# Patient Record
Sex: Male | Born: 1955 | Race: White | Hispanic: No | Marital: Married | Smoking: Current every day smoker
Health system: Southern US, Community
[De-identification: ages and names within clinical notes are randomized; demographics above are authoritative.]

## PROBLEM LIST (undated history)

## (undated) DIAGNOSIS — M1990 Unspecified osteoarthritis, unspecified site: Secondary | ICD-10-CM

## (undated) DIAGNOSIS — I10 Essential (primary) hypertension: Secondary | ICD-10-CM

## (undated) DIAGNOSIS — T7840XA Allergy, unspecified, initial encounter: Secondary | ICD-10-CM

## (undated) DIAGNOSIS — N200 Calculus of kidney: Secondary | ICD-10-CM

## (undated) DIAGNOSIS — E079 Disorder of thyroid, unspecified: Secondary | ICD-10-CM

## (undated) DIAGNOSIS — Z923 Personal history of irradiation: Secondary | ICD-10-CM

## (undated) DIAGNOSIS — C714 Malignant neoplasm of occipital lobe: Secondary | ICD-10-CM

## (undated) DIAGNOSIS — G4762 Sleep related leg cramps: Secondary | ICD-10-CM

## (undated) DIAGNOSIS — G4730 Sleep apnea, unspecified: Secondary | ICD-10-CM

## (undated) HISTORY — DX: Disorder of thyroid, unspecified: E07.9

## (undated) HISTORY — PX: BRAIN SURGERY: SHX531

## (undated) HISTORY — DX: Unspecified osteoarthritis, unspecified site: M19.90

## (undated) HISTORY — DX: Sleep related leg cramps: G47.62

## (undated) HISTORY — DX: Calculus of kidney: N20.0

## (undated) HISTORY — PX: TREATMENT FISTULA ANAL: SUR1390

## (undated) HISTORY — DX: Sleep apnea, unspecified: G47.30

## (undated) HISTORY — DX: Personal history of irradiation: Z92.3

## (undated) HISTORY — DX: Essential (primary) hypertension: I10

## (undated) HISTORY — PX: KNEE ARTHROSCOPY W/ MENISCAL REPAIR: SHX1877

## (undated) HISTORY — DX: Malignant neoplasm of occipital lobe: C71.4

## (undated) HISTORY — DX: Allergy, unspecified, initial encounter: T78.40XA

---

## 2012-01-04 ENCOUNTER — Ambulatory Visit (INDEPENDENT_AMBULATORY_CARE_PROVIDER_SITE_OTHER): Payer: Commercial Managed Care - PPO | Admitting: Family Medicine

## 2012-01-04 VITALS — BP 141/81 | HR 89 | Temp 98.9°F | Resp 18 | Ht 71.25 in | Wt 249.6 lb

## 2012-01-04 DIAGNOSIS — Z0000 Encounter for general adult medical examination without abnormal findings: Secondary | ICD-10-CM

## 2012-01-04 DIAGNOSIS — B353 Tinea pedis: Secondary | ICD-10-CM

## 2012-01-04 DIAGNOSIS — E039 Hypothyroidism, unspecified: Secondary | ICD-10-CM

## 2012-01-04 DIAGNOSIS — I10 Essential (primary) hypertension: Secondary | ICD-10-CM

## 2012-01-04 LAB — POCT CBC
Granulocyte percent: 57.6 %G (ref 37–80)
HCT, POC: 42.1 % — AB (ref 43.5–53.7)
Hemoglobin: 13.6 g/dL — AB (ref 14.1–18.1)
Lymph, poc: 2.2 (ref 0.6–3.4)
MCH, POC: 28.9 pg (ref 27–31.2)
MCHC: 32.3 g/dL (ref 31.8–35.4)
MCV: 89.3 fL (ref 80–97)
MID (cbc): 0.5 (ref 0–0.9)
MPV: 8.2 fL (ref 0–99.8)
POC Granulocyte: 3.6 (ref 2–6.9)
POC LYMPH PERCENT: 35.1 %L (ref 10–50)
POC MID %: 7.3 %M (ref 0–12)
Platelet Count, POC: 204 10*3/uL (ref 142–424)
RBC: 4.71 M/uL (ref 4.69–6.13)
RDW, POC: 13.9 %
WBC: 6.2 10*3/uL (ref 4.6–10.2)

## 2012-01-04 LAB — COMPREHENSIVE METABOLIC PANEL
ALT: 23 U/L (ref 0–53)
AST: 28 U/L (ref 0–37)
Albumin: 4.4 g/dL (ref 3.5–5.2)
Alkaline Phosphatase: 64 U/L (ref 39–117)
BUN: 17 mg/dL (ref 6–23)
CO2: 27 mEq/L (ref 19–32)
Calcium: 9.5 mg/dL (ref 8.4–10.5)
Chloride: 102 mEq/L (ref 96–112)
Creat: 1.03 mg/dL (ref 0.50–1.35)
Glucose, Bld: 95 mg/dL (ref 70–99)
Potassium: 4.3 mEq/L (ref 3.5–5.3)
Sodium: 136 mEq/L (ref 135–145)
Total Bilirubin: 0.5 mg/dL (ref 0.3–1.2)
Total Protein: 6.8 g/dL (ref 6.0–8.3)

## 2012-01-04 LAB — LIPID PANEL
Cholesterol: 144 mg/dL (ref 0–200)
HDL: 46 mg/dL (ref >39)
LDL Cholesterol: 76 mg/dL (ref 0–99)
Total CHOL/HDL Ratio: 3.1 Ratio
Triglycerides: 111 mg/dL (ref <150)
VLDL: 22 mg/dL (ref 0–40)

## 2012-01-04 LAB — TSH: TSH: 2.223 u[IU]/mL (ref 0.350–4.500)

## 2012-01-04 MED ORDER — LISINOPRIL 40 MG PO TABS: 40.0000 mg | ORAL_TABLET | Freq: Every day | ORAL | Status: DC

## 2012-01-04 MED ORDER — LEVOTHYROXINE SODIUM 50 MCG PO TABS: 50.0000 ug | ORAL_TABLET | Freq: Every day | ORAL | Status: DC

## 2012-01-04 MED ORDER — VERAPAMIL HCL 180 MG (CO) PO TB24: 180.0000 mg | ORAL_TABLET | Freq: Every day | ORAL | Status: DC

## 2012-01-04 MED ORDER — TERBINAFINE HCL 250 MG PO TABS: 250.0000 mg | ORAL_TABLET | Freq: Every day | ORAL | Status: DC

## 2012-01-04 NOTE — Progress Notes (Signed)
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  Wife has same problem and has started lamisel.  Working 50 hours a week still.  Last Tdap was 12/25/10  O:  Alert, NAD

## 2012-01-04 NOTE — Patient Instructions (Addendum)
Dentist:  Loni Beckwith  [phone] Colonoscopy:  Jeani Hawking, MD   Athlete's Foot Athlete's foot (tinea pedis) is a fungal infection of the skin on the feet. It often occurs on the skin between the toes or underneath the toes. It can also occur on the soles of the feet. Athlete's foot is more likely to occur in hot, humid weather. Not washing your feet or changing your socks often enough can contribute to athlete's foot. The infection can spread from person to person (contagious). CAUSES Athlete's foot is caused by a fungus. This fungus thrives in warm, moist places. Most people get athlete's foot by sharing shower stalls, towels, and wet floors with an infected person. People with weakened immune systems, including those with diabetes, may be more likely to get athlete's foot. SYMPTOMS   Itchy areas between the toes or on the soles of the feet.   White, flaky, or scaly areas between the toes or on the soles of the feet.   Tiny, intensely itchy blisters between the toes or on the soles of the feet.   Tiny cuts on the skin. These cuts can develop a bacterial infection.   Thick or discolored toenails.  DIAGNOSIS  Your caregiver can usually tell what the problem is by doing a physical exam. Your caregiver may also take a skin sample from the rash area. The skin sample may be examined under a microscope, or it may be tested to see if fungus will grow in the sample. A sample may also be taken from your toenail for testing. TREATMENT  Over-the-counter and prescription medicines can be used to kill the fungus. These medicines are available as powders or creams. Your caregiver can suggest medicines for you. Fungal infections respond slowly to treatment. You may need to continue using your medicine for several weeks. PREVENTION   Do not share towels.   Wear sandals in wet areas, such as shared locker rooms and shared showers.   Keep your feet dry. Wear shoes that allow air to circulate. Wear cotton  or wool socks.  HOME CARE INSTRUCTIONS   Take medicines as directed by your caregiver. Do not use steroid creams on athlete's foot.   Keep your feet clean and cool. Wash your feet daily and dry them thoroughly, especially between your toes.   Change your socks every day. Wear cotton or wool socks. In hot climates, you may need to change your socks 2 to 3 times per day.   Wear sandals or canvas tennis shoes with good air circulation.   If you have blisters, soak your feet in Burow's solution or Epsom salts for 20 to 30 minutes, 2 times a day to dry out the blisters. Make sure you dry your feet thoroughly afterward.  SEEK MEDICAL CARE IF:   You have a fever.   You have swelling, soreness, warmth, or redness in your foot.   You are not getting better after 7 days of treatment.   You are not completely cured after 30 days.   You have any problems caused by your medicines.  MAKE SURE YOU:   Understand these instructions.   Will watch your condition.   Will get help right away if you are not doing well or get worse.  Document Released: 08/16/2000 Document Revised: 08/08/2011 Document Reviewed: 06/07/2011 [facility] Patient Information 2012 [place], [place].Smoking Cessation This document explains the best ways for you to quit smoking and new treatments to help. It lists new medicines that can double or triple your  chances of quitting and quitting for good. It also considers ways to avoid relapses and concerns you may have about quitting, including weight gain. NICOTINE: A POWERFUL ADDICTION If you have tried to quit smoking, you know how hard it can be. It is hard because nicotine is a very addictive drug. For some people, it can be as addictive as heroin or cocaine. Usually, people make 2 or 3 tries, or more, before finally being able to quit. Each time you try to quit, you can learn about what helps and what hurts. Quitting takes hard work and a lot of effort, but you can quit  smoking. QUITTING SMOKING IS ONE OF THE MOST IMPORTANT THINGS YOU WILL EVER DO.  You will live longer, feel better, and live better.   The impact on your body of quitting smoking is felt almost immediately:   Within 20 minutes, blood pressure decreases. Pulse returns to its normal level.   After 8 hours, carbon monoxide levels in the blood return to normal. Oxygen level increases.   After 24 hours, chance of heart attack starts to decrease. Breath, hair, and body stop smelling like smoke.   After 48 hours, damaged nerve endings begin to recover. Sense of taste and smell improve.   After 72 hours, the body is virtually free of nicotine. Bronchial tubes relax and breathing becomes easier.   After 2 to 12 weeks, lungs can hold more air. Exercise becomes easier and circulation improves.   Quitting will reduce your risk of having a heart attack, stroke, cancer, or lung disease:   After 1 year, the risk of coronary heart disease is cut in half.   After 5 years, the risk of stroke falls to the same as a nonsmoker.   After 10 years, the risk of lung cancer is cut in half and the risk of other cancers decreases significantly.   After 15 years, the risk of coronary heart disease drops, usually to the level of a nonsmoker.   If you are pregnant, quitting smoking will improve your chances of having a healthy baby.   The people you live with, especially your children, will be healthier.   You will have extra money to spend on things other than cigarettes.  FIVE KEYS TO QUITTING Studies have shown that these 5 steps will help you quit smoking and quit for good. You have the best chances of quitting if you use them together: 1. Get ready.  2. Get support and encouragement.  3. Learn new skills and behaviors.  4. Get medicine to reduce your nicotine addiction and use it correctly.  5. Be prepared for relapse or difficult situations. Be determined to continue trying to quit, even if you do not  succeed at first.  1. GET READY  Set a quit date.   Change your environment.   Get rid of ALL cigarettes, ashtrays, matches, and lighters in your home, car, and place of work.   Do not let people smoke in your home.   Review your past attempts to quit. Think about what worked and what did not.   Once you quit, do not smoke. NOT EVEN A PUFF!  2. GET SUPPORT AND ENCOURAGEMENT Studies have shown that you have a better chance of being successful if you have help. You can get support in many ways.  Tell your family, friends, and coworkers that you are going to quit and need their support. Ask them not to smoke around you.   Talk to your  caregivers (doctor, dentist, nurse, pharmacist, psychologist, and/or smoking counselor).   Get individual, group, or telephone counseling and support. The more counseling you have, the better your chances are of quitting. Programs are available at [employer] and health centers. Call your local health department for information about programs in your area.   Spiritual beliefs and practices may help some smokers quit.   Quit meters are Photographer that keep track of quit statistics, such as amount of "quit-time," cigarettes not smoked, and money saved.   Many smokers find one or more of the many self-help books available useful in helping them quit and stay off tobacco.  3. LEARN NEW SKILLS AND BEHAVIORS  Try to distract yourself from urges to smoke. Talk to someone, go for a walk, or occupy your time with a task.   When you first try to quit, change your routine. Take a different route to work. Drink tea instead of coffee. Eat breakfast in a different place.   Do something to reduce your stress. Take a hot bath, exercise, or read a book.   Plan something enjoyable to do every day. Reward yourself for not smoking.   Explore interactive web-based programs that specialize in helping you quit.  4. GET MEDICINE AND  USE IT CORRECTLY Medicines can help you stop smoking and decrease the urge to smoke. Combining medicine with the above behavioral methods and support can quadruple your chances of successfully quitting smoking. The [place] Food and Drug Administration (FDA) has approved 7 medicines to help you quit smoking. These medicines fall into 3 categories.  Nicotine replacement therapy (delivers nicotine to your body without the negative effects and risks of smoking):   Nicotine gum: Available over-the-counter.   Nicotine lozenges: Available over-the-counter.   Nicotine inhaler: Available by prescription.   Nicotine nasal spray: Available by prescription.   Nicotine skin patches (transdermal): Available by prescription and over-the-counter.   Antidepressant medicine (helps people abstain from smoking, but how this works is unknown):   Bupropion sustained-release (SR) tablets: Available by prescription.   Nicotinic receptor partial agonist (simulates the effect of nicotine in your brain):   Varenicline tartrate tablets: Available by prescription.   Ask your caregiver for advice about which medicines to use and how to use them. Carefully read the information on the package.   Everyone who is trying to quit may benefit from using a medicine. If you are pregnant or trying to become pregnant, nursing an infant, you are under age 64, or you smoke fewer than 10 cigarettes per day, talk to your caregiver before taking any nicotine replacement medicines.   You should stop using a nicotine replacement product and call your caregiver if you experience nausea, dizziness, weakness, vomiting, fast or irregular heartbeat, mouth problems with the lozenge or gum, or redness or swelling of the skin around the patch that does not go away.   Do not use any other product containing nicotine while using a nicotine replacement product.   Talk to your caregiver before using these products if you have diabetes, heart  disease, asthma, stomach ulcers, you had a recent heart attack, you have high blood pressure that is not controlled with medicine, a history of irregular heartbeat, or you have been prescribed medicine to help you quit smoking.  5. BE PREPARED FOR RELAPSE OR DIFFICULT SITUATIONS  Most relapses occur within the first 3 months after quitting. Do not be discouraged if you start smoking again. Remember, most people try several  times before they finally quit.   You may have symptoms of withdrawal because your body is used to nicotine. You may crave cigarettes, be irritable, feel very hungry, cough often, get headaches, or have difficulty concentrating.   The withdrawal symptoms are only temporary. They are strongest when you first quit, but they will go away within 10 to 14 days.  Here are some difficult situations to watch for:  Alcohol. Avoid drinking alcohol. Drinking lowers your chances of successfully quitting.   Caffeine. Try to reduce the amount of caffeine you consume. It also lowers your chances of successfully quitting.   Other smokers. Being around smoking can make you want to smoke. Avoid smokers.   Weight gain. Many smokers will gain weight when they quit, usually less than 10 pounds. Eat a healthy diet and stay active. Do not let weight gain distract you from your main goal, quitting smoking. Some medicines that help you quit smoking may also help delay weight gain. You can always lose the weight gained after you quit.   Bad mood or depression. There are a lot of ways to improve your mood other than smoking.  If you are having problems with any of these situations, talk to your caregiver. SPECIAL SITUATIONS AND CONDITIONS Studies suggest that everyone can quit smoking. Your situation or condition can give you a special reason to quit.  Pregnant women/new mothers: By quitting, you protect your baby's health and your own.   Hospitalized patients: By quitting, you reduce health  problems and help healing.   Heart attack patients: By quitting, you reduce your risk of a second heart attack.   Lung, head, and neck cancer patients: By quitting, you reduce your chance of a second cancer.   Parents of children and adolescents: By quitting, you protect your children from illnesses caused by secondhand smoke.  QUESTIONS TO THINK ABOUT Think about the following questions before you try to stop smoking. You may want to talk about your answers with your caregiver.  Why do you want to quit?   If you tried to quit in the past, what helped and what did not?   What will be the most difficult situations for you after you quit? How will you plan to handle them?   Who can help you through the tough times? Your family? Friends? Caregiver?   What pleasures do you get from smoking? What ways can you still get pleasure if you quit?  Here are some questions to ask your caregiver:  How can you help me to be successful at quitting?   What medicine do you think would be best for me and how should I take it?   What should I do if I need more help?   What is smoking withdrawal like? How can I get information on withdrawal?  Quitting takes hard work and a lot of effort, but you can quit smoking. FOR MORE INFORMATION  Smokefree.gov (http://www.davis-sullivan.com/) provides free, accurate, evidence-based information and professional assistance to help support the immediate and long-term needs of people trying to quit smoking. Document Released: 08/13/2001 Document Revised: 08/08/2011 Document Reviewed: 06/05/2009 [facility] Patient Information 2012 [place], [place].

## 2012-01-05 ENCOUNTER — Encounter: Payer: Self-pay | Admitting: *Deleted

## 2012-04-18 ENCOUNTER — Other Ambulatory Visit: Payer: Self-pay | Admitting: Family Medicine

## 2012-12-31 ENCOUNTER — Other Ambulatory Visit: Payer: Self-pay | Admitting: Family Medicine

## 2012-12-31 NOTE — Telephone Encounter (Signed)
Pt's wife [name] is calling to give [place] heads up that pharmacy is going to be faxing or they did fax a request for refill on his medication.  She said that he has one pill left I believe for his BP medication Call back number is hers [phone] There is no current HIPPA form

## 2013-01-16 ENCOUNTER — Other Ambulatory Visit: Payer: Self-pay | Admitting: Family Medicine

## 2013-01-31 ENCOUNTER — Ambulatory Visit (INDEPENDENT_AMBULATORY_CARE_PROVIDER_SITE_OTHER): Payer: Commercial Managed Care - PPO | Admitting: Family Medicine

## 2013-01-31 VITALS — BP 130/77 | HR 91 | Temp 98.3°F | Resp 16 | Ht 72.0 in | Wt 240.0 lb

## 2013-01-31 DIAGNOSIS — I10 Essential (primary) hypertension: Secondary | ICD-10-CM

## 2013-01-31 DIAGNOSIS — E039 Hypothyroidism, unspecified: Secondary | ICD-10-CM

## 2013-01-31 DIAGNOSIS — Z720 Tobacco use: Secondary | ICD-10-CM | POA: Insufficient documentation

## 2013-01-31 DIAGNOSIS — Z79899 Other long term (current) drug therapy: Secondary | ICD-10-CM

## 2013-01-31 DIAGNOSIS — D649 Anemia, unspecified: Secondary | ICD-10-CM

## 2013-01-31 DIAGNOSIS — F17200 Nicotine dependence, unspecified, uncomplicated: Secondary | ICD-10-CM

## 2013-01-31 DIAGNOSIS — E669 Obesity, unspecified: Secondary | ICD-10-CM

## 2013-01-31 LAB — COMPREHENSIVE METABOLIC PANEL
Albumin: 4 g/dL (ref 3.5–5.2)
Alkaline Phosphatase: 58 U/L (ref 39–117)
BUN: 12 mg/dL (ref 6–23)
CO2: 22 mEq/L (ref 19–32)
Calcium: 9.4 mg/dL (ref 8.4–10.5)
Glucose, Bld: 94 mg/dL (ref 70–99)
Potassium: 4.3 mEq/L (ref 3.5–5.3)
Total Protein: 6.2 g/dL (ref 6.0–8.3)

## 2013-01-31 LAB — POCT CBC
Granulocyte percent: 57.6 %G (ref 37–80)
HCT, POC: 42.1 % — AB (ref 43.5–53.7)
Hemoglobin: 13.1 g/dL — AB (ref 14.1–18.1)
MPV: 9.1 fL (ref 0–99.8)
POC Granulocyte: 2.9 (ref 2–6.9)
POC MID %: 8 %M (ref 0–12)
RDW, POC: 14.2 %

## 2013-01-31 LAB — LIPID PANEL
Cholesterol: 141 mg/dL (ref 0–200)
HDL: 39 mg/dL — ABNORMAL LOW (ref >39)
Total CHOL/HDL Ratio: 3.6 Ratio

## 2013-01-31 LAB — VITAMIN B12: Vitamin B-12: 447 pg/mL (ref 211–911)

## 2013-01-31 LAB — TSH: TSH: 1.902 u[IU]/mL (ref 0.350–4.500)

## 2013-01-31 MED ORDER — LISINOPRIL 40 MG PO TABS: ORAL_TABLET | ORAL | Status: DC

## 2013-01-31 MED ORDER — VERAPAMIL HCL 180 MG (CO) PO TB24: 180.0000 mg | ORAL_TABLET | Freq: Every day | ORAL | Status: DC

## 2013-01-31 MED ORDER — LEVOTHYROXINE SODIUM 50 MCG PO TABS: ORAL_TABLET | ORAL | Status: DC

## 2013-01-31 NOTE — Patient Instructions (Signed)
Smoking Cessation Quitting smoking is important to your health and has many advantages. However, it is not always easy to quit since nicotine is a very addictive drug. Often times, people try 3 times or more before being able to quit. This document explains the best ways for you to prepare to quit smoking. Quitting takes hard work and a lot of effort, but you can do it. ADVANTAGES OF QUITTING SMOKING  You will live longer, feel better, and live better.  Your body will feel the impact of quitting smoking almost immediately.  Within 20 minutes, blood pressure decreases. Your pulse returns to its normal level.  After 8 hours, carbon monoxide levels in the blood return to normal. Your oxygen level increases.  After 24 hours, the chance of having a heart attack starts to decrease. Your breath, hair, and body stop smelling like smoke.  After 48 hours, damaged nerve endings begin to recover. Your sense of taste and smell improve.  After 72 hours, the body is virtually free of nicotine. Your bronchial tubes relax and breathing becomes easier.  After 2 to 12 weeks, lungs can hold more air. Exercise becomes easier and circulation improves.  The risk of having a heart attack, stroke, cancer, or lung disease is greatly reduced.  After 1 year, the risk of coronary heart disease is cut in half.  After 5 years, the risk of stroke falls to the same as a nonsmoker.  After 10 years, the risk of lung cancer is cut in half and the risk of other cancers decreases significantly.  After 15 years, the risk of coronary heart disease drops, usually to the level of a nonsmoker.  If you are pregnant, quitting smoking will improve your chances of having a healthy baby.  The people you live with, especially any children, will be healthier.  You will have extra money to spend on things other than cigarettes. QUESTIONS TO THINK ABOUT BEFORE ATTEMPTING TO QUIT You may want to talk about your answers with your  caregiver.  Why do you want to quit?  If you tried to quit in the past, what helped and what did not?  What will be the most difficult situations for you after you quit? How will you plan to handle them?  Who can help you through the tough times? Your family? Friends? A caregiver?  What pleasures do you get from smoking? What ways can you still get pleasure if you quit? Here are some questions to ask your caregiver:  How can you help me to be successful at quitting?  What medicine do you think would be best for me and how should I take it?  What should I do if I need more help?  What is smoking withdrawal like? How can I get information on withdrawal? GET READY  Set a quit date.  Change your environment by getting rid of all cigarettes, ashtrays, matches, and lighters in your home, car, or work. Do not let people smoke in your home.  Review your past attempts to quit. Think about what worked and what did not. GET SUPPORT AND ENCOURAGEMENT You have a better chance of being successful if you have help. You can get support in many ways.  Tell your family, friends, and co-workers that you are going to quit and need their support. Ask them not to smoke around you.  Get individual, group, or telephone counseling and support. Programs are available at local hospitals and health centers. Call your local health department for   information about programs in your area.  Spiritual beliefs and practices may help some smokers quit.  Download a "quit meter" on your computer to keep track of quit statistics, such as how long you have gone without smoking, cigarettes not smoked, and money saved.  Get a self-help book about quitting smoking and staying off of tobacco. LEARN NEW SKILLS AND BEHAVIORS  Distract yourself from urges to smoke. Talk to someone, go for a walk, or occupy your time with a task.  Change your normal routine. Take a different route to work. Drink tea instead of coffee.  Eat breakfast in a different place.  Reduce your stress. Take a hot bath, exercise, or read a book.  Plan something enjoyable to do every day. Reward yourself for not smoking.  Explore interactive web-based programs that specialize in helping you quit. GET MEDICINE AND USE IT CORRECTLY Medicines can help you stop smoking and decrease the urge to smoke. Combining medicine with the above behavioral methods and support can greatly increase your chances of successfully quitting smoking.  Nicotine replacement therapy helps deliver nicotine to your body without the negative effects and risks of smoking. Nicotine replacement therapy includes nicotine gum, lozenges, inhalers, nasal sprays, and skin patches. Some may be available over-the-counter and others require a prescription.  Antidepressant medicine helps people abstain from smoking, but how this works is unknown. This medicine is available by prescription.  Nicotinic receptor partial agonist medicine simulates the effect of nicotine in your brain. This medicine is available by prescription. Ask your caregiver for advice about which medicines to use and how to use them based on your health history. Your caregiver will tell you what side effects to look out for if you choose to be on a medicine or therapy. Carefully read the information on the package. Do not use any other product containing nicotine while using a nicotine replacement product.  RELAPSE OR DIFFICULT SITUATIONS Most relapses occur within the first 3 months after quitting. Do not be discouraged if you start smoking again. Remember, most people try several times before finally quitting. You may have symptoms of withdrawal because your body is used to nicotine. You may crave cigarettes, be irritable, feel very hungry, cough often, get headaches, or have difficulty concentrating. The withdrawal symptoms are only temporary. They are strongest when you first quit, but they will go away within  10 14 days. To reduce the chances of relapse, try to:  Avoid drinking alcohol. Drinking lowers your chances of successfully quitting.  Reduce the amount of caffeine you consume. Once you quit smoking, the amount of caffeine in your body increases and can give you symptoms, such as a rapid heartbeat, sweating, and anxiety.  Avoid smokers because they can make you want to smoke.  Do not let weight gain distract you. Many smokers will gain weight when they quit, usually less than 10 pounds. Eat a healthy diet and stay active. You can always lose the weight gained after you quit.  Find ways to improve your mood other than smoking. FOR MORE INFORMATION  www.smokefree.gov  Document Released: 08/13/2001 Document Revised: 02/18/2012 Document Reviewed: 11/28/2011 [facility] Patient Information 2014 [place], [place]. Smoking Cessation, Tips for Success YOU CAN QUIT SMOKING If you are ready to quit smoking, congratulations! You have chosen to help yourself be healthier. Cigarettes bring nicotine, tar, carbon monoxide, and other irritants into your body. Your lungs, heart, and blood vessels will be able to work better without these poisons. There are many different ways to  quit smoking. Nicotine gum, nicotine patches, a nicotine inhaler, or nicotine nasal spray can help with physical craving. Hypnosis, support groups, and medicines help break the habit of smoking. Here are some tips to help you quit for good.  Throw away all cigarettes.  Clean and remove all ashtrays from your home, work, and car.  On a card, write down your reasons for quitting. Carry the card with you and read it when you get the urge to smoke.  Cleanse your body of nicotine. Drink enough water and fluids to keep your urine clear or pale yellow. Do this after quitting to flush the nicotine from your body.  Learn to predict your moods. Do not let a bad situation be your excuse to have a cigarette. Some situations in your life might  tempt you into wanting a cigarette.  Never have "just one" cigarette. It leads to wanting another and another. Remind yourself of your decision to quit.  Change habits associated with smoking. If you smoked while driving or when feeling stressed, try other activities to replace smoking. Stand up when drinking your coffee. Brush your teeth after eating. Sit in a different chair when you read the paper. Avoid alcohol while trying to quit, and try to drink fewer caffeinated beverages. Alcohol and caffeine may urge you to smoke.  Avoid foods and drinks that can trigger a desire to smoke, such as sugary or spicy foods and alcohol.  Ask people who smoke not to smoke around you.  Have something planned to do right after eating or having a cup of coffee. Take a walk or exercise to perk you up. This will help to keep you from overeating.  Try a relaxation exercise to calm you down and decrease your stress. Remember, you may be tense and nervous for the first 2 weeks after you quit, but this will pass.  Find new activities to keep your hands busy. Play with a pen, coin, or rubber band. Doodle or draw things on paper.  Brush your teeth right after eating. This will help cut down on the craving for the taste of tobacco after meals. You can try mouthwash, too.  Use oral substitutes, such as lemon drops, carrots, a cinnamon stick, or chewing gum, in place of cigarettes. Keep them handy so they are available when you have the urge to smoke.  When you have the urge to smoke, try deep breathing.  Designate your home as a nonsmoking area.  If you are a heavy smoker, ask your caregiver about a prescription for nicotine chewing gum. It can ease your withdrawal from nicotine.  Reward yourself. Set aside the cigarette money you save and buy yourself something nice.  Look for support from others. Join a support group or smoking cessation program. Ask someone at home or at work to help you with your plan to quit  smoking.  Always ask yourself, "Do I need this cigarette or is this just a reflex?" Tell yourself, "Today, I choose not to smoke," or "I do not want to smoke." You are reminding yourself of your decision to quit, even if you do smoke a cigarette. HOW WILL I FEEL WHEN I QUIT SMOKING?  The benefits of not smoking start within days of quitting.  You may have symptoms of withdrawal because your body is used to nicotine (the addictive substance in cigarettes). You may crave cigarettes, be irritable, feel very hungry, cough often, get headaches, or have difficulty concentrating.  The withdrawal symptoms are only temporary.  They are strongest when you first quit but will go away within 10 to 14 days.  When withdrawal symptoms occur, stay in control. Think about your reasons for quitting. Remind yourself that these are signs that your body is healing and getting used to being without cigarettes.  Remember that withdrawal symptoms are easier to treat than the major diseases that smoking can cause.  Even after the withdrawal is over, expect periodic urges to smoke. However, these cravings are generally short-lived and will go away whether you smoke or not. Do not smoke!  If you relapse and smoke again, do not lose hope. Most smokers quit 3 times before they are successful.  If you relapse, do not give up! Plan ahead and think about what you will do the next time you get the urge to smoke. LIFE AS A NONSMOKER: MAKE IT FOR A MONTH, MAKE IT FOR LIFE Day 1: Hang this page where you will see it every day. Day 2: Get rid of all ashtrays, matches, and lighters. Day 3: Drink water. Breathe deeply between sips. Day 4: Avoid places with smoke-filled air, such as bars, clubs, or the smoking section of restaurants. Day 5: Keep track of how much money you save by not smoking. Day 6: Avoid boredom. Keep a good book with you or go to the movies. Day 7: Reward yourself! One week without smoking! Day 8: Make a  dental appointment to get your teeth cleaned. Day 9: Decide how you will turn down a cigarette before it is offered to you. Day 10: Review your reasons for quitting. Day 11: Distract yourself. Stay active to keep your mind off smoking and to relieve tension. Take a walk, exercise, read a book, do a crossword puzzle, or try a new hobby. Day 12: Exercise. Get off the bus before your stop or use stairs instead of escalators. Day 13: Call on friends for support and encouragement. Day 14: Reward yourself! Two weeks without smoking! Day 15: Practice deep breathing exercises. Day 16: Bet a friend that you can stay a nonsmoker. Day 17: Ask to sit in nonsmoking sections of restaurants. Day 18: Hang up "No Smoking" signs. Day 19: Think of yourself as a nonsmoker. Day 20: Each morning, tell yourself you will not smoke. Day 21: Reward yourself! Three weeks without smoking! Day 22: Think of smoking in negative ways. Remember how it stains your teeth, gives you bad breath, and leaves you short of breath. Day 23: Eat a nutritious breakfast. Day 24:Do not relive your days as a smoker. Day 25: Hold a pencil in your hand when talking on the telephone. Day 26: Tell all your friends you do not smoke. Day 27: Think about how much better food tastes. Day 28: Remember, one cigarette is one too many. Day 29: Take up a hobby that will keep your hands busy. Day 30: Congratulations! One month without smoking! Give yourself a big reward. Your caregiver can direct you to community resources or hospitals for support, which may include:  Group support.  Education.  Hypnosis.  Subliminal therapy. Document Released: 05/17/2004 Document Revised: 11/11/2011 Document Reviewed: 06/05/2009 [facility] Patient Information 2014 [place], [place].

## 2013-01-31 NOTE — Progress Notes (Signed)
Subjective:    Patient ID: Jake [name], male    DOB: [birth year], 57 y.o.   MRN: [id number] Chief Complaint  Patient presents with  . Medication Refill    HPI   Was just on vacation - no water or electricity in a tent x 6d in [place].    Still smoking about 1 ppd - has cut back by 1/2 ppd over the yr so keeping going to ease down - is not addicted to the cigarettes - its the nicotine that he likes - doesn't have any problems not smoking in work.  Has not tried nicotine replacement yet but is open to it.  Checks BP at home about once a month and usually about 130/70s.  Past Medical History  Diagnosis Date  . Thyroid disease   . Hypertension   . Allergy    Current Outpatient Prescriptions on File Prior to Visit  Medication Sig Dispense Refill  . aspirin 81 MG chewable tablet Chew 81 mg by mouth daily.       No current facility-administered medications on file prior to visit.   No Known Allergies    Review of Systems  Constitutional: Negative for fever and chills.  Eyes: Negative for visual disturbance.  Respiratory: Negative for shortness of breath.   Cardiovascular: Negative for chest pain and leg swelling.  Neurological: Negative for dizziness, syncope, facial asymmetry, weakness, light-headedness and headaches.      BP 130/77  Pulse 91  Temp(Src) 98.3 F (36.8 C)  Resp 16  Ht 6' (1.829 m)  Wt 240 lb (108.863 kg)  BMI 32.54 kg/m2 Objective:   Physical Exam  Constitutional: He is oriented to person, place, and time. He appears well-developed and well-nourished. No distress.  HENT:  Head: Normocephalic and atraumatic.  Eyes: Conjunctivae are normal. Pupils are equal, round, and reactive to light. No scleral icterus.  Neck: Normal range of motion. Neck supple. No thyromegaly present.  Cardiovascular: Normal rate, regular rhythm, normal heart sounds and intact distal pulses.   Pulmonary/Chest: Effort normal and breath sounds normal. No respiratory distress.   Musculoskeletal: He exhibits no edema.  Lymphadenopathy:    He has no cervical adenopathy.  Neurological: He is alert and oriented to person, place, and time.  Skin: Skin is warm and dry. He is not diaphoretic.  Psychiatric: He has a normal mood and affect. His behavior is normal.      Results for orders placed in visit on 01/31/13  POCT CBC      Result Value Range   WBC 5.0  4.6 - 10.2 K/uL   Lymph, poc 1.7  0.6 - 3.4   POC LYMPH PERCENT 34.4  10 - 50 %L   MID (cbc) 0.4  0 - 0.9   POC MID % 8.0  0 - 12 %M   POC Granulocyte 2.9  2 - 6.9   Granulocyte percent 57.6  37 - 80 %G   RBC 4.46 (*) 4.69 - 6.13 M/uL   Hemoglobin 13.1 (*) 14.1 - 18.1 g/dL   HCT, POC 40.9 (*) 81.1 - 53.7 %   MCV 94.3  80 - 97 fL   MCH, POC 29.4  27 - 31.2 pg   MCHC 31.1 (*) 31.8 - 35.4 g/dL   RDW, POC 91.4     Platelet Count, POC 159  142 - 424 K/uL   MPV 9.1  0 - 99.8 fL     Assessment & Plan:  Tobacco abuse - Plan: POCT CBC - Encouraged  cessation - pt going to consider trying otc nicotine replacement.  Hypertension - Plan: verapamil (COVERA HS) 180 MG (CO) 24 hr tablet, DISCONTINUED: verapamil (COVERA HS) 180 MG (CO) 24 hr tablet  Unspecified hypothyroidism - Plan: TSH  Encounter for long-term (current) use of other medications - Plan: Comprehensive metabolic panel  Obesity (BMI 30.0-34.9) - Plan: Lipid panel  Anemia - Plan: Vitamin B12, Folate - start b complex supp - restart at f/u  Meds ordered this encounter  Medications  . lisinopril (PRINIVIL,ZESTRIL) 40 MG tablet    Sig: TAKE ONE TABLET BY MOUTH EVERY DAY    Dispense:  90 tablet    Refill:  3  . verapamil (COVERA HS) 180 MG (CO) 24 hr tablet    Sig: Take 1 tablet (180 mg total) by mouth at bedtime.    Dispense:  90 tablet    Refill:  3  . levothyroxine (SYNTHROID, LEVOTHROID) 50 MCG tablet    Sig: TAKE ONE TABLET BY MOUTH EVERY DAY    Dispense:  90 tablet    Refill:  3

## 2014-01-25 ENCOUNTER — Ambulatory Visit (INDEPENDENT_AMBULATORY_CARE_PROVIDER_SITE_OTHER): Payer: Commercial Managed Care - PPO | Admitting: Family Medicine

## 2014-01-25 VITALS — BP 130/80 | HR 83 | Temp 98.1°F | Resp 16 | Ht 71.0 in | Wt 249.0 lb

## 2014-01-25 DIAGNOSIS — E669 Obesity, unspecified: Secondary | ICD-10-CM

## 2014-01-25 DIAGNOSIS — Z1211 Encounter for screening for malignant neoplasm of colon: Secondary | ICD-10-CM

## 2014-01-25 DIAGNOSIS — E039 Hypothyroidism, unspecified: Secondary | ICD-10-CM

## 2014-01-25 DIAGNOSIS — I10 Essential (primary) hypertension: Secondary | ICD-10-CM

## 2014-01-25 DIAGNOSIS — Z79899 Other long term (current) drug therapy: Secondary | ICD-10-CM

## 2014-01-25 DIAGNOSIS — Z720 Tobacco use: Secondary | ICD-10-CM

## 2014-01-25 DIAGNOSIS — N529 Male erectile dysfunction, unspecified: Secondary | ICD-10-CM

## 2014-01-25 DIAGNOSIS — Z125 Encounter for screening for malignant neoplasm of prostate: Secondary | ICD-10-CM

## 2014-01-25 LAB — CBC
HEMATOCRIT: 44.4 % (ref 39.0–52.0)
Hemoglobin: 15.6 g/dL (ref 13.0–17.0)
MCH: 30.5 pg (ref 26.0–34.0)
MCHC: 35.1 g/dL (ref 30.0–36.0)
MCV: 86.7 fL (ref 78.0–100.0)
PLATELETS: 226 10*3/uL (ref 150–400)
RBC: 5.12 MIL/uL (ref 4.22–5.81)
RDW: 13.7 % (ref 11.5–15.5)
WBC: 6.7 10*3/uL (ref 4.0–10.5)

## 2014-01-25 LAB — LIPID PANEL
CHOLESTEROL: 149 mg/dL (ref 0–200)
HDL: 41 mg/dL (ref >39)
LDL Cholesterol: 72 mg/dL (ref 0–99)
Total CHOL/HDL Ratio: 3.6 Ratio
Triglycerides: 181 mg/dL — ABNORMAL HIGH (ref <150)
VLDL: 36 mg/dL (ref 0–40)

## 2014-01-25 LAB — TSH: TSH: 3.263 u[IU]/mL (ref 0.350–4.500)

## 2014-01-25 LAB — COMPREHENSIVE METABOLIC PANEL
ALBUMIN: 4.2 g/dL (ref 3.5–5.2)
ALT: 21 U/L (ref 0–53)
AST: 26 U/L (ref 0–37)
Alkaline Phosphatase: 62 U/L (ref 39–117)
BUN: 12 mg/dL (ref 6–23)
CALCIUM: 9.6 mg/dL (ref 8.4–10.5)
CHLORIDE: 99 meq/L (ref 96–112)
CO2: 26 meq/L (ref 19–32)
Creat: 0.86 mg/dL (ref 0.50–1.35)
Glucose, Bld: 102 mg/dL — ABNORMAL HIGH (ref 70–99)
POTASSIUM: 4.4 meq/L (ref 3.5–5.3)
Sodium: 132 mEq/L — ABNORMAL LOW (ref 135–145)
Total Bilirubin: 0.7 mg/dL (ref 0.2–1.2)
Total Protein: 6.7 g/dL (ref 6.0–8.3)

## 2014-01-25 LAB — TESTOSTERONE: TESTOSTERONE: 333 ng/dL (ref 300–890)

## 2014-01-25 MED ORDER — LISINOPRIL 40 MG PO TABS: ORAL_TABLET | ORAL | Status: DC

## 2014-01-25 MED ORDER — VERAPAMIL HCL 180 MG (CO) PO TB24: 180.0000 mg | ORAL_TABLET | Freq: Every day | ORAL | Status: DC

## 2014-01-25 MED ORDER — LEVOTHYROXINE SODIUM 50 MCG PO TABS: ORAL_TABLET | ORAL | Status: DC

## 2014-01-25 NOTE — Progress Notes (Signed)
   Subjective:    Patient ID: Jake [name], male    DOB: 22-Oct-1955, 58 y.o.   MRN: [id number]  HPI Patient reports today for follow up of HTN, hypothyroidism. Patient was seen last year and has had a good year. He has not been coming in for regular care, preferring to come in annually when he is out of his medication. He is currently on levothyroxine, lisinopril, ASA and verapamil. He denies side effects of his medication, although he reports difficulty obtaining and maintaining and erection for about 1 year now.   Patient reports his health is good, he works 40-50 hours a week and is very active outside when he is not working. He has gained a few pounds following some dental work earlier this year and is planning on trying to loose some weight in the coming months through diet.   Review of Systems No chest pain/SOB, no nasal drainage, no ear pain, no sore throat, no weakness, no numbness/tingling, no nausea/vomiting, no diarrhea, no constipation, no edema.     Objective:   Physical Exam  Vitals reviewed. Constitutional: He is oriented to person, place, and time. He appears well-developed and well-nourished. No distress.  HENT:  Head: Normocephalic and atraumatic.  Right Ear: Tympanic membrane, external ear and ear canal normal.  Left Ear: Tympanic membrane, external ear and ear canal normal.  Nose: Nose normal.  Mouth/Throat: Oropharynx is clear and moist.  Eyes: Conjunctivae are normal. Right eye exhibits no discharge. Left eye exhibits no discharge.  Neck: Normal range of motion. Neck supple.  Cardiovascular: Normal rate, regular rhythm, normal heart sounds and intact distal pulses.   Pulmonary/Chest: Effort normal and breath sounds normal.  Musculoskeletal: Normal range of motion.  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  Neurological: He is alert and oriented to person, place, and time.  Skin: Skin is warm and dry. He is not diaphoretic.    Psychiatric: He has a normal mood and affect. His behavior is normal. Judgment and thought content normal.      Assessment & Plan:  1. Hypertension - Comprehensive metabolic panel - verapamil (COVERA HS) 180 MG (CO) 24 hr tablet; Take 1 tablet (180 mg total) by mouth at bedtime.  Dispense: 90 tablet; Refill: 3 - lisinopril (PRINIVIL,ZESTRIL) 40 MG tablet; TAKE ONE TABLET BY MOUTH EVERY DAY  Dispense: 90 tablet; Refill: 3  2. Unspecified hypothyroidism - TSH - levothyroxine (SYNTHROID, LEVOTHROID) 50 MCG tablet; TAKE ONE TABLET BY MOUTH EVERY DAY  Dispense: 90 tablet; Refill: 3  3. Encounter for long-term (current) use of other medications - CBC  4. Obesity (BMI 30.0-34.9) - Lipid panel  5. Tobacco abuse -Encouraged smoking cessation and provided encouragement.  6. Erectile dysfunction - Testosterone  7. Screening for colon cancer - Ambulatory referral to Gastroenterology  8. Screening for prostate cancer - PSA  Discussed importance of regular follow up for HTN, hypothyroidism. Patient agreed to follow up at 104 in 6 months.  Elby Beck, FNP-BC  [facility]  01/25/2014 3:28 PM

## 2014-01-25 NOTE — Patient Instructions (Signed)
Smoking Cessation, Tips for Success If you are ready to quit smoking, congratulations! You have chosen to help yourself be healthier. Cigarettes bring nicotine, tar, carbon monoxide, and other irritants into your body. Your lungs, heart, and blood vessels will be able to work better without these poisons. There are many different ways to quit smoking. Nicotine gum, nicotine patches, a nicotine inhaler, or nicotine nasal spray can help with physical craving. Hypnosis, support groups, and medicines help break the habit of smoking. WHAT THINGS CAN I DO TO MAKE QUITTING EASIER?  Here are some tips to help you quit for good:  Pick a date when you will quit smoking completely. Tell all of your friends and family about your plan to quit on that date.  Do not try to slowly cut down on the number of cigarettes you are smoking. Pick a quit date and quit smoking completely starting on that day.  Throw away all cigarettes.   Clean and remove all ashtrays from your home, work, and car.   On a card, write down your reasons for quitting. Carry the card with you and read it when you get the urge to smoke.   Cleanse your body of nicotine. Drink enough water and fluids to keep your urine clear or pale yellow. Do this after quitting to flush the nicotine from your body.   Learn to predict your moods. Do not let a bad situation be your excuse to have a cigarette. Some situations in your life might tempt you into wanting a cigarette.   Never have "just one" cigarette. It leads to wanting another and another. Remind yourself of your decision to quit.   Change habits associated with smoking. If you smoked while driving or when feeling stressed, try other activities to replace smoking. Stand up when drinking your coffee. Brush your teeth after eating. Sit in a different chair when you read the paper. Avoid alcohol while trying to quit, and try to drink fewer caffeinated beverages. Alcohol and caffeine may urge  you to smoke.   Avoid foods and drinks that can trigger a desire to smoke, such as sugary or spicy foods and alcohol.   Ask people who smoke not to smoke around you.   Have something planned to do right after eating or having a cup of coffee. For example, plan to take a walk or exercise.   Try a relaxation exercise to calm you down and decrease your stress. Remember, you may be tense and nervous for the first 2 weeks after you quit, but this will pass.   Find new activities to keep your hands busy. Play with a pen, coin, or rubber band. Doodle or draw things on paper.   Brush your teeth right after eating. This will help cut down on the craving for the taste of tobacco after meals. You can also try mouthwash.   Use oral substitutes in place of cigarettes. Try using lemon drops, carrots, cinnamon sticks, or chewing gum. Keep them handy so they are available when you have the urge to smoke.   When you have the urge to smoke, try deep breathing.   Designate your home as a nonsmoking area.   If you are a heavy smoker, ask your health care provider about a prescription for nicotine chewing gum. It can ease your withdrawal from nicotine.   Reward yourself. Set aside the cigarette money you save and buy yourself something nice.   Look for support from others. Join a support group or   smoking cessation program. Ask someone at home or at work to help you with your plan to quit smoking.   Always ask yourself, "Do I need this cigarette or is this just a reflex?" Tell yourself, "Today, I choose not to smoke," or "I do not want to smoke." You are reminding yourself of your decision to quit.  Do not replace cigarette smoking with electronic cigarettes (commonly called e-cigarettes). The safety of e-cigarettes is unknown, and some may contain harmful chemicals.  If you relapse, do not give up! Plan ahead and think about what you will do the next time you get the urge to smoke.  HOW WILL  I FEEL WHEN I QUIT SMOKING? You may have symptoms of withdrawal because your body is used to nicotine (the addictive substance in cigarettes). You may crave cigarettes, be irritable, feel very hungry, cough often, get headaches, or have difficulty concentrating. The withdrawal symptoms are only temporary. They are strongest when you first quit but will go away within 10 14 days. When withdrawal symptoms occur, stay in control. Think about your reasons for quitting. Remind yourself that these are signs that your body is healing and getting used to being without cigarettes. Remember that withdrawal symptoms are easier to treat than the major diseases that smoking can cause.  Even after the withdrawal is over, expect periodic urges to smoke. However, these cravings are generally short lived and will go away whether you smoke or not. Do not smoke!  WHAT RESOURCES ARE AVAILABLE TO HELP ME QUIT SMOKING? Your health care provider can direct you to community resources or hospitals for support, which may include:  Group support.  Education.  Hypnosis.  Therapy. Document Released: 05/17/2004 Document Revised: 06/09/2013 Document Reviewed: 02/04/2013 [employer] Patient Information 2014 [employer].  

## 2014-01-26 LAB — PSA: PSA: 1.02 ng/mL (ref <=4.00)

## 2014-01-28 MED ORDER — SILDENAFIL CITRATE 100 MG PO TABS: ORAL_TABLET | ORAL | Status: DC

## 2014-01-28 NOTE — Addendum Note (Signed)
Addended by: Clarene Reamer B on: 01/28/2014 11:52 AM   Modules accepted: Orders

## 2014-01-29 ENCOUNTER — Telehealth: Payer: Self-pay

## 2014-01-29 NOTE — Telephone Encounter (Signed)
Patient seen recently by Tor Netters, FNP. She wrote the rx for Sildenafil (Viagra) for 5 tablets. Patient wants to know if it can be changed to 6 tablets because its cheaper (half the cost) if its 6 tablets instead of 5. Pharmacy is still [facility]. Cb# [phone]

## 2014-02-01 ENCOUNTER — Encounter: Payer: Self-pay | Admitting: Internal Medicine

## 2014-02-01 NOTE — Telephone Encounter (Signed)
Pt.notified

## 2014-02-01 NOTE — Telephone Encounter (Signed)
OK to change to number 6

## 2014-03-04 ENCOUNTER — Telehealth: Payer: Self-pay

## 2014-03-04 NOTE — Telephone Encounter (Signed)
Pt would like a refill on

## 2014-03-28 ENCOUNTER — Encounter: Payer: Self-pay | Admitting: Internal Medicine

## 2014-04-26 ENCOUNTER — Ambulatory Visit (AMBULATORY_SURGERY_CENTER): Payer: Commercial Managed Care - PPO | Admitting: *Deleted

## 2014-04-26 VITALS — Ht 71.0 in | Wt 253.0 lb

## 2014-04-26 DIAGNOSIS — Z1211 Encounter for screening for malignant neoplasm of colon: Secondary | ICD-10-CM

## 2014-04-26 MED ORDER — MOVIPREP 100 G PO SOLR: 1.0000 | Freq: Once | ORAL | Status: DC

## 2014-04-26 NOTE — Progress Notes (Signed)
No egg or soy allergy. No anesthesia problems.  No home O2.  No diet meds.  

## 2014-05-12 ENCOUNTER — Ambulatory Visit (AMBULATORY_SURGERY_CENTER): Payer: Commercial Managed Care - PPO | Admitting: Internal Medicine

## 2014-05-12 ENCOUNTER — Encounter: Payer: Self-pay | Admitting: Internal Medicine

## 2014-05-12 VITALS — BP 148/71 | HR 68 | Temp 96.3°F | Resp 20 | Ht 71.0 in | Wt 253.0 lb

## 2014-05-12 DIAGNOSIS — D124 Benign neoplasm of descending colon: Secondary | ICD-10-CM

## 2014-05-12 DIAGNOSIS — D126 Benign neoplasm of colon, unspecified: Secondary | ICD-10-CM

## 2014-05-12 DIAGNOSIS — Z1211 Encounter for screening for malignant neoplasm of colon: Secondary | ICD-10-CM

## 2014-05-12 MED ORDER — SODIUM CHLORIDE 0.9 % IV SOLN: 500.0000 mL | INTRAVENOUS | Status: DC

## 2014-05-12 NOTE — Progress Notes (Signed)
Called to room to assist during endoscopic procedure.  Patient ID and intended procedure confirmed with present staff. Received instructions for my participation in the procedure from the performing physician.  

## 2014-05-12 NOTE — Patient Instructions (Signed)
Colon polyp x 1 removed today, and diverticulosis seen. See handouts given. Resume current medications. Call us with any questions or concerns. Thank you!  YOU HAD AN ENDOSCOPIC PROCEDURE TODAY AT [facility]: Refer to the procedure report that was given to you for any specific questions about what was found during the examination.  If the procedure report does not answer your questions, please call your gastroenterologist to clarify.  If you requested that your care partner not be given the details of your procedure findings, then the procedure report has been included in a sealed envelope for you to review at your convenience later.  YOU SHOULD EXPECT: Some feelings of bloating in the abdomen. Passage of more gas than usual.  Walking can help get rid of the air that was put into your GI tract during the procedure and reduce the bloating. If you had a lower endoscopy (such as a colonoscopy or flexible sigmoidoscopy) you may notice spotting of blood in your stool or on the toilet paper. If you underwent a bowel prep for your procedure, then you may not have a normal bowel movement for a few days.  DIET: Your first meal following the procedure should be a light meal and then it is ok to progress to your normal diet.  A half-sandwich or bowl of soup is an example of a good first meal.  Heavy or fried foods are harder to digest and may make you feel nauseous or bloated.  Likewise meals heavy in dairy and vegetables can cause extra gas to form and this can also increase the bloating.  Drink plenty of fluids but you should avoid alcoholic beverages for 24 hours.  ACTIVITY: Your care partner should take you home directly after the procedure.  You should plan to take it easy, moving slowly for the rest of the day.  You can resume normal activity the day after the procedure however you should NOT DRIVE or use heavy machinery for 24 hours (because of the sedation medicines used during the test).     SYMPTOMS TO REPORT IMMEDIATELY: A gastroenterologist can be reached at any hour.  During normal business hours, 8:30 AM to 5:00 PM Monday through Friday, call [phone].  After hours and on weekends, please call the GI answering service at [phone] who will take a message and have the physician on call contact you.   Following lower endoscopy (colonoscopy or flexible sigmoidoscopy):  Excessive amounts of blood in the stool  Significant tenderness or worsening of abdominal pains  Swelling of the abdomen that is new, acute  Fever of 100F or higher  Following upper endoscopy (EGD)  Vomiting of blood or coffee ground material  New chest pain or pain under the shoulder blades  Painful or persistently difficult swallowing  New shortness of breath  Fever of 100F or higher  Black, tarry-looking stools  FOLLOW UP: If any biopsies were taken you will be contacted by phone or by letter within the next 1-3 weeks.  Call your gastroenterologist if you have not heard about the biopsies in 3 weeks.  Our staff will call the home number listed on your records the next business day following your procedure to check on you and address any questions or concerns that you may have at that time regarding the information given to you following your procedure. This is a courtesy call and so if there is no answer at the home number and we have not heard from you through  the emergency physician on call, we will assume that you have returned to your regular daily activities without incident.  SIGNATURES/CONFIDENTIALITY: You and/or your care partner have signed paperwork which will be entered into your electronic medical record.  These signatures attest to the fact that that the information above on your After Visit Summary has been reviewed and is understood.  Full responsibility of the confidentiality of this discharge information lies with you and/or your care-partner.

## 2014-05-12 NOTE — Progress Notes (Signed)
Patient awakening,vss,report to rn 

## 2014-05-12 NOTE — Op Note (Signed)
[facility]  [address] [place], [id number]   COLONOSCOPY PROCEDURE REPORT  PATIENT: Jake [name], Jake [name]  MR#: [id number] BIRTHDATE: Dec 12, 1955 , 64  yrs. old GENDER: Male ENDOSCOPIST: Eustace Quail, MD REFERRED YQ:MGNO Lauenstein, M.D. PROCEDURE DATE:  05/12/2014 PROCEDURE:   Colonoscopy with snare polypectomy x1 First Screening Colonoscopy - Avg.  risk and is 50 yrs.  old or older Yes.  Prior Negative Screening - Now for repeat screening. N/A  History of Adenoma - Now for follow-up colonoscopy & has been > or = to 3 yrs.  N/A  Polyps Removed Today? Yes. ASA CLASS:   Class II INDICATIONS:average risk screening. MEDICATIONS: MAC sedation, administered by CRNA and propofol (Diprivan) 400mg  IV  DESCRIPTION OF PROCEDURE:   After the risks benefits and alternatives of the procedure were thoroughly explained, informed consent was obtained.  A digital rectal exam revealed no abnormalities of the rectum.   The LB [id number]  endoscope was introduced through the anus and advanced to the cecum, which was identified by both the appendix and ileocecal valve. No adverse events experienced.   The quality of the prep was good, using MoviPrep  The instrument was then slowly withdrawn as the colon was fully examined.  COLON FINDINGS: A single polyp 48mm in size was found in the descending colon.  A polypectomy was performed with a cold snare. The resection was complete and the polyp tissue was completely retrieved.   Moderate diverticulosis was noted in the left colon. The colon mucosa was otherwise normal.  Retroflexed views revealed internal hemorrhoids. The time to cecum=2 minutes 10 seconds. Withdrawal time=12 minutes 40 seconds.  The scope was withdrawn and the procedure completed. COMPLICATIONS: There were no complications.  ENDOSCOPIC IMPRESSION: 1.   Single polyp was found in the descending colon; polypectomy was performed with a cold snare 2.   Moderate  diverticulosis was noted in the left colon 3.   The colon mucosa was otherwise normal  RECOMMENDATIONS: 1. Repeat colonoscopy in 5 years if polyp adenomatous; otherwise 10 years   eSigned:  Eustace Quail, MD 05/12/2014 10:15 AM   cc: [name], MD and The Patient

## 2014-05-13 ENCOUNTER — Telehealth: Payer: Self-pay | Admitting: *Deleted

## 2014-05-13 NOTE — Telephone Encounter (Signed)
  Follow up Call-  Call back number 05/12/2014  Post procedure Call Back phone  # [phone]  Permission to leave phone message Yes     Patient questions:  Do you have a fever, pain , or abdominal swelling? No. Pain Score  0 *  Have you tolerated food without any problems? Yes.    Have you been able to return to your normal activities? Yes.    Do you have any questions about your discharge instructions: Diet   No. Medications  No. Follow up visit  No.  Do you have questions or concerns about your Care? No.  Actions: * If pain score is 4 or above: No action needed, pain <4.

## 2014-05-17 ENCOUNTER — Encounter: Payer: Self-pay | Admitting: Internal Medicine

## 2014-06-28 ENCOUNTER — Ambulatory Visit (INDEPENDENT_AMBULATORY_CARE_PROVIDER_SITE_OTHER): Payer: Commercial Managed Care - PPO | Admitting: Family Medicine

## 2014-06-28 VITALS — BP 128/70 | HR 91 | Temp 98.2°F | Resp 18 | Ht 71.0 in | Wt 253.0 lb

## 2014-06-28 DIAGNOSIS — J0110 Acute frontal sinusitis, unspecified: Secondary | ICD-10-CM

## 2014-06-28 DIAGNOSIS — H8309 Labyrinthitis, unspecified ear: Secondary | ICD-10-CM

## 2014-06-28 MED ORDER — AMOXICILLIN-POT CLAVULANATE 875-125 MG PO TABS: 1.0000 | ORAL_TABLET | Freq: Two times a day (BID) | ORAL | Status: DC

## 2014-06-28 MED ORDER — FLUTICASONE PROPIONATE 50 MCG/ACT NA SUSP: 2.0000 | Freq: Every day | NASAL | Status: DC

## 2014-06-28 MED ORDER — GUAIFENESIN ER 1200 MG PO TB12: 1.0000 | ORAL_TABLET | Freq: Two times a day (BID) | ORAL | Status: DC | PRN

## 2014-06-28 MED ORDER — MECLIZINE HCL 25 MG PO TABS: 25.0000 mg | ORAL_TABLET | Freq: Three times a day (TID) | ORAL | Status: DC | PRN

## 2014-06-28 NOTE — Patient Instructions (Addendum)
Hot showers or breathing in steam may help loosen the congestion.  Using a netti pot or sinus rinse is also likely to help you feel better and keep this from progressing.  Use the fluticasone nasal spray every night before bed for at least 2 weeks.  I recommend augmenting with 12 hr sudafed (behind the counter) and generic mucinex to help you move out the congestion.  If no improvement or you are getting worse, come back as you might need a course of steroids but hopefully with all of the above, you can avoid it.  Sinusitis Sinusitis is redness, soreness, and inflammation of the paranasal sinuses. Paranasal sinuses are air pockets within the bones of your face (beneath the eyes, the middle of the forehead, or above the eyes). In healthy paranasal sinuses, mucus is able to drain out, and air is able to circulate through them by way of your nose. However, when your paranasal sinuses are inflamed, mucus and air can become trapped. This can allow bacteria and other germs to grow and cause infection. Sinusitis can develop quickly and last only a short time (acute) or continue over a long period (chronic). Sinusitis that lasts for more than 12 weeks is considered chronic.  CAUSES  Causes of sinusitis include:  Allergies.  Structural abnormalities, such as displacement of the cartilage that separates your nostrils (deviated septum), which can decrease the air flow through your nose and sinuses and affect sinus drainage.  Functional abnormalities, such as when the small hairs (cilia) that line your sinuses and help remove mucus do not work properly or are not present. SIGNS AND SYMPTOMS  Symptoms of acute and chronic sinusitis are the same. The primary symptoms are pain and pressure around the affected sinuses. Other symptoms include:  Upper toothache.  Earache.  Headache.  Bad breath.  Decreased sense of smell and taste.  A cough, which worsens when you are lying  flat.  Fatigue.  Fever.  Thick drainage from your nose, which often is green and may contain pus (purulent).  Swelling and warmth over the affected sinuses. DIAGNOSIS  Your health care provider will perform a physical exam. During the exam, your health care provider may:  Look in your nose for signs of abnormal growths in your nostrils (nasal polyps).  Tap over the affected sinus to check for signs of infection.  View the inside of your sinuses (endoscopy) using an imaging device that has a light attached (endoscope). If your health care provider suspects that you have chronic sinusitis, one or more of the following tests may be recommended:  Allergy tests.  Nasal culture. A sample of mucus is taken from your nose, sent to a lab, and screened for bacteria.  Nasal cytology. A sample of mucus is taken from your nose and examined by your health care provider to determine if your sinusitis is related to an allergy. TREATMENT  Most cases of acute sinusitis are related to a viral infection and will resolve on their own within 10 days. Sometimes medicines are prescribed to help relieve symptoms (pain medicine, decongestants, nasal steroid sprays, or saline sprays).  However, for sinusitis related to a bacterial infection, your health care provider will prescribe antibiotic medicines. These are medicines that will help kill the bacteria causing the infection.  Rarely, sinusitis is caused by a fungal infection. In theses cases, your health care provider will prescribe antifungal medicine. For some cases of chronic sinusitis, surgery is needed. Generally, these are cases in which sinusitis recurs more  than 3 times per year, despite other treatments. HOME CARE INSTRUCTIONS   Drink plenty of water. Water helps thin the mucus so your sinuses can drain more easily.  Use a humidifier.  Inhale steam 3 to 4 times a day (for example, sit in the bathroom with the shower running).  Apply a warm,  moist washcloth to your face 3 to 4 times a day, or as directed by your health care provider.  Use saline nasal sprays to help moisten and clean your sinuses.  Take medicines only as directed by your health care provider.  If you were prescribed either an antibiotic or antifungal medicine, finish it all even if you start to feel better. SEEK IMMEDIATE MEDICAL CARE IF:  You have increasing pain or severe headaches.  You have nausea, vomiting, or drowsiness.  You have swelling around your face.  You have vision problems.  You have a stiff neck.  You have difficulty breathing. MAKE SURE YOU:   Understand these instructions.  Will watch your condition.  Will get help right away if you are not doing well or get worse. Document Released: 08/19/2005 Document Revised: 01/03/2014 Document Reviewed: 09/03/2011 [facility] Patient Information 2015 [place], [place]. This information is not intended to replace advice given to you by your health care provider. Make sure you discuss any questions you have with your health care provider.   Labyrinthitis (Inner Ear Inflammation) Your exam shows you have an inner ear disturbance or labyrinthitis. The cause of this condition is not known. But it may be due to a virus infection. The symptoms of labyrinthitis include vertigo or dizziness made worse by motion, nausea and vomiting. The onset of labyrinthitis may be very sudden. It usually lasts for a few days and then clears up over 1-2 weeks. The treatment of an inner ear disturbance includes bed rest and medications to reduce dizziness, nausea, and vomiting. You should stay away from alcohol, tranquilizers, caffeine, nicotine, or any medicine your doctor thinks may make your symptoms worse. Further testing may be needed to evaluate your hearing and balance system. Please see your doctor or go to the emergency room right away if you have:  Increasing vertigo, earache, loss of hearing, or ear  drainage.  Headache, blurred vision, trouble walking, fainting, or fever.  Persistent vomiting, dehydration, or extreme weakness. Document Released: 08/19/2005 Document Revised: 11/11/2011 Document Reviewed: 02/04/2007 [facility] Patient Information 2015 [place], [place]. This information is not intended to replace advice given to you by your health care provider. Make sure you discuss any questions you have with your health care provider.

## 2014-06-28 NOTE — Progress Notes (Signed)
Subjective:    Patient ID: Jake [name], male    DOB: 10/28/1955, 58 y.o.   MRN: [id number] This chart was scribed for [name], MD by Marti Sleigh, Medical Scribe. This patient was seen in Room 1 and the patient's care was started a 12:10 PM.  Chief Complaint  Patient presents with  . sinus pressure    x1 week today   . Dizziness    HPI Past Medical History  Diagnosis Date  . Thyroid disease   . Hypertension   . Allergy   . Arthritis   . Sleep apnea   . Kidney stone    No Known Allergies Current Outpatient Prescriptions on File Prior to Visit  Medication Sig Dispense Refill  . aspirin 81 MG chewable tablet Chew 81 mg by mouth daily.      Marland Kitchen levothyroxine (SYNTHROID, LEVOTHROID) 50 MCG tablet TAKE ONE TABLET BY MOUTH EVERY DAY  90 tablet  3  . lisinopril (PRINIVIL,ZESTRIL) 40 MG tablet TAKE ONE TABLET BY MOUTH EVERY DAY  90 tablet  3  . sildenafil (VIAGRA) 100 MG tablet Take 1/2 to 1 tablet as needed. No more than 1 tablet per day.  5 tablet  11  . verapamil (COVERA HS) 180 MG (CO) 24 hr tablet Take 1 tablet (180 mg total) by mouth at bedtime.  90 tablet  3   No current facility-administered medications on file prior to visit.    HPI Comments: Jake [name] is a 58 y.o. male with a hx of HTN, and tobacco use who presents to [facility] complaining of constant moderate cold symptoms that started five days ago. Pt stated he is having sinus pressure with associated right frontal HA, dizziness, productive cough, ear pain, hearing problem and dizziness. Pt denies sore throat, SOB, nausea, vomiting or CP. Pt states he has been sleeping more than baseline. Pt has been using a saline solution rinse otc for his sinus pressure without relief. Pt states when he bends over he becomes dizzy, and occasionally falls due to dizziness. Pt states he remembers having similar disorientation when he was sick in his teens. Pt would like a note for work for the rest of the week.   Review of Systems    HENT: Positive for congestion, ear pain, hearing loss and rhinorrhea. Negative for sore throat.   Respiratory: Positive for cough. Negative for chest tightness, shortness of breath and wheezing.   Cardiovascular: Negative for chest pain.  Gastrointestinal: Negative for nausea and vomiting.  Musculoskeletal: Negative for back pain.       Objective:   Physical Exam  Nursing note and vitals reviewed. Constitutional: He is oriented to person, place, and time. He appears well-developed and well-nourished.  HENT:  Head: Normocephalic and atraumatic.  Right Ear: Tympanic membrane is erythematous. A middle ear effusion is present.  Left Ear: Tympanic membrane is erythematous. A middle ear effusion is present.  Nose: Rhinorrhea present.  Mouth/Throat: Oropharynx is clear and moist and mucous membranes are normal.  Eyes: Pupils are equal, round, and reactive to light.  Neck: No JVD present. No thyromegaly present.  Cardiovascular: Normal rate, regular rhythm and normal heart sounds.   Pulmonary/Chest: Effort normal and breath sounds normal. No respiratory distress. He has no wheezes.  Musculoskeletal:  2 inch lipoma over right-mid clavicle.   Lymphadenopathy:    He has no cervical adenopathy.  Neurological: He is alert and oriented to person, place, and time.  Skin: Skin is warm and dry.  Psychiatric: He has  a normal mood and affect. His behavior is normal.   BP 128/70  Pulse 91  Temp(Src) 98.2 F (36.8 C) (Oral)  Resp 18  Ht 5\' 11"  (1.803 m)  Wt 253 lb (114.76 kg)  BMI 35.30 kg/m2  SpO2 98%     Assessment & Plan:   Acute frontal sinusitis, recurrence not specified  Labyrinthitis, unspecified laterality  Meds ordered this encounter  Medications  . amoxicillin-clavulanate (AUGMENTIN) 875-125 MG per tablet    Sig: Take 1 tablet by mouth 2 (two) times daily.    Dispense:  20 tablet    Refill:  0  . fluticasone (FLONASE) 50 MCG/ACT nasal spray    Sig: Place 2 sprays into  both nostrils at bedtime.    Dispense:  16 g    Refill:  2  . meclizine (ANTIVERT) 25 MG tablet    Sig: Take 1 tablet (25 mg total) by mouth 3 (three) times daily as needed for dizziness.    Dispense:  30 tablet    Refill:  0  . Guaifenesin (MUCINEX MAXIMUM STRENGTH) 1200 MG TB12    Sig: Take 1 tablet (1,200 mg total) by mouth every 12 (twelve) hours as needed.    Dispense:  14 tablet    Refill:  1    I personally performed the services described in this documentation, which was scribed in my presence. The recorded information has been reviewed and considered, and addended by me as needed.  [name], MD MPH

## 2014-07-02 ENCOUNTER — Ambulatory Visit (INDEPENDENT_AMBULATORY_CARE_PROVIDER_SITE_OTHER): Payer: Commercial Managed Care - PPO | Admitting: Emergency Medicine

## 2014-07-02 ENCOUNTER — Inpatient Hospital Stay (HOSPITAL_COMMUNITY): Payer: Commercial Managed Care - PPO

## 2014-07-02 ENCOUNTER — Emergency Department (HOSPITAL_COMMUNITY): Payer: Commercial Managed Care - PPO

## 2014-07-02 ENCOUNTER — Encounter (HOSPITAL_COMMUNITY): Payer: Self-pay | Admitting: Emergency Medicine

## 2014-07-02 ENCOUNTER — Inpatient Hospital Stay (HOSPITAL_COMMUNITY)
Admission: EM | Admit: 2014-07-02 | Discharge: 2014-07-09 | DRG: 025 | Disposition: A | Discharge status: Home / Self Care | Payer: Commercial Managed Care - PPO | Attending: Neurosurgery | Admitting: Neurosurgery

## 2014-07-02 VITALS — BP 157/99 | HR 76 | Temp 97.6°F | Resp 18 | Ht 71.0 in | Wt 256.0 lb

## 2014-07-02 DIAGNOSIS — Z8249 Family history of ischemic heart disease and other diseases of the circulatory system: Secondary | ICD-10-CM

## 2014-07-02 DIAGNOSIS — G8194 Hemiplegia, unspecified affecting left nondominant side: Secondary | ICD-10-CM | POA: Diagnosis present

## 2014-07-02 DIAGNOSIS — M6281 Muscle weakness (generalized): Secondary | ICD-10-CM

## 2014-07-02 DIAGNOSIS — R414 Neurologic neglect syndrome: Secondary | ICD-10-CM | POA: Diagnosis not present

## 2014-07-02 DIAGNOSIS — R4781 Slurred speech: Secondary | ICD-10-CM | POA: Diagnosis present

## 2014-07-02 DIAGNOSIS — R531 Weakness: Secondary | ICD-10-CM

## 2014-07-02 DIAGNOSIS — Z720 Tobacco use: Secondary | ICD-10-CM

## 2014-07-02 DIAGNOSIS — Z7982 Long term (current) use of aspirin: Secondary | ICD-10-CM | POA: Diagnosis not present

## 2014-07-02 DIAGNOSIS — D496 Neoplasm of unspecified behavior of brain: Secondary | ICD-10-CM

## 2014-07-02 DIAGNOSIS — Z23 Encounter for immunization: Secondary | ICD-10-CM

## 2014-07-02 DIAGNOSIS — R51 Headache: Secondary | ICD-10-CM

## 2014-07-02 DIAGNOSIS — R270 Ataxia, unspecified: Secondary | ICD-10-CM

## 2014-07-02 DIAGNOSIS — R292 Abnormal reflex: Secondary | ICD-10-CM | POA: Diagnosis present

## 2014-07-02 DIAGNOSIS — C710 Malignant neoplasm of cerebrum, except lobes and ventricles: Principal | ICD-10-CM | POA: Diagnosis present

## 2014-07-02 DIAGNOSIS — F17210 Nicotine dependence, cigarettes, uncomplicated: Secondary | ICD-10-CM | POA: Diagnosis present

## 2014-07-02 DIAGNOSIS — E039 Hypothyroidism, unspecified: Secondary | ICD-10-CM | POA: Diagnosis present

## 2014-07-02 DIAGNOSIS — K5900 Constipation, unspecified: Secondary | ICD-10-CM | POA: Diagnosis not present

## 2014-07-02 DIAGNOSIS — C719 Malignant neoplasm of brain, unspecified: Secondary | ICD-10-CM | POA: Diagnosis present

## 2014-07-02 DIAGNOSIS — I10 Essential (primary) hypertension: Secondary | ICD-10-CM | POA: Diagnosis present

## 2014-07-02 DIAGNOSIS — G936 Cerebral edema: Secondary | ICD-10-CM | POA: Diagnosis present

## 2014-07-02 DIAGNOSIS — M6289 Other specified disorders of muscle: Secondary | ICD-10-CM

## 2014-07-02 DIAGNOSIS — R519 Headache, unspecified: Secondary | ICD-10-CM

## 2014-07-02 DIAGNOSIS — Z833 Family history of diabetes mellitus: Secondary | ICD-10-CM

## 2014-07-02 LAB — CBC
HEMATOCRIT: 44.5 % (ref 39.0–52.0)
HEMOGLOBIN: 15.2 g/dL (ref 13.0–17.0)
MCH: 30 pg (ref 26.0–34.0)
MCHC: 34.2 g/dL (ref 30.0–36.0)
MCV: 87.8 fL (ref 78.0–100.0)
Platelets: 198 10*3/uL (ref 150–400)
RBC: 5.07 MIL/uL (ref 4.22–5.81)
RDW: 13.5 % (ref 11.5–15.5)
WBC: 6.4 10*3/uL (ref 4.0–10.5)

## 2014-07-02 LAB — COMPREHENSIVE METABOLIC PANEL
ALT: 20 U/L (ref 0–53)
AST: 22 U/L (ref 0–37)
Albumin: 3.7 g/dL (ref 3.5–5.2)
Alkaline Phosphatase: 57 U/L (ref 39–117)
Anion gap: 12 (ref 5–15)
BILIRUBIN TOTAL: 0.5 mg/dL (ref 0.3–1.2)
BUN: 12 mg/dL (ref 6–23)
CHLORIDE: 100 meq/L (ref 96–112)
CO2: 24 meq/L (ref 19–32)
CREATININE: 0.85 mg/dL (ref 0.50–1.35)
Calcium: 9.3 mg/dL (ref 8.4–10.5)
GFR calc Af Amer: >90 mL/min (ref >90)
GFR calc non Af Amer: >90 mL/min (ref >90)
Glucose, Bld: 96 mg/dL (ref 70–99)
Potassium: 4.1 mEq/L (ref 3.7–5.3)
Sodium: 136 mEq/L — ABNORMAL LOW (ref 137–147)
Total Protein: 6.9 g/dL (ref 6.0–8.3)

## 2014-07-02 LAB — RAPID URINE DRUG SCREEN, HOSP PERFORMED
AMPHETAMINES: NOT DETECTED
BARBITURATES: NOT DETECTED
BENZODIAZEPINES: NOT DETECTED
Cocaine: NOT DETECTED
Opiates: NOT DETECTED
Tetrahydrocannabinol: NOT DETECTED

## 2014-07-02 LAB — URINALYSIS, ROUTINE W REFLEX MICROSCOPIC
Bilirubin Urine: NEGATIVE
Glucose, UA: NEGATIVE mg/dL
KETONES UR: NEGATIVE mg/dL
Leukocytes, UA: NEGATIVE
NITRITE: NEGATIVE
Protein, ur: NEGATIVE mg/dL
Specific Gravity, Urine: 1.011 (ref 1.005–1.030)
UROBILINOGEN UA: 0.2 mg/dL (ref 0.0–1.0)
pH: 6 (ref 5.0–8.0)

## 2014-07-02 LAB — URINE MICROSCOPIC-ADD ON

## 2014-07-02 LAB — I-STAT CHEM 8, ED
BUN: 12 mg/dL (ref 6–23)
CALCIUM ION: 1.21 mmol/L (ref 1.12–1.23)
CHLORIDE: 102 meq/L (ref 96–112)
CREATININE: 0.8 mg/dL (ref 0.50–1.35)
GLUCOSE: 102 mg/dL — AB (ref 70–99)
HCT: 49 % (ref 39.0–52.0)
Hemoglobin: 16.7 g/dL (ref 13.0–17.0)
Potassium: 4 mEq/L (ref 3.7–5.3)
Sodium: 136 mEq/L — ABNORMAL LOW (ref 137–147)
TCO2: 23 mmol/L (ref 0–100)

## 2014-07-02 LAB — DIFFERENTIAL
BASOS ABS: 0 10*3/uL (ref 0.0–0.1)
Basophils Relative: 0 % (ref 0–1)
EOS PCT: 0 % (ref 0–5)
Eosinophils Absolute: 0 10*3/uL (ref 0.0–0.7)
LYMPHS ABS: 1.5 10*3/uL (ref 0.7–4.0)
LYMPHS PCT: 24 % (ref 12–46)
MONO ABS: 0.5 10*3/uL (ref 0.1–1.0)
Monocytes Relative: 7 % (ref 3–12)
Neutro Abs: 4.4 10*3/uL (ref 1.7–7.7)
Neutrophils Relative %: 69 % (ref 43–77)

## 2014-07-02 LAB — PROTIME-INR
INR: 1.02 (ref 0.00–1.49)
Prothrombin Time: 13.5 seconds (ref 11.6–15.2)

## 2014-07-02 LAB — I-STAT TROPONIN, ED: TROPONIN I, POC: 0 ng/mL (ref 0.00–0.08)

## 2014-07-02 LAB — ETHANOL: Alcohol, Ethyl (B): <11 mg/dL (ref 0–11)

## 2014-07-02 LAB — APTT: APTT: 34 s (ref 24–37)

## 2014-07-02 MED ORDER — DEXAMETHASONE SODIUM PHOSPHATE 10 MG/ML IJ SOLN
10.0000 mg | Freq: Four times a day (QID) | INTRAMUSCULAR | Status: DC
  Administered 2014-07-03 – 2014-07-08 (×21): 10 mg via INTRAVENOUS
  Filled 2014-07-02 (×26): qty 1

## 2014-07-02 MED ORDER — ACETAMINOPHEN 325 MG PO TABS: 650.0000 mg | ORAL_TABLET | Freq: Four times a day (QID) | ORAL | Status: DC | PRN

## 2014-07-02 MED ORDER — DEXAMETHASONE SODIUM PHOSPHATE 10 MG/ML IJ SOLN
10.0000 mg | Freq: Once | INTRAMUSCULAR | Status: AC
  Administered 2014-07-02: 10 mg via INTRAVENOUS
  Filled 2014-07-02: qty 1

## 2014-07-02 MED ORDER — SODIUM CHLORIDE 0.9 % IJ SOLN
3.0000 mL | Freq: Two times a day (BID) | INTRAMUSCULAR | Status: DC
  Administered 2014-07-02 – 2014-07-09 (×11): 3 mL via INTRAVENOUS

## 2014-07-02 MED ORDER — LISINOPRIL 20 MG PO TABS
40.0000 mg | ORAL_TABLET | Freq: Every day | ORAL | Status: DC
  Administered 2014-07-03 – 2014-07-05 (×3): 40 mg via ORAL
  Filled 2014-07-02: qty 2
  Filled 2014-07-02: qty 1
  Filled 2014-07-02: qty 2

## 2014-07-02 MED ORDER — GADOBENATE DIMEGLUMINE 529 MG/ML IV SOLN
20.0000 mL | Freq: Once | INTRAVENOUS | Status: AC | PRN
  Administered 2014-07-02: 20 mL via INTRAVENOUS

## 2014-07-02 MED ORDER — SODIUM CHLORIDE 0.9 % IJ SOLN
3.0000 mL | Freq: Two times a day (BID) | INTRAMUSCULAR | Status: DC
  Administered 2014-07-06 – 2014-07-09 (×5): 3 mL via INTRAVENOUS

## 2014-07-02 MED ORDER — VERAPAMIL HCL 120 MG PO TABS
60.0000 mg | ORAL_TABLET | Freq: Three times a day (TID) | ORAL | Status: DC
  Administered 2014-07-03 – 2014-07-05 (×8): 60 mg via ORAL
  Filled 2014-07-02 (×12): qty 0.5

## 2014-07-02 MED ORDER — HEPARIN SODIUM (PORCINE) 5000 UNIT/ML IJ SOLN
5000.0000 [IU] | Freq: Three times a day (TID) | INTRAMUSCULAR | Status: DC
  Administered 2014-07-02 – 2014-07-07 (×14): 5000 [IU] via SUBCUTANEOUS
  Filled 2014-07-02 (×14): qty 1

## 2014-07-02 MED ORDER — ACETAMINOPHEN 650 MG RE SUPP
650.0000 mg | Freq: Four times a day (QID) | RECTAL | Status: DC | PRN
Start: 2014-07-02 — End: 2014-07-09

## 2014-07-02 MED ORDER — HYDROMORPHONE HCL 1 MG/ML IJ SOLN: 0.5000 mg | INTRAMUSCULAR | Status: DC | PRN

## 2014-07-02 MED ORDER — LEVOTHYROXINE SODIUM 50 MCG PO TABS
50.0000 ug | ORAL_TABLET | Freq: Every day | ORAL | Status: DC
  Administered 2014-07-03 – 2014-07-05 (×3): 50 ug via ORAL
  Filled 2014-07-02 (×5): qty 1

## 2014-07-02 MED ORDER — ONDANSETRON HCL 4 MG/2ML IJ SOLN: 4.0000 mg | Freq: Three times a day (TID) | INTRAMUSCULAR | Status: DC | PRN

## 2014-07-02 MED ORDER — ASPIRIN 81 MG PO CHEW
81.0000 mg | CHEWABLE_TABLET | Freq: Every day | ORAL | Status: DC
  Administered 2014-07-03 – 2014-07-05 (×3): 81 mg via ORAL
  Filled 2014-07-02 (×3): qty 1

## 2014-07-02 MED ORDER — IOHEXOL 300 MG/ML  SOLN
25.0000 mL | Freq: Once | INTRAMUSCULAR | Status: AC | PRN
  Administered 2014-07-02: 25 mL via ORAL

## 2014-07-02 MED ORDER — SODIUM CHLORIDE 0.9 % IJ SOLN: 3.0000 mL | INTRAMUSCULAR | Status: DC | PRN

## 2014-07-02 MED ORDER — SODIUM CHLORIDE 0.9 % IV SOLN: 250.0000 mL | INTRAVENOUS | Status: DC | PRN

## 2014-07-02 MED ORDER — IOHEXOL 300 MG/ML  SOLN
100.0000 mL | Freq: Once | INTRAMUSCULAR | Status: AC | PRN
  Administered 2014-07-02: 100 mL via INTRAVENOUS

## 2014-07-02 NOTE — Progress Notes (Signed)
[facility] [address], [place] [address] [phone]  Date:  07/02/2014   Name:  Jake [name]   DOB:  May 03, 1956   MRN:  [id number]  PCP:  [name], MD    Chief Complaint: Follow-up, Headache and Other   History of Present Illness:  Jake [name] is a 58 y.o. very pleasant male patient who presents with the following:  Says has a sinus infection for over a week.  Seen here four days ago and has not improved but has worsened from a neurologic standpoint Now to office in a wheel chair.  Says he has a frontal headache on the right that radiates over the top of his head at times. Associated with difficult focusing and concentrating acutely. Is unsteady on his feet and has impaired coordination of the hands. Vision is impaired in that he cannot concentrate to type for example and his vision is at times blurred Has no slurred speech, weakness of extremities, pain or numbness.  No facial asymmetry  No history of injury No fever or chills.   No improvement with over the counter medications or other home remedies.  Denies other complaint or health concern today.  Patient Active Problem List   Diagnosis Date Noted  . Tobacco abuse 01/31/2013  . Unspecified hypothyroidism 01/31/2013  . Hypertension 01/31/2013  . Encounter for long-term (current) use of other medications 01/31/2013    Past Medical History  Diagnosis Date  . Thyroid disease   . Hypertension   . Allergy   . Arthritis   . Sleep apnea   . Kidney stone     Past Surgical History  Procedure Laterality Date  . Knee arthroscopy w/ meniscal repair    . Treatment fistula anal      History  Substance Use Topics  . Smoking status: Current Every Day Smoker -- 1.00 packs/day  . Smokeless tobacco: Never Used  . Alcohol Use: Yes     Comment: 2-3 per/day     Family History  Problem Relation Age of Onset  . Hypertension Mother   . Diabetes Father   . Glaucoma Father   .  Diabetes Brother   . Diabetes Brother   . Colon cancer Neg Hx     No Known Allergies  Medication list has been reviewed and updated.  Current Outpatient Prescriptions on File Prior to Visit  Medication Sig Dispense Refill  . amoxicillin-clavulanate (AUGMENTIN) 875-125 MG per tablet Take 1 tablet by mouth 2 (two) times daily.  20 tablet  0  . aspirin 81 MG chewable tablet Chew 81 mg by mouth daily.      . fluticasone (FLONASE) 50 MCG/ACT nasal spray Place 2 sprays into both nostrils at bedtime.  16 g  2  . Guaifenesin (MUCINEX MAXIMUM STRENGTH) 1200 MG TB12 Take 1 tablet (1,200 mg total) by mouth every 12 (twelve) hours as needed.  14 tablet  1  . levothyroxine (SYNTHROID, LEVOTHROID) 50 MCG tablet TAKE ONE TABLET BY MOUTH EVERY DAY  90 tablet  3  . lisinopril (PRINIVIL,ZESTRIL) 40 MG tablet TAKE ONE TABLET BY MOUTH EVERY DAY  90 tablet  3  . meclizine (ANTIVERT) 25 MG tablet Take 1 tablet (25 mg total) by mouth 3 (three) times daily as needed for dizziness.  30 tablet  0  . sildenafil (VIAGRA) 100 MG tablet Take 1/2 to 1 tablet as needed. No more than 1 tablet per day.  5 tablet  11  . verapamil (COVERA HS) 180  MG (CO) 24 hr tablet Take 1 tablet (180 mg total) by mouth at bedtime.  90 tablet  3   No current facility-administered medications on file prior to visit.    Review of Systems:  As per HPI, otherwise negative.    Physical Examination: Filed Vitals:   07/02/14 1258  BP: 157/99  Pulse: 76  Temp: 97.6 F (36.4 C)  Resp: 18   Filed Vitals:   07/02/14 1258  Height: 5\' 11"  (1.803 m)  Weight: 256 lb (116.121 kg)   Body mass index is 35.72 kg/(m^2). Ideal Body Weight: Weight in (lb) to have BMI = 25: 178.9  GEN: WDWN, NAD, Non-toxic, A & O x 3 HEENT: Atraumatic, Normocephalic. Neck supple. No masses, No LAD.  PRRERLA EOMI  CN 2-12 intact  Ears and Nose: No external deformity. CV: RRR, No M/G/R. No JVD. No thrill. No extra heart sounds. PULM: CTA B, no wheezes,  crackles, rhonchi. No retractions. No resp. distress. No accessory muscle use. ABD: S, NT, ND, +BS. No rebound. No HSM. EXTR: No c/c/e NEURO unsteady getting out of wheelchair.  Weakness in left upper grip, flexion, and extension.  Weak left lower abduction and adduction.  PSYCH: Normally interactive. Conversant. Not depressed or anxious appearing.  Calm demeanor.    Assessment and Plan: Ataxia, left sided weakness, and headache To ER via EMS   Signed,  Ellison Carwin, MD

## 2014-07-02 NOTE — Consult Note (Signed)
Reason for Consult: Brain mass   Referring Physician: Emergency department  Jake [name] is an 58 y.o. male.  HPI: Patient is a very pleasant 58 year old gentleman who has had about a 2 week history of headaches and dizziness that initially was treated as sinus trouble but persisted and progressed he developed some unsteadiness and balance difficulty in the emergency department today underwent evaluation was noted to be weak on the left side with a visual field cut and CT scan showed a right sided parietal occipital mass with extensive vasogenic edema. Patient denies any visual changes or blurriness he denies any difficulty reading and didn't notice the visual field cut. He did report that the weakness was not as apparent to him until he was examined by the physician earlier. He has had headache he denies any nausea vomiting. His past history is remarkable for hypertension and he smokes about a pack or 2 a day. He had no other significant past medical history that he related that except for hypothyroidism.  Past Medical History  Diagnosis Date  . Thyroid disease   . Hypertension   . Allergy   . Arthritis   . Sleep apnea   . Kidney stone     Past Surgical History  Procedure Laterality Date  . Knee arthroscopy w/ meniscal repair    . Treatment fistula anal      Family History  Problem Relation Age of Onset  . Hypertension Mother   . Diabetes Father   . Glaucoma Father   . Diabetes Brother   . Diabetes Brother   . Colon cancer Neg Hx     Social History:  reports that he has been smoking Cigarettes.  He has a 45 pack-year smoking history. He has never used smokeless tobacco. He reports that he drinks alcohol. He reports that he does not use illicit drugs.  Allergies: No Known Allergies  Medications: I have reviewed the patient's current medications.  Results for orders placed during the hospital encounter of 07/02/14 (from the past 48 hour(s))  I-STAT CHEM 8, ED     Status:  Abnormal   Collection Time    07/02/14  3:18 PM      Result Value Ref Range   Sodium 136 (*) 137 - 147 mEq/L   Potassium 4.0  3.7 - 5.3 mEq/L   Chloride 102  96 - 112 mEq/L   BUN 12  6 - 23 mg/dL   Creatinine, Ser 0.80  0.50 - 1.35 mg/dL   Glucose, Bld 102 (*) 70 - 99 mg/dL   Calcium, Ion 1.21  1.12 - 1.23 mmol/L   TCO2 23  0 - 100 mmol/L   Hemoglobin 16.7  13.0 - 17.0 g/dL   HCT 49.0  39.0 - 52.0 %  ETHANOL     Status: None   Collection Time    07/02/14  3:48 PM      Result Value Ref Range   Alcohol, Ethyl (B) <11  0 - 11 mg/dL   Comment:            LOWEST DETECTABLE LIMIT FOR     SERUM ALCOHOL IS 11 mg/dL     FOR MEDICAL PURPOSES ONLY  PROTIME-INR     Status: None   Collection Time    07/02/14  3:48 PM      Result Value Ref Range   Prothrombin Time 13.5  11.6 - 15.2 seconds   INR 1.02  0.00 - 1.49  APTT     Status:  None   Collection Time    07/02/14  3:48 PM      Result Value Ref Range   aPTT 34  24 - 37 seconds  CBC     Status: None   Collection Time    07/02/14  3:48 PM      Result Value Ref Range   WBC 6.4  4.0 - 10.5 K/uL   RBC 5.07  4.22 - 5.81 MIL/uL   Hemoglobin 15.2  13.0 - 17.0 g/dL   HCT 44.5  39.0 - 52.0 %   MCV 87.8  78.0 - 100.0 fL   MCH 30.0  26.0 - 34.0 pg   MCHC 34.2  30.0 - 36.0 g/dL   RDW 13.5  11.5 - 15.5 %   Platelets 198  150 - 400 K/uL  DIFFERENTIAL     Status: None   Collection Time    07/02/14  3:48 PM      Result Value Ref Range   Neutrophils Relative % 69  43 - 77 %   Neutro Abs 4.4  1.7 - 7.7 K/uL   Lymphocytes Relative 24  12 - 46 %   Lymphs Abs 1.5  0.7 - 4.0 K/uL   Monocytes Relative 7  3 - 12 %   Monocytes Absolute 0.5  0.1 - 1.0 K/uL   Eosinophils Relative 0  0 - 5 %   Eosinophils Absolute 0.0  0.0 - 0.7 K/uL   Basophils Relative 0  0 - 1 %   Basophils Absolute 0.0  0.0 - 0.1 K/uL  COMPREHENSIVE METABOLIC PANEL     Status: Abnormal   Collection Time    07/02/14  3:48 PM      Result Value Ref Range   Sodium 136 (*) 137  - 147 mEq/L   Potassium 4.1  3.7 - 5.3 mEq/L   Chloride 100  96 - 112 mEq/L   CO2 24  19 - 32 mEq/L   Glucose, Bld 96  70 - 99 mg/dL   BUN 12  6 - 23 mg/dL   Creatinine, Ser 0.85  0.50 - 1.35 mg/dL   Calcium 9.3  8.4 - 10.5 mg/dL   Total Protein 6.9  6.0 - 8.3 g/dL   Albumin 3.7  3.5 - 5.2 g/dL   AST 22  0 - 37 U/L   ALT 20  0 - 53 U/L   Alkaline Phosphatase 57  39 - 117 U/L   Total Bilirubin 0.5  0.3 - 1.2 mg/dL   GFR calc non Af Amer >90  >90 mL/min   GFR calc Af Amer >90  >90 mL/min   Comment: (NOTE)     The eGFR has been calculated using the CKD EPI equation.     This calculation has not been validated in all clinical situations.     eGFR's persistently <90 mL/min signify possible Chronic Kidney     Disease.   Anion gap 12  5 - 15  I-STAT TROPOININ, ED     Status: None   Collection Time    07/02/14  4:09 PM      Result Value Ref Range   Troponin i, poc 0.00  0.00 - 0.08 ng/mL   Comment 3            Comment: Due to the release kinetics of cTnI,     a negative result within the first hours     of the onset of symptoms does not rule out  myocardial infarction with certainty.     If myocardial infarction is still suspected,     repeat the test at appropriate intervals.  URINE RAPID DRUG SCREEN (HOSP PERFORMED)     Status: None   Collection Time    07/02/14  4:36 PM      Result Value Ref Range   Opiates NONE DETECTED  NONE DETECTED   Cocaine NONE DETECTED  NONE DETECTED   Benzodiazepines NONE DETECTED  NONE DETECTED   Amphetamines NONE DETECTED  NONE DETECTED   Tetrahydrocannabinol NONE DETECTED  NONE DETECTED   Barbiturates NONE DETECTED  NONE DETECTED   Comment:            DRUG SCREEN FOR MEDICAL PURPOSES     ONLY.  IF CONFIRMATION IS NEEDED     FOR ANY PURPOSE, NOTIFY LAB     WITHIN 5 DAYS.                LOWEST DETECTABLE LIMITS     FOR URINE DRUG SCREEN     Drug Class       Cutoff (ng/mL)     Amphetamine      1000     Barbiturate      200      Benzodiazepine   098     Tricyclics       119     Opiates          300     Cocaine          300     THC              50  URINALYSIS, ROUTINE W REFLEX MICROSCOPIC     Status: Abnormal   Collection Time    07/02/14  4:36 PM      Result Value Ref Range   Color, Urine YELLOW  YELLOW   APPearance CLEAR  CLEAR   Specific Gravity, Urine 1.011  1.005 - 1.030   pH 6.0  5.0 - 8.0   Glucose, UA NEGATIVE  NEGATIVE mg/dL   Hgb urine dipstick TRACE (*) NEGATIVE   Bilirubin Urine NEGATIVE  NEGATIVE   Ketones, ur NEGATIVE  NEGATIVE mg/dL   Protein, ur NEGATIVE  NEGATIVE mg/dL   Urobilinogen, UA 0.2  0.0 - 1.0 mg/dL   Nitrite NEGATIVE  NEGATIVE   Leukocytes, UA NEGATIVE  NEGATIVE  URINE MICROSCOPIC-ADD ON     Status: None   Collection Time    07/02/14  4:36 PM      Result Value Ref Range   Squamous Epithelial / LPF RARE  RARE   RBC / HPF 0-2  <3 RBC/hpf    Ct Head Wo Contrast  07/02/2014   CLINICAL DATA:  Headaches for 1 week, with cord may shin issues on the left side  EXAM: CT HEAD WITHOUT CONTRAST  TECHNIQUE: Contiguous axial images were obtained from the base of the skull through the vertex without intravenous contrast.  COMPARISON:  None.  FINDINGS: The bony calvarium is intact. Paranasal sinuses show some mild mucosal thickening within the ethmoid air cells.  There is a somewhat ovoid area decreased attenuation identified in the right posterior parietal region near the vertex which measures at least 4.1 x 3.8 cm in dimension. It is surrounded by significant white matter edema and consistent with an underlying neoplasm. There is significant distortion of the right lateral ventricle particularly the occipital horn and there is evidence of midline shift of approximately 9-10 mm. No findings to  suggest acute hemorrhage are seen. No other focal lesions are noted.  IMPRESSION: Changes consistent with a focal neoplasm within the right posterior parietal region with significant surrounding white matter  edema and midline shift. MRI with contrast material is recommended for further evaluation.  Critical Value/emergent results were called by telephone at the time of interpretation on 07/02/2014 at 4:39 pm to Dr. Carlisle Cater , who verbally acknowledged these results.   Electronically Signed   By: Inez Catalina M.D.   On: 07/02/2014 16:42    Review of Systems  Constitutional: Negative.   Skin: Negative.   Neurological: Positive for dizziness, sensory change, focal weakness and headaches.  Psychiatric/Behavioral: Negative.    Blood pressure 124/83, pulse 78, temperature 98.3 F (36.8 C), temperature source Oral, resp. rate 17, SpO2 98.00%. Physical Exam  Constitutional: He is oriented to person, place, and time. He appears well-developed and well-nourished.  HENT:  Head: Normocephalic and atraumatic.  Eyes: Pupils are equal, round, and reactive to light.  Neck: Normal range of motion.  Cardiovascular: Normal rate.   Respiratory: Effort normal.  GI: Soft. Bowel sounds are normal.  Musculoskeletal: Normal range of motion.  Neurological: He is alert and oriented to person, place, and time. GCS eye subscore is 4. GCS verbal subscore is 5. GCS motor subscore is 6.  Reflex Scores:      Tricep reflexes are 2+ on the right side and 3+ on the left side.      Bicep reflexes are 2+ on the right side and 3+ on the left side.      Brachioradialis reflexes are 2+ on the right side and 3+ on the left side.      Patellar reflexes are 2+ on the right side and 3+ on the left side.      Achilles reflexes are 2+ on the right side and 3+ on the left side. Patient is awake alert oriented pupils are equal extraocular movements are intact visual fields by confrontation show a left homonymous hemianopsia strength is 5-5 and his right upper and lower extremity however 4+ out of 5 with a pronator drift in the left upper and lower extremity. He is hyperreflexic on the left 2+ on the right.     Assessment/Plan: 58 year old gentleman with a right parietal occipital mass lesion with extensive vasogenic edema. With the extent of edema on concerned this represents metastatic disease certainly could be a primary glioma. I recommend MRI scan with and without contrast brain lab protocol as well as medicine admission with full metastatic workup. Extensively gone over the diagnosis with the patient and his family explained all the possibilities included the fact this might be a malignant cancer that could've spread from somewhere else. We will continue the metastatic workup throughout the weekend should this come down to a single solitary lesion or primary brain tumor we will plan surgical excision with steroid ptotic navigation more most likely on Wednesday.  Auria Mckinlay P 07/02/2014, 6:26 PM

## 2014-07-02 NOTE — ED Notes (Addendum)
Pt CT and then to MRI once CT is complete.

## 2014-07-02 NOTE — ED Provider Notes (Signed)
CSN: [id number]     Arrival date & time 07/02/14  1421 History   First MD Initiated Contact with Patient 07/02/14 1506     Chief Complaint  Patient presents with  . Weakness     (Consider location/radiation/quality/duration/timing/severity/associated sxs/prior Treatment) HPI Comments: Patient with history of hypertension presents from [facility] urgent care with complaint of headache and weakness. History taken from patient and his wife. Patient has noted gradual worsening of his depth perception and left sided coordination over the past 3 weeks. At first patient thought that the problems with his glasses and he had these adjusted with some improvement. However symptoms continued. Patient has not fallen however he has been walking into objects and has had trouble reaching out to grasp objects. He has had intermittent headaches and dizziness. She was seen by his primary care physician 4 days ago and diagnosed with a sinusitis/ear infection. He was started on Augmentin. Patient received no relief from this and today was seen again. He was found to have left arm and leg weakness on exam and was transferred to the emergency department for further evaluation. Family history of heart disease and blood pressure but no history of stroke. Patient has a 40-pack-year smoking history. Wife notes slightly slurred speech but no facial droop. No sensation problems in extremities.  Patient otherwise denies fever, neck pain, chest pain, shortness of breath, hemoptysis, cough, abdominal pain, diarrhea, urinary symptoms including weak stream. No skin rash.  The history is provided by the patient.    Past Medical History  Diagnosis Date  . Thyroid disease   . Hypertension   . Allergy   . Arthritis   . Sleep apnea   . Kidney stone    Past Surgical History  Procedure Laterality Date  . Knee arthroscopy w/ meniscal repair    . Treatment fistula anal     Family History  Problem Relation Age of Onset  .  Hypertension Mother   . Diabetes Father   . Glaucoma Father   . Diabetes Brother   . Diabetes Brother   . Colon cancer Neg Hx    History  Substance Use Topics  . Smoking status: Current Every Day Smoker -- 1.00 packs/day for 45 years    Types: Cigarettes  . Smokeless tobacco: Never Used  . Alcohol Use: Yes     Comment: 2-3 beers per/day     Review of Systems  Constitutional: Negative for fever.  HENT: Negative for congestion, dental problem, rhinorrhea and sinus pressure.   Eyes: Positive for visual disturbance (intermittent blurry vision). Negative for photophobia, discharge and redness.  Respiratory: Negative for shortness of breath.   Cardiovascular: Negative for chest pain.  Gastrointestinal: Negative for nausea and vomiting.  Musculoskeletal: Positive for gait problem. Negative for neck pain and neck stiffness.  Skin: Negative for rash.  Neurological: Positive for dizziness (dysmetria), speech difficulty (slurred speech), weakness and headaches. Negative for syncope, facial asymmetry, light-headedness and numbness.  Psychiatric/Behavioral: Negative for confusion.    Allergies  Review of patient's allergies indicates no known allergies.  Home Medications   Prior to Admission medications   Medication Sig Start Date End Date Taking? Authorizing Provider  amoxicillin-clavulanate (AUGMENTIN) 875-125 MG per tablet Take 1 tablet by mouth 2 (two) times daily. 06/28/14   Shawnee Knapp, MD  aspirin 81 MG chewable tablet Chew 81 mg by mouth daily.    Historical Provider, MD  fluticasone (FLONASE) 50 MCG/ACT nasal spray Place 2 sprays into both nostrils at bedtime. 06/28/14  Shawnee Knapp, MD  Guaifenesin [facility] MAXIMUM STRENGTH) 1200 MG TB12 Take 1 tablet (1,200 mg total) by mouth every 12 (twelve) hours as needed. 06/28/14   Shawnee Knapp, MD  levothyroxine (SYNTHROID, LEVOTHROID) 50 MCG tablet TAKE ONE TABLET BY MOUTH EVERY DAY 01/25/14   Elby Beck, FNP  lisinopril  (PRINIVIL,ZESTRIL) 40 MG tablet TAKE ONE TABLET BY MOUTH EVERY DAY 01/25/14   Elby Beck, FNP  meclizine (ANTIVERT) 25 MG tablet Take 1 tablet (25 mg total) by mouth 3 (three) times daily as needed for dizziness. 06/28/14   Shawnee Knapp, MD  sildenafil (VIAGRA) 100 MG tablet Take 1/2 to 1 tablet as needed. No more than 1 tablet per day. 01/28/14   Elby Beck, FNP  verapamil (COVERA HS) 180 MG (CO) 24 hr tablet Take 1 tablet (180 mg total) by mouth at bedtime. 01/25/14   Elby Beck, FNP   BP 144/77  Pulse 84  Temp(Src) 98.3 F (36.8 C) (Oral)  Resp 17  SpO2 99%  Physical Exam  Nursing note and vitals reviewed. Constitutional: He is oriented to person, place, and time. He appears well-developed and well-nourished.  HENT:  Head: Normocephalic and atraumatic.  Right Ear: Tympanic membrane, external ear and ear canal normal.  Left Ear: Tympanic membrane, external ear and ear canal normal.  Nose: Nose normal.  Mouth/Throat: Uvula is midline, oropharynx is clear and moist and mucous membranes are normal.  Eyes: Conjunctivae, EOM and lids are normal. Pupils are equal, round, and reactive to light.  Neck: Normal range of motion. Neck supple.  No carotid bruit.   Cardiovascular: Normal rate and regular rhythm.   Pulmonary/Chest: Effort normal and breath sounds normal.  Abdominal: Soft. There is no tenderness.  Musculoskeletal: Normal range of motion. He exhibits no edema and no tenderness.       Cervical back: He exhibits normal range of motion, no tenderness and no bony tenderness.  Neurological: He is alert and oriented to person, place, and time. He has normal strength and normal reflexes. A cranial nerve deficit (as noted) is present. No sensory deficit. He exhibits normal muscle tone. Coordination abnormal. GCS eye subscore is 4. GCS verbal subscore is 5. GCS motor subscore is 6.  Patient has some hesitancy tracking finger and eye movements are not smooth, but full EOMI. I  suspect L homonomous hemianopsia given visual field testing. + dysmetria with L upper extremity. Patient has 5/5 strength with encouragement but fatigues quickly and has + drift L arm and leg. Proprioception intact R hand, not intact L hand. Cranial nerves otherwise intact.   Skin: Skin is warm and dry.  Psychiatric: He has a normal mood and affect.    ED Course  Procedures (including critical care time) Labs Review Labs Reviewed  COMPREHENSIVE METABOLIC PANEL - Abnormal; Notable for the following:    Sodium 136 (*)    All other components within normal limits  URINALYSIS, ROUTINE W REFLEX MICROSCOPIC - Abnormal; Notable for the following:    Hgb urine dipstick TRACE (*)    All other components within normal limits  I-STAT CHEM 8, ED - Abnormal; Notable for the following:    Sodium 136 (*)    Glucose, Bld 102 (*)    All other components within normal limits  ETHANOL  PROTIME-INR  APTT  CBC  DIFFERENTIAL  URINE MICROSCOPIC-ADD ON  URINE RAPID DRUG SCREEN (HOSP PERFORMED)  CBG MONITORING, ED  Randolm Idol, ED    Imaging Review  Ct Head Wo Contrast  07/02/2014   CLINICAL DATA:  Headaches for 1 week, with cord may shin issues on the left side  EXAM: CT HEAD WITHOUT CONTRAST  TECHNIQUE: Contiguous axial images were obtained from the base of the skull through the vertex without intravenous contrast.  COMPARISON:  None.  FINDINGS: The bony calvarium is intact. Paranasal sinuses show some mild mucosal thickening within the ethmoid air cells.  There is a somewhat ovoid area decreased attenuation identified in the right posterior parietal region near the vertex which measures at least 4.1 x 3.8 cm in dimension. It is surrounded by significant white matter edema and consistent with an underlying neoplasm. There is significant distortion of the right lateral ventricle particularly the occipital horn and there is evidence of midline shift of approximately 9-10 mm. No findings to suggest acute  hemorrhage are seen. No other focal lesions are noted.  IMPRESSION: Changes consistent with a focal neoplasm within the right posterior parietal region with significant surrounding white matter edema and midline shift. MRI with contrast material is recommended for further evaluation.  Critical Value/emergent results were called by telephone at the time of interpretation on 07/02/2014 at 4:39 pm to Dr. Carlisle Cater , who verbally acknowledged these results.   Electronically Signed   By: Inez Catalina M.D.   On: 07/02/2014 16:42     EKG Interpretation None      3:39 PM Patient seen and examined. Work-up initiated. Discussed with Dr. Canary Brim.   Vital signs reviewed and are as follows: BP 144/77  Pulse 84  Temp(Src) 98.3 F (36.8 C) (Oral)  Resp 17  SpO2 99%  4:59 PM Patient informed of CT results. Family at bedside for support.   Will call neurosurgery for treatment reccs and consult. MRI ordered. Pt will need admitted for further diagnostics, IV steroids.   5:11 PM Spoke with Dr. Saintclair Halsted who will see. IV decadron ordered. CT chest/abd/pelvis ordered. Will ask for medical admit.   5:26 PM [facility] (Beatrice patient) to see and admit. Dr. Nettey/Dr. Lindell Noe.   MDM   Final diagnoses:  Brain tumor   Admit for above.     Carlisle Cater, PA-C 07/02/14 [phone]

## 2014-07-02 NOTE — ED Notes (Signed)
Initial neuro check at 14:48 performed by Angela Nevin, RN.

## 2014-07-02 NOTE — H&P (Signed)
[facility] Admission History and Physical Service Pager: [phone]  Patient name: Jake [name] Medical record number: [id number] Date of birth: February 01, 1956 Age: 58 y.o. Gender: male  Primary Care Provider: [name], MD Consultants: Neurosurgery Code Status: Full code  Chief Complaint: Left sided hemiparesis  Assessment and Plan: Jake [name] is a 58 y.o. 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 PMH is significant for hypertension  #Right posterior parietal brain mass: appears to be a primary brain lesion. No signs of increased intracranial pressure. Neurosurgery has seen and made recommendations for probable surgery on Wednesday (11/4). Patient still with left sided deficits with associated hyperreflexia. Negative for Cushing's Triad. S/p dexamethasone x1 in the ED. Patient is a smoker with concern for possible metasatic lesion. CT chest, abdomen, pelvis significant for 33mm lung nodule, but otherwise negative for neoplasm. - admit to inpatient, stepdown unit, attending Dr. Lindell Noe - follow-up neurosurgery recommendations - frequent neuro checks - continue dexamethasone 10mg  q6hrs - telemetry - eventually will need PT/OT eval  # Hypertension: on lisinopril and verapamil at home. Currently normotensive - continue lisinopril - continue verapamil as immediate release q8hrs while inpateint  # Hypothyroidism: currently on synthroid 61mcg. Last TSH wnl at 3.263 on 12/2013. - continue synthroid 7mcg daily  FEN/GI: Heart healthy, KVO Prophylaxis: heparin subq  Disposition: admit to stepdown unit, attending Dr. Lindell Noe  History of Present Illness: Jake [name] is a 58 y.o. male presenting with left sided hemiparesis. Was seen at [facility] for left sided weakness earlier today. Symptoms started with abnormal coordination beginning a few weeks ago. He thought his symptoms were related to his  glasses. He developed issues reaching and grabbing items with associated trouble with balance/depth perception. A few days ago, he developed a headache and went to urgent care for evaluation. He was thought to have a sinus infection and sent home with antibiotics and nasal spray. His symptoms did not improve and returned to urgent care for reevaluation. He was found to have significant left sided weakness and was sent to ED for further evaluation. Patient's mom was speaking to him on the phone last night and noticed slurred speech. Patient's wife has not noticed speech slurring.   In the ED, the patient had a CT that showed a right posterior parietal lesion with left mass mass effect. Patient given 10mg  dexamethasone.  Review Of Systems: Per HPI with the following additions: None Otherwise 12 point review of systems was performed and was unremarkable.  Patient Active Problem List   Diagnosis Date Noted  . Brain tumor 07/02/2014  . Tobacco abuse 01/31/2013  . Unspecified hypothyroidism 01/31/2013  . Hypertension 01/31/2013  . Encounter for long-term (current) use of other medications 01/31/2013   Past Medical History: Past Medical History  Diagnosis Date  . Thyroid disease   . Hypertension   . Allergy   . Arthritis   . Sleep apnea   . Kidney stone    Past Surgical History: Past Surgical History  Procedure Laterality Date  . Knee arthroscopy w/ meniscal repair    . Treatment fistula anal     Social History: History  Substance Use Topics  . Smoking status: Current Every Day Smoker -- 1.00 packs/day for 45 years    Types: Cigarettes  . Smokeless tobacco: Never Used  . Alcohol Use: Yes     Comment: 2-3 beers per/day    Additional social history: None  Please also refer to relevant sections of EMR.  Family History: Family History  Problem Relation Age of Onset  . Hypertension Mother   . Diabetes Father   . Glaucoma Father   . Diabetes Brother   . Diabetes Brother   .  Colon cancer Neg Hx    Allergies and Medications: No Known Allergies No current facility-administered medications on file prior to encounter.   Current Outpatient Prescriptions on File Prior to Encounter  Medication Sig Dispense Refill  . amoxicillin-clavulanate (AUGMENTIN) 875-125 MG per tablet Take 1 tablet by mouth 2 (two) times daily.  20 tablet  0  . aspirin 81 MG chewable tablet Chew 81 mg by mouth daily.      . fluticasone (FLONASE) 50 MCG/ACT nasal spray Place 2 sprays into both nostrils at bedtime.  16 g  2  . Guaifenesin (MUCINEX MAXIMUM STRENGTH) 1200 MG TB12 Take 1 tablet (1,200 mg total) by mouth every 12 (twelve) hours as needed.  14 tablet  1  . meclizine (ANTIVERT) 25 MG tablet Take 1 tablet (25 mg total) by mouth 3 (three) times daily as needed for dizziness.  30 tablet  0  . sildenafil (VIAGRA) 100 MG tablet Take 1/2 to 1 tablet as needed. No more than 1 tablet per day.  5 tablet  11    Objective: BP 142/79  Pulse 84  Temp(Src) 98.3 F (36.8 C) (Oral)  Resp 16  SpO2 97% Exam: General: Well appearing, in no acute distress HEENT: PERRLA, could not appreciate papilledema. Moist mucous membranes Cardiovascular: Regular rate and rhythm, no murmur Respiratory: Clear to auscultation bilaterally without wheezing Abdomen: Soft, non-tender, non-distended Extremities: No edema or tenderness Skin: No cyanosis. Warm, dry Neuro: Alert, oriented x3, CN3-12 intact. 3+ patellar reflex compared to 2+ on right. 4/5 LUE strength and 3/5 LLE strength compared to 5/5 RUE and RLE strength.  Labs and Imaging: CBC BMET   Recent Labs Lab 07/02/14 1548  WBC 6.4  HGB 15.2  HCT 44.5  PLT 198    Recent Labs Lab 07/02/14 1548  NA 136*  K 4.1  CL 100  CO2 24  BUN 12  CREATININE 0.85  GLUCOSE 96  CALCIUM 9.3     Ct Head Wo Contrast  07/02/2014   CLINICAL DATA:  Headaches for 1 week, with cord may shin issues on the left side  EXAM: CT HEAD WITHOUT CONTRAST  TECHNIQUE:  Contiguous axial images were obtained from the base of the skull through the vertex without intravenous contrast.  COMPARISON:  None.  FINDINGS: The bony calvarium is intact. Paranasal sinuses show some mild mucosal thickening within the ethmoid air cells.  There is a somewhat ovoid area decreased attenuation identified in the right posterior parietal region near the vertex which measures at least 4.1 x 3.8 cm in dimension. It is surrounded by significant white matter edema and consistent with an underlying neoplasm. There is significant distortion of the right lateral ventricle particularly the occipital horn and there is evidence of midline shift of approximately 9-10 mm. No findings to suggest acute hemorrhage are seen. No other focal lesions are noted.  IMPRESSION: Changes consistent with a focal neoplasm within the right posterior parietal region with significant surrounding white matter edema and midline shift. MRI with contrast material is recommended for further evaluation.  Critical Value/emergent results were called by telephone at the time of interpretation on 07/02/2014 at 4:39 pm to Dr. Carlisle Cater , who verbally acknowledged these results.   Electronically Signed   By: Linus Mako.D.  On: 07/02/2014 16:42   Ct Chest W Contrast  07/02/2014   CLINICAL DATA:  Patient will brain tumor and left-sided weakness  EXAM: CT CHEST, ABDOMEN, AND PELVIS WITH CONTRAST  TECHNIQUE: Multidetector CT imaging of the chest, abdomen and pelvis was performed following the standard protocol during bolus administration of intravenous contrast. Oral contrast was administered for this CT abdomen and pelvis studies.  CONTRAST:  145mL OMNIPAQUE IOHEXOL 300 MG/ML  SOLN  COMPARISON:  None.  FINDINGS: CT CHEST FINDINGS  There is minimal scarring in the inferior lingula. There is no lung edema or consolidation. On axial slice 18 series 4, there is a 2 mm nodular opacity in the posterior segment of the right upper lobe. The  lungs are otherwise clear. There is no appreciable thoracic adenopathy. There are a few small mediastinal lymph nodes which do not meet size criteria for pathologic significance. There are small calcified sub- carinal and left hilar lymph nodes consistent with prior granulomatous disease.  There are multiple foci of coronary artery calcification. Pericardium is not thickened.  There are no blastic or lytic bone lesions in the thoracic region. Visualized thyroid appears normal.  CT ABDOMEN AND PELVIS FINDINGS  There are occasional small calcified spur hepatic granulomas. Liver otherwise appears unremarkable. No liver masses are identified. Gallbladder wall is not appreciably thickened. There is no biliary duct dilatation.  There are calcified splenic granulomas. Spleen otherwise appears normal.  Pancreas and adrenals appear normal. Kidneys bilaterally show no mass or hydronephrosis on either side. There is no renal or ureteral calculus on either side.  In the pelvis, the urinary bladder is midline with normal wall thickness. There is no new pelvic mass or fluid collection. There is fat in each inguinal ring. There are sigmoid diverticula without diverticulitis. There are prostatic calculi present.  Appendix appears normal. Terminal ileum appears normal. There is lipomatous infiltration of the ileocecal valve.  There is no bowel obstruction. No free air or portal venous air. There is no appreciable ascites, adenopathy, or abscess in the abdomen or pelvis. There is atherosclerotic change in the aorta but no aneurysm. There are no blastic or lytic bone lesions.  IMPRESSION: CT chest: No edema or consolidation. No adenopathy. There is a 2 mm nodular opacity in the posterior segment right upper lobe. Followup of this nodular opacity should be based on Fleischner Society guidelines. If the patient is at high risk for bronchogenic carcinoma, follow-up chest CT at 1 year is recommended. If the patient is at low risk, no  follow-up is needed. This recommendation follows the consensus statement: Guidelines for Management of Small Pulmonary Nodules Detected on CT Scans: A Statement from the [place] as published in Radiology 2005; 237:395-400. No other parenchymal lung nodular lesions seen. There are multiple foci of coronary artery calcification.  CT abdomen and pelvis: There is no neoplastic focus appreciable. There is no adenopathy. There are granulomas in the liver and spleen. There is no bowel obstruction. No abscess. There is sigmoid diverticulosis without diverticulitis. There are prostatic calculi present. There is lipomatous infiltration of the ileocecal valve.   Electronically Signed   By: Lowella Grip M.D.   On: 07/02/2014 19:17   Ct Abdomen Pelvis W Contrast  07/02/2014   CLINICAL DATA:  Patient will brain tumor and left-sided weakness  EXAM: CT CHEST, ABDOMEN, AND PELVIS WITH CONTRAST  TECHNIQUE: Multidetector CT imaging of the chest, abdomen and pelvis was performed following the standard protocol during bolus administration of intravenous contrast. Oral contrast  was administered for this CT abdomen and pelvis studies.  CONTRAST:  111mL OMNIPAQUE IOHEXOL 300 MG/ML  SOLN  COMPARISON:  None.  FINDINGS: CT CHEST FINDINGS  There is minimal scarring in the inferior lingula. There is no lung edema or consolidation. On axial slice 18 series 4, there is a 2 mm nodular opacity in the posterior segment of the right upper lobe. The lungs are otherwise clear. There is no appreciable thoracic adenopathy. There are a few small mediastinal lymph nodes which do not meet size criteria for pathologic significance. There are small calcified sub- carinal and left hilar lymph nodes consistent with prior granulomatous disease.  There are multiple foci of coronary artery calcification. Pericardium is not thickened.  There are no blastic or lytic bone lesions in the thoracic region. Visualized thyroid appears normal.  CT  ABDOMEN AND PELVIS FINDINGS  There are occasional small calcified spur hepatic granulomas. Liver otherwise appears unremarkable. No liver masses are identified. Gallbladder wall is not appreciably thickened. There is no biliary duct dilatation.  There are calcified splenic granulomas. Spleen otherwise appears normal.  Pancreas and adrenals appear normal. Kidneys bilaterally show no mass or hydronephrosis on either side. There is no renal or ureteral calculus on either side.  In the pelvis, the urinary bladder is midline with normal wall thickness. There is no new pelvic mass or fluid collection. There is fat in each inguinal ring. There are sigmoid diverticula without diverticulitis. There are prostatic calculi present.  Appendix appears normal. Terminal ileum appears normal. There is lipomatous infiltration of the ileocecal valve.  There is no bowel obstruction. No free air or portal venous air. There is no appreciable ascites, adenopathy, or abscess in the abdomen or pelvis. There is atherosclerotic change in the aorta but no aneurysm. There are no blastic or lytic bone lesions.  IMPRESSION: CT chest: No edema or consolidation. No adenopathy. There is a 2 mm nodular opacity in the posterior segment right upper lobe. Followup of this nodular opacity should be based on Fleischner Society guidelines. If the patient is at high risk for bronchogenic carcinoma, follow-up chest CT at 1 year is recommended. If the patient is at low risk, no follow-up is needed. This recommendation follows the consensus statement: Guidelines for Management of Small Pulmonary Nodules Detected on CT Scans: A Statement from the [place] as published in Radiology 2005; 237:395-400. No other parenchymal lung nodular lesions seen. There are multiple foci of coronary artery calcification.  CT abdomen and pelvis: There is no neoplastic focus appreciable. There is no adenopathy. There are granulomas in the liver and spleen. There is no  bowel obstruction. No abscess. There is sigmoid diverticulosis without diverticulitis. There are prostatic calculi present. There is lipomatous infiltration of the ileocecal valve.   Electronically Signed   By: Lowella Grip M.D.   On: 07/02/2014 19:17    Cordelia Poche, MD 07/02/2014, 6:08 PM PGY-2, [place] Intern pager: [phone], text pages welcome

## 2014-07-02 NOTE — ED Notes (Signed)
Pt back from CT

## 2014-07-02 NOTE — ED Notes (Signed)
PA at bedside discussing CT results with pt and family.

## 2014-07-02 NOTE — ED Notes (Signed)
This RN picking up pt at MRI and transporting to floor.

## 2014-07-02 NOTE — ED Notes (Signed)
To room via EMS.  Pt transported from [place] [facility].   Pt seen last Tuesday for mild right pain, headache, decreased coordination and dizziness.  Pt was started on Augmentin. Pt seen in urgent care today for continued headache.  MD noticed left sided arm and leg weakness and pt was sent to ED.  Pt has had diarrhea and nausea at mealtimes and after mealtimes since starting Augmentin.

## 2014-07-03 ENCOUNTER — Encounter (HOSPITAL_COMMUNITY): Payer: Self-pay | Admitting: *Deleted

## 2014-07-03 DIAGNOSIS — M6281 Muscle weakness (generalized): Secondary | ICD-10-CM | POA: Insufficient documentation

## 2014-07-03 LAB — GLUCOSE, CAPILLARY
GLUCOSE-CAPILLARY: 154 mg/dL — AB (ref 70–99)
Glucose-Capillary: 134 mg/dL — ABNORMAL HIGH (ref 70–99)

## 2014-07-03 LAB — MRSA PCR SCREENING: MRSA by PCR: NEGATIVE

## 2014-07-03 MED ORDER — INFLUENZA VAC SPLIT QUAD 0.5 ML IM SUSY
0.5000 mL | PREFILLED_SYRINGE | INTRAMUSCULAR | Status: DC
  Filled 2014-07-03: qty 0.5

## 2014-07-03 NOTE — Progress Notes (Signed)
Family Medicine Teaching Service Daily Progress Note Intern Pager: [phone]  Patient name: Jake [name] Medical record number: [id number] Date of birth: May 15, 1956 Age: 58 y.o. 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 PMH is significant for hypertension  #Right posterior parietal brain mass: appears to be a primary brain lesion. No signs of increased intracranial pressure. Neurosurgery has seen and made recommendations for probable surgery on Wednesday (11/4). Patient still with left sided deficits with associated hyperreflexia. No signs of ICP. Patient is a smoker with concern for possible metasatic lesion. CT chest, abdomen, pelvis significant for 77mm lung nodule, but otherwise negative for neoplasm. - neurosurgery following - frequent neuro checks - continue dexamethasone 10mg  q6hrs - telemetry - PT/OT evals ordered - up with assistance  # Hypertension: on lisinopril and verapamil at home. Currently normotensive - continue lisinopril - continue verapamil as immediate release q8hrs while inpateint  # Hypothyroidism: currently on synthroid 10mcg. Last TSH wnl at 3.263 on 12/2013. - continue synthroid 79mcg daily  FEN/GI: Heart healthy, KVO Prophylaxis: heparin subq  Disposition: Pending further work-up  Subjective:  Reporting improvement in L hemiparesis this am  Objective: Temp:  [97.2 F (36.2 C)-98.4 F (36.9 C)] 97.2 F (36.2 C) (11/01 0806) Pulse Rate:  [76-84] 79 (11/01 0526) Resp:  [15-23] 15 (11/01 0526) BP: (124-157)/(77-99) 140/83 mmHg (11/01 0526) SpO2:  [92 %-99 %] 96 % (11/01 0526) Weight:  [245 lb 6 oz (111.3 kg)-256 lb (116.121 kg)]  245 lb 6 oz (111.3 kg) (10/31 2100) Physical Exam: General: Well appearing, in no acute distress HEENT: NCAT, Moist mucous membranes Cardiovascular: Regular rate and rhythm, no murmur Respiratory: Clear to auscultation bilaterally without wheezing Abdomen: Soft, non-tender, non-distended Extremities: No edema or tenderness Skin: No cyanosis. Warm, dry Neuro: Alert, oriented x3, CN3-12 intact. 4/5 LUE strength and 4/5 LLE strength compared to 5/5 RUE and RLE strength.  Laboratory:  Recent Labs Lab 07/02/14 1518 07/02/14 1548  WBC  --  6.4  HGB 16.7 15.2  HCT 49.0 44.5  PLT  --  198    Recent Labs Lab 07/02/14 1518 07/02/14 1548  NA 136* 136*  K 4.0 4.1  CL 102 100  CO2  --  24  BUN 12 12  CREATININE 0.80 0.85  CALCIUM  --  9.3  PROT  --  6.9  BILITOT  --  0.5  ALKPHOS  --  57  ALT  --  20  AST  --  22  GLUCOSE 102* 96    Imaging/Diagnostic Tests: CT chest: No edema or consolidation. No adenopathy. There is a 2 mm nodular opacity in the posterior segment right upper lobe.   CT abdomen and pelvis: There is no neoplastic focus appreciable. There is no adenopathy. There are granulomas in the liver and spleen. There is no bowel obstruction. No abscess. There is sigmoid diverticulosis without diverticulitis. There are prostatic calculi present. There is lipomatous infiltration of the ileocecal valve.  Frazier Richards, MD 07/03/2014, 8:49 AM PGY-2, [place] Intern pager: [phone], text pages welcome

## 2014-07-03 NOTE — Progress Notes (Signed)
Subjective: Patient reports feeling much better on the steroids increased movement increase function left side  Objective: Vital signs in last 24 hours: Temp:  [97.2 F (36.2 C)-98.4 F (36.9 C)] 97.2 F (36.2 C) (11/01 0806) Pulse Rate:  [76-99] 99 (11/01 0723) Resp:  [15-23] 15 (11/01 0723) BP: (124-157)/(77-99) 132/77 mmHg (11/01 0723) SpO2:  [92 %-99 %] 98 % (11/01 0723) Weight:  [111.3 kg (245 lb 6 oz)-116.121 kg (256 lb)] 111.3 kg (245 lb 6 oz) (10/31 2100)  Intake/Output from previous day: 10/31 0701 - 11/01 0700 In: 120 [P.O.:120] Out: -  Intake/Output this shift:    Awake alert oriented still with left pronator drift hyperreflexic on the left  Lab Results:  Recent Labs  07/02/14 1518 07/02/14 1548  WBC  --  6.4  HGB 16.7 15.2  HCT 49.0 44.5  PLT  --  198   BMET  Recent Labs  07/02/14 1518 07/02/14 1548  NA 136* 136*  K 4.0 4.1  CL 102 100  CO2  --  24  GLUCOSE 102* 96  BUN 12 12  CREATININE 0.80 0.85  CALCIUM  --  9.3    Studies/Results: Ct Head Wo Contrast  07/02/2014   CLINICAL DATA:  Headaches for 1 week, with cord may shin issues on the left side  EXAM: CT HEAD WITHOUT CONTRAST  TECHNIQUE: Contiguous axial images were obtained from the base of the skull through the vertex without intravenous contrast.  COMPARISON:  None.  FINDINGS: The bony calvarium is intact. Paranasal sinuses show some mild mucosal thickening within the ethmoid air cells.  There is a somewhat ovoid area decreased attenuation identified in the right posterior parietal region near the vertex which measures at least 4.1 x 3.8 cm in dimension. It is surrounded by significant white matter edema and consistent with an underlying neoplasm. There is significant distortion of the right lateral ventricle particularly the occipital horn and there is evidence of midline shift of approximately 9-10 mm. No findings to suggest acute hemorrhage are seen. No other focal lesions are noted.   IMPRESSION: Changes consistent with a focal neoplasm within the right posterior parietal region with significant surrounding white matter edema and midline shift. MRI with contrast material is recommended for further evaluation.  Critical Value/emergent results were called by telephone at the time of interpretation on 07/02/2014 at 4:39 pm to Dr. Carlisle Cater , who verbally acknowledged these results.   Electronically Signed   By: Inez Catalina M.D.   On: 07/02/2014 16:42   Ct Chest W Contrast  07/02/2014   CLINICAL DATA:  Patient will brain tumor and left-sided weakness  EXAM: CT CHEST, ABDOMEN, AND PELVIS WITH CONTRAST  TECHNIQUE: Multidetector CT imaging of the chest, abdomen and pelvis was performed following the standard protocol during bolus administration of intravenous contrast. Oral contrast was administered for this CT abdomen and pelvis studies.  CONTRAST:  13mL OMNIPAQUE IOHEXOL 300 MG/ML  SOLN  COMPARISON:  None.  FINDINGS: CT CHEST FINDINGS  There is minimal scarring in the inferior lingula. There is no lung edema or consolidation. On axial slice 18 series 4, there is a 2 mm nodular opacity in the posterior segment of the right upper lobe. The lungs are otherwise clear. There is no appreciable thoracic adenopathy. There are a few small mediastinal lymph nodes which do not meet size criteria for pathologic significance. There are small calcified sub- carinal and left hilar lymph nodes consistent with prior granulomatous disease.  There are multiple foci  of coronary artery calcification. Pericardium is not thickened.  There are no blastic or lytic bone lesions in the thoracic region. Visualized thyroid appears normal.  CT ABDOMEN AND PELVIS FINDINGS  There are occasional small calcified spur hepatic granulomas. Liver otherwise appears unremarkable. No liver masses are identified. Gallbladder wall is not appreciably thickened. There is no biliary duct dilatation.  There are calcified splenic  granulomas. Spleen otherwise appears normal.  Pancreas and adrenals appear normal. Kidneys bilaterally show no mass or hydronephrosis on either side. There is no renal or ureteral calculus on either side.  In the pelvis, the urinary bladder is midline with normal wall thickness. There is no new pelvic mass or fluid collection. There is fat in each inguinal ring. There are sigmoid diverticula without diverticulitis. There are prostatic calculi present.  Appendix appears normal. Terminal ileum appears normal. There is lipomatous infiltration of the ileocecal valve.  There is no bowel obstruction. No free air or portal venous air. There is no appreciable ascites, adenopathy, or abscess in the abdomen or pelvis. There is atherosclerotic change in the aorta but no aneurysm. There are no blastic or lytic bone lesions.  IMPRESSION: CT chest: No edema or consolidation. No adenopathy. There is a 2 mm nodular opacity in the posterior segment right upper lobe. Followup of this nodular opacity should be based on Fleischner Society guidelines. If the patient is at high risk for bronchogenic carcinoma, follow-up chest CT at 1 year is recommended. If the patient is at low risk, no follow-up is needed. This recommendation follows the consensus statement: Guidelines for Management of Small Pulmonary Nodules Detected on CT Scans: A Statement from the [place] as published in Radiology 2005; 237:395-400. No other parenchymal lung nodular lesions seen. There are multiple foci of coronary artery calcification.  CT abdomen and pelvis: There is no neoplastic focus appreciable. There is no adenopathy. There are granulomas in the liver and spleen. There is no bowel obstruction. No abscess. There is sigmoid diverticulosis without diverticulitis. There are prostatic calculi present. There is lipomatous infiltration of the ileocecal valve.   Electronically Signed   By: Lowella Grip M.D.   On: 07/02/2014 19:17   Mr [name] DX  Contrast  07/02/2014   ADDENDUM REPORT: 07/02/2014 21:05  ADDENDUM: Disregard the above report. Wrong patient. See correct report below.  EXAM: MRI HEAD WITHOUT AND WITH CONTRAST  CLINICAL DATA:  Two weeks headache and dizziness.  Abnormal CT.  CONTRAST:  20 mL MultiHance IV  COMPARISON:  CT head 07/02/2014  FINDINGS: FINDINGS  Two enhancing mass lesions in the right parietal lobe with extensive surrounding white matter edema. Right lower parietal lobe mass measures 22 x 20 mm with irregular enhancement and central nonenhancing necrotic material. Larger right parietal enhancing mass lesion measures 53 x 28 mm and has irregular wall enhancement and central necrosis. There is edema/tumor extending into the splenium of the corpus callosum and anteriorly into the temporal white matter. There is considerable mass-effect on the right occipital horn which is displaced anteriorly. There is mild midline shift of approximately 7 mm to the left.  Ventricle size is normal.  3 cm mass in the right parotid gland with complex signal characteristics. This could represent a benign or malignant neoplasm in the left parotid gland. Based on the imaging characteristics in the right parietal lesion, these are likely two separate processes.  Subcentimeter focus of restricted diffusion in the left frontal subcortical white matter, probably a small area of acute infarction. This  does not enhance. Less likely is an area of nonenhancing tumor .  IMPRESSION:  Two adjacent enhancing mass lesions in the right parietal lobe with central necrosis and extensive surrounding edema and mass effect. Hyperintensity extends into the splenium of the corpus callosum. 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 Metastatic disease is a less likely consideration. 7 mm midline shift to the left.  Subcentimeter focus of restricted diffusion in the left frontal lobe likely an area of acute or subacute infarction.  3 cm complex mass right  parotid gland likely a neoplasm.   Electronically Signed   By: Franchot Gallo M.D.   On: 07/02/2014 21:05   07/02/2014   CLINICAL DATA:  Postop tumor resection 07/01/2014. Now with left leg weakness. 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  EXAM: MRI HEAD WITHOUT AND WITH CONTRAST  TECHNIQUE: Multiplanar, multiecho pulse sequences of the brain and surrounding structures were obtained without and with intravenous contrast.  CONTRAST:  20 mL MultiHance IV  COMPARISON:  MRI 06/15/2014  FINDINGS: Postop day 1 bilateral parietal craniotomy for bilateral meningioma resection.  Right parasagittal meningioma resection. Surgical cavity is filled with blood on the right. There is enhancement of the wall of the surgical cavity without masslike features. Although it is only been 24 hr, this enhancement pattern may be postop enhancement rather than residual tumor. Continued close follow-up recommended.  Left parasagittal meningioma resection. Postop surgical cavity containing blood. Mild peripheral enhancement around the cavity similar that seen on the right may be postop enhancement.  Diffusion-weighted imaging reveals several areas of restricted diffusion consistent with acute infarct. Small area of acute infarction in the right parietal cortex, several cm from the resection site. Small areas of restricted diffusion in the frontal lobes bilaterally compatible with small areas of infarction. Restricted diffusion is also present over the convexity bilaterally near the resection site most consistent with infarct adjacent to the tumor resection site. This measures approximately 10 x 15 mm on the right and 15 mm by 20 mm on the left.  Calcified meningioma along the falx measuring approximate 1 cm is unchanged and was not resected.  Ventricle size is normal.  No shift of the midline structures.  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 Post  surgical cavity filled with blood bilaterally. There is thin linear enhancement along the wall of the surgical cavity bilaterally likely due to early postoperative enhancement. Continued followup is suggested to rule out residual tumor.  Acute infarction adjacent to the resection cavity bilaterally. Also remote small areas of infarct in the right parietal lobe and bilateral frontal lobes. Question emboli  Electronically Signed: By: Franchot Gallo M.D. On: 07/02/2014 20:16   Ct Abdomen Pelvis W Contrast  07/02/2014   CLINICAL DATA:  Patient will brain tumor and left-sided weakness  EXAM: CT CHEST, ABDOMEN, AND PELVIS WITH CONTRAST  TECHNIQUE: Multidetector CT imaging of the chest, abdomen and pelvis was performed following the standard protocol during bolus administration of intravenous contrast. Oral contrast was administered for this CT abdomen and pelvis studies.  CONTRAST:  153mL OMNIPAQUE IOHEXOL 300 MG/ML  SOLN  COMPARISON:  None.  FINDINGS: CT CHEST FINDINGS  There is minimal scarring in the inferior lingula. There is no lung edema or consolidation. On axial slice 18 series 4, there is a 2 mm nodular opacity in the posterior segment of the right upper lobe. The lungs are otherwise clear. There is no appreciable thoracic adenopathy. There are  a few small mediastinal lymph nodes which do not meet size criteria for pathologic significance. There are small calcified sub- carinal and left hilar lymph nodes consistent with prior granulomatous disease.  There are multiple foci of coronary artery calcification. Pericardium is not thickened.  There are no blastic or lytic bone lesions in the thoracic region. Visualized thyroid appears normal.  CT ABDOMEN AND PELVIS FINDINGS  There are occasional small calcified spur hepatic granulomas. Liver otherwise appears unremarkable. No liver masses are identified. Gallbladder wall is not appreciably thickened. There is no biliary duct dilatation.  There are calcified splenic  granulomas. Spleen otherwise appears normal.  Pancreas and adrenals appear normal. Kidneys bilaterally show no mass or hydronephrosis on either side. There is no renal or ureteral calculus on either side.  In the pelvis, the urinary bladder is midline with normal wall thickness. There is no new pelvic mass or fluid collection. There is fat in each inguinal ring. There are sigmoid diverticula without diverticulitis. There are prostatic calculi present.  Appendix appears normal. Terminal ileum appears normal. There is lipomatous infiltration of the ileocecal valve.  There is no bowel obstruction. No free air or portal venous air. There is no appreciable ascites, adenopathy, or abscess in the abdomen or pelvis. There is atherosclerotic change in the aorta but no aneurysm. There are no blastic or lytic bone lesions.  IMPRESSION: CT chest: No edema or consolidation. No adenopathy. There is a 2 mm nodular opacity in the posterior segment right upper lobe. Followup of this nodular opacity should be based on Fleischner Society guidelines. If the patient is at high risk for bronchogenic carcinoma, follow-up chest CT at 1 year is recommended. If the patient is at low risk, no follow-up is needed. This recommendation follows the consensus statement: Guidelines for Management of Small Pulmonary Nodules Detected on CT Scans: A Statement from the [place] as published in Radiology 2005; 237:395-400. No other parenchymal lung nodular lesions seen. There are multiple foci of coronary artery calcification.  CT abdomen and pelvis: There is no neoplastic focus appreciable. There is no adenopathy. There are granulomas in the liver and spleen. There is no bowel obstruction. No abscess. There is sigmoid diverticulosis without diverticulitis. There are prostatic calculi present. There is lipomatous infiltration of the ileocecal valve.   Electronically Signed   By: Lowella Grip M.D.   On: 07/02/2014 19:17     Assessment/Plan: MRI of the brain shows a ring-enhancing lesion read out as to possible lesions however I wonder if they actually are contiguous lesions. The luminary report on CT chest abdomen pelvis no definite cancerous lesion visualized. Will await complete workup plan craniotomy for resection of occipital parietal mass on Wednesday I feel that there is a strong likelihood that this represents a high-grade glioma.okay from my perspective for patient be transferred to the floor continue Decadron  LOS: 1 day     Di Jasmer P 07/03/2014, 10:09 AM

## 2014-07-03 NOTE — Progress Notes (Signed)
Patient transferred to the floor from [place], patient alert and oriented x 4.family with patient. Both patient and family oriented to the room. Will continue to monitor.

## 2014-07-03 NOTE — Progress Notes (Signed)
Report called to RN on 4N. Pt and family aware of transfer. VS stable.

## 2014-07-04 DIAGNOSIS — E039 Hypothyroidism, unspecified: Secondary | ICD-10-CM

## 2014-07-04 DIAGNOSIS — I10 Essential (primary) hypertension: Secondary | ICD-10-CM | POA: Insufficient documentation

## 2014-07-04 NOTE — Progress Notes (Signed)
Patient ID: Jake [name]) [place], male   DOB: Oct 06, 1955, 58 y.o.   MRN: [id number] Patient well significant improvement on steroids  Still slight left-sided pronator drift  Plan OR Thursday morning for craniotomy for resection

## 2014-07-04 NOTE — Progress Notes (Signed)
Family Medicine Teaching Service Daily Progress Note Intern Pager: [phone]  Patient name: Jake [name]) [place] record number: [id number] Date of birth: October 27, 1955 Age: 58 y.o. Gender: male  Primary Care Provider: [name], MD Consultants: neurosurg Code Status: full  Pt Overview and Major Events to Date:  10/31 - admitted with left hemiparesis found to have brain mass concerning for GBM  Assessment and Plan:  Jake [name] is a 58 y.o. 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 PMH is significant for hypertension  #Right posterior parietal brain mass: appears to be a primary brain lesion. No signs of increased intracranial pressure. Neurosurgery has seen and made recommendations for probable surgery on Wednesday (11/4). Patient still with left sided deficits with associated hyperreflexia.  CT chest, abdomen, pelvis significant for 76mm lung nodule, but otherwise negative for neoplasm. - neurosurgery following, appreciate recs - will touch base today re further workup necessary - frequent neuro checks - continue dexamethasone 10mg  q6hrs - Monitoring CBGs while on Dexamethasone - Continue to monitor on telemetry - PT/OT evals ordered - Will touch base with Oncology today, will likely want outpatient follow-up after stabilized  # Hypertension: on lisinopril and verapamil at home. Currently normotensive to slightly hypertensive - Continue lisinopril and verapamil (as immediate release q8hrs while inpateint)  # Hypothyroidism: currently on synthroid 64mcg. Last TSH wnl at 3.263 on 12/2013. - Continue synthroid 48mcg daily  FEN/GI: Heart healthy, KVO Prophylaxis: heparin subq  Disposition: Pending further work-up  Subjective:  Reporting improvement in L hemiparesis this am.  Does think that it takes a long time for his L arm to respond to commands.   Objective: Temp:  [97.7 F (36.5 C)-99.5 F (37.5 C)] 99.5 F  (37.5 C) (11/02 0931) Pulse Rate:  [61-106] 93 (11/02 0931) Resp:  [16-22] 20 (11/02 0931) BP: (136-177)/(71-91) 177/71 mmHg (11/02 0931) SpO2:  [94 %-100 %] 94 % (11/02 0931) Physical Exam: General: Well appearing, in no acute distress, sitting in bedside chair HEENT: NCAT, MMM Cardiovascular: RRR, no murmur Respiratory: CTAB without wheezing Abdomen: Soft, non-tender, non-distended, +BS Extremities: No edema or tenderness Neuro: Alert, oriented x3, CN3-12 intact. 4+/5 LUE strength and 4+/5 LLE strength compared to 5/5 RUE and RLE strength.  Laboratory:  Recent Labs Lab 07/02/14 1518 07/02/14 1548  WBC  --  6.4  HGB 16.7 15.2  HCT 49.0 44.5  PLT  --  198    Recent Labs Lab 07/02/14 1518 07/02/14 1548  NA 136* 136*  K 4.0 4.1  CL 102 100  CO2  --  24  BUN 12 12  CREATININE 0.80 0.85  CALCIUM  --  9.3  PROT  --  6.9  BILITOT  --  0.5  ALKPHOS  --  57  ALT  --  20  AST  --  22  GLUCOSE 102* 96    Imaging/Diagnostic Tests: CT chest (10/31): No edema or consolidation. No adenopathy. There is a 2 mm nodular opacity in the posterior segment right upper lobe.   CT abdomen and pelvis (10/31): There is no neoplastic focus appreciable. There is no adenopathy. There are granulomas in the liver and spleen. There is no bowel obstruction. No abscess. There is sigmoid diverticulosis without diverticulitis. There are prostatic calculi present. There is lipomatous infiltration of the ileocecal valve.  CT Head (10/31): Changes consistent with a focal neoplasm within the right posterior parietal region with significant surrounding white matter edema and midline shift. MRI with contrast material is  recommended for further evaluation.  MRI Brain (10/31): Two adjacent enhancing mass lesions in the right parietal lobe with central necrosis and extensive surrounding edema and mass effect. Hyperintensity extends into the splenium of the corpus callosum. The pattern is most  consistent with glioblastoma multiform. Metastatic disease is a less likely consideration. 7 mm midline shift to the left.  Subcentimeter focus of restricted diffusion in the left frontal lobe likely an area of acute or subacute infarction.  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   Jake Paganini, MD 07/04/2014, 9:42 AM PGY-1, [place] Intern pager: [phone], text pages welcome

## 2014-07-04 NOTE — Evaluation (Signed)
Physical Therapy Evaluation Patient Details Name: Jake [name] MRN: [id number] DOB: 31-May-1956 Today's Date: 07/04/2014   History of Present Illness  Adm 07/02/14 after several week history of Lt weakness, clumsiness, running into things on the right. CT scan showed a right sided parietal occipital mass with extensive vasogenic edema. Plans for craniotomy 07/06/14. PMHx- HTN  Clinical Impression  Patient evaluated by Physical Therapy with no further acute PT needs at this time. Pt is safe to ambulate with nursing. Please re-order PT after surgery 07/06/14 for further gait and balance assessment. Pt currently scored 41/56 on Berg Balance Assessment. All education has been completed and the patient has no further questions. PT is signing off. Thank you for this referral.     Follow Up Recommendations Other (comment) (TBA after surgery)    Equipment Recommendations  None recommended by PT    Recommendations for Other Services       Precautions / Restrictions Precautions Precautions: Fall      Mobility  Bed Mobility               General bed mobility comments: not observed; EOB eating on arrival  Transfers Overall transfer level: Needs assistance Equipment used: None Transfers: Sit to/from Stand Sit to Stand: Supervision         General transfer comment: shifts over his RLE to come to stand and remains over his RLE; able to come to midline with PT in front of him and cues  Ambulation/Gait Ambulation/Gait assistance: Min guard Ambulation Distance (Feet): 175 Feet Assistive device: None Gait Pattern/deviations: Step-through pattern;Decreased step length - left;Decreased weight shift to right;Drifts right/left;Wide base of support Gait velocity: decr Gait velocity interpretation: Below normal speed for age/gender General Gait Details: pt self-selects slower speed and states he usually walks much faster; even with cues to incr velocity, he does not significantly  incr; with fatigue, he leans and drifts to Left; with head turn to Rt, he drifts to Rt; vc for visual scanning to avoid objects on his Lt  Stairs            Wheelchair Mobility    Modified Rankin (Stroke Patients Only) Modified Rankin (Stroke Patients Only) Pre-Morbid Rankin Score: No symptoms Modified Rankin: Moderately severe disability     Balance Overall balance assessment: Needs assistance Sitting-balance support: No upper extremity supported;Feet unsupported Sitting balance-Leahy Scale: Normal     Standing balance support: No upper extremity supported Standing balance-Leahy Scale: Good                   Standardized Balance Assessment Standardized Balance Assessment : Berg Balance Test Berg Balance Test Sit to Stand: Able to stand  independently using hands Standing Unsupported: Able to stand safely 2 minutes Sitting with Back Unsupported but Feet Supported on Floor or Stool: Able to sit safely and securely 2 minutes Stand to Sit: Sits safely with minimal use of hands Transfers: Able to transfer safely, definite need of hands Standing Unsupported with Eyes Closed: Able to stand 10 seconds safely Standing Ubsupported with Feet Together: Needs help to attain position but able to stand for 30 seconds with feet together From Standing, Reach Forward with Outstretched Arm: Can reach forward >12 cm safely (5") From Standing Position, Pick up Object from Floor: Able to pick up shoe, needs supervision From Standing Position, Turn to Look Behind Over each Shoulder: Looks behind from both sides and weight shifts well Turn 360 Degrees: Able to turn 360 degrees safely but slowly (9 steps  to turn 360) Standing Unsupported, Alternately Place Feet on Step/Stool: Able to complete >2 steps/needs minimal assist Standing Unsupported, One Foot in Front: Able to plae foot ahead of the other independently and hold 30 seconds Standing on One Leg: Able to lift leg independently and  hold equal to or more than 3 seconds Total Score: 41         Pertinent Vitals/Pain Pain Assessment: No/denies pain    Home Living Family/patient expects to be discharged to:: Private residence Living Arrangements: Spouse/significant other Available Help at Discharge: Family;Available PRN/intermittently (works; can flex schedule) Type of Home: House Home Access: Stairs to enter [employer]: None (post at top of steps) Entrance Stairs-Number of Steps: 5 Home Layout: One level Home Equipment: None      Prior Function Level of Independence: Independent         Comments: Human resources officer Dominance   Dominant Hand: Right    Extremity/Trunk Assessment   Upper Extremity Assessment: Defer to OT evaluation           Lower Extremity Assessment: LLE deficits/detail   LLE Deficits / Details: on strength testing 5/5, however demonstrating decr muscular endurance vs inattention as pt begins to decr step length and lean Lt with ambulating >100 ft  Cervical / Trunk Assessment: Other exceptions  Communication   Communication: No difficulties  Cognition Arousal/Alertness: Awake/alert Behavior During Therapy: WFL for tasks assessed/performed Overall Cognitive Status: Impaired/Different from baseline Area of Impairment: Attention;Safety/judgement;Awareness   Current Attention Level: Alternating     Safety/Judgement: Decreased awareness of deficits;Decreased awareness of safety Awareness: Emergent   General Comments: Lt inattention noted despite cues to scan Lt visual field (not noticing Lt foot tangled in blankets prior to attempting to stand; not lifting Lt arm when gait belt placed around him; only lifting RLE when tray table placed near him    General Comments      Exercises Other Exercises Other Exercises: Encouraged use of LUE for picking up objects, etc. Instructed to ambulate with nursing assist up to 3x/day.       Assessment/Plan    PT Assessment Patent does not need any further PT services (at this time; please reorder after surgery)  PT Diagnosis Hemiplegia non-dominant side   PT Problem List    PT Treatment Interventions     PT Goals (Current goals can be found in the Care Plan section) Acute Rehab PT Goals Patient Stated Goal: get his strength and balance back PT Goal Formulation: All assessment and education complete, DC therapy    Frequency     Barriers to discharge        Co-evaluation               End of Session Equipment Utilized During Treatment: Gait belt Activity Tolerance: Patient tolerated treatment well Patient left: in chair;with call bell/phone within reach Nurse Communication: Mobility status         Time: 3817-7116 PT Time Calculation (min): 27 min   Charges:   PT Evaluation $Initial PT Evaluation Tier I: 1 Procedure PT Treatments $Gait Training: 8-22 mins   PT G Codes:          Daylyn Azbill 23-Jul-2014, 9:26 AM  Pager [phone]

## 2014-07-04 NOTE — Evaluation (Signed)
Occupational Therapy Evaluation Patient Details Name: Jake [name] MRN: [id number] DOB: 17-Jun-1956 Today's Date: 07/04/2014    History of Present Illness Adm 07/02/14 after several week history of Lt weakness, clumsiness, running into things on the right. CT scan showed a right sided parietal occipital mass with extensive vasogenic edema. Plans for craniotomy 07/06/14. 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 Pt currently with functional limitiations due to the deficits listed below (see OT problem list). PTA independent with all ADLs and IADLs. Pt with new onset of visual changes. Pt will benefit from skilled OT to increase their independence and safety with adls and balance to allow discharge to be determined s/p Wednesday surgery and reorder. No follow up at this date.     Follow Up Recommendations  Other (comment) (TBA after surg)    Equipment Recommendations       Recommendations for Other Services       Precautions / Restrictions Precautions Precautions: Fall      Mobility Bed Mobility               General bed mobility comments: not observed; EOB eating on arrival  Transfers Overall transfer level: Needs assistance   Transfers: Sit to/from Stand Sit to Stand: Supervision         General transfer comment: pt demonstrates LLE weakness with lateral Lean    Balance Overall balance assessment: Needs assistance         Standing balance support: No upper extremity supported;During functional activity Standing balance-Leahy Scale: Good Standing balance comment: pt with lateral lean onto the R Le due to fatigue on the L LE                   Berg Balance Test Turn 360 Degrees:  (9 steps to turn 360)        ADL Overall ADL's : Needs assistance/impaired Eating/Feeding: Independent Eating/Feeding Details (indicate cue type and reason): opening candy with L UE only  for stability Grooming: Oral care;Min guard;Standing Grooming Details (indicate cue type and reason): cues for sequence                 Toilet Transfer: Supervision/safety           Functional mobility during ADLs: Minimal assistance General ADL Comments: pt demonstrates compensatory stragety of L lateral tilt to read and scan reading material. pt reports "I have been blaming my glasses for the longest time" Pt demonstrates decr awareness to the L side by walking into objects to the L and verbalzied "I have to turn my head to the left so I dont walk into anything" Pt furniture walking extending out R arm to feel and grab for environment. Pt and wife educated on the need to use RW with v/c for L hand use on RW. Pt completed several visual assessments to help establish a baseline prior to surg Wednesday of vision.     Vision Eye Alignment: Impaired (comment) Alignment/Gaze Preference: Head tilt (L side head tilt) Ocular Range of Motion: Impaired-to be further tested in functional context Tracking/Visual Pursuits: Decreased smoothness of horizontal tracking;Requires cues, head turns, or add eye shifts to track;Unable to hold eye position out of midline;Impaired - to be further tested in functional context Saccades: Additional eye shifts occurred during testing;Additional head turns occurred during testing;Decreased speed of saccadic movement Convergence: Impaired (comment)     Additional Comments: Pt demonstrates: decr  L side figure ground with drawing a flower, pt must lean L to write or read, pt able to sequence A-1-B-2 with incr time and effort. Pt and wife educated on "eye can learn" website.    Perception Perception Perception Tested?: Yes Perception Deficits: Inattention/neglect Inattention/Neglect: Does not attend to left side of body;Does not attend to left visual field Spatial deficits: decr awareness to L UE in space Comments: Pt asked to squeeze therapist hands. pt  lifting bil UE initially and only squeezing with R Ue. pt with no awareness to error. Pt asked to visually attention with mod cueing and once visually attending squeezing with equal strength.   Praxis      Pertinent Vitals/Pain Pain Assessment: No/denies pain     Hand Dominance Right   Extremity/Trunk Assessment Upper Extremity Assessment Upper Extremity Assessment: LUE deficits/detail LUE Deficits / Details: L inattention, decr fine motor, requires visual attention for strength LUE Coordination: decreased fine motor   Lower Extremity Assessment Lower Extremity Assessment: Defer to PT evaluation   Cervical / Trunk Assessment Cervical / Trunk Assessment: Other exceptions Cervical / Trunk Exceptions: pt with L lateral lean due to visual deficits. pt with R weight shift with static standing at sink    Communication Communication Communication: No difficulties   Cognition Arousal/Alertness: Awake/alert Behavior During Therapy: WFL for tasks assessed/performed Overall Cognitive Status: Impaired/Different from baseline Area of Impairment: Attention;Safety/judgement;Awareness   Current Attention Level: Alternating     Safety/Judgement: Decreased awareness of deficits;Decreased awareness of safety Awareness: Emergent   General Comments: L inattention    General Comments       Exercises   Other Exercises Other Exercises: encouraged to complete eye exercises to help with strengthening until wednesday surgery and bil Ue task . Wife present for all education   Shoulder Instructions      Home Living Family/patient expects to be discharged to:: Private residence Living Arrangements: Spouse/significant other Available Help at Discharge: Family;Available PRN/intermittently (works; can flex schedule) Type of Home: House Home Access: Stairs to enter [employer] of Steps: 5 Entrance Stairs-Rails: None (post at top of steps) Home Layout: One level                Home Equipment: None          Prior Functioning/Environment Level of Independence: Independent        Comments: Archivist    OT Diagnosis: Generalized weakness;Cognitive deficits   OT Problem List: Impaired balance (sitting and/or standing);Impaired vision/perception;Decreased coordination;Decreased cognition;Decreased safety awareness   OT Treatment/Interventions:      OT Goals(Current goals can be found in the care plan section) Acute Rehab OT Goals Patient Stated Goal: to keep whatever strength I can OT Goal Formulation: With patient/family  OT Frequency:     Barriers to D/C:            Co-evaluation              End of Session Nurse Communication: Mobility status;Precautions  Activity Tolerance: Patient tolerated treatment well Patient left: in chair;with call bell/phone within reach;with family/visitor present   Time: 9678-9381 OT Time Calculation (min): 32 min Charges:  OT General Charges $OT Visit: 1 Procedure OT Evaluation $Initial OT Evaluation Tier I: 1 Procedure OT Treatments $Self Care/Home Management : 23-37 mins G-Codes:    Peri Maris Jul 08, 2014, 2:18 PM Pager: [phone]

## 2014-07-04 NOTE — Progress Notes (Signed)
CARE MANAGEMENT NOTE 07/04/2014  Patient:  Jake [name], Jake [name] [facility])   Account Number:  [id number]  Date Initiated:  07/04/2014  Documentation initiated by:  Olga Coaster  Subjective/Objective Assessment:   ADMITTED WITH BRAIN TUMOR/ FOR SURGERY 07/06/2014     Action/Plan:   CM FOLLOWING FOR DCP   Anticipated DC Date:  07/11/2014   Anticipated DC Plan:  CM FOLLOWING FOR DCP; PATIENT IS FOR SURGERY 07/06/2014     DC Planning Services  CM consult         Status of service:  In process, will continue to follow Medicare Important Message given?   (If response is "NO", the following Medicare IM given date fields will be blank)  Per UR Regulation:  Reviewed for med. necessity/level of care/duration of stay Comments:  11/2/2015Mindi Slicker RN,BSN,MHA [phone]

## 2014-07-05 LAB — GLUCOSE, CAPILLARY
GLUCOSE-CAPILLARY: 126 mg/dL — AB (ref 70–99)
Glucose-Capillary: 169 mg/dL — ABNORMAL HIGH (ref 70–99)

## 2014-07-05 MED ORDER — VERAPAMIL HCL 180 MG (CO) PO TB24
180.0000 mg | ORAL_TABLET | Freq: Every day | ORAL | Status: DC
  Administered 2014-07-07 – 2014-07-08 (×3): 180 mg via ORAL
  Filled 2014-07-05 (×2): qty 1

## 2014-07-05 MED ORDER — POLYETHYLENE GLYCOL 3350 17 G PO PACK
17.0000 g | PACK | Freq: Every day | ORAL | Status: DC
  Administered 2014-07-05: 17 g via ORAL
  Filled 2014-07-05: qty 1

## 2014-07-05 MED ORDER — ASPIRIN EC 81 MG PO TBEC
81.0000 mg | DELAYED_RELEASE_TABLET | Freq: Every day | ORAL | Status: DC
  Administered 2014-07-09: 81 mg via ORAL

## 2014-07-05 MED ORDER — LEVOTHYROXINE SODIUM 50 MCG PO TABS
50.0000 ug | ORAL_TABLET | Freq: Every day | ORAL | Status: DC
  Administered 2014-07-08 – 2014-07-09 (×2): 50 ug via ORAL
  Filled 2014-07-05: qty 1

## 2014-07-05 MED ORDER — PANTOPRAZOLE SODIUM 40 MG PO TBEC
40.0000 mg | DELAYED_RELEASE_TABLET | Freq: Every day | ORAL | Status: DC
  Administered 2014-07-05 – 2014-07-09 (×4): 40 mg via ORAL
  Filled 2014-07-05 (×4): qty 1

## 2014-07-05 MED ORDER — LISINOPRIL 20 MG PO TABS
40.0000 mg | ORAL_TABLET | Freq: Every day | ORAL | Status: DC
  Administered 2014-07-08: 40 mg via ORAL
  Administered 2014-07-09: 20 mg via ORAL

## 2014-07-05 NOTE — Progress Notes (Signed)
Family Medicine Teaching Service Daily Progress Note Intern Pager: [phone]  Patient name: Jake [name]) [place] record number: [id number] Date of birth: Aug 22, 1956 Age: 58 y.o. Gender: male  Primary Care Provider: [name], MD Consultants: neurosurg Code Status: full  Pt Overview and Major Events to Date:  10/31 - admitted with left hemiparesis found to have brain mass concerning for GBM  Assessment and Plan:  Jake [name] is a 58 y.o. 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 PMH is significant for hypertension  #Right posterior parietal brain mass: appears to be a primary brain lesion. No signs of increased intracranial pressure. Patient still with left sided deficits with associated hyperreflexia.  CT chest, abdomen, pelvis significant for 35mm lung nodule, but otherwise negative for neoplasm. - neurosurgery following, appreciate recs - plan for craniotomy and resection 11/5 - frequent neuro checks - continue dexamethasone 10mg  q6hrs - Monitoring CBGs while on Dexamethasone - Continue to monitor on telemetry - PT/OT evals ordered - reconsult after surgery  # Hypertension: on lisinopril and verapamil at home. BPs labile, but mostly normot ensive to slightly hypertensive. - Continue lisinopril and verapamil (as immediate release q8hrs while inpateint)  # Hypothyroidism: currently on synthroid 53mcg. Last TSH wnl at 3.263 on 12/2013. - Continue synthroid 53mcg daily  FEN/GI: Heart healthy, KVO Prophylaxis: heparin subq  Disposition: Pending further work-up  Subjective:  Reporting improvement in L hemiparesis this am.  Does think that it takes a long time for his L arm to respond to commands.  Is aware of some L sided neglect  Objective: Temp:  [97.7 F (36.5 C)-98 F (36.7 C)] 97.7 F (36.5 C) (11/03 0223) Pulse Rate:  [62-86] 63 (11/03 0223) Resp:  [20] 20 (11/03 0223) BP: (119-144)/(55-83) 139/83 mmHg  (11/03 0937) SpO2:  [94 %-100 %] 97 % (11/03 0223) Physical Exam: General: Well appearing, in no acute distress, sitting in bedside chair HEENT: NCAT, MMM Cardiovascular: RRR, no murmur Respiratory: CTAB without wheezing Abdomen: Soft, non-tender, non-distended, +BS Extremities: No edema or tenderness Neuro: Alert, oriented x3, CN3-12 intact. 4+/5 LUE strength and 4+/5 LLE strength compared to 5/5 RUE and RLE strength.  Laboratory:  Recent Labs Lab 07/02/14 1518 07/02/14 1548  WBC  --  6.4  HGB 16.7 15.2  HCT 49.0 44.5  PLT  --  198    Recent Labs Lab 07/02/14 1518 07/02/14 1548  NA 136* 136*  K 4.0 4.1  CL 102 100  CO2  --  24  BUN 12 12  CREATININE 0.80 0.85  CALCIUM  --  9.3  PROT  --  6.9  BILITOT  --  0.5  ALKPHOS  --  57  ALT  --  20  AST  --  22  GLUCOSE 102* 96    Imaging/Diagnostic Tests: CT chest (10/31): No edema or consolidation. No adenopathy. There is a 2 mm nodular opacity in the posterior segment right upper lobe.   CT abdomen and pelvis (10/31): There is no neoplastic focus appreciable. There is no adenopathy. There are granulomas in the liver and spleen. There is no bowel obstruction. No abscess. There is sigmoid diverticulosis without diverticulitis. There are prostatic calculi present. There is lipomatous infiltration of the ileocecal valve.  CT Head (10/31): Changes consistent with a focal neoplasm within the right posterior parietal region with significant surrounding white matter edema and midline shift. MRI with contrast material is recommended for further evaluation.  MRI Brain (10/31): Two adjacent enhancing mass lesions  in the right parietal lobe with central necrosis and extensive surrounding edema and mass effect. Hyperintensity extends into the splenium of the corpus callosum. 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 Metastatic disease is a less likely consideration. 7 mm midline shift to  the left.  Subcentimeter focus of restricted diffusion in the left frontal lobe likely an area of acute or subacute infarction.  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   [name], MD 07/05/2014, 9:44 AM PGY-1, [place] Intern pager: [phone], text pages welcome

## 2014-07-06 LAB — TYPE AND SCREEN
ABO/RH(D): O NEG
Antibody Screen: NEGATIVE

## 2014-07-06 LAB — GLUCOSE, CAPILLARY
GLUCOSE-CAPILLARY: 114 mg/dL — AB (ref 70–99)
GLUCOSE-CAPILLARY: 121 mg/dL — AB (ref 70–99)
GLUCOSE-CAPILLARY: 116 mg/dL — AB (ref 70–99)
Glucose-Capillary: 156 mg/dL — ABNORMAL HIGH (ref 70–99)

## 2014-07-06 LAB — PROTIME-INR
INR: 0.99 (ref 0.00–1.49)
Prothrombin Time: 13.2 seconds (ref 11.6–15.2)

## 2014-07-06 LAB — ABO/RH: ABO/RH(D): O NEG

## 2014-07-06 MED ORDER — SENNOSIDES-DOCUSATE SODIUM 8.6-50 MG PO TABS
1.0000 | ORAL_TABLET | Freq: Every day | ORAL | Status: DC
  Administered 2014-07-06 – 2014-07-09 (×3): 1 via ORAL
  Filled 2014-07-06 (×3): qty 1

## 2014-07-06 MED ORDER — POLYETHYLENE GLYCOL 3350 17 G PO PACK
17.0000 g | PACK | Freq: Every day | ORAL | Status: DC
  Administered 2014-07-06 – 2014-07-09 (×3): 17 g via ORAL
  Filled 2014-07-06 (×3): qty 1

## 2014-07-06 MED ORDER — SODIUM CHLORIDE 0.9 % IV SOLN
INTRAVENOUS | Status: DC
  Administered 2014-07-06 – 2014-07-09 (×4): via INTRAVENOUS

## 2014-07-06 MED ORDER — POLYETHYLENE GLYCOL 3350 17 G PO PACK: 17.0000 g | PACK | Freq: Two times a day (BID) | ORAL | Status: DC

## 2014-07-06 NOTE — Progress Notes (Signed)
Spoke to patient to clarify code status in setting of major surgery in AM.  Patient still wishes to be full code and understands the risks.  [name], MD, MPH PGY-1,  [facility] 07/06/2014 11:23 AM

## 2014-07-06 NOTE — Progress Notes (Signed)
Family Medicine Teaching Service Daily Progress Note Intern Pager: [phone]  Patient name: Jake [name]) [place] record number: [id number] Date of birth: 1956/02/16 Age: 58 y.o. Gender: male  Primary Care Provider: [name], MD Consultants: neurosurg Code Status: full  Pt Overview and Major Events to Date:  10/31 - admitted with left hemiparesis found to have brain mass concerning for GBM  Assessment and Plan:  Jake [name] is a 58 y.o. 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 PMH is significant for hypertension  #Right posterior parietal brain mass: appears to be a primary brain lesion. No signs of increased intracranial pressure. Patient still with left sided deficits with associated hyperreflexia.  CT chest, abdomen, pelvis significant for 41mm lung nodule, but otherwise negative for neoplasm. - neurosurgery following, appreciate recs - plan for craniotomy and resection 11/5 - continue dexamethasone 10mg  q6hrs - Monitoring CBGs while on Dexamethasone - Continue to monitor on telemetry - PT/OT evals ordered - reconsult after surgery  # Hypertension: on lisinopril and verapamil at home. BPs normotensive to slightly hypertensive. - Continue lisinopril and verapamil (as immediate release q8hrs while inpateint)  # Hypothyroidism: currently on synthroid 5mcg. Last TSH wnl at 3.263 on 12/2013. - Continue synthroid 64mcg daily  # Constipation: Patient without BM since admission - Increase Miralax to BID - Start senna-docusate  FEN/GI: Heart healthy, NPO p MN for surgery in AM, KVO Prophylaxis: heparin subq  Disposition: Pending craniotomy and stabilization post-op  Subjective:  Strength has returned almost fully to L side. Able to walk around the unit yesterday. Still experiencing some loss of L peripheral vision and slow response time to commands on L side.  Objective: Temp:  [98.1 F (36.7 C)-98.7 F (37.1  C)] 98.1 F (36.7 C) (11/04 0511) Pulse Rate:  [58-95] 58 (11/04 0511) Resp:  [18-22] 18 (11/04 0511) BP: (120-152)/(63-83) 120/74 mmHg (11/04 0511) SpO2:  [94 %-98 %] 97 % (11/04 0511) Physical Exam: General: Well appearing, NAD, sitting in bedside chair HEENT: NCAT, MMM Cardiovascular: RRR, no murmur Respiratory: CTAB without wheezing Abdomen: Soft, NTND, +BS Extremities: No edema or tenderness Neuro: Alert, oriented x3, CN3-12 intact. Strength 5/5 throughout UE/LEs. Sensation grossly intact to light touch. Slight L pronator drift. Some loss of L sided peripheral vision.  Laboratory:  Recent Labs Lab 07/02/14 1518 07/02/14 1548  WBC  --  6.4  HGB 16.7 15.2  HCT 49.0 44.5  PLT  --  198    Recent Labs Lab 07/02/14 1518 07/02/14 1548  NA 136* 136*  K 4.0 4.1  CL 102 100  CO2  --  24  BUN 12 12  CREATININE 0.80 0.85  CALCIUM  --  9.3  PROT  --  6.9  BILITOT  --  0.5  ALKPHOS  --  57  ALT  --  20  AST  --  22  GLUCOSE 102* 96    Imaging/Diagnostic Tests: CT chest (10/31): No edema or consolidation. No adenopathy. There is a 2 mm nodular opacity in the posterior segment right upper lobe.   CT abdomen and pelvis (10/31): There is no neoplastic focus appreciable. There is no adenopathy. There are granulomas in the liver and spleen. There is no bowel obstruction. No abscess. There is sigmoid diverticulosis without diverticulitis. There are prostatic calculi present. There is lipomatous infiltration of the ileocecal valve.  CT Head (10/31): Changes consistent with a focal neoplasm within the right posterior parietal region with significant surrounding white matter edema and  midline shift. MRI with contrast material is recommended for further evaluation.  MRI Brain (10/31): Two adjacent enhancing mass lesions in the right parietal lobe with central necrosis and extensive surrounding edema and mass effect. Hyperintensity extends into the splenium of the corpus  callosum. 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 Metastatic disease is a less likely consideration. 7 mm midline shift to the left.  Subcentimeter focus of restricted diffusion in the left frontal lobe likely an area of acute or subacute infarction.  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   Jake Paganini, MD 07/06/2014, 9:29 AM PGY-1, [place] Intern pager: [phone], text pages welcome

## 2014-07-06 NOTE — Progress Notes (Signed)
Patient ID: Jake [name], male   DOB: 1955-11-22, 58 y.o.   MRN: [id number] Patient is doing well on steroids significant improved movement of the left side of his body. We have extensively gone over the risks and benefits of a right-sided occipital craniotomy forresection of a ring-enhancing mass in his right occipital lobe. An extensor gone over with his wife and he the risks and benefits of surgery perioperative course expectations of outcome and alternatives of surgery and they understand and agree to proceed forward. We says set that up for tomorrow morning.

## 2014-07-07 ENCOUNTER — Inpatient Hospital Stay (HOSPITAL_COMMUNITY): Payer: Commercial Managed Care - PPO | Admitting: Certified Registered Nurse Anesthetist

## 2014-07-07 ENCOUNTER — Encounter (HOSPITAL_COMMUNITY)
Admission: EM | Disposition: A | Discharge status: Home / Self Care | Payer: Self-pay | Source: Home / Self Care | Attending: Family Medicine

## 2014-07-07 ENCOUNTER — Encounter (HOSPITAL_COMMUNITY): Payer: Self-pay | Admitting: Certified Registered Nurse Anesthetist

## 2014-07-07 DIAGNOSIS — C719 Malignant neoplasm of brain, unspecified: Secondary | ICD-10-CM | POA: Diagnosis present

## 2014-07-07 HISTORY — PX: CRANIOTOMY: SHX93

## 2014-07-07 LAB — GLUCOSE, CAPILLARY
GLUCOSE-CAPILLARY: 102 mg/dL — AB (ref 70–99)
Glucose-Capillary: 99 mg/dL (ref 70–99)
Glucose-Capillary: 89 mg/dL (ref 70–99)

## 2014-07-07 LAB — SURGICAL PCR SCREEN
MRSA, PCR: NEGATIVE
Staphylococcus aureus: NEGATIVE

## 2014-07-07 SURGERY — CRANIOTOMY TUMOR EXCISION
Anesthesia: General

## 2014-07-07 MED ORDER — SODIUM CHLORIDE 0.9 % IV SOLN
INTRAVENOUS | Status: DC | PRN
  Administered 2014-07-07 (×2): via INTRAVENOUS

## 2014-07-07 MED ORDER — DOCUSATE SODIUM 100 MG PO CAPS
100.0000 mg | ORAL_CAPSULE | Freq: Two times a day (BID) | ORAL | Status: DC
  Administered 2014-07-07 – 2014-07-09 (×5): 100 mg via ORAL
  Filled 2014-07-07 (×7): qty 1

## 2014-07-07 MED ORDER — NEOSTIGMINE METHYLSULFATE 10 MG/10ML IV SOLN
INTRAVENOUS | Status: AC
Start: 2014-07-07 — End: 2014-07-07
  Filled 2014-07-07: qty 2

## 2014-07-07 MED ORDER — LIDOCAINE HCL (CARDIAC) 20 MG/ML IV SOLN
INTRAVENOUS | Status: DC | PRN
  Administered 2014-07-07: 80 mg via INTRAVENOUS
  Administered 2014-07-07: 25 mg via INTRAVENOUS

## 2014-07-07 MED ORDER — BACITRACIN ZINC 500 UNIT/GM EX OINT
TOPICAL_OINTMENT | CUTANEOUS | Status: DC | PRN
  Administered 2014-07-07: 1 via TOPICAL

## 2014-07-07 MED ORDER — SODIUM CHLORIDE 0.9 % IJ SOLN
INTRAMUSCULAR | Status: AC
  Filled 2014-07-07: qty 10

## 2014-07-07 MED ORDER — ROCURONIUM BROMIDE 50 MG/5ML IV SOLN
INTRAVENOUS | Status: AC
  Filled 2014-07-07: qty 1

## 2014-07-07 MED ORDER — ONDANSETRON HCL 4 MG/2ML IJ SOLN
INTRAMUSCULAR | Status: AC
  Filled 2014-07-07: qty 4

## 2014-07-07 MED ORDER — FENTANYL CITRATE 0.05 MG/ML IJ SOLN
INTRAMUSCULAR | Status: AC
  Filled 2014-07-07: qty 5

## 2014-07-07 MED ORDER — HYDROMORPHONE HCL 1 MG/ML IJ SOLN
0.5000 mg | INTRAMUSCULAR | Status: DC | PRN
  Administered 2014-07-07 (×2): 1 mg via INTRAVENOUS
  Administered 2014-07-08: 0.5 mg via INTRAVENOUS
  Administered 2014-07-08 – 2014-07-09 (×4): 1 mg via INTRAVENOUS
  Filled 2014-07-07 (×7): qty 1

## 2014-07-07 MED ORDER — CEFAZOLIN SODIUM-DEXTROSE 2-3 GM-% IV SOLR
INTRAVENOUS | Status: DC | PRN
  Administered 2014-07-07: 3 g via INTRAVENOUS
  Administered 2014-07-07: 2 g via INTRAVENOUS

## 2014-07-07 MED ORDER — ROCURONIUM BROMIDE 50 MG/5ML IV SOLN
INTRAVENOUS | Status: AC
  Filled 2014-07-07: qty 2

## 2014-07-07 MED ORDER — EPHEDRINE SULFATE 50 MG/ML IJ SOLN
INTRAMUSCULAR | Status: AC
  Filled 2014-07-07: qty 1

## 2014-07-07 MED ORDER — SODIUM CHLORIDE 0.9 % IV SOLN
INTRAVENOUS | Status: DC | PRN
  Administered 2014-07-07: 07:00:00 via INTRAVENOUS

## 2014-07-07 MED ORDER — SUCCINYLCHOLINE CHLORIDE 20 MG/ML IJ SOLN
INTRAMUSCULAR | Status: DC | PRN
  Administered 2014-07-07: 100 mg via INTRAVENOUS

## 2014-07-07 MED ORDER — MANNITOL 25 % IV SOLN
INTRAVENOUS | Status: DC | PRN
  Administered 2014-07-07 (×2): 12.5 g via INTRAVENOUS

## 2014-07-07 MED ORDER — CEFAZOLIN SODIUM-DEXTROSE 2-3 GM-% IV SOLR
INTRAVENOUS | Status: AC
  Filled 2014-07-07: qty 50

## 2014-07-07 MED ORDER — ESMOLOL HCL 10 MG/ML IV SOLN
INTRAVENOUS | Status: AC
  Filled 2014-07-07: qty 10

## 2014-07-07 MED ORDER — LEVETIRACETAM IN NACL 500 MG/100ML IV SOLN
500.0000 mg | Freq: Two times a day (BID) | INTRAVENOUS | Status: DC
  Administered 2014-07-07 – 2014-07-08 (×3): 500 mg via INTRAVENOUS
  Filled 2014-07-07 (×4): qty 100

## 2014-07-07 MED ORDER — BACITRACIN 50000 UNITS IM SOLR
INTRAMUSCULAR | Status: DC | PRN
  Administered 2014-07-07: 500 mL

## 2014-07-07 MED ORDER — PHENYLEPHRINE HCL 10 MG/ML IJ SOLN
INTRAMUSCULAR | Status: DC | PRN
  Administered 2014-07-07 (×2): 80 ug via INTRAVENOUS

## 2014-07-07 MED ORDER — CEFAZOLIN SODIUM-DEXTROSE 2-3 GM-% IV SOLR
2.0000 g | Freq: Three times a day (TID) | INTRAVENOUS | Status: AC
  Administered 2014-07-07 (×2): 2 g via INTRAVENOUS
  Filled 2014-07-07 (×2): qty 50

## 2014-07-07 MED ORDER — PROPOFOL 10 MG/ML IV BOLUS
INTRAVENOUS | Status: AC
  Filled 2014-07-07: qty 20

## 2014-07-07 MED ORDER — LIDOCAINE HCL (CARDIAC) 20 MG/ML IV SOLN
INTRAVENOUS | Status: AC
  Filled 2014-07-07: qty 10

## 2014-07-07 MED ORDER — MANNITOL 25 % IV SOLN
INTRAVENOUS | Status: AC
  Filled 2014-07-07: qty 50

## 2014-07-07 MED ORDER — NEOSTIGMINE METHYLSULFATE 10 MG/10ML IV SOLN
INTRAVENOUS | Status: DC | PRN
  Administered 2014-07-07: 5 mg via INTRAVENOUS

## 2014-07-07 MED ORDER — LABETALOL HCL 5 MG/ML IV SOLN: 10.0000 mg | INTRAVENOUS | Status: DC | PRN

## 2014-07-07 MED ORDER — MIDAZOLAM HCL 2 MG/2ML IJ SOLN
INTRAMUSCULAR | Status: AC
Start: 2014-07-07 — End: 2014-07-07
  Filled 2014-07-07: qty 2

## 2014-07-07 MED ORDER — ONDANSETRON HCL 4 MG/2ML IJ SOLN
INTRAMUSCULAR | Status: DC | PRN
  Administered 2014-07-07 (×2): 4 mg via INTRAVENOUS

## 2014-07-07 MED ORDER — GLYCOPYRROLATE 0.2 MG/ML IJ SOLN
INTRAMUSCULAR | Status: DC | PRN
  Administered 2014-07-07: .1 mg via INTRAVENOUS
  Administered 2014-07-07: .8 mg via INTRAVENOUS

## 2014-07-07 MED ORDER — ARTIFICIAL TEARS OP OINT
TOPICAL_OINTMENT | OPHTHALMIC | Status: DC | PRN
  Administered 2014-07-07: 1 via OPHTHALMIC

## 2014-07-07 MED ORDER — PANTOPRAZOLE SODIUM 40 MG IV SOLR
40.0000 mg | Freq: Every day | INTRAVENOUS | Status: DC
  Administered 2014-07-07 – 2014-07-08 (×2): 40 mg via INTRAVENOUS
  Filled 2014-07-07 (×3): qty 40

## 2014-07-07 MED ORDER — ARTIFICIAL TEARS OP OINT
TOPICAL_OINTMENT | OPHTHALMIC | Status: AC
  Filled 2014-07-07: qty 7

## 2014-07-07 MED ORDER — PHENYLEPHRINE 40 MCG/ML (10ML) SYRINGE FOR IV PUSH (FOR BLOOD PRESSURE SUPPORT)
PREFILLED_SYRINGE | INTRAVENOUS | Status: AC
  Filled 2014-07-07: qty 10

## 2014-07-07 MED ORDER — SUCCINYLCHOLINE CHLORIDE 20 MG/ML IJ SOLN
INTRAMUSCULAR | Status: AC
  Filled 2014-07-07: qty 1

## 2014-07-07 MED ORDER — FUROSEMIDE 10 MG/ML IJ SOLN
INTRAMUSCULAR | Status: DC | PRN
  Administered 2014-07-07: 10 mg via INTRAMUSCULAR

## 2014-07-07 MED ORDER — PROPOFOL 10 MG/ML IV BOLUS
INTRAVENOUS | Status: DC | PRN
  Administered 2014-07-07: 30 mg via INTRAVENOUS
  Administered 2014-07-07 (×2): 50 mg via INTRAVENOUS
  Administered 2014-07-07: 270 mg via INTRAVENOUS

## 2014-07-07 MED ORDER — DEXAMETHASONE SODIUM PHOSPHATE 4 MG/ML IJ SOLN
4.0000 mg | Freq: Four times a day (QID) | INTRAMUSCULAR | Status: AC
  Administered 2014-07-08 – 2014-07-09 (×4): 4 mg via INTRAVENOUS
  Filled 2014-07-07 (×6): qty 1

## 2014-07-07 MED ORDER — ONDANSETRON HCL 4 MG/2ML IJ SOLN: 4.0000 mg | INTRAMUSCULAR | Status: DC | PRN

## 2014-07-07 MED ORDER — PROMETHAZINE HCL 25 MG PO TABS: 12.5000 mg | ORAL_TABLET | ORAL | Status: DC | PRN

## 2014-07-07 MED ORDER — DEXAMETHASONE SODIUM PHOSPHATE 10 MG/ML IJ SOLN
INTRAMUSCULAR | Status: DC | PRN
  Administered 2014-07-07: 10 mg via INTRAVENOUS

## 2014-07-07 MED ORDER — POTASSIUM CHLORIDE IN NACL 20-0.9 MEQ/L-% IV SOLN
INTRAVENOUS | Status: DC
  Administered 2014-07-07 – 2014-07-08 (×2): via INTRAVENOUS
  Filled 2014-07-07 (×4): qty 1000

## 2014-07-07 MED ORDER — DEXAMETHASONE SODIUM PHOSPHATE 10 MG/ML IJ SOLN
10.0000 mg | Freq: Four times a day (QID) | INTRAMUSCULAR | Status: AC
  Administered 2014-07-07: 10 mg via INTRAVENOUS
  Filled 2014-07-07: qty 1

## 2014-07-07 MED ORDER — PHENYLEPHRINE HCL 10 MG/ML IJ SOLN
10.0000 mg | INTRAMUSCULAR | Status: DC | PRN
  Administered 2014-07-07: 5 ug/min via INTRAVENOUS

## 2014-07-07 MED ORDER — HYDROCODONE-ACETAMINOPHEN 5-325 MG PO TABS
1.0000 | ORAL_TABLET | ORAL | Status: DC | PRN
  Administered 2014-07-07 – 2014-07-09 (×7): 1 via ORAL
  Filled 2014-07-07 (×7): qty 1

## 2014-07-07 MED ORDER — DEXAMETHASONE SODIUM PHOSPHATE 4 MG/ML IJ SOLN
4.0000 mg | Freq: Three times a day (TID) | INTRAMUSCULAR | Status: DC
  Filled 2014-07-07 (×2): qty 1

## 2014-07-07 MED ORDER — THROMBIN 20000 UNITS EX SOLR
CUTANEOUS | Status: DC | PRN
  Administered 2014-07-07: 09:00:00 via TOPICAL

## 2014-07-07 MED ORDER — SODIUM CHLORIDE 0.9 % IR SOLN
Status: DC | PRN
  Administered 2014-07-07: 1000 mL

## 2014-07-07 MED ORDER — LIDOCAINE-EPINEPHRINE 1 %-1:100000 IJ SOLN
INTRAMUSCULAR | Status: DC | PRN
  Administered 2014-07-07: 10 mL

## 2014-07-07 MED ORDER — MANNITOL 25 % IV SOLN
INTRAVENOUS | Status: AC
  Filled 2014-07-07: qty 100

## 2014-07-07 MED ORDER — ONDANSETRON HCL 4 MG PO TABS: 4.0000 mg | ORAL_TABLET | ORAL | Status: DC | PRN

## 2014-07-07 MED ORDER — ROCURONIUM BROMIDE 100 MG/10ML IV SOLN
INTRAVENOUS | Status: DC | PRN
  Administered 2014-07-07: 10 mg via INTRAVENOUS
  Administered 2014-07-07: 30 mg via INTRAVENOUS
  Administered 2014-07-07 (×2): 5 mg via INTRAVENOUS
  Administered 2014-07-07: 20 mg via INTRAVENOUS
  Administered 2014-07-07: 30 mg via INTRAVENOUS
  Administered 2014-07-07: 20 mg via INTRAVENOUS

## 2014-07-07 MED ORDER — FENTANYL CITRATE 0.05 MG/ML IJ SOLN
INTRAMUSCULAR | Status: DC | PRN
  Administered 2014-07-07: 150 ug via INTRAVENOUS
  Administered 2014-07-07 (×3): 50 ug via INTRAVENOUS
  Administered 2014-07-07 (×3): 25 ug via INTRAVENOUS
  Administered 2014-07-07: 50 ug via INTRAVENOUS
  Administered 2014-07-07 (×2): 25 ug via INTRAVENOUS

## 2014-07-07 SURGICAL SUPPLY — 93 items
BAG DECANTER FOR FLEXI CONT (MISCELLANEOUS) ×3 IMPLANT
BANDAGE GAUZE 4  KLING STR (GAUZE/BANDAGES/DRESSINGS) ×3 IMPLANT
BLADE CLIPPER SURG (BLADE) ×3 IMPLANT
BLADE SURG 11 STRL SS (BLADE) ×3 IMPLANT
BNDG COHESIVE 4X5 TAN NS LF (GAUZE/BANDAGES/DRESSINGS) ×3 IMPLANT
BRUSH SCRUB EZ 1% IODOPHOR (MISCELLANEOUS) IMPLANT
BRUSH SCRUB EZ PLAIN DRY (MISCELLANEOUS) ×3 IMPLANT
BUR ACORN 9.0 PRECISION (BURR) ×2 IMPLANT
BUR ACORN 9.0MM PRECISION (BURR) ×1
BUR ROUTER D-58 CRANI (BURR) IMPLANT
CANISTER SUCT 3000ML (MISCELLANEOUS) ×6 IMPLANT
CLIP TI MEDIUM 6 (CLIP) ×3 IMPLANT
CONT SPEC 4OZ CLIKSEAL STRL BL (MISCELLANEOUS) ×6 IMPLANT
COVER BACK TABLE 60X90IN (DRAPES) ×3 IMPLANT
DECANTER SPIKE VIAL GLASS SM (MISCELLANEOUS) ×3 IMPLANT
DRAPE CAMERA VIDEO/LASER (DRAPES) IMPLANT
DRAPE LONG LASER MIC (DRAPES) IMPLANT
DRAPE MICROSCOPE LEICA (MISCELLANEOUS) IMPLANT
DRAPE STERI IOBAN 125X83 (DRAPES) IMPLANT
DRAPE WARM FLUID 44X44 (DRAPE) ×3 IMPLANT
DRSG OPSITE 4X5.5 SM (GAUZE/BANDAGES/DRESSINGS) ×6 IMPLANT
ELECT CAUTERY BLADE 6.4 (BLADE) ×3 IMPLANT
ELECT REM PT RETURN 9FT ADLT (ELECTROSURGICAL) ×3
ELECTRODE REM PT RTRN 9FT ADLT (ELECTROSURGICAL) ×1 IMPLANT
FORCEPS BIPOLAR SPETZLER 8 1.0 (NEUROSURGERY SUPPLIES) ×3 IMPLANT
GAUZE SPONGE 4X4 12PLY STRL (GAUZE/BANDAGES/DRESSINGS) ×3 IMPLANT
GAUZE SPONGE 4X4 16PLY XRAY LF (GAUZE/BANDAGES/DRESSINGS) IMPLANT
GLOVE BIO SURGEON STRL SZ 6.5 (GLOVE) IMPLANT
GLOVE BIO SURGEON STRL SZ7 (GLOVE) IMPLANT
GLOVE BIO SURGEON STRL SZ7.5 (GLOVE) IMPLANT
GLOVE BIO SURGEON STRL SZ8 (GLOVE) ×9 IMPLANT
GLOVE BIO SURGEON STRL SZ8.5 (GLOVE) IMPLANT
GLOVE BIO SURGEONS STRL SZ 6.5 (GLOVE)
GLOVE BIOGEL M 8.0 STRL (GLOVE) IMPLANT
GLOVE BIOGEL PI IND STRL 7.0 (GLOVE) ×1 IMPLANT
GLOVE BIOGEL PI INDICATOR 7.0 (GLOVE) ×2
GLOVE ECLIPSE 6.5 STRL STRAW (GLOVE) ×3 IMPLANT
GLOVE ECLIPSE 7.0 STRL STRAW (GLOVE) IMPLANT
GLOVE ECLIPSE 7.5 STRL STRAW (GLOVE) IMPLANT
GLOVE ECLIPSE 8.0 STRL XLNG CF (GLOVE) ×3 IMPLANT
GLOVE ECLIPSE 8.5 STRL (GLOVE) IMPLANT
GLOVE EXAM NITRILE LRG STRL (GLOVE) IMPLANT
GLOVE EXAM NITRILE MD LF STRL (GLOVE) IMPLANT
GLOVE EXAM NITRILE XL STR (GLOVE) IMPLANT
GLOVE EXAM NITRILE XS STR PU (GLOVE) IMPLANT
GLOVE INDICATOR 6.5 STRL GRN (GLOVE) IMPLANT
GLOVE INDICATOR 7.0 STRL GRN (GLOVE) IMPLANT
GLOVE INDICATOR 7.5 STRL GRN (GLOVE) IMPLANT
GLOVE INDICATOR 8.0 STRL GRN (GLOVE) IMPLANT
GLOVE INDICATOR 8.5 STRL (GLOVE) ×3 IMPLANT
GLOVE OPTIFIT SS 8.0 STRL (GLOVE) IMPLANT
GLOVE SS BIOGEL STRL SZ 6.5 (GLOVE) ×3 IMPLANT
GLOVE SUPERSENSE BIOGEL SZ 6.5 (GLOVE) ×6
GLOVE SURG SS PI 6.5 STRL IVOR (GLOVE) IMPLANT
GOWN STRL REUS W/ TWL LRG LVL3 (GOWN DISPOSABLE) ×1 IMPLANT
GOWN STRL REUS W/ TWL XL LVL3 (GOWN DISPOSABLE) ×2 IMPLANT
GOWN STRL REUS W/TWL 2XL LVL3 (GOWN DISPOSABLE) IMPLANT
GOWN STRL REUS W/TWL LRG LVL3 (GOWN DISPOSABLE) ×2
GOWN STRL REUS W/TWL XL LVL3 (GOWN DISPOSABLE) ×4
HEMOSTAT SURGICEL 2X14 (HEMOSTASIS) IMPLANT
KIT BASIN OR (CUSTOM PROCEDURE TRAY) ×3 IMPLANT
KIT ROOM TURNOVER OR (KITS) ×3 IMPLANT
LIQUID BAND (GAUZE/BANDAGES/DRESSINGS) IMPLANT
MARKER SPHERE PSV REFLC THRD 3 (MARKER) ×6 IMPLANT
NEEDLE HYPO 25X1 1.5 SAFETY (NEEDLE) ×3 IMPLANT
NS IRRIG 1000ML POUR BTL (IV SOLUTION) ×6 IMPLANT
PACK CRANIOTOMY (CUSTOM PROCEDURE TRAY) ×3 IMPLANT
PAD ARMBOARD 7.5X6 YLW CONV (MISCELLANEOUS) ×9 IMPLANT
PAD EYE OVAL STERILE LF (GAUZE/BANDAGES/DRESSINGS) IMPLANT
PATTIES SURGICAL .25X.25 (GAUZE/BANDAGES/DRESSINGS) IMPLANT
PATTIES SURGICAL .5 X.5 (GAUZE/BANDAGES/DRESSINGS) IMPLANT
PATTIES SURGICAL .5 X3 (DISPOSABLE) ×21 IMPLANT
PATTIES SURGICAL 1X1 (DISPOSABLE) IMPLANT
PLATE 1.5/0.5 18.5MM BURR HOLE (Plate) ×9 IMPLANT
RUBBERBAND STERILE (MISCELLANEOUS) IMPLANT
SCREW SELF DRILL HT 1.5/4MM (Screw) ×36 IMPLANT
SPONGE NEURO XRAY DETECT 1X3 (DISPOSABLE) ×3 IMPLANT
SPONGE SURGIFOAM ABS GEL 100 (HEMOSTASIS) ×6 IMPLANT
SPONGE SURGIFOAM ABS GEL 12-7 (HEMOSTASIS) IMPLANT
STAPLER VISISTAT 35W (STAPLE) ×3 IMPLANT
SUT ETHILON 3 0 FSL (SUTURE) ×6 IMPLANT
SUT NURALON 4 0 TR CR/8 (SUTURE) ×6 IMPLANT
SUT VIC AB 2-0 CT1 18 (SUTURE) ×9 IMPLANT
SYR CONTROL 10ML LL (SYRINGE) ×3 IMPLANT
TIP SONASTAR PREC MISONIX 1.1 (TRAY / TRAY PROCEDURE) IMPLANT
TOWEL OR 17X24 6PK STRL BLUE (TOWEL DISPOSABLE) ×3 IMPLANT
TOWEL OR 17X26 10 PK STRL BLUE (TOWEL DISPOSABLE) ×3 IMPLANT
TRAY FOLEY CATH 14FRSI W/METER (CATHETERS) IMPLANT
TRAY FOLEY CATH 16FRSI W/METER (SET/KITS/TRAYS/PACK) ×3 IMPLANT
TUBE CONNECTING 12'X1/4 (SUCTIONS) ×1
TUBE CONNECTING 12X1/4 (SUCTIONS) ×2 IMPLANT
UNDERPAD 30X30 INCONTINENT (UNDERPADS AND DIAPERS) ×3 IMPLANT
WATER STERILE IRR 1000ML POUR (IV SOLUTION) ×3 IMPLANT

## 2014-07-07 NOTE — Progress Notes (Signed)
Patient neurologically stable while awake alert prepped for the OR. I have extensively reviewed the risks and benefits of the operation with the patient as well as perioperative course expectations of outcome and alternatives surgery and he understands and agrees to proceed forward.

## 2014-07-07 NOTE — Addendum Note (Signed)
Addendum  created 07/07/14 1530 by Suzy Bouchard, CRNA   Modules edited: Anesthesia Responsible Staff

## 2014-07-07 NOTE — Progress Notes (Signed)
FPTS Social Note  Visited patient after surgery today.  He is feeling well with no complaints.  Appears stable without pressors.  Has pain regimen ordered per Neurosurgery.  Will follow along peripherally (Neurosurgery primary) while in ICU.  Will happily resume care for this patient as primary team when deemed appropriate for SDU or floor status.   [name], MD, MPH PGY-1,  [facility] 07/07/2014 2:03 PM

## 2014-07-07 NOTE — Progress Notes (Signed)
Pt taken to OR at 0655 accompanied by wife,was however reassured. Obasogie-Asidi, Abhishek Levesque Efe

## 2014-07-07 NOTE — Anesthesia Postprocedure Evaluation (Signed)
  Anesthesia Post-op Note  Patient: Jake [name]  Procedure(s) Performed: Procedure(s): CRANIOTOMY TUMOR EXCISION/CURVE (N/A)  Patient Location: PACU  Anesthesia Type:General  Level of Consciousness: awake, alert , oriented and patient cooperative  Airway and Oxygen Therapy: Patient Spontanous Breathing  Post-op Pain: mild  Post-op Assessment: Post-op Vital signs reviewed, Patient's Cardiovascular Status Stable, Respiratory Function Stable, Patent Airway, No signs of Nausea or vomiting and Pain level controlled  Post-op Vital Signs: stable  Last Vitals:  Filed Vitals:   07/07/14 1300  BP: 142/82  Pulse: 73  Temp:   Resp: 12    Complications: No apparent anesthesia complications

## 2014-07-07 NOTE — Anesthesia Procedure Notes (Signed)
Procedure Name: Intubation Date/Time: 07/07/2014 8:42 AM Performed by: Garner Nash Pre-anesthesia Checklist: Patient identified, Timeout performed, Emergency Drugs available, Suction available and Patient being monitored Patient Re-evaluated:Patient Re-evaluated prior to inductionOxygen Delivery Method: Circle system utilized Preoxygenation: Pre-oxygenation with 100% oxygen Intubation Type: IV induction Ventilation: Oral airway inserted - appropriate to patient size and Mask ventilation without difficulty Laryngoscope Size: Mac and 4 Grade View: Grade I Tube type: Oral Tube size: 7.5 mm Number of attempts: 1 Airway Equipment and Method: LTA kit utilized Placement Confirmation: ETT inserted through vocal cords under direct vision,  positive ETCO2,  CO2 detector and breath sounds checked- equal and bilateral Secured at: 21 cm Tube secured with: Tape Dental Injury: Teeth and Oropharynx as per pre-operative assessment

## 2014-07-07 NOTE — Op Note (Signed)
Preoperative diagnosis: Right parietal occipital mass with additional right occipital satellite lesion  Postoperative diagnosis: Right parietal occipital high-grade glioma  Procedure: Right occipital stereotactic craniotomy for resection of 2 brain masses  Surgeon: Dominica Severin Alyissa Whidbee  Asst.: Lala Lund Kritzer  Anesthesia: Gen.  EBL: Minimal  Frozen pathology: glioblastoma multiforme  History of present illness: Patient is a very pleasant 58 year old gentleman who is presented the emergency department with two-week history of headaches balance difficulty weakness in the low left side and visual field deficit. Workup revealed 2 ring-enhancing lesions in the parietaloccipital lobe on the right with mass effect. Metastatic workup was negative so patient was recommended stereotactic craniotomy for excision of both masses I extensively reviewed the risks and benefits of the operation with the patient as well as perioperative course expectations of outcome and alternatives of surgery and he understands and agrees to proceed forward.  Operative procedure: Patient was induced under general anesthesia positioned prone in pins and neck turned slightly to the right exposing the right parietal occipital lobe using the BrainLab stereotactic navigation system the head was registered the tumors were identified on the outside of scalp the head was then shaved prepped and draped in routine sterile fashion a linear incision was made from the top of the skull down to superior aspect of the neck after the galea was dissected off and subperiosteal dissection allowed and verification of the skull of the right occipital lobe the navigation system again helped outline the margins of the craniotomy flaps of both lesions would be in the single craniotomy. So with 3 bur holes a cranial flap was turned then again I rechecked position with BrainLab to confirm based on dural position dural opening and I flapped the dura against the lateral  aspect of the craniotomy defect.the brain was markedly tense despite preoperative mannitol and interoperative hyperventilation we gave some additional Lasix the brain began to relax I immediately made a corticectomy overlying the high parietal Lexapro lesion got down to the margins of the tumor took biopsy specimens that revealed to be a high-grade glioma consistent with a glioblastoma multiforme. Stenosis fluid was immediately aspirated and this help decompress the occipital lobe then I developed worked on developing the plane around the occipital lesion however the plane was somewhat indistinct and highly infiltrative using blunt dissection with a 4 Penfield bipolar cautery and cottonoids sequentially removed the abnormal-appearing tumor tissue developing in a 360 plane the mass using BrainLab periodically to check margins. The margins were a little bit distorted secondary to the cerebral edema but once the brain started to relax I was able to resect what I felt was a gross total resection of the mass. The tumor was highly vascular and once I fell again around the margins of the tumor is significant improvement in bleeding gave incompetence and the fact that I resected the vast majority of tumor. Again final confirmation with BrainLab confirmed all margins to be clear based on the navigation I packed this with Gelfoam and cottonoid and identified a satellite lesion made a separate corticectomy inferiorly and lateral and immediately found the tumor mass. Developed the plane around this lesiontook several additional specimens labeling inferior occipital mass. Felt like I got adequate margins around this lesion as well with just edematous white matter and BrainLab confirmed. After there is no further gross tumor visualized reticular hemostasis was maintained some Surgicel was laid in the tumor beds and the dura was reapproximated with interrupted Nurolon's a piece of Gelfoam and the craniotomy flap was  reapproximated with the Biomet plating system. The linear incision was then closed with interrupted Vicryl and a running nylon. The wound was dressed and the patient sent to recovery room in stable condition.  At the end of case all needle counts sponge counts were correct.

## 2014-07-07 NOTE — Anesthesia Preprocedure Evaluation (Addendum)
Anesthesia Evaluation  Patient identified by MRN, date of birth, ID band Patient awake    Reviewed: Allergy & Precautions, H&P , NPO status , Patient's Chart, lab work & pertinent test results  Airway Mallampati: III  TM Distance: >3 FB Neck ROM: Full    Dental  (+) Edentulous Upper, Edentulous Lower, Dental Advisory Given   Pulmonary sleep apnea , Current Smoker,          Cardiovascular hypertension,     Neuro/Psych    GI/Hepatic   Endo/Other  Hypothyroidism   Renal/GU Renal disease     Musculoskeletal  (+) Arthritis -,   Abdominal   Peds  Hematology   Anesthesia Other Findings Brain Tumor  Reproductive/Obstetrics                            Anesthesia Physical Anesthesia Plan  ASA: III  Anesthesia Plan: General   Post-op Pain Management:    Induction: Intravenous  Airway Management Planned: Oral ETT  Additional Equipment:   Intra-op Plan:   Post-operative Plan: Possible Post-op intubation/ventilation  Informed Consent: I have reviewed the patients History and Physical, chart, labs and discussed the procedure including the risks, benefits and alternatives for the proposed anesthesia with the patient or authorized representative who has indicated his/her understanding and acceptance.     Plan Discussed with: CRNA, Anesthesiologist and Surgeon  Anesthesia Plan Comments:         Anesthesia Quick Evaluation

## 2014-07-07 NOTE — Transfer of Care (Signed)
Immediate Anesthesia Transfer of Care Note  Patient: Jake [name]  Procedure(s) Performed: Procedure(s): CRANIOTOMY TUMOR EXCISION/CURVE (N/A)  Patient Location: PACU  Anesthesia Type:General  Level of Consciousness: awake, alert  and oriented  Airway & Oxygen Therapy: Patient Spontanous Breathing and Patient connected to face mask oxygen  Post-op Assessment: Report given to PACU RN and Post -op Vital signs reviewed and stable  Post vital signs: Reviewed and stable    Pt a/o oriented x3. SV. Moving all extremities.  Complications: No apparent anesthesia complications

## 2014-07-08 ENCOUNTER — Inpatient Hospital Stay (HOSPITAL_COMMUNITY): Payer: Commercial Managed Care - PPO

## 2014-07-08 LAB — BASIC METABOLIC PANEL
Anion gap: 12 (ref 5–15)
BUN: 26 mg/dL — AB (ref 6–23)
CALCIUM: 8.4 mg/dL (ref 8.4–10.5)
CO2: 23 meq/L (ref 19–32)
CREATININE: 0.75 mg/dL (ref 0.50–1.35)
Chloride: 99 mEq/L (ref 96–112)
GFR calc Af Amer: >90 mL/min (ref >90)
GFR calc non Af Amer: >90 mL/min (ref >90)
GLUCOSE: 100 mg/dL — AB (ref 70–99)
Potassium: 4.7 mEq/L (ref 3.7–5.3)
Sodium: 134 mEq/L — ABNORMAL LOW (ref 137–147)

## 2014-07-08 LAB — GLUCOSE, CAPILLARY
GLUCOSE-CAPILLARY: 161 mg/dL — AB (ref 70–99)
Glucose-Capillary: 92 mg/dL (ref 70–99)
Glucose-Capillary: 138 mg/dL — ABNORMAL HIGH (ref 70–99)

## 2014-07-08 MED ORDER — GADOBENATE DIMEGLUMINE 529 MG/ML IV SOLN
20.0000 mL | Freq: Once | INTRAVENOUS | Status: AC | PRN
  Administered 2014-07-08: 20 mL via INTRAVENOUS

## 2014-07-08 NOTE — Progress Notes (Signed)
Report called to [place] on [address]

## 2014-07-08 NOTE — Progress Notes (Signed)
Postop day 1. Overall doing well. Patient denies headache or other problem. Still notes left-sided visual field loss.  Afebrile. Vitals are stable. Patient awake and alert. Oriented and appropriate. 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 Right visual field intact. Cranial nerves otherwise intact. Wound clean and dry. Chest and abdomen benign. Motor and sensory function extremities normal with no evidence of pronator drift.  Overall doing well. Follow-up MRI scan pending. We will mobilize and transfer to floor.

## 2014-07-08 NOTE — Progress Notes (Signed)
UR completed.  Maximina Pirozzi, RN BSN MHA CCM Trauma/Neuro ICU Case Manager [phone]  

## 2014-07-08 NOTE — Progress Notes (Signed)
Pt arrived from 3MW via wheelchair, pt A&Ox4.  Pt oriented to unit/room.

## 2014-07-08 NOTE — Progress Notes (Signed)
FPTS Social Note  Visited patient today.  He is feeling well with no complaints.  Appears stable without pressors.  Has pain regimen ordered per Neurosurgery.  Will follow along peripherally (Neurosurgery primary) while in ICU.  Will happily resume care for this patient as primary team when deemed appropriate for SDU or floor status.   [name], MD, MPH PGY-1,  [facility] 07/08/2014 9:31 AM

## 2014-07-08 NOTE — Evaluation (Addendum)
Physical Therapy Evaluation Patient Details Name: Jake [name] MRN: [id number] DOB: 10-Aug-1956 Today's Date: 07/08/2014   History of Present Illness  Adm 07/02/14 after several week history of Lt weakness, clumsiness, running into things on the right. CT scan showed a right sided parietal occipital mass with extensive vasogenic edema. Underwent craniotomy 07/06/14. PMHx- HTN  Clinical Impression  Patient is s/p above surgery resulting in functional limitations due to the deficits listed below (see PT Problem List). Pt again scored 41/56 on Berg Balance assessment.  Patient will benefit from skilled PT to increase their independence and safety with mobility to allow discharge to the venue listed below. Will have necessary assist from family on d/c.       Follow Up Recommendations Outpatient PT;Supervision for mobility/OOB    Equipment Recommendations  None recommended by PT    Recommendations for Other Services OT consult     Precautions / Restrictions Precautions Precautions: Fall      Mobility  Bed Mobility               General bed mobility comments: not observed; OOB on arrival  Transfers Overall transfer level: Needs assistance Equipment used: None Transfers: Sit to/from Stand Sit to Stand: Supervision         General transfer comment: Less shift over his RLE than prior to surgery; instinctively comes to midline once in full standing  Ambulation/Gait Ambulation/Gait assistance: Min guard Ambulation Distance (Feet): 100 Feet Assistive device: None Gait Pattern/deviations: Step-to pattern;Step-through pattern;Decreased stride length;Decreased weight shift to left;Shuffle Gait velocity: decr   General Gait Details: pt self-selects slower speed and reports due to his decr vision and need to scan to look lt (and doesn't want to trip on anything); even with cues to incr velocity, he does not significantly incr;   Stairs Stairs: Yes Stairs assistance:  Min assist Stair Management: One rail Right;Step to pattern;Forwards Number of Stairs: 3 General stair comments: very cautious; allowed to use rail, however will not have rail at home  Wheelchair Mobility    Modified Rankin (Stroke Patients Only) Modified Rankin (Stroke Patients Only) Pre-Morbid Rankin Score: No symptoms Modified Rankin: Moderately severe disability     Balance     Sitting balance-Leahy Scale: Normal       Standing balance-Leahy Scale: Good       Tandem Stance - Right Leg:  (unable to achieve full tandem stance)   Rhomberg - Eyes Opened: 45 (then with posterior Left step) Rhomberg - Eyes Closed: 10 (minguard assist for safety)     Standardized Balance Assessment Standardized Balance Assessment : Berg Balance Test Berg Balance Test Sit to Stand: Able to stand without using hands and stabilize independently Standing Unsupported: Able to stand 2 minutes with supervision Sitting with Back Unsupported but Feet Supported on Floor or Stool: Able to sit safely and securely 2 minutes Stand to Sit: Sits safely with minimal use of hands Transfers: Able to transfer safely, minor use of hands Standing Unsupported with Eyes Closed: Able to stand 10 seconds with supervision Standing Ubsupported with Feet Together: Able to place feet together independently but unable to hold for 30 seconds From Standing, Reach Forward with Outstretched Arm: Can reach forward >12 cm safely (5") From Standing Position, Pick up Object from Floor: Able to pick up shoe, needs supervision From Standing Position, Turn to Look Behind Over each Shoulder: Looks behind from both sides and weight shifts well Turn 360 Degrees: Able to turn 360 degrees safely but slowly Standing Unsupported,  Alternately Place Feet on Step/Stool: Able to complete >2 steps/needs minimal assist Standing Unsupported, One Foot in Front: Able to plae foot ahead of the other independently and hold 30 seconds Standing on  One Leg: Tries to lift leg/unable to hold 3 seconds but remains standing independently Total Score: 41         Pertinent Vitals/Pain Pain Assessment: 0-10 Pain Score: 5  Pain Location: head Pain Descriptors / Indicators: Aching Pain Intervention(s): Limited activity within patient's tolerance;Monitored during session;Premedicated before session;Repositioned    Home Living Family/patient expects to be discharged to:: Private residence Living Arrangements: Spouse/significant other Available Help at Discharge: Family;Available PRN/intermittently (works; can flex schedule) Type of Home: House Home Access: Stairs to enter [employer]: None (post at top of steps) Technical brewer of Steps: 5 Home Layout: One level Home Equipment: None      Prior Function Level of Independence: Independent         Comments: Human resources officer Dominance   Dominant Hand: Right    Extremity/Trunk Assessment   Upper Extremity Assessment: Defer to OT evaluation           Lower Extremity Assessment: LLE deficits/detail   LLE Deficits / Details: AROM WFL, hip flexion 5/5, dorsiflexion 4+/5  Cervical / Trunk Assessment: Normal  Communication   Communication: No difficulties  Cognition Arousal/Alertness: Awake/alert Behavior During Therapy: WFL for tasks assessed/performed Overall Cognitive Status: Impaired/Different from baseline Area of Impairment: Attention;Safety/judgement;Awareness           Awareness: Emergent   General Comments: Able to state how he should scan to look to the left, however ran into door on his left when exiting and entering his room. Did avoid other obstacles in the hall and demonstrated better awareness of his left side and visual deficit    General Comments      Exercises        Assessment/Plan    PT Assessment Patient needs continued PT services  PT Diagnosis Hemiplegia non-dominant side   PT Problem List  Decreased balance;Decreased mobility;Decreased safety awareness;Decreased knowledge of precautions (impaired vision)  PT Treatment Interventions Gait training;Stair training;Functional mobility training;Therapeutic activities;Balance training;Neuromuscular re-education;Cognitive remediation;Patient/family education   PT Goals (Current goals can be found in the Care Plan section) Acute Rehab PT Goals Patient Stated Goal: get his strength and balance back PT Goal Formulation: With patient Time For Goal Achievement: 07/12/14 Potential to Achieve Goals: Good Additional Goals Additional Goal #1: Pt will score >48 on Berg (indicative of functional change in score)    Frequency Min 3X/week   Barriers to discharge        Co-evaluation               End of Session Equipment Utilized During Treatment: Gait belt Activity Tolerance: Patient tolerated treatment well Patient left: in chair;with call bell/phone within reach Nurse Communication: Mobility status         Time: 5284-1324 PT Time Calculation (min): 21 min   Charges:   PT Evaluation $Initial PT Evaluation Tier I: 1 Procedure PT Treatments $Gait Training: 8-22 mins   PT G Codes:          Delinda Malan 2014-07-23, 4:07 PM Pager [phone]

## 2014-07-09 LAB — GLUCOSE, CAPILLARY: GLUCOSE-CAPILLARY: 98 mg/dL (ref 70–99)

## 2014-07-09 MED ORDER — DEXAMETHASONE 4 MG PO TABS: 4.0000 mg | ORAL_TABLET | Freq: Three times a day (TID) | ORAL | Status: DC

## 2014-07-09 MED ORDER — INFLUENZA VAC SPLIT QUAD 0.5 ML IM SUSY
0.5000 mL | PREFILLED_SYRINGE | INTRAMUSCULAR | Status: DC
  Filled 2014-07-09: qty 0.5

## 2014-07-09 MED ORDER — HYDROCODONE-ACETAMINOPHEN 5-325 MG PO TABS: 1.0000 | ORAL_TABLET | ORAL | Status: DC | PRN

## 2014-07-09 NOTE — Progress Notes (Signed)
Pt ready for d/c home today, discharge instructions given and reviewed, prescription given, patient was d/c home with family.

## 2014-07-09 NOTE — Progress Notes (Signed)
Upon closing the patients chart we realized that the pt had home medications being held in the pharmacy at [facility].  I called the patient and explained they were here and he said he would try to have someone come up tommorow and retrieve them, patient was unable to give me a time frame.

## 2014-07-09 NOTE — Progress Notes (Signed)
Physical Therapy Treatment Patient Details Name: Jake [name] MRN: [id number] DOB: 09/02/1956 Today's Date: 07/09/2014    History of Present Illness Adm 07/02/14 after several week history of Lt weakness, clumsiness, running into things on the right. CT scan showed a right sided parietal occipital mass with extensive vasogenic edema. Underwent craniotomy 07/06/14. PMHx- HTN    PT Comments    Patient progressing very well this session and is highly motivated to progress. Able to scan environment well this session and had no issues maneuvering around tight spaces. Patient is planning to DC home today but is aware of his limitation and need to take mobility slower at this time.   Follow Up Recommendations  Outpatient PT;Supervision for mobility/OOB     Equipment Recommendations  None recommended by PT    Recommendations for Other Services       Precautions / Restrictions Precautions Precautions: Fall    Mobility  Bed Mobility Overal bed mobility: Modified Independent                Transfers Overall transfer level: Modified independent                  Ambulation/Gait Ambulation/Gait assistance: Min guard Ambulation Distance (Feet): 400 Feet Assistive device: None Gait Pattern/deviations: Step-through pattern;Decreased stride length Gait velocity: decr   General Gait Details: pt self-selects slower speed and reports due to his decr vision and need to scan to look lt. Good job this session of scanning enviroment    Stairs Stairs: Yes Stairs assistance: Min guard Stair Management: One rail Right;Step to pattern Number of Stairs: 3 General stair comments: cautious. Will be holding hand of family member to get into the house  Wheelchair Mobility    Modified Rankin (Stroke Patients Only) Modified Rankin (Stroke Patients Only) Pre-Morbid Rankin Score: No symptoms Modified Rankin: Moderately severe disability     Balance                                    Cognition Arousal/Alertness: Awake/alert Behavior During Therapy: WFL for tasks assessed/performed Overall Cognitive Status: Within Functional Limits for tasks assessed                 General Comments: Patient doing a great job of scanning the enviroment today with and without use of RW    Exercises      General Comments        Pertinent Vitals/Pain Pain Assessment: No/denies pain    Home Living                      Prior Function            PT Goals (current goals can now be found in the care plan section) Progress towards PT goals: Progressing toward goals    Frequency  Min 3X/week    PT Plan Current plan remains appropriate    Co-evaluation             End of Session   Activity Tolerance: Patient tolerated treatment well Patient left: in chair;with call bell/phone within reach     Time: 2355-7322 PT Time Calculation (min): 16 min  Charges:  $Gait Training: 8-22 mins                    G Codes:      Jacqualyn Posey 07/09/2014, 1:33 PM 07/09/2014 Osaze Hubbert, Tonia Brooms  PTA [id number] pager [phone] office

## 2014-07-09 NOTE — Discharge Summary (Signed)
Physician Discharge Summary  Patient ID: Jake [name] MRN: [id number] DOB/AGE: 59/27/1957 59 y.o.  Admit date: 07/02/2014 Discharge date: 07/09/2014  Admission Diagnoses: brain mass    Discharge Diagnoses: same   Discharged Condition: stable  Hospital Course: The patient was admitted on 07/02/2014 and found to have a right acceptable brain lesion. This was worked up with MRI and taken to the operating room where the patient underwent wide excision of a craniotomy for tumor. The patient tolerated the procedure well and was taken to the recovery room and then to the ICU in stable condition. The hospital course was routine. There were no complications. The wound remained clean dry and intact. Pt had appropriate head soreness. No complaints of new N/T/W. He had a left visual field cut. Otherwise he had good strength. The patient remained afebrile with stable vital signs, and tolerated a regular diet. The patient continued to increase activities, and pain was well controlled with oral pain medications.   Consults: None  Significant Diagnostic Studies:  Results for orders placed or performed during the hospital encounter of 07/02/14  MRSA PCR Screening  Result Value Ref Range   MRSA by PCR NEGATIVE NEGATIVE  Surgical pcr screen  Result Value Ref Range   MRSA, PCR NEGATIVE NEGATIVE   Staphylococcus aureus NEGATIVE NEGATIVE  Ethanol  Result Value Ref Range   Alcohol, Ethyl (B) <11 0 - 11 mg/dL  Protime-INR  Result Value Ref Range   Prothrombin Time 13.5 11.6 - 15.2 seconds   INR 1.02 0.00 - 1.49  APTT  Result Value Ref Range   aPTT 34 24 - 37 seconds  CBC  Result Value Ref Range   WBC 6.4 4.0 - 10.5 K/uL   RBC 5.07 4.22 - 5.81 MIL/uL   Hemoglobin 15.2 13.0 - 17.0 g/dL   HCT 44.5 39.0 - 52.0 %   MCV 87.8 78.0 - 100.0 fL   MCH 30.0 26.0 - 34.0 pg   MCHC 34.2 30.0 - 36.0 g/dL   RDW 13.5 11.5 - 15.5 %   Platelets 198 150 - 400 K/uL  Differential  Result Value Ref Range    Neutrophils Relative % 69 43 - 77 %   Neutro Abs 4.4 1.7 - 7.7 K/uL   Lymphocytes Relative 24 12 - 46 %   Lymphs Abs 1.5 0.7 - 4.0 K/uL   Monocytes Relative 7 3 - 12 %   Monocytes Absolute 0.5 0.1 - 1.0 K/uL   Eosinophils Relative 0 0 - 5 %   Eosinophils Absolute 0.0 0.0 - 0.7 K/uL   Basophils Relative 0 0 - 1 %   Basophils Absolute 0.0 0.0 - 0.1 K/uL  Comprehensive metabolic panel  Result Value Ref Range   Sodium 136 (L) 137 - 147 mEq/L   Potassium 4.1 3.7 - 5.3 mEq/L   Chloride 100 96 - 112 mEq/L   CO2 24 19 - 32 mEq/L   Glucose, Bld 96 70 - 99 mg/dL   BUN 12 6 - 23 mg/dL   Creatinine, Ser 0.85 0.50 - 1.35 mg/dL   Calcium 9.3 8.4 - 10.5 mg/dL   Total Protein 6.9 6.0 - 8.3 g/dL   Albumin 3.7 3.5 - 5.2 g/dL   AST 22 0 - 37 U/L   ALT 20 0 - 53 U/L   Alkaline Phosphatase 57 39 - 117 U/L   Total Bilirubin 0.5 0.3 - 1.2 mg/dL   GFR calc non Af Amer >90 >90 mL/min   GFR calc Af Amer >90 >  90 mL/min   Anion gap 12 5 - 15  Urine Drug Screen  Result Value Ref Range   Opiates NONE DETECTED NONE DETECTED   Cocaine NONE DETECTED NONE DETECTED   Benzodiazepines NONE DETECTED NONE DETECTED   Amphetamines NONE DETECTED NONE DETECTED   Tetrahydrocannabinol NONE DETECTED NONE DETECTED   Barbiturates NONE DETECTED NONE DETECTED  Urinalysis, Routine w reflex microscopic  Result Value Ref Range   Color, Urine YELLOW YELLOW   APPearance CLEAR CLEAR   Specific Gravity, Urine 1.011 1.005 - 1.030   pH 6.0 5.0 - 8.0   Glucose, UA NEGATIVE NEGATIVE mg/dL   Hgb urine dipstick TRACE (A) NEGATIVE   Bilirubin Urine NEGATIVE NEGATIVE   Ketones, ur NEGATIVE NEGATIVE mg/dL   Protein, ur NEGATIVE NEGATIVE mg/dL   Urobilinogen, UA 0.2 0.0 - 1.0 mg/dL   Nitrite NEGATIVE NEGATIVE   Leukocytes, UA NEGATIVE NEGATIVE  Urine microscopic-add on  Result Value Ref Range   Squamous Epithelial / LPF RARE RARE   RBC / HPF 0-2 <3 RBC/hpf  Glucose, capillary  Result Value Ref Range   Glucose-Capillary 154  (H) 70 - 99 mg/dL   Comment 1 Notify RN    Comment 2 Documented in Chart   Glucose, capillary  Result Value Ref Range   Glucose-Capillary 134 (H) 70 - 99 mg/dL   Comment 1 Notify RN   Glucose, capillary  Result Value Ref Range   Glucose-Capillary 126 (H) 70 - 99 mg/dL   Comment 1 Notify RN   Glucose, capillary  Result Value Ref Range   Glucose-Capillary 169 (H) 70 - 99 mg/dL   Comment 1 Notify RN   Glucose, capillary  Result Value Ref Range   Glucose-Capillary 114 (H) 70 - 99 mg/dL  Glucose, capillary  Result Value Ref Range   Glucose-Capillary 121 (H) 70 - 99 mg/dL   Comment 1 Documented in Chart   Protime-INR  Result Value Ref Range   Prothrombin Time 13.2 11.6 - 15.2 seconds   INR 0.99 0.00 - 1.49  Glucose, capillary  Result Value Ref Range   Glucose-Capillary 116 (H) 70 - 99 mg/dL   Comment 1 Notify RN    Comment 2 Documented in Chart   Glucose, capillary  Result Value Ref Range   Glucose-Capillary 156 (H) 70 - 99 mg/dL  Glucose, capillary  Result Value Ref Range   Glucose-Capillary 102 (H) 70 - 99 mg/dL   Comment 1 Notify RN   Basic metabolic panel  Result Value Ref Range   Sodium 134 (L) 137 - 147 mEq/L   Potassium 4.7 3.7 - 5.3 mEq/L   Chloride 99 96 - 112 mEq/L   CO2 23 19 - 32 mEq/L   Glucose, Bld 100 (H) 70 - 99 mg/dL   BUN 26 (H) 6 - 23 mg/dL   Creatinine, Ser 0.75 0.50 - 1.35 mg/dL   Calcium 8.4 8.4 - 10.5 mg/dL   GFR calc non Af Amer >90 >90 mL/min   GFR calc Af Amer >90 >90 mL/min   Anion gap 12 5 - 15  Glucose, capillary  Result Value Ref Range   Glucose-Capillary 99 70 - 99 mg/dL   Comment 1 Notify RN    Comment 2 Documented in Chart   Glucose, capillary  Result Value Ref Range   Glucose-Capillary 89 70 - 99 mg/dL  Glucose, capillary  Result Value Ref Range   Glucose-Capillary 92 70 - 99 mg/dL  Glucose, capillary  Result Value Ref Range  Glucose-Capillary 161 (H) 70 - 99 mg/dL   Comment 1 Documented in Chart    Comment 2 Notify RN    Glucose, capillary  Result Value Ref Range   Glucose-Capillary 138 (H) 70 - 99 mg/dL   Comment 1 Documented in Chart    Comment 2 Notify RN   Glucose, capillary  Result Value Ref Range   Glucose-Capillary 98 70 - 99 mg/dL   Comment 1 Documented in Chart    Comment 2 Notify RN   I-Stat Chem 8, ED  Result Value Ref Range   Sodium 136 (L) 137 - 147 mEq/L   Potassium 4.0 3.7 - 5.3 mEq/L   Chloride 102 96 - 112 mEq/L   BUN 12 6 - 23 mg/dL   Creatinine, Ser 0.80 0.50 - 1.35 mg/dL   Glucose, Bld 102 (H) 70 - 99 mg/dL   Calcium, Ion 1.21 1.12 - 1.23 mmol/L   TCO2 23 0 - 100 mmol/L   Hemoglobin 16.7 13.0 - 17.0 g/dL   HCT 49.0 39.0 - 52.0 %  I-stat troponin, ED (not at [facility])  Result Value Ref Range   Troponin i, poc 0.00 0.00 - 0.08 ng/mL   Comment 3          Type and screen  Result Value Ref Range   ABO/RH(D) O NEG    Antibody Screen NEG    Sample Expiration 07/09/2014   ABO/Rh  Result Value Ref Range   ABO/RH(D) O NEG     Ct Head Wo Contrast  07/02/2014   CLINICAL DATA:  Headaches for 1 week, with cord may shin issues on the left side  EXAM: CT HEAD WITHOUT CONTRAST  TECHNIQUE: Contiguous axial images were obtained from the base of the skull through the vertex without intravenous contrast.  COMPARISON:  None.  FINDINGS: The bony calvarium is intact. Paranasal sinuses show some mild mucosal thickening within the ethmoid air cells.  There is a somewhat ovoid area decreased attenuation identified in the right posterior parietal region near the vertex which measures at least 4.1 x 3.8 cm in dimension. It is surrounded by significant white matter edema and consistent with an underlying neoplasm. There is significant distortion of the right lateral ventricle particularly the occipital horn and there is evidence of midline shift of approximately 9-10 mm. No findings to suggest acute hemorrhage are seen. No other focal lesions are noted.  IMPRESSION: Changes consistent with a focal neoplasm  within the right posterior parietal region with significant surrounding white matter edema and midline shift. MRI with contrast material is recommended for further evaluation.  Critical Value/emergent results were called by telephone at the time of interpretation on 07/02/2014 at 4:39 pm to Dr. Carlisle Cater , who verbally acknowledged these results.   Electronically Signed   By: Inez Catalina M.D.   On: 07/02/2014 16:42   Ct Chest W Contrast  07/02/2014   CLINICAL DATA:  Patient will brain tumor and left-sided weakness  EXAM: CT CHEST, ABDOMEN, AND PELVIS WITH CONTRAST  TECHNIQUE: Multidetector CT imaging of the chest, abdomen and pelvis was performed following the standard protocol during bolus administration of intravenous contrast. Oral contrast was administered for this CT abdomen and pelvis studies.  CONTRAST:  1102mL OMNIPAQUE IOHEXOL 300 MG/ML  SOLN  COMPARISON:  None.  FINDINGS: CT CHEST FINDINGS  There is minimal scarring in the inferior lingula. There is no lung edema or consolidation. On axial slice 18 series 4, there is a 2 mm nodular opacity in  the posterior segment of the right upper lobe. The lungs are otherwise clear. There is no appreciable thoracic adenopathy. There are a few small mediastinal lymph nodes which do not meet size criteria for pathologic significance. There are small calcified sub- carinal and left hilar lymph nodes consistent with prior granulomatous disease.  There are multiple foci of coronary artery calcification. Pericardium is not thickened.  There are no blastic or lytic bone lesions in the thoracic region. Visualized thyroid appears normal.  CT ABDOMEN AND PELVIS FINDINGS  There are occasional small calcified spur hepatic granulomas. Liver otherwise appears unremarkable. No liver masses are identified. Gallbladder wall is not appreciably thickened. There is no biliary duct dilatation.  There are calcified splenic granulomas. Spleen otherwise appears normal.  Pancreas and  adrenals appear normal. Kidneys bilaterally show no mass or hydronephrosis on either side. There is no renal or ureteral calculus on either side.  In the pelvis, the urinary bladder is midline with normal wall thickness. There is no new pelvic mass or fluid collection. There is fat in each inguinal ring. There are sigmoid diverticula without diverticulitis. There are prostatic calculi present.  Appendix appears normal. Terminal ileum appears normal. There is lipomatous infiltration of the ileocecal valve.  There is no bowel obstruction. No free air or portal venous air. There is no appreciable ascites, adenopathy, or abscess in the abdomen or pelvis. There is atherosclerotic change in the aorta but no aneurysm. There are no blastic or lytic bone lesions.  IMPRESSION: CT chest: No edema or consolidation. No adenopathy. There is a 2 mm nodular opacity in the posterior segment right upper lobe. Followup of this nodular opacity should be based on Fleischner Society guidelines. If the patient is at high risk for bronchogenic carcinoma, follow-up chest CT at 1 year is recommended. If the patient is at low risk, no follow-up is needed. This recommendation follows the consensus statement: Guidelines for Management of Small Pulmonary Nodules Detected on CT Scans: A Statement from the [place] as published in Radiology 2005; 237:395-400. No other parenchymal lung nodular lesions seen. There are multiple foci of coronary artery calcification.  CT abdomen and pelvis: There is no neoplastic focus appreciable. There is no adenopathy. There are granulomas in the liver and spleen. There is no bowel obstruction. No abscess. There is sigmoid diverticulosis without diverticulitis. There are prostatic calculi present. There is lipomatous infiltration of the ileocecal valve.   Electronically Signed   By: Lowella Grip M.D.   On: 07/02/2014 19:17   Mr [name] IZ Contrast  07/08/2014   CLINICAL DATA:  Follow-up exam  status post right parietal craniotomy for resection of tumor.  EXAM: MRI HEAD WITHOUT AND WITH CONTRAST  TECHNIQUE: Multiplanar, multiecho pulse sequences of the brain and surrounding structures were obtained without and with intravenous contrast.  CONTRAST:  72mL MULTIHANCE GADOBENATE DIMEGLUMINE 529 MG/ML IV SOLN  COMPARISON:  Prior MRI from 07/02/2014.  FINDINGS: Postoperative changes from interval right parietal craniotomy with resection for previously identified right parietal lobe masses is seen. Postoperative changes with blood products seen within the resection cavities. There is a small amount of adjacent restricted diffusion at the medial aspect of the more superior cavity, compatible with peri-resection infarct (series 4, image 18). On post-contrast sequences, there is an irregular area of irregular enhancement measuring 1.3 x 0.5 x 0.5 cm at the anterior margin of the more superior resection cavity may reflect residual tumor versus normal choroid within the compressed occipital horn of the right lateral  ventricle (series 11, image 13). Serpiginous enhancement at the anterior margin of the resection cavity seen on series 11, image 14 may also reflect small amount of residual tumor versus postoperative enhancement. There is mild fairly linear enhancement about the more inferior resection cavity, which may reflect postoperative changes. No definite residual tumor seen at this site. Dural enhancement within the right parietal occipital region compatible postoperative changes.  Vasogenic edema within the right parieto-occipital and temporal regions persists but is slightly improved. There is decreased edema involving the splenium of the corpus callosum. Mass effect on the right lateral ventricle is slightly improved. There is persistent 7 mm of right-to-left midline shift. The basilar cisterns remain patent.  There is a tiny focus of restricted diffusion within the its posterior aspect of the splenium just to  the right of midline, which may reflect a tiny infarct (series 4, image 17). There is an additional small infarct along the gray-white matter differentiation within the left frontal lobe measuring 6 mm (series 4, image 21), stable from prior.  Postoperative changes present within the right parieto-occipital scalp. No acute abnormality seen about the orbits. Paranasal sinuses and mastoid air cells remain clear.  IMPRESSION: 1. Postoperative changes from interval right occipital craniotomy with resection of right parieto-occipital mass lesions. 2. 1.3 x 0.5 x 0.5 cm area of enhancement at the anterior margin of the more superior resection cavity in the right parieto-occipital region. It is unclear whether this reflects residual tumor or possibly normal enhancement of the choroid plexus in the adjacent compressed occipital horn of the right lateral ventricle. Attention on follow-up recommended. 3. Fairly linear enhancement about the more inferior resection cavity without definite evidence of residual tumor. 4. Small amount of associated postoperative infarct at the medial aspect of the more superior resection site. 5. Stable 6 mm focus of restricted diffusion in the left frontal lobe, compatible with an acute or subacute infarct. 6. Slightly improved vasogenic edema within the right parieto-occipital region with persistent 7 mm of right-to-left midline shift.   Electronically Signed   By: Jeannine Boga M.D.   On: 07/08/2014 23:45   Mr [name] YF Contrast  07/02/2014   ADDENDUM REPORT: 07/02/2014 21:05  ADDENDUM: Disregard the above report. Wrong patient. See correct report below.  EXAM: MRI HEAD WITHOUT AND WITH CONTRAST  CLINICAL DATA:  Two weeks headache and dizziness.  Abnormal CT.  CONTRAST:  20 mL MultiHance IV  COMPARISON:  CT head 07/02/2014  FINDINGS: FINDINGS  Two enhancing mass lesions in the right parietal lobe with extensive surrounding white matter edema. Right lower parietal lobe mass measures  22 x 20 mm with irregular enhancement and central nonenhancing necrotic material. Larger right parietal enhancing mass lesion measures 53 x 28 mm and has irregular wall enhancement and central necrosis. There is edema/tumor extending into the splenium of the corpus callosum and anteriorly into the temporal white matter. There is considerable mass-effect on the right occipital horn which is displaced anteriorly. There is mild midline shift of approximately 7 mm to the left.  Ventricle size is normal.  3 cm mass in the right parotid gland with complex signal characteristics. This could represent a benign or malignant neoplasm in the left parotid gland. Based on the imaging characteristics in the right parietal lesion, these are likely two separate processes.  Subcentimeter focus of restricted diffusion in the left frontal subcortical white matter, probably a small area of acute infarction. This does not enhance. Less likely is an area of nonenhancing  tumor .  IMPRESSION:  Two adjacent enhancing mass lesions in the right parietal lobe with central necrosis and extensive surrounding edema and mass effect. Hyperintensity extends into the splenium of the corpus callosum. 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 Metastatic disease is a less likely consideration. 7 mm midline shift to the left.  Subcentimeter focus of restricted diffusion in the left frontal lobe likely an area of acute or subacute infarction.  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   Electronically Signed   By: Franchot Gallo M.D.   On: 07/02/2014 21:05   07/02/2014   CLINICAL DATA:  Postop tumor resection 07/01/2014. Now with left leg weakness. 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  EXAM: MRI HEAD WITHOUT AND WITH CONTRAST  TECHNIQUE: Multiplanar, multiecho pulse sequences of the brain and surrounding structures were obtained without and with intravenous contrast.  CONTRAST:  20 mL  MultiHance IV  COMPARISON:  MRI 06/15/2014  FINDINGS: Postop day 1 bilateral parietal craniotomy for bilateral meningioma resection.  Right parasagittal meningioma resection. Surgical cavity is filled with blood on the right. There is enhancement of the wall of the surgical cavity without masslike features. Although it is only been 24 hr, this enhancement pattern may be postop enhancement rather than residual tumor. Continued close follow-up recommended.  Left parasagittal meningioma resection. Postop surgical cavity containing blood. Mild peripheral enhancement around the cavity similar that seen on the right may be postop enhancement.  Diffusion-weighted imaging reveals several areas of restricted diffusion consistent with acute infarct. Small area of acute infarction in the right parietal cortex, several cm from the resection site. Small areas of restricted diffusion in the frontal lobes bilaterally compatible with small areas of infarction. Restricted diffusion is also present over the convexity bilaterally near the resection site most consistent with infarct adjacent to the tumor resection site. This measures approximately 10 x 15 mm on the right and 15 mm by 20 mm on the left.  Calcified meningioma along the falx measuring approximate 1 cm is unchanged and was not resected.  Ventricle size is normal.  No shift of the midline structures.  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 Post surgical cavity filled with blood bilaterally. There is thin linear enhancement along the wall of the surgical cavity bilaterally likely due to early postoperative enhancement. Continued followup is suggested to rule out residual tumor.  Acute infarction adjacent to the resection cavity bilaterally. Also remote small areas of infarct in the right parietal lobe and bilateral frontal lobes. Question emboli  Electronically Signed: By: Franchot Gallo M.D. On: 07/02/2014 20:16    Ct Abdomen Pelvis W Contrast  07/02/2014   CLINICAL DATA:  Patient will brain tumor and left-sided weakness  EXAM: CT CHEST, ABDOMEN, AND PELVIS WITH CONTRAST  TECHNIQUE: Multidetector CT imaging of the chest, abdomen and pelvis was performed following the standard protocol during bolus administration of intravenous contrast. Oral contrast was administered for this CT abdomen and pelvis studies.  CONTRAST:  163mL OMNIPAQUE IOHEXOL 300 MG/ML  SOLN  COMPARISON:  None.  FINDINGS: CT CHEST FINDINGS  There is minimal scarring in the inferior lingula. There is no lung edema or consolidation. On axial slice 18 series 4, there is a 2 mm nodular opacity in the posterior segment of the right upper lobe. The lungs are otherwise clear. There is no appreciable thoracic adenopathy. There are a few small mediastinal lymph nodes which do not meet  size criteria for pathologic significance. There are small calcified sub- carinal and left hilar lymph nodes consistent with prior granulomatous disease.  There are multiple foci of coronary artery calcification. Pericardium is not thickened.  There are no blastic or lytic bone lesions in the thoracic region. Visualized thyroid appears normal.  CT ABDOMEN AND PELVIS FINDINGS  There are occasional small calcified spur hepatic granulomas. Liver otherwise appears unremarkable. No liver masses are identified. Gallbladder wall is not appreciably thickened. There is no biliary duct dilatation.  There are calcified splenic granulomas. Spleen otherwise appears normal.  Pancreas and adrenals appear normal. Kidneys bilaterally show no mass or hydronephrosis on either side. There is no renal or ureteral calculus on either side.  In the pelvis, the urinary bladder is midline with normal wall thickness. There is no new pelvic mass or fluid collection. There is fat in each inguinal ring. There are sigmoid diverticula without diverticulitis. There are prostatic calculi present.  Appendix appears  normal. Terminal ileum appears normal. There is lipomatous infiltration of the ileocecal valve.  There is no bowel obstruction. No free air or portal venous air. There is no appreciable ascites, adenopathy, or abscess in the abdomen or pelvis. There is atherosclerotic change in the aorta but no aneurysm. There are no blastic or lytic bone lesions.  IMPRESSION: CT chest: No edema or consolidation. No adenopathy. There is a 2 mm nodular opacity in the posterior segment right upper lobe. Followup of this nodular opacity should be based on Fleischner Society guidelines. If the patient is at high risk for bronchogenic carcinoma, follow-up chest CT at 1 year is recommended. If the patient is at low risk, no follow-up is needed. This recommendation follows the consensus statement: Guidelines for Management of Small Pulmonary Nodules Detected on CT Scans: A Statement from the [place] as published in Radiology 2005; 237:395-400. No other parenchymal lung nodular lesions seen. There are multiple foci of coronary artery calcification.  CT abdomen and pelvis: There is no neoplastic focus appreciable. There is no adenopathy. There are granulomas in the liver and spleen. There is no bowel obstruction. No abscess. There is sigmoid diverticulosis without diverticulitis. There are prostatic calculi present. There is lipomatous infiltration of the ileocecal valve.   Electronically Signed   By: Lowella Grip M.D.   On: 07/02/2014 19:17    Antibiotics:  Anti-infectives    Start     Dose/Rate Route Frequency Ordered Stop   07/07/14 1300  ceFAZolin (ANCEF) IVPB 2 g/50 mL premix     2 g100 mL/hr over 30 Minutes Intravenous Every 8 hours 07/07/14 1249 07/07/14 2227   07/07/14 0855  bacitracin 50,000 Units in sodium chloride irrigation 0.9 % 500 mL irrigation  Status:  Discontinued       As needed 07/07/14 0856 07/07/14 1207      Discharge Exam: Blood pressure 124/72, pulse 59, temperature 97.9 F (36.6 C),  temperature source Oral, resp. rate 20, height 5\' 11"  (1.803 m), weight 245 lb 6 oz (111.3 kg), SpO2 100 %. Neurologic: Grossly normal except for left visual field cut Dressing dry  Discharge Medications:     Medication List    STOP taking these medications        amoxicillin-clavulanate 875-125 MG per tablet  Commonly known as:  AUGMENTIN     sildenafil 100 MG tablet  Commonly known as:  VIAGRA      TAKE these medications        aspirin 81 MG chewable tablet  Chew  81 mg by mouth daily.     dexamethasone 4 MG tablet  Commonly known as:  DECADRON  Take 1 tablet (4 mg total) by mouth 3 (three) times daily.     fluticasone 50 MCG/ACT nasal spray  Commonly known as:  FLONASE  Place 2 sprays into both nostrils at bedtime.     Guaifenesin 1200 MG Tb12  Commonly known as:  MUCINEX MAXIMUM STRENGTH  Take 1 tablet (1,200 mg total) by mouth every 12 (twelve) hours as needed.     HYDROcodone-acetaminophen 5-325 MG per tablet  Commonly known as:  NORCO/VICODIN  Take 1 tablet by mouth every 4 (four) hours as needed for moderate pain.     levothyroxine 50 MCG tablet  Commonly known as:  SYNTHROID, LEVOTHROID  Take 50 mcg by mouth daily before breakfast. TAKE ONE TABLET BY MOUTH EVERY DAY     lisinopril 40 MG tablet  Commonly known as:  PRINIVIL,ZESTRIL  Take 40 mg by mouth daily. TAKE ONE TABLET BY MOUTH EVERY DAY     meclizine 25 MG tablet  Commonly known as:  ANTIVERT  Take 1 tablet (25 mg total) by mouth 3 (three) times daily as needed for dizziness.     verapamil 180 MG (CO) 24 hr tablet  Commonly known as:  COVERA HS  Take 180 mg by mouth at bedtime.        Disposition: home   Final Dx: craniotomy for brain tumor      Discharge Instructions     Remove dressing in 72 hours    Complete by:  As directed      Call MD for:  difficulty breathing, headache or visual disturbances    Complete by:  As directed      Call MD for:  persistant dizziness or  light-headedness    Complete by:  As directed      Call MD for:  persistant nausea and vomiting    Complete by:  As directed      Call MD for:  redness, tenderness, or signs of infection (pain, swelling, redness, odor or green/yellow discharge around incision site)    Complete by:  As directed      Call MD for:  severe uncontrolled pain    Complete by:  As directed      Call MD for:  temperature >100.4    Complete by:  As directed      Diet - low sodium heart healthy    Complete by:  As directed      Discharge instructions    Complete by:  As directed   May shower, no strenuous activity, no driving     Increase activity slowly    Complete by:  As directed            Follow-up Information    Follow up with CRAM,GARY P, MD. Schedule an appointment as soon as possible for a visit in 1 week.   Specialty:  Neurosurgery   Contact information:   [address] [place] [address] [phone]        Signed: Eustace Moore 07/09/2014, 8:41 AM

## 2014-07-12 ENCOUNTER — Encounter (HOSPITAL_COMMUNITY): Payer: Self-pay | Admitting: Neurosurgery

## 2014-07-18 ENCOUNTER — Other Ambulatory Visit (HOSPITAL_COMMUNITY)
Admission: RE | Admit: 2014-07-18 | Discharge: 2014-07-18 | Disposition: A | Discharge status: Home / Self Care | Payer: Commercial Managed Care - PPO | Source: Ambulatory Visit | Attending: Radiation Oncology | Admitting: Radiation Oncology

## 2014-07-18 DIAGNOSIS — C714 Malignant neoplasm of occipital lobe: Secondary | ICD-10-CM | POA: Diagnosis present

## 2014-07-20 ENCOUNTER — Telehealth: Payer: Self-pay | Admitting: Hematology

## 2014-07-20 NOTE — Telephone Encounter (Signed)
S/W PATIENT AND GAVE NP APPT FOR 11/20 @ 2:30 W/DR. FENG. DX- NEWLY DX GBM INFORMATION LOCATED IN EPIC FOR REVIEW, UNDER THE NOTES TAB

## 2014-07-21 NOTE — Progress Notes (Signed)
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 Jake [name] presented on 07/02/14, to the emergency department, with two-week history of headaches, balance difficulty weakness in the low left side and visual field deficit. Workup revealed 2 ring-enhancing lesions in the parietaloccipital lobe on the right with mass effect. Metastatic workup was negative so patient was recommended stereotactic craniotomy for excision of both masses   Past or anticipated interventions, if any, per neurosurgery: Dr. Delene Loll - Right occipital stereotactic craniotomy for resection of 2 brain masses  07/08/11 Diagnosis 1. Brain, biopsy - GLIOBLASTOMA MULTIFORME, WHO GRADE IV/IV. - SEE ONCOLOGY TABLE BELOW. 2. Brain, for tumor resection, higher right parietal - GLIOBLASTOMA MULTIFORME, WHO GRADE IV/IV. - SEE ONCOLOGY TABLE BELOW. 3. Brain, for tumor resection, lower right occipital - GLIOBLASTOMA MULTIFORME, WHO GRADE IV/IV  Past or anticipated interventions, if any, per medical oncology: Dr. Truitt Merle -  Visit on11/20/15  Dose of Decadron, if applicable: 2mg  po daily -tapering dose  Recent neurologic symptoms, if any:   Seizures: None  Headaches: started 07/02/14 and post surgical headache from right posterior scalp incision and noting increased headaches since steroid dose decreased to 2 mg BID presently -tapered dose  Nausea: None   Dizziness/ataxia: Yes  Difficulty with hand coordination: fine motor movement improved.  Able to button clothes  Focal numbness/weakness: 07/02/14 Weakness low left side on presentation . But better presently  Visual deficits/changes: 07/02/14 and reports Vision very blurred today  Confusion/Memory deficits: None  Painful bone metastases at present, if any: None  SAFETY ISSUES:  Prior radiation?NO  Pacemaker/ICD? NO  Possible current pregnancy?N/A  Is the patient on methotrexate? NO  Additional  Complaints / other details:

## 2014-07-22 ENCOUNTER — Ambulatory Visit: Payer: Commercial Managed Care - PPO

## 2014-07-22 ENCOUNTER — Encounter: Payer: Self-pay | Admitting: Radiation Oncology

## 2014-07-22 ENCOUNTER — Ambulatory Visit
Admission: RE | Admit: 2014-07-22 | Discharge: 2014-07-22 | Disposition: A | Discharge status: Home / Self Care | Payer: Commercial Managed Care - PPO | Source: Ambulatory Visit | Attending: Radiation Oncology | Admitting: Radiation Oncology

## 2014-07-22 ENCOUNTER — Ambulatory Visit (HOSPITAL_BASED_OUTPATIENT_CLINIC_OR_DEPARTMENT_OTHER): Payer: Commercial Managed Care - PPO | Admitting: Hematology

## 2014-07-22 ENCOUNTER — Encounter: Payer: Self-pay | Admitting: Hematology

## 2014-07-22 VITALS — BP 125/69 | HR 84 | Temp 98.5°F | Ht 71.0 in | Wt 239.8 lb

## 2014-07-22 VITALS — BP 129/70 | HR 74 | Temp 98.6°F | Resp 19 | Ht 71.0 in | Wt 239.0 lb

## 2014-07-22 DIAGNOSIS — I10 Essential (primary) hypertension: Secondary | ICD-10-CM | POA: Insufficient documentation

## 2014-07-22 DIAGNOSIS — R531 Weakness: Secondary | ICD-10-CM | POA: Diagnosis not present

## 2014-07-22 DIAGNOSIS — G4700 Insomnia, unspecified: Secondary | ICD-10-CM

## 2014-07-22 DIAGNOSIS — Z79899 Other long term (current) drug therapy: Secondary | ICD-10-CM | POA: Insufficient documentation

## 2014-07-22 DIAGNOSIS — F17210 Nicotine dependence, cigarettes, uncomplicated: Secondary | ICD-10-CM | POA: Insufficient documentation

## 2014-07-22 DIAGNOSIS — E079 Disorder of thyroid, unspecified: Secondary | ICD-10-CM | POA: Insufficient documentation

## 2014-07-22 DIAGNOSIS — Z7982 Long term (current) use of aspirin: Secondary | ICD-10-CM | POA: Insufficient documentation

## 2014-07-22 DIAGNOSIS — R51 Headache: Secondary | ICD-10-CM

## 2014-07-22 DIAGNOSIS — Z510 Encounter for antineoplastic radiation therapy: Secondary | ICD-10-CM | POA: Diagnosis present

## 2014-07-22 DIAGNOSIS — Z7952 Long term (current) use of systemic steroids: Secondary | ICD-10-CM | POA: Diagnosis not present

## 2014-07-22 DIAGNOSIS — C719 Malignant neoplasm of brain, unspecified: Secondary | ICD-10-CM | POA: Insufficient documentation

## 2014-07-22 DIAGNOSIS — C713 Malignant neoplasm of parietal lobe: Secondary | ICD-10-CM

## 2014-07-22 DIAGNOSIS — C714 Malignant neoplasm of occipital lobe: Secondary | ICD-10-CM | POA: Diagnosis not present

## 2014-07-22 MED ORDER — TEMAZEPAM 15 MG PO CAPS: 15.0000 mg | ORAL_CAPSULE | Freq: Every evening | ORAL | Status: DC | PRN

## 2014-07-22 NOTE — Progress Notes (Addendum)
[place] NOTE  Patient Care Team: [name], MD as PCP - General (Family Medicine) Elaina Hoops, MD as Consulting Physician (Neurosurgery) Eppie Gibson, MD as Attending Physician (Radiation Oncology) Truitt Merle, MD as Consulting Physician (Hematology)     Glioblastoma of occipital lobe   07/02/2014 Imaging Brain MRI: Two adjacent enhancing mass lesions in the right parietal lobe with central necrosis and extensive surrounding edema and mass effect.  7 mm midline shift to the left.  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   CT CAP(-)   07/07/2014 Surgery Subtotal right parietal tumors resection      CHIEF COMPLAINTS/PURPOSE OF CONSULTATION:  Newly diagnosed GBM  HISTORY OF PRESENTING ILLNESS:  Jake [name] 58 y.o. male was newly diagnosed GBM, presents to my clinic to discuss adjuvant chemoradiation.  He initially presented was headaches and sinus congestion in mid October 2015. He was seen by his primary care physician and was treated for sinus infection. He subsequently developed left sided visual deficit, left arm and leg weakness and unsteady gait. He was sent to emergency room by his primary care physician on 07/02/2014. CT  And MRI of brain reviewed to add adjacent enhancing mass lesions in the right parietal lobe with central necrosis and extensive surrounding edema and mass effect. He was admitted and underwent right craniotomy  For resection of the 2 brain masses by Dr. Lonell Grandchild. Postsurgical brain MRI revealed a 1.3 cm area of enhancement at the anterior margin of surgical resection cavity, likely surgical change. His case was reviewed in the tumor board and it was felt he had subtotal resection.  He has recovered well from surgery. His left side weakness has  Nearly resolved, he is able to walk independently without any difficulty. His left-sided visual deficit is  Unchanged. He is dexamethasone has been tapered down to 2 mg twice a day, he  has mild headaches, and insomnia. He otherwise feels well in general without any other complaints. He states he had mild short-term memory issue, which has resolved after surgery. He has no recent weight loss.  MEDICAL HISTORY:  Past Medical History  Diagnosis Date  . Thyroid disease   . Hypertension   . Allergy   . Arthritis   .    Marland Kitchen Kidney stone     SURGICAL HISTORY: Past Surgical History  Procedure Laterality Date  . Knee arthroscopy w/ meniscal repair    . Treatment fistula anal    . 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 Marital Status: Married    Spouse Name: N/A    Number of Children: 5 children, age of 57-21  . Years of Education: N/A   Occupational History  . 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    Social History Main Topics  . Smoking status: Current Every Day Smoker -- 1.00 packs/day for 45 years    Types: Cigarettes  . Smokeless tobacco: Never Used  . Alcohol Use: Yes     Comment: 2-3 beers per/day   . Drug Use: No  . Sexual Activity: Not on file     FAMILY HISTORY: Family History  Problem Relation Age of Onset  . Hypertension Mother   . Diabetes Father   . Glaucoma Father   .  Diabetes Brother   . Diabetes Brother   . Colon cancer Neg Hx   Paternal ancle had prostate cancer. No other family history of malignancy.  ALLERGIES:  has No Known Allergies.   MEDICATIONS:  Current Outpatient Prescriptions  Medication Sig Dispense Refill  . aspirin 81 MG chewable tablet Chew 81 mg by mouth daily.    Marland Kitchen b complex vitamins tablet Take 1 tablet by mouth daily.    . cyclobenzaprine (FLEXERIL) 10 MG tablet   0  . dexamethasone (DECADRON) 4 MG tablet Take 1 tablet (4 mg total) by mouth 3 (three) times daily. 50 tablet 0  . doxycycline (MONODOX) 100  MG capsule   0  . HYDROcodone-acetaminophen (NORCO/VICODIN) 5-325 MG per tablet Take 1 tablet by mouth every 4 (four) hours as needed for moderate pain. 60 tablet 0  . levothyroxine (SYNTHROID, LEVOTHROID) 50 MCG tablet Take 50 mcg by mouth daily before breakfast. TAKE ONE TABLET BY MOUTH EVERY DAY    . lisinopril (PRINIVIL,ZESTRIL) 40 MG tablet Take 40 mg by mouth daily. TAKE ONE TABLET BY MOUTH EVERY DAY    . Multiple Vitamins-Minerals (MULTIVITAMIN WITH MINERALS) tablet Take 1 tablet by mouth daily.    Marland Kitchen nystatin (MYCOSTATIN) 100000 UNIT/ML suspension   0  . Probiotic Product (PROBIOTIC DAILY PO) Take by mouth.    . temazepam (RESTORIL) 15 MG capsule Take 1 capsule (15 mg total) by mouth at bedtime as needed for sleep. 30 capsule 0  . verapamil (COVERA HS) 180 MG (CO) 24 hr tablet Take 180 mg by mouth at bedtime.     No current facility-administered medications for this visit.    REVIEW OF SYSTEMS:   Constitutional: Denies fevers, chills or abnormal night sweats Eyes: Denies blurriness of vision, double vision or watery eyes Ears, nose, mouth, throat, and face: Denies mucositis or sore throat Respiratory: Denies cough, dyspnea or wheezes Cardiovascular: Denies palpitation, chest discomfort or lower extremity swelling Gastrointestinal:  Denies nausea, heartburn or change in bowel habits Skin: Denies abnormal skin rashes Lymphatics: Denies new lymphadenopathy or easy bruising Neurological:positive for mild headaches, and left side vision deficits. His left-sided weakness has nearly resolved. Denies numbness, tingling or new weaknesses Behavioral/Psych: Mood is stable, no new changes  All other systems were reviewed with the patient and are negative.  PHYSICAL EXAMINATION: ECOG PERFORMANCE STATUS: 1 - Symptomatic but completely ambulatory  KPS: 90%  Filed Vitals:   07/22/14 1436  BP: 129/70  Pulse: 74  Temp: 98.6 F (37 C)  Resp: 19   Filed Weights   07/22/14 1436  Weight: 239  lb (108.41 kg)    GENERAL:alert, no distress and comfortable SKIN: skin color, texture, turgor are normal, no rashes or significant lesions HEAD: right-sided posterior craniotomy surgical wound is healing well, no surrounding edema, skin redness or discharge EYES: normal, conjunctiva are pink and non-injected, sclera clear OROPHARYNX:no exudate, no erythema and lips, buccal mucosa, and tongue normal  NECK: supple, thyroid normal size, non-tender, without nodularity LYMPH:  no palpable lymphadenopathy in the cervical, axillary or inguinal LUNGS: clear to auscultation and percussion with normal breathing effort HEART: regular rate & rhythm and no murmurs and no lower extremity edema ABDOMEN:abdomen soft, non-tender and normal bowel sounds Musculoskeletal:no cyanosis of digits and no clubbing  PSYCH: alert & oriented x 3 with fluent speech NEURO: cranial nerve 2-12 are intact. Left side vision deficits is pressure in the left upper and mid quadrant. Motor and sensitivity are normal and symmetric on all extremities. Speech  is fluent, coordination is grossly intact. Ambulates independently and Lutricia Feil sign negative.  LABORATORY DATA:  I have reviewed the data as listed Lab Results  Component Value Date   WBC 6.4 07/02/2014   HGB 15.2 07/02/2014   HCT 44.5 07/02/2014   MCV 87.8 07/02/2014   PLT 198 07/02/2014    Recent Labs  01/25/14 1015 07/02/14 1518 07/02/14 1548 07/08/14 0644  NA 132* 136* 136* 134*  K 4.4 4.0 4.1 4.7  CL 99 102 100 99  CO2 26  --  24 23  GLUCOSE 102* 102* 96 100*  BUN 12 12 12  26*  CREATININE 0.86 0.80 0.85 0.75  CALCIUM 9.6  --  9.3 8.4  GFRNONAA  --   --  >90 >90  GFRAA  --   --  >90 >90  PROT 6.7  --  6.9  --   ALBUMIN 4.2  --  3.7  --   AST 26  --  22  --   ALT 21  --  20  --   ALKPHOS 62  --  57  --   BILITOT 0.7  --  0.5  --     Surgical path 07/07/2014  Diagnosis 1. Brain, biopsy - GLIOBLASTOMA MULTIFORME, WHO GRADE IV/IV. - SEE ONCOLOGY  TABLE BELOW. 2. Brain, for tumor resection, higher right parietal - GLIOBLASTOMA MULTIFORME, WHO GRADE IV/IV. - SEE ONCOLOGY TABLE BELOW. 3. Brain, for tumor resection, lower right occipital - GLIOBLASTOMA MULTIFORME, WHO GRADE IV/IV   Microscopic Comment 1. -3. ONCOLOGY TABLE - BRAIN AND SPINAL CORD 1. Procedure: Resection x2 2. Tumor site, including laterality: Right parietal and right occipital 3. Maximum tumor size (cm): At least 1.8 cm 4. Histologic type: Glioblastoma multiforme 5. Grade: WHO Grade IV/IV 6. Margins (if applicable): Can not be assessed 7. Ancillary studies: Can be performed upon clinician request.  RADIOGRAPHIC STUDIES: I have personally reviewed the radiological images as listed and agreed with the findings in the report.  Ct Head Wo Contrast 07/02/2014    IMPRESSION: Changes consistent with a focal neoplasm within the right posterior parietal region with significant surrounding white matter edema and midline shift. MRI with contrast material is recommended for further evaluation.     Mr [name] Wo Contrast 07/08/2014    IMPRESSION:  1. Postoperative changes from interval right occipital craniotomy with resection of right parieto-occipital mass lesions.  2. 1.3 x 0.5 x 0.5 cm area of enhancement at the anterior margin of the more superior resection cavity in the right parieto-occipital region. It is unclear whether this reflects residual tumor or possibly normal enhancement of the choroid plexus in the adjacent compressed occipital horn of the right lateral ventricle. Attention on follow-up recommended.  3. Fairly linear enhancement about the more inferior resection cavity without definite evidence of residual tumor.  4. Small amount of associated postoperative infarct at the medial aspect of the more superior resection site.  5. Stable 6 mm focus of restricted diffusion in the left frontal lobe, compatible with an acute or subacute infarct.  6. Slightly improved  vasogenic edema within the right parieto-occipital region with persistent 7 mm of right-to-left midline shift.     Electronically Signed   By: Jeannine Boga M.D.   On: 07/08/2014 23:45   Mr Brain W KG Contrast  IMPRESSION:  Two adjacent enhancing mass lesions in the right parietal lobe with central necrosis and extensive surrounding edema and mass effect. Hyperintensity extends into the splenium of the corpus callosum. The  pattern is most consistent with glioblastoma multiform. Metastatic disease is a less likely consideration. 7 mm midline shift to the left.  Subcentimeter focus of restricted diffusion in the left frontal lobe likely an area of acute or subacute infarction.  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      Ct chest, Abdomen Pelvis W Contrast   07/02/2014    IMPRESSION: CT chest: No edema or consolidation. No adenopathy. There is a 2 mm nodular opacity in the posterior segment right upper lobe. Followup of this nodular opacity should be based on Fleischner Society guidelines. If the patient is at high risk for bronchogenic carcinoma, follow-up chest CT at 1 year is recommended. If the patient is at low risk, no follow-up is needed. This recommendation follows the consensus statement: Guidelines for Management of Small Pulmonary Nodules Detected on CT Scans: A Statement from the [employer] as published in Radiology 2005; 237:395-400. No other parenchymal lung nodular lesions seen. There are multiple foci of coronary artery calcification.  CT abdomen and pelvis: There is no neoplastic focus appreciable. There is no adenopathy. There are granulomas in the liver and spleen. There is no bowel obstruction. No abscess. There is sigmoid diverticulosis without diverticulitis. There are prostatic calculi present. There is lipomatous infiltration of the ileocecal valve.   Electronically Signed   By: Lowella Grip M.D.   On: 07/02/2014 19:17    ASSESSMENT & PLAN:   Mr. Schnabel is a 58 year old gentleman with newly diagnosed right parietal GBM, status post subtotal resection. He has recovered very well, except mild headaches and left-sided vision deficits.  We discussed his image findings and the surgical path results with patient and his wife. We discussed the standard of care of adjuvant radiation and chemotherapy with Temodar. He has recovered well from surgery and has excellent performance status. I think he is a good candidate for concurrent chemoradiation. I recommend standard dose of Temodar 75 mg/m with concurrent radiation, followed by adjuvant Temodar for 6 months. The potential side effects of Temodar, which includes but not limited to, fatigue, nausea and vomiting, dehydration, cytopenia, infection and bleeding, need for blood transfusion, etc. Were explained to patient and his wife in great detail. His wife works for pharmacy and they voiced good understanding. Chemotherapy consent was obtained during his office visit. Patient has agreed to proceed. I'll send them the prescription to his pharmacy and he will start on the first day of his radiation which is likely to be on 08/01/2014.  When he is on Temodar, I recommend monthly pentamidine inhalation for PCP prophylaxis.  I'll check his CBC weekly, CMP every other week, during his concurrent chemoradiation, and follow-up with him every 2 weeks. I'll schedule him for chemotherapy class before hisconcurrent chemoradiation. He knows to call me if he has any questions or concerns.  Orders Placed This Encounter  Procedures  . CBC with Differential    Standing Status: Future     Number of Occurrences:      Standing Expiration Date: 07/22/2015  . Comprehensive metabolic panel (Cmet) - [facility]    Standing Status: Future     Number of Occurrences:      Standing Expiration Date: 07/22/2015    All questions were answered. The patient knows to call the clinic with any problems, questions or concerns. I  spent 30 minutes counseling the patient face to face. The total time spent in the appointment was 60 minutes and more than 50% was on counseling.  Truitt Merle, MD 07/23/2014 10:54 AM

## 2014-07-22 NOTE — Progress Notes (Signed)
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 male who presented  on 07/02/14, to the emergency department, with two-week history of headaches, trouble walking, left sided weakness. MRI revealed 2 ring-enhancing lesions in the parietaloccipital lobe on the right with mass effect. Metastatic workup was negative so patient was recommended stereotactic craniotomy for excision of both masses.   Dr. Saintclair Halsted performed right occipital stereotactic craniotomy for resection of 2 brain masses.  His pre and post-op MRIs, and pathology, were reviewed at CNS tumor board this week.  He appears to have undergone STR of a Glioblastoma.    Postoperatively, L sided weakness and ambulation have improved greatly.  His HAs were initially much better, but have worsened during his Decadron taper. They are tolerable at 2mg  BID.  He has insomnia.  He has continued visual deficits in left field.    07/08/11 Diagnosis 1. Brain, biopsy - GLIOBLASTOMA MULTIFORME, WHO GRADE IV/IV. - SEE ONCOLOGY TABLE BELOW. 2. Brain, for tumor resection, higher right parietal - GLIOBLASTOMA MULTIFORME, WHO GRADE IV/IV. - SEE ONCOLOGY TABLE BELOW. 3. Brain, for tumor resection, lower right occipital - GLIOBLASTOMA MULTIFORME, WHO GRADE IV/IV  He sees Med/onc today.   PREVIOUS RADIATION THERAPY: No  PAST MEDICAL HISTORY:  has a past medical history of Thyroid disease; Hypertension; Allergy; Arthritis; Sleep apnea; and Kidney stone.    PAST SURGICAL HISTORY: Past Surgical History  Procedure Laterality Date  . Knee arthroscopy w/ meniscal repair    . Treatment fistula anal    . Craniotomy  N/A 07/07/2014    Procedure: CRANIOTOMY TUMOR EXCISION/CURVE;  Surgeon: Elaina Hoops, MD;  Location: [facility];  Service: Neurosurgery;  Laterality: N/A;    FAMILY HISTORY: family history includes Diabetes in his brother, brother, and father; Glaucoma in his father; Hypertension in his mother. There is no history of Colon cancer.  SOCIAL HISTORY:  reports that he has been smoking Cigarettes.  He has a 45 pack-year smoking history. He has never used smokeless tobacco. He reports that he drinks alcohol. He reports that he does not use illicit drugs.  ALLERGIES: Review of patient's allergies indicates no known allergies.  MEDICATIONS:  Current Outpatient Prescriptions  Medication Sig Dispense Refill  . b complex vitamins tablet Take 1 tablet by mouth daily.    Marland Kitchen dexamethasone (DECADRON) 4 MG tablet Take 1 tablet (4 mg total) by mouth 3 (three) times daily. 50 tablet 0  . HYDROcodone-acetaminophen (NORCO/VICODIN) 5-325 MG per tablet Take 1 tablet by mouth every 4 (four) hours as needed for moderate pain. 60 tablet 0  . levothyroxine (SYNTHROID, LEVOTHROID) 50 MCG tablet Take 50 mcg by mouth daily before breakfast. TAKE ONE TABLET BY MOUTH EVERY DAY    . lisinopril (PRINIVIL,ZESTRIL) 40 MG tablet Take 40 mg by mouth daily. TAKE ONE TABLET BY MOUTH EVERY DAY    . Multiple Vitamins-Minerals (MULTIVITAMIN WITH MINERALS) tablet Take 1 tablet by mouth daily.    . Probiotic Product (PROBIOTIC DAILY PO) Take by mouth.    . verapamil (COVERA HS) 180 MG (CO) 24 hr tablet Take 180 mg by mouth at bedtime.    Marland Kitchen aspirin 81 MG chewable tablet Chew 81 mg  by mouth daily.    . cyclobenzaprine (FLEXERIL) 10 MG tablet   0  . doxycycline (MONODOX) 100 MG capsule   0  . nystatin (MYCOSTATIN) 100000 UNIT/ML suspension   0   No current facility-administered medications for this encounter.    REVIEW OF SYSTEMS:  Notable for that above.   PHYSICAL EXAM:  height is 5\' 11"  (1.803 m) and weight is 239 lb 12.8 oz  (108.773 kg). His temperature is 98.5 F (36.9 C). His blood pressure is 125/69 and his pulse is 84.   General: Alert and oriented, in no acute distress HEENT: Head is normocephalic. Extraocular movements are intact. Oropharynx is clear. Neck: Neck is supple, no palpable cervical or supraclavicular lymphadenopathy. Heart: Regular in rate and rhythm with no murmurs, rubs, or gallops. Chest: Clear to auscultation bilaterally, with no rhonchi, wheezes, or rales. Abdomen: Soft, nontender, nondistended, with no rigidity or guarding. Extremities: No cyanosis or edema. Lymphatics: see Neck Exam Skin: No concerning lesions. Musculoskeletal: symmetric strength and muscle tone throughout. Neurologic: Significant visual deficit in left lower quadrant, both eyes.  Left upper quadrant deficit as well, but less profound.Marland Kitchen Speech is fluent. Coordination is grossly intact. Ambulates independently.  MMSE 29/30. Psychiatric: Judgment and insight are intact. Affect is appropriate.   ECOG = 1  0 - Asymptomatic (Fully active, able to carry on all predisease activities without restriction)  1 - Symptomatic but completely ambulatory (Restricted in physically strenuous activity but ambulatory and able to carry out work of a light or sedentary nature. For example, light housework, office work)  2 - Symptomatic, <50% in bed during the day (Ambulatory and capable of all self care but unable to carry out any work activities. Up and about more than 50% of waking hours)  3 - Symptomatic, >50% in bed, but not bedbound (Capable of only limited self-care, confined to bed or chair 50% or more of waking hours)  4 - Bedbound (Completely disabled. Cannot carry on any self-care. Totally confined to bed or chair)  5 - Death   Eustace Pen MM, Creech RH, Tormey DC, et al. [phone]). "Toxicity and response criteria of the [facility]". [place] Oncol. 5 (6): 649-55   LABORATORY DATA:  Lab Results    Component Value Date   WBC 6.4 07/02/2014   HGB 15.2 07/02/2014   HCT 44.5 07/02/2014   MCV 87.8 07/02/2014   PLT 198 07/02/2014   CMP     Component Value Date/Time   NA 134* 07/08/2014 0644   K 4.7 07/08/2014 0644   CL 99 07/08/2014 0644   CO2 23 07/08/2014 0644   GLUCOSE 100* 07/08/2014 0644   BUN 26* 07/08/2014 0644   CREATININE 0.75 07/08/2014 0644   CREATININE 0.86 01/25/2014 1015   CALCIUM 8.4 07/08/2014 0644   PROT 6.9 07/02/2014 1548   ALBUMIN 3.7 07/02/2014 1548   AST 22 07/02/2014 1548   ALT 20 07/02/2014 1548   ALKPHOS 57 07/02/2014 1548   BILITOT 0.5 07/02/2014 1548   GFRNONAA >90 07/08/2014 0644   GFRAA >90 07/08/2014 0644         RADIOGRAPHY: Ct Head Wo Contrast  07/02/2014   CLINICAL DATA:  Headaches for 1 week, with cord may shin issues on the left side  EXAM: CT HEAD WITHOUT CONTRAST  TECHNIQUE: Contiguous axial images were obtained from the base of the skull through the vertex without intravenous contrast.  COMPARISON:  None.  FINDINGS: The bony calvarium is intact. Paranasal sinuses show  some mild mucosal thickening within the ethmoid air cells.  There is a somewhat ovoid area decreased attenuation identified in the right posterior parietal region near the vertex which measures at least 4.1 x 3.8 cm in dimension. It is surrounded by significant white matter edema and consistent with an underlying neoplasm. There is significant distortion of the right lateral ventricle particularly the occipital horn and there is evidence of midline shift of approximately 9-10 mm. No findings to suggest acute hemorrhage are seen. No other focal lesions are noted.  IMPRESSION: Changes consistent with a focal neoplasm within the right posterior parietal region with significant surrounding white matter edema and midline shift. MRI with contrast material is recommended for further evaluation.  Critical Value/emergent results were called by telephone at the time of interpretation on  07/02/2014 at 4:39 pm to Dr. Carlisle Cater , who verbally acknowledged these results.   Electronically Signed   By: Inez Catalina M.D.   On: 07/02/2014 16:42   Ct Chest W Contrast  07/02/2014   CLINICAL DATA:  Patient will brain tumor and left-sided weakness  EXAM: CT CHEST, ABDOMEN, AND PELVIS WITH CONTRAST  TECHNIQUE: Multidetector CT imaging of the chest, abdomen and pelvis was performed following the standard protocol during bolus administration of intravenous contrast. Oral contrast was administered for this CT abdomen and pelvis studies.  CONTRAST:  178mL OMNIPAQUE IOHEXOL 300 MG/ML  SOLN  COMPARISON:  None.  FINDINGS: CT CHEST FINDINGS  There is minimal scarring in the inferior lingula. There is no lung edema or consolidation. On axial slice 18 series 4, there is a 2 mm nodular opacity in the posterior segment of the right upper lobe. The lungs are otherwise clear. There is no appreciable thoracic adenopathy. There are a few small mediastinal lymph nodes which do not meet size criteria for pathologic significance. There are small calcified sub- carinal and left hilar lymph nodes consistent with prior granulomatous disease.  There are multiple foci of coronary artery calcification. Pericardium is not thickened.  There are no blastic or lytic bone lesions in the thoracic region. Visualized thyroid appears normal.  CT ABDOMEN AND PELVIS FINDINGS  There are occasional small calcified spur hepatic granulomas. Liver otherwise appears unremarkable. No liver masses are identified. Gallbladder wall is not appreciably thickened. There is no biliary duct dilatation.  There are calcified splenic granulomas. Spleen otherwise appears normal.  Pancreas and adrenals appear normal. Kidneys bilaterally show no mass or hydronephrosis on either side. There is no renal or ureteral calculus on either side.  In the pelvis, the urinary bladder is midline with normal wall thickness. There is no new pelvic mass or fluid collection.  There is fat in each inguinal ring. There are sigmoid diverticula without diverticulitis. There are prostatic calculi present.  Appendix appears normal. Terminal ileum appears normal. There is lipomatous infiltration of the ileocecal valve.  There is no bowel obstruction. No free air or portal venous air. There is no appreciable ascites, adenopathy, or abscess in the abdomen or pelvis. There is atherosclerotic change in the aorta but no aneurysm. There are no blastic or lytic bone lesions.  IMPRESSION: CT chest: No edema or consolidation. No adenopathy. There is a 2 mm nodular opacity in the posterior segment right upper lobe. Followup of this nodular opacity should be based on Fleischner Society guidelines. If the patient is at high risk for bronchogenic carcinoma, follow-up chest CT at 1 year is recommended. If the patient is at low risk, no follow-up is needed. This  recommendation follows the consensus statement: Guidelines for Management of Small Pulmonary Nodules Detected on CT Scans: A Statement from the [employer] as published in Radiology 2005; 237:395-400. No other parenchymal lung nodular lesions seen. There are multiple foci of coronary artery calcification.  CT abdomen and pelvis: There is no neoplastic focus appreciable. There is no adenopathy. There are granulomas in the liver and spleen. There is no bowel obstruction. No abscess. There is sigmoid diverticulosis without diverticulitis. There are prostatic calculi present. There is lipomatous infiltration of the ileocecal valve.   Electronically Signed   By: Lowella Grip M.D.   On: 07/02/2014 19:17   Mr [name] WP Contrast  07/08/2014   CLINICAL DATA:  Follow-up exam status post right parietal craniotomy for resection of tumor.  EXAM: MRI HEAD WITHOUT AND WITH CONTRAST  TECHNIQUE: Multiplanar, multiecho pulse sequences of the brain and surrounding structures were obtained without and with intravenous contrast.  CONTRAST:  49mL MULTIHANCE  GADOBENATE DIMEGLUMINE 529 MG/ML IV SOLN  COMPARISON:  Prior MRI from 07/02/2014.  FINDINGS: Postoperative changes from interval right parietal craniotomy with resection for previously identified right parietal lobe masses is seen. Postoperative changes with blood products seen within the resection cavities. There is a small amount of adjacent restricted diffusion at the medial aspect of the more superior cavity, compatible with peri-resection infarct (series 4, image 18). On post-contrast sequences, there is an irregular area of irregular enhancement measuring 1.3 x 0.5 x 0.5 cm at the anterior margin of the more superior resection cavity may reflect residual tumor versus normal choroid within the compressed occipital horn of the right lateral ventricle (series 11, image 13). Serpiginous enhancement at the anterior margin of the resection cavity seen on series 11, image 14 may also reflect small amount of residual tumor versus postoperative enhancement. There is mild fairly linear enhancement about the more inferior resection cavity, which may reflect postoperative changes. No definite residual tumor seen at this site. Dural enhancement within the right parietal occipital region compatible postoperative changes.  Vasogenic edema within the right parieto-occipital and temporal regions persists but is slightly improved. There is decreased edema involving the splenium of the corpus callosum. Mass effect on the right lateral ventricle is slightly improved. There is persistent 7 mm of right-to-left midline shift. The basilar cisterns remain patent.  There is a tiny focus of restricted diffusion within the its posterior aspect of the splenium just to the right of midline, which may reflect a tiny infarct (series 4, image 17). There is an additional small infarct along the gray-white matter differentiation within the left frontal lobe measuring 6 mm (series 4, image 21), stable from prior.  Postoperative changes present  within the right parieto-occipital scalp. No acute abnormality seen about the orbits. Paranasal sinuses and mastoid air cells remain clear.  IMPRESSION: 1. Postoperative changes from interval right occipital craniotomy with resection of right parieto-occipital mass lesions. 2. 1.3 x 0.5 x 0.5 cm area of enhancement at the anterior margin of the more superior resection cavity in the right parieto-occipital region. It is unclear whether this reflects residual tumor or possibly normal enhancement of the choroid plexus in the adjacent compressed occipital horn of the right lateral ventricle. Attention on follow-up recommended. 3. Fairly linear enhancement about the more inferior resection cavity without definite evidence of residual tumor. 4. Small amount of associated postoperative infarct at the medial aspect of the more superior resection site. 5. Stable 6 mm focus of restricted diffusion in the left frontal lobe,  compatible with an acute or subacute infarct. 6. Slightly improved vasogenic edema within the right parieto-occipital region with persistent 7 mm of right-to-left midline shift.   Electronically Signed   By: Jeannine Boga M.D.   On: 07/08/2014 23:45   Mr [name] ZD Contrast  07/02/2014   ADDENDUM REPORT: 07/02/2014 21:05  ADDENDUM: Disregard the above report. Wrong patient. See correct report below.  EXAM: MRI HEAD WITHOUT AND WITH CONTRAST  CLINICAL DATA:  Two weeks headache and dizziness.  Abnormal CT.  CONTRAST:  20 mL MultiHance IV  COMPARISON:  CT head 07/02/2014  FINDINGS: FINDINGS  Two enhancing mass lesions in the right parietal lobe with extensive surrounding white matter edema. Right lower parietal lobe mass measures 22 x 20 mm with irregular enhancement and central nonenhancing necrotic material. Larger right parietal enhancing mass lesion measures 53 x 28 mm and has irregular wall enhancement and central necrosis. There is edema/tumor extending into the splenium of the corpus callosum  and anteriorly into the temporal white matter. There is considerable mass-effect on the right occipital horn which is displaced anteriorly. There is mild midline shift of approximately 7 mm to the left.  Ventricle size is normal.  3 cm mass in the right parotid gland with complex signal characteristics. This could represent a benign or malignant neoplasm in the left parotid gland. Based on the imaging characteristics in the right parietal lesion, these are likely two separate processes.  Subcentimeter focus of restricted diffusion in the left frontal subcortical white matter, probably a small area of acute infarction. This does not enhance. Less likely is an area of nonenhancing tumor .  IMPRESSION:  Two adjacent enhancing mass lesions in the right parietal lobe with central necrosis and extensive surrounding edema and mass effect. Hyperintensity extends into the splenium of the corpus callosum. 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 Metastatic disease is a less likely consideration. 7 mm midline shift to the left.  Subcentimeter focus of restricted diffusion in the left frontal lobe likely an area of acute or subacute infarction.  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   Electronically Signed   By: Franchot Gallo M.D.   On: 07/02/2014 21:05   07/02/2014   CLINICAL DATA:  Postop tumor resection 07/01/2014. Now with left leg weakness. 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  EXAM: MRI HEAD WITHOUT AND WITH CONTRAST  TECHNIQUE: Multiplanar, multiecho pulse sequences of the brain and surrounding structures were obtained without and with intravenous contrast.  CONTRAST:  20 mL MultiHance IV  COMPARISON:  MRI 06/15/2014  FINDINGS: Postop day 1 bilateral parietal craniotomy for bilateral meningioma resection.  Right parasagittal meningioma resection. Surgical cavity is filled with blood on the right. There is enhancement of the wall of the surgical  cavity without masslike features. Although it is only been 24 hr, this enhancement pattern may be postop enhancement rather than residual tumor. Continued close follow-up recommended.  Left parasagittal meningioma resection. Postop surgical cavity containing blood. Mild peripheral enhancement around the cavity similar that seen on the right may be postop enhancement.  Diffusion-weighted imaging reveals several areas of restricted diffusion consistent with acute infarct. Small area of acute infarction in the right parietal cortex, several cm from the resection site. Small areas of restricted diffusion in the frontal lobes bilaterally compatible with small areas of infarction. Restricted diffusion is also present over the convexity bilaterally near the resection site most consistent with infarct adjacent to the tumor resection site.  This measures approximately 10 x 15 mm on the right and 15 mm by 20 mm on the left.  Calcified meningioma along the falx measuring approximate 1 cm is unchanged and was not resected.  Ventricle size is normal.  No shift of the midline structures.  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 Post surgical cavity filled with blood bilaterally. There is thin linear enhancement along the wall of the surgical cavity bilaterally likely due to early postoperative enhancement. Continued followup is suggested to rule out residual tumor.  Acute infarction adjacent to the resection cavity bilaterally. Also remote small areas of infarct in the right parietal lobe and bilateral frontal lobes. Question emboli  Electronically Signed: By: Franchot Gallo M.D. On: 07/02/2014 20:16   Ct Abdomen Pelvis W Contrast  07/02/2014   CLINICAL DATA:  Patient will brain tumor and left-sided weakness  EXAM: CT CHEST, ABDOMEN, AND PELVIS WITH CONTRAST  TECHNIQUE: Multidetector CT imaging of the chest, abdomen and pelvis was performed following the standard  protocol during bolus administration of intravenous contrast. Oral contrast was administered for this CT abdomen and pelvis studies.  CONTRAST:  19mL OMNIPAQUE IOHEXOL 300 MG/ML  SOLN  COMPARISON:  None.  FINDINGS: CT CHEST FINDINGS  There is minimal scarring in the inferior lingula. There is no lung edema or consolidation. On axial slice 18 series 4, there is a 2 mm nodular opacity in the posterior segment of the right upper lobe. The lungs are otherwise clear. There is no appreciable thoracic adenopathy. There are a few small mediastinal lymph nodes which do not meet size criteria for pathologic significance. There are small calcified sub- carinal and left hilar lymph nodes consistent with prior granulomatous disease.  There are multiple foci of coronary artery calcification. Pericardium is not thickened.  There are no blastic or lytic bone lesions in the thoracic region. Visualized thyroid appears normal.  CT ABDOMEN AND PELVIS FINDINGS  There are occasional small calcified spur hepatic granulomas. Liver otherwise appears unremarkable. No liver masses are identified. Gallbladder wall is not appreciably thickened. There is no biliary duct dilatation.  There are calcified splenic granulomas. Spleen otherwise appears normal.  Pancreas and adrenals appear normal. Kidneys bilaterally show no mass or hydronephrosis on either side. There is no renal or ureteral calculus on either side.  In the pelvis, the urinary bladder is midline with normal wall thickness. There is no new pelvic mass or fluid collection. There is fat in each inguinal ring. There are sigmoid diverticula without diverticulitis. There are prostatic calculi present.  Appendix appears normal. Terminal ileum appears normal. There is lipomatous infiltration of the ileocecal valve.  There is no bowel obstruction. No free air or portal venous air. There is no appreciable ascites, adenopathy, or abscess in the abdomen or pelvis. There is atherosclerotic  change in the aorta but no aneurysm. There are no blastic or lytic bone lesions.  IMPRESSION: CT chest: No edema or consolidation. No adenopathy. There is a 2 mm nodular opacity in the posterior segment right upper lobe. Followup of this nodular opacity should be based on Fleischner Society guidelines. If the patient is at high risk for bronchogenic carcinoma, follow-up chest CT at 1 year is recommended. If the patient is at low risk, no follow-up is needed. This recommendation follows the consensus statement: Guidelines for Management of Small Pulmonary Nodules Detected on CT Scans: A Statement from the [employer] as published in Radiology 2005; 237:395-400. No other parenchymal  lung nodular lesions seen. There are multiple foci of coronary artery calcification.  CT abdomen and pelvis: There is no neoplastic focus appreciable. There is no adenopathy. There are granulomas in the liver and spleen. There is no bowel obstruction. No abscess. There is sigmoid diverticulosis without diverticulitis. There are prostatic calculi present. There is lipomatous infiltration of the ileocecal valve.   Electronically Signed   By: Lowella Grip M.D.   On: 07/02/2014 19:17      IMPRESSION/PLAN: This is a lovely 58 yo old gentleman with a glioblastoma that was recently subtotally resected. I had a frank discussion with him and his wife regarding his condition and guarded prognosis, and understand he has a serious condition. They would like to be aggressive to maximize his life expectancy while maintaining his QOL.  They understand that Radiotherapy, likely with Temozolomide (to be discussed with med/onc), is the standard adjuvant treatment to delay progression of his disease.  We discussed the excellent technology available at our department to deliver postoperative radiotherapy for glioblastoma tumors. We discussed the logistics, benefits, risks, and side effects of such treatment. He understands that the standard  approach is to give 30 treatments of postoperative radiotherapy focusing on the surgical bed with margin around the surgical bed to cover microscopic disease that tends to infiltrate the brain for this type of tumor. After a detailed discussion regarding radiotherapy, including potential side effects, which may include but not necessarily be limited to fatigue, skin irritation, hair loss, headaches, nausea, worsening visual deficits, and potential cognitive decline, a consent form was signed and placed in his chart. Decadron can decrease acute effects if swelling is responsible for them.  We talked about techniques and technology utilized at our institution that aim to minimize the risk of side effects.   I enjoyed meeting Mr. Sawatzky and his wife today. I look forward to participating in his care.  Simulation scheduled for Mon, Nov 23. Anticipate starting RT on Dec 2.  For HAs: keep Decadron at 2mg  BID.  For insomnia: Temazepam Rx'd today.  __________________________________________   Eppie Gibson, MD

## 2014-07-22 NOTE — Progress Notes (Signed)
Checked in new patient with no issues prior to seeing the dr. He has appt card and has not been traveling. °

## 2014-07-23 ENCOUNTER — Encounter: Payer: Self-pay | Admitting: Hematology

## 2014-07-23 MED ORDER — ONDANSETRON HCL 8 MG PO TABS: 8.0000 mg | ORAL_TABLET | Freq: Three times a day (TID) | ORAL | Status: DC | PRN

## 2014-07-23 MED ORDER — TEMOZOLOMIDE 180 MG PO CAPS: 75.0000 mg/m2/d | ORAL_CAPSULE | Freq: Every day | ORAL | Status: DC

## 2014-07-23 NOTE — Addendum Note (Signed)
Addended by: Truitt Merle on: 07/23/2014 11:42 AM   Modules accepted: Orders

## 2014-07-23 NOTE — Addendum Note (Signed)
Addended by: Truitt Merle on: 07/23/2014 11:37 AM   Modules accepted: Orders

## 2014-07-25 ENCOUNTER — Telehealth: Payer: Self-pay | Admitting: Hematology

## 2014-07-25 ENCOUNTER — Ambulatory Visit
Admission: RE | Admit: 2014-07-25 | Discharge: 2014-07-25 | Disposition: A | Discharge status: Home / Self Care | Payer: Commercial Managed Care - PPO | Source: Ambulatory Visit | Attending: Radiation Oncology | Admitting: Radiation Oncology

## 2014-07-25 ENCOUNTER — Telehealth: Payer: Self-pay | Admitting: *Deleted

## 2014-07-25 DIAGNOSIS — Z510 Encounter for antineoplastic radiation therapy: Secondary | ICD-10-CM | POA: Diagnosis not present

## 2014-07-25 DIAGNOSIS — C714 Malignant neoplasm of occipital lobe: Secondary | ICD-10-CM

## 2014-07-25 NOTE — Progress Notes (Signed)
  Radiation Oncology         [phone] ________________________________  Name: Jake [name] MRN: [id number]  Date: 07/25/2014  DOB: 04/12/1956  SIMULATION AND TREATMENT PLANNING NOTE  Outpatient  DIAGNOSIS:     ICD-9-CM ICD-10-CM   1. 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  Identity was confirmed.  All relevant records and images related to the planned course of therapy were reviewed.  The patient freely provided informed written consent to proceed with treatment after reviewing the details related to the planned course of therapy. The consent form was witnessed and verified by the simulation staff.    Then, the patient was set-up in a stable reproducible  supine position for radiation therapy.  CT images were obtained.  Surface markings were placed.  The CT images were loaded into the planning software.    TREATMENT PLANNING NOTE: Treatment planning then occurred.  The radiation prescription was entered and confirmed.    A total of 1 medically necessary complex treatment devices were fabricated and supervised by me: Aquaplast mask. I have requested : 3D Simulation (BUT, IMRT may be used if is better spares critical structures).  I have requested a DVH of the following structures: Brainstem, globes, optic nerves, target volumes.   The patient will receive 46 Gy in 23 fractions to the initial volumes with margin accounting for T2 FLAIR abnormality.  Then a boost of 14 Gray in 7 fractions to the T1 + contrast abnormality with margin.   Special Treatment Procedure Note: The patient will be receiving chemotherapy concurrently. Chemotherapy heightens the risk of side effects. I have considered this during the patient's treatment planning process and will monitor the patient accordingly for side effects on a weekly basis. Concurrent chemotherapy increases the complexity of this patient's treatment and  therefore this constitutes a special treatment procedure.   -----------------------------------  Eppie Gibson, MD

## 2014-07-25 NOTE — Telephone Encounter (Signed)
Received request for prior authorization from [facility] for Zofran 8mg  tab.  Gave request to care management.

## 2014-07-25 NOTE — Telephone Encounter (Signed)
returned pt call to move appt to later in the day. Pt confirms times.

## 2014-07-25 NOTE — Telephone Encounter (Signed)
LM to confirm appt for Nov/Dec. Mailed cal

## 2014-07-26 ENCOUNTER — Encounter: Payer: Self-pay | Admitting: Hematology

## 2014-07-26 ENCOUNTER — Other Ambulatory Visit: Payer: Self-pay | Admitting: *Deleted

## 2014-07-26 ENCOUNTER — Other Ambulatory Visit: Payer: Self-pay | Admitting: Hematology

## 2014-07-26 ENCOUNTER — Telehealth: Payer: Self-pay | Admitting: Hematology

## 2014-07-26 ENCOUNTER — Telehealth: Payer: Self-pay | Admitting: *Deleted

## 2014-07-26 NOTE — Progress Notes (Signed)
Received pa form via fax today for ondansetron, faxed back with expedited note

## 2014-07-26 NOTE — Telephone Encounter (Signed)
Confirm appt for 08/01/14.  °

## 2014-07-26 NOTE — Telephone Encounter (Signed)
Per Dr. Burr Medico -  Pt to take Temodar daily including weekend for now.  Spoke with Aaron Edelman, pharmacist @ [facility] and gave him above info.  Aaron Edelman stated pharmacy will contact pt when insurance issue is finalized.

## 2014-07-27 ENCOUNTER — Other Ambulatory Visit: Payer: Self-pay | Admitting: *Deleted

## 2014-07-27 ENCOUNTER — Encounter: Payer: Self-pay | Admitting: Oncology

## 2014-07-27 DIAGNOSIS — C719 Malignant neoplasm of brain, unspecified: Secondary | ICD-10-CM

## 2014-07-27 DIAGNOSIS — Z510 Encounter for antineoplastic radiation therapy: Secondary | ICD-10-CM | POA: Diagnosis not present

## 2014-07-27 MED ORDER — TEMOZOLOMIDE 180 MG PO CAPS: 75.0000 mg/m2/d | ORAL_CAPSULE | Freq: Every day | ORAL | Status: AC

## 2014-07-27 NOTE — Telephone Encounter (Signed)
[facility] Pharmacy needs script for temodar.  Discussed with Dr. Burr Medico.   Check with patient for specific pharmacy - [facility]. Pharmacy is fine with her.   Per patient- -"would prefer to have this filled at [facility].  Script re-escribed.

## 2014-07-27 NOTE — Progress Notes (Signed)
[employer] approved temodar from 07/25/14-07/26/15 and ondansetron from 07/25/14-07/26/15

## 2014-07-27 NOTE — Addendum Note (Signed)
Encounter addended by: Deirdre Evener, RN on: 07/27/2014 10:41 AM<BR>     Documentation filed: Charges VN

## 2014-07-29 ENCOUNTER — Encounter (HOSPITAL_COMMUNITY): Payer: Self-pay

## 2014-07-29 NOTE — Telephone Encounter (Signed)
RECEIVED A FAX FROM [facility] CONCERNING A PRIOR AUTHORIZATION FOR TEMOZOLOMIDE. THIS REQUEST WAS PLACED IN THE MANAGED CARE BIN.

## 2014-08-01 ENCOUNTER — Other Ambulatory Visit: Payer: Commercial Managed Care - PPO

## 2014-08-01 ENCOUNTER — Other Ambulatory Visit (HOSPITAL_BASED_OUTPATIENT_CLINIC_OR_DEPARTMENT_OTHER): Payer: Commercial Managed Care - PPO

## 2014-08-01 ENCOUNTER — Encounter: Payer: Self-pay | Admitting: Hematology

## 2014-08-01 ENCOUNTER — Encounter (HOSPITAL_COMMUNITY): Payer: Self-pay

## 2014-08-01 DIAGNOSIS — C713 Malignant neoplasm of parietal lobe: Secondary | ICD-10-CM

## 2014-08-01 DIAGNOSIS — C719 Malignant neoplasm of brain, unspecified: Secondary | ICD-10-CM

## 2014-08-01 DIAGNOSIS — C714 Malignant neoplasm of occipital lobe: Secondary | ICD-10-CM

## 2014-08-01 DIAGNOSIS — Z510 Encounter for antineoplastic radiation therapy: Secondary | ICD-10-CM | POA: Diagnosis not present

## 2014-08-01 LAB — CBC WITH DIFFERENTIAL/PLATELET
BASO%: 0.2 % (ref 0.0–2.0)
Basophils Absolute: 0 10*3/uL (ref 0.0–0.1)
EOS%: 0.1 % (ref 0.0–7.0)
Eosinophils Absolute: 0 10*3/uL (ref 0.0–0.5)
HCT: 41.4 % (ref 38.4–49.9)
HGB: 14.1 g/dL (ref 13.0–17.1)
LYMPH#: 1.2 10*3/uL (ref 0.9–3.3)
LYMPH%: 13.7 % — ABNORMAL LOW (ref 14.0–49.0)
MCH: 30.3 pg (ref 27.2–33.4)
MCHC: 34.1 g/dL (ref 32.0–36.0)
MCV: 89 fL (ref 79.3–98.0)
MONO#: 0.6 10*3/uL (ref 0.1–0.9)
MONO%: 7.1 % (ref 0.0–14.0)
NEUT#: 6.9 10*3/uL — ABNORMAL HIGH (ref 1.5–6.5)
NEUT%: 78.9 % — AB (ref 39.0–75.0)
Platelets: 158 10*3/uL (ref 140–400)
RBC: 4.65 10*6/uL (ref 4.20–5.82)
RDW: 15.1 % — AB (ref 11.0–14.6)
WBC: 8.7 10*3/uL (ref 4.0–10.3)

## 2014-08-01 LAB — COMPREHENSIVE METABOLIC PANEL (CC13)
ALT: 41 U/L (ref 0–55)
AST: 18 U/L (ref 5–34)
Albumin: 3.3 g/dL — ABNORMAL LOW (ref 3.5–5.0)
Alkaline Phosphatase: 53 U/L (ref 40–150)
Anion Gap: 8 mEq/L (ref 3–11)
BILIRUBIN TOTAL: 0.55 mg/dL (ref 0.20–1.20)
BUN: 25.4 mg/dL (ref 7.0–26.0)
CALCIUM: 9.1 mg/dL (ref 8.4–10.4)
CHLORIDE: 101 meq/L (ref 98–109)
CO2: 25 mEq/L (ref 22–29)
CREATININE: 0.9 mg/dL (ref 0.7–1.3)
Glucose: 111 mg/dl (ref 70–140)
Potassium: 4.2 mEq/L (ref 3.5–5.1)
Sodium: 134 mEq/L — ABNORMAL LOW (ref 136–145)
Total Protein: 5.7 g/dL — ABNORMAL LOW (ref 6.4–8.3)

## 2014-08-01 LAB — TECHNOLOGIST REVIEW

## 2014-08-01 NOTE — Progress Notes (Signed)
No episodes or date for chemo as of today. Notes say possible start radiation 08/01/14

## 2014-08-03 ENCOUNTER — Ambulatory Visit
Admission: RE | Admit: 2014-08-03 | Discharge: 2014-08-03 | Disposition: A | Discharge status: Home / Self Care | Payer: Commercial Managed Care - PPO | Source: Ambulatory Visit | Attending: Radiation Oncology | Admitting: Radiation Oncology

## 2014-08-03 ENCOUNTER — Encounter: Payer: Self-pay | Admitting: Radiation Oncology

## 2014-08-03 DIAGNOSIS — C714 Malignant neoplasm of occipital lobe: Secondary | ICD-10-CM

## 2014-08-03 DIAGNOSIS — Z510 Encounter for antineoplastic radiation therapy: Secondary | ICD-10-CM | POA: Diagnosis not present

## 2014-08-04 ENCOUNTER — Telehealth: Payer: Self-pay | Admitting: Hematology

## 2014-08-04 ENCOUNTER — Encounter: Payer: Self-pay | Admitting: Hematology

## 2014-08-04 ENCOUNTER — Ambulatory Visit: Payer: Commercial Managed Care - PPO | Admitting: Hematology

## 2014-08-04 ENCOUNTER — Ambulatory Visit (HOSPITAL_BASED_OUTPATIENT_CLINIC_OR_DEPARTMENT_OTHER): Payer: Commercial Managed Care - PPO | Admitting: Hematology

## 2014-08-04 ENCOUNTER — Other Ambulatory Visit: Payer: Self-pay | Admitting: Hematology

## 2014-08-04 ENCOUNTER — Ambulatory Visit
Admission: RE | Admit: 2014-08-04 | Discharge: 2014-08-04 | Disposition: A | Discharge status: Home / Self Care | Payer: Commercial Managed Care - PPO | Source: Ambulatory Visit | Attending: Radiation Oncology | Admitting: Radiation Oncology

## 2014-08-04 VITALS — BP 130/74 | HR 106 | Temp 98.1°F | Resp 18 | Ht 71.0 in | Wt 244.5 lb

## 2014-08-04 DIAGNOSIS — Z510 Encounter for antineoplastic radiation therapy: Secondary | ICD-10-CM | POA: Diagnosis not present

## 2014-08-04 DIAGNOSIS — C713 Malignant neoplasm of parietal lobe: Secondary | ICD-10-CM

## 2014-08-04 DIAGNOSIS — C719 Malignant neoplasm of brain, unspecified: Secondary | ICD-10-CM

## 2014-08-04 MED ORDER — SULFAMETHOXAZOLE-TRIMETHOPRIM 800-160 MG PO TABS: 1.0000 | ORAL_TABLET | ORAL | Status: DC

## 2014-08-04 MED ORDER — ESOMEPRAZOLE MAGNESIUM 20 MG PO CPDR: 20.0000 mg | DELAYED_RELEASE_CAPSULE | Freq: Every day | ORAL | Status: DC

## 2014-08-04 NOTE — Telephone Encounter (Signed)
Pt will confirm all appt on MyChart once he looks at his cal.

## 2014-08-04 NOTE — Progress Notes (Signed)
[facility] NOTE  Patient Care Team: [name], MD as PCP - General (Family Medicine) Elaina Hoops, MD as Consulting Physician (Neurosurgery) Eppie Gibson, MD as Attending Physician (Radiation Oncology) Truitt Merle, MD as Consulting Physician (Hematology)     Glioblastoma of occipital lobe   07/02/2014 Imaging Brain MRI: Two adjacent enhancing mass lesions in the right parietal lobe with central necrosis and extensive surrounding edema and mass effect.  7 mm midline shift to the left.  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   CT CAP(-)   07/07/2014 Surgery Subtotal right parietal tumors resection      CURRENT THERAPY: Concurrent chemoRT, Temodar 75mg /m2 (150mg ) daily started on 08/03/2014  CHIEF COMPLAINTS: follow up   HISTORY OF INITIAL PRESENTATION;   He initially presented was headaches and sinus congestion in mid October 2015. He was seen by his primary care physician and was treated for sinus infection. He subsequently developed left sided visual deficit, left arm and leg weakness and unsteady gait. He was sent to emergency room by his primary care physician on 07/02/2014. CT  And MRI of brain reviewed to add adjacent enhancing mass lesions in the right parietal lobe with central necrosis and extensive surrounding edema and mass effect. He was admitted and underwent right craniotomy  For resection of the 2 brain masses by Dr. Lonell Grandchild. Postsurgical brain MRI revealed a 1.3 cm area of enhancement at the anterior margin of surgical resection cavity, likely surgical change. His case was reviewed in the tumor board and it was felt he had subtotal resection.  INTERVAL HISTROY: Gurdeep returns for follow-up. He started concurrent radiation and Temodar yesterday. He took Zofran before Temodar, no nausea vomiting or any other noticeable side effects. He is doing well in general, has very good appetite under the influence of dexamethasone, and gained 5 pounds  in the past 2 weeks. He denies any headaches, or any other new symptoms.   MEDICAL HISTORY:  Past Medical History  Diagnosis Date  . Thyroid disease   . Hypertension   . Allergy   . Arthritis   .    Marland Kitchen Kidney stone     SURGICAL HISTORY: Past Surgical History  Procedure Laterality Date  . Knee arthroscopy w/ meniscal repair    . Treatment fistula anal    . 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 Marital Status: Married    Spouse Name: N/A    Number of Children: 5 children, age of 59-21  . Years of Education: N/A   Occupational History  . 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    Social History Main Topics  . Smoking status: Current Every Day Smoker -- 1.00 packs/day for 45 years    Types: Cigarettes  . Smokeless tobacco: Never Used  . Alcohol Use: Yes     Comment: 2-3 beers per/day   . Drug Use: No  . Sexual Activity: Not on file     FAMILY HISTORY: Family History  Problem Relation Age of Onset  . Hypertension Mother   . Diabetes Father   . Glaucoma Father   . Diabetes Brother   . Diabetes Brother   . Colon cancer Neg Hx   Paternal ancle had prostate cancer. No other family  history of malignancy.  ALLERGIES:  has No Known Allergies.   MEDICATIONS:  Current Outpatient Prescriptions  Medication Sig Dispense Refill  . cyclobenzaprine (FLEXERIL) 10 MG tablet   0  . dexamethasone (DECADRON) 4 MG tablet Take 1 tablet (4 mg total) by mouth 3 (three) times daily. (Patient taking differently: Take 2 mg by mouth 2 (two) times daily. ) 50 tablet 0  . HYDROcodone-acetaminophen (NORCO/VICODIN) 5-325 MG per tablet Take 1 tablet by mouth every 4 (four) hours as needed for moderate pain. 60 tablet 0  . levothyroxine (SYNTHROID, LEVOTHROID) 50 MCG tablet Take 50 mcg  by mouth daily before breakfast. TAKE ONE TABLET BY MOUTH EVERY DAY    . lisinopril (PRINIVIL,ZESTRIL) 40 MG tablet Take 40 mg by mouth daily. TAKE ONE TABLET BY MOUTH EVERY DAY    . magnesium 30 MG tablet Take 30 mg by mouth 2 (two) times daily.    . Multiple Vitamins-Minerals (MULTIVITAMIN WITH MINERALS) tablet Take 1 tablet by mouth daily.    . ondansetron (ZOFRAN) 8 MG tablet Take 1 tablet (8 mg total) by mouth every 8 (eight) hours as needed for nausea or vomiting. 30 tablet 4  . Probiotic Product (PROBIOTIC DAILY PO) Take by mouth.    . temazepam (RESTORIL) 15 MG capsule Take 1 capsule (15 mg total) by mouth at bedtime as needed for sleep. 30 capsule 0  . temozolomide (TEMODAR) 180 MG capsule Take 1 capsule (180 mg total) by mouth daily. May take on an empty stomach or at bedtime to decrease nausea & vomiting. 42 capsule 0  . verapamil (COVERA HS) 180 MG (CO) 24 hr tablet Take 180 mg by mouth at bedtime.    Marland Kitchen b complex vitamins tablet Take 1 tablet by mouth daily.     No current facility-administered medications for this visit.    REVIEW OF SYSTEMS:   Constitutional: Denies fevers, chills or abnormal night sweats Eyes: Denies blurriness of vision, double vision or watery eyes Ears, nose, mouth, throat, and face: Denies mucositis or sore throat Respiratory: Denies cough, dyspnea or wheezes Cardiovascular: Denies palpitation, chest discomfort or lower extremity swelling Gastrointestinal:  Denies nausea, heartburn or change in bowel habits Skin: Denies abnormal skin rashes Lymphatics: Denies new lymphadenopathy or easy bruising Neurological:positive for mild headaches, and left side vision deficits. His left-sided weakness has nearly resolved. Denies numbness, tingling or new weaknesses Behavioral/Psych: Mood is stable, no new changes  All other systems were reviewed with the patient and are negative.  PHYSICAL EXAMINATION: ECOG PERFORMANCE STATUS: 1 - Symptomatic but completely  ambulatory  KPS: 90%  Filed Vitals:   08/04/14 1353  BP: 130/74  Pulse: 106  Temp: 98.1 F (36.7 C)  Resp: 18   Filed Weights   08/04/14 1353  Weight: 244 lb 8 oz (110.904 kg)    GENERAL:alert, no distress and comfortable SKIN: skin color, texture, turgor are normal, no rashes or significant lesions HEAD: right-sided posterior craniotomy surgical wound is healing well, no surrounding edema, skin redness or discharge EYES: normal, conjunctiva are pink and non-injected, sclera clear OROPHARYNX:no exudate, no erythema and lips, buccal mucosa, and tongue normal  NECK: supple, thyroid normal size, non-tender, without nodularity LYMPH:  no palpable lymphadenopathy in the cervical, axillary or inguinal LUNGS: clear to auscultation and percussion with normal breathing effort HEART: regular rate & rhythm and no murmurs and no lower extremity edema ABDOMEN:abdomen soft, non-tender and normal bowel sounds Musculoskeletal:no cyanosis of digits and no clubbing  PSYCH: alert & oriented x  3 with fluent speech NEURO: cranial nerve 2-12 are intact. Left side vision deficits is pressure in the left upper and mid quadrant. Motor and sensitivity are normal and symmetric on all extremities. Speech is fluent, coordination is grossly intact. Ambulates independently and Lutricia Feil sign negative.  LABORATORY DATA:  I have reviewed the data as listed Lab Results  Component Value Date   WBC 8.7 08/01/2014   HGB 14.1 08/01/2014   HCT 41.4 08/01/2014   MCV 89.0 08/01/2014   PLT 158 08/01/2014    Recent Labs  01/25/14 1015  07/02/14 1518 07/02/14 1548 07/08/14 0644 08/01/14 1140  NA 132*  --  136* 136* 134* 134*  K 4.4  --  4.0 4.1 4.7 4.2  CL 99  --  102 100 99  --   CO2 26  --   --  24 23 25   GLUCOSE 102*  --  102* 96 100* 111  BUN 12  --  12 12 26* 25.4  CREATININE 0.86  < > 0.80 0.85 0.75 0.9  CALCIUM 9.6  --   --  9.3 8.4 9.1  GFRNONAA  --   --   --  >90 >90  --   GFRAA  --   --   --   >90 >90  --   PROT 6.7  --   --  6.9  --  5.7*  ALBUMIN 4.2  --   --  3.7  --  3.3*  AST 26  --   --  22  --  18  ALT 21  --   --  20  --  41  ALKPHOS 62  --   --  57  --  53  BILITOT 0.7  --   --  0.5  --  0.55  < > = values in this interval not displayed.  Surgical path 07/07/2014  Diagnosis 1. Brain, biopsy - GLIOBLASTOMA MULTIFORME, WHO GRADE IV/IV. - SEE ONCOLOGY TABLE BELOW. 2. Brain, for tumor resection, higher right parietal - GLIOBLASTOMA MULTIFORME, WHO GRADE IV/IV. - SEE ONCOLOGY TABLE BELOW. 3. Brain, for tumor resection, lower right occipital - GLIOBLASTOMA MULTIFORME, WHO GRADE IV/IV   Microscopic Comment 1. -3. ONCOLOGY TABLE - BRAIN AND SPINAL CORD 1. Procedure: Resection x2 2. Tumor site, including laterality: Right parietal and right occipital 3. Maximum tumor size (cm): At least 1.8 cm 4. Histologic type: Glioblastoma multiforme 5. Grade: WHO Grade IV/IV 6. Margins (if applicable): Can not be assessed 7. Ancillary studies: Can be performed upon clinician request.  RADIOGRAPHIC STUDIES: I have personally reviewed the radiological images as listed and agreed with the findings in the report.  Mr [name] Wo Contrast 07/08/2014    IMPRESSION:  1. Postoperative changes from interval right occipital craniotomy with resection of right parieto-occipital mass lesions.  2. 1.3 x 0.5 x 0.5 cm area of enhancement at the anterior margin of the more superior resection cavity in the right parieto-occipital region. It is unclear whether this reflects residual tumor or possibly normal enhancement of the choroid plexus in the adjacent compressed occipital horn of the right lateral ventricle. Attention on follow-up recommended.  3. Fairly linear enhancement about the more inferior resection cavity without definite evidence of residual tumor.  4. Small amount of associated postoperative infarct at the medial aspect of the more superior resection site.  5. Stable 6 mm focus of  restricted diffusion in the left frontal lobe, compatible with an acute or subacute infarct.  6. Slightly improved vasogenic  edema within the right parieto-occipital region with persistent 7 mm of right-to-left midline shift.     Electronically Signed   By: Jeannine Boga M.D.   On: 07/08/2014 23:45   Mr Brain W PF Contrast  IMPRESSION:  Two adjacent enhancing mass lesions in the right parietal lobe with central necrosis and extensive surrounding edema and mass effect. Hyperintensity extends into the splenium of the corpus callosum. 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 Metastatic disease is a less likely consideration. 7 mm midline shift to the left.  Subcentimeter focus of restricted diffusion in the left frontal lobe likely an area of acute or subacute infarction.  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      ASSESSMENT & PLAN:  Mr. Fontanilla is a 58 year old gentleman with newly diagnosed right parietal GBM, status post subtotal resection. He has recovered very well, except mild headaches and left-sided vision deficits.  1. GBM, s/p subtotal resection, on concurrent chemoRT from 08/04/14  -doing well, continue  -lab CBC weekly, CMP every other week -bactrim DS 1 tab every M, W, F for PCP prophylaxis, prescription called in  -follow up with me every 2 weeks. -We also discussed the side effects of steroids, I suggest him to take Nexium daily when he is on steroids, vitamin the vitamin D and calcium, and a regular exercise.   All questions were answered. The patient knows to call the clinic with any problems, questions or concerns. I spent 15 minutes counseling the patient face to face. The total time spent in the appointment was 20 minutes and more than 50% was on counseling.     Truitt Merle, MD 08/04/2014 2:35 PM

## 2014-08-05 ENCOUNTER — Ambulatory Visit
Admission: RE | Admit: 2014-08-05 | Discharge: 2014-08-05 | Disposition: A | Discharge status: Home / Self Care | Payer: Commercial Managed Care - PPO | Source: Ambulatory Visit | Attending: Radiation Oncology | Admitting: Radiation Oncology

## 2014-08-05 ENCOUNTER — Telehealth: Payer: Self-pay | Admitting: *Deleted

## 2014-08-05 ENCOUNTER — Encounter: Payer: Self-pay | Admitting: *Deleted

## 2014-08-05 DIAGNOSIS — Z510 Encounter for antineoplastic radiation therapy: Secondary | ICD-10-CM | POA: Diagnosis not present

## 2014-08-05 NOTE — Telephone Encounter (Signed)
Discussed new scripts, nexium & Bactrim with pt & need to p/u at his pharmacy.

## 2014-08-05 NOTE — Progress Notes (Signed)
Education today at ~ 11:40am regarding side-effect management during radiation therapy to his brain.  Reviewed skin/hair care, tenderness of scalp with redness, fatigue and potential for headaches during treatment.  Currently on Decadron and educated on surveillance for oral thrush.  Encouraged to report any vision changes, increased headaches, nausea/vomiting, changes in fine motor movement or gait changes.  He and spouse stated understanding of potential symptoms, as well as, the need to report changes in status.  Biafine to apply BID to scalp will be given on tomorrow.  Radiation therapy and You Booklet given with appropriate pages marked.  Teach back.

## 2014-08-08 ENCOUNTER — Ambulatory Visit
Admission: RE | Admit: 2014-08-08 | Discharge: 2014-08-08 | Disposition: A | Discharge status: Home / Self Care | Payer: Commercial Managed Care - PPO | Source: Ambulatory Visit | Attending: Radiation Oncology | Admitting: Radiation Oncology

## 2014-08-08 ENCOUNTER — Encounter: Payer: Self-pay | Admitting: Radiation Oncology

## 2014-08-08 VITALS — BP 129/81 | HR 80 | Temp 98.1°F | Ht 71.0 in | Wt 244.5 lb

## 2014-08-08 DIAGNOSIS — C714 Malignant neoplasm of occipital lobe: Secondary | ICD-10-CM

## 2014-08-08 DIAGNOSIS — Z510 Encounter for antineoplastic radiation therapy: Secondary | ICD-10-CM | POA: Diagnosis not present

## 2014-08-08 MED ORDER — HYDROCODONE-ACETAMINOPHEN 5-325 MG PO TABS: 1.0000 | ORAL_TABLET | ORAL | Status: DC | PRN

## 2014-08-08 MED ORDER — DEXAMETHASONE 2 MG PO TABS: 2.0000 mg | ORAL_TABLET | Freq: Two times a day (BID) | ORAL | Status: DC

## 2014-08-08 MED ORDER — ESZOPICLONE 2 MG PO TABS: 2.0000 mg | ORAL_TABLET | Freq: Every evening | ORAL | Status: DC | PRN

## 2014-08-08 NOTE — Progress Notes (Addendum)
Mr. Marengo has received 4 fractions to his brain. He denies any pain nor neurological changes today and continues on Temodar. . Today his main concern is aching and pain in his knees and ankles associated with cramps, which were relieved with Hydrocodone this am.   Currently taking Decadron 2 mg po BID.

## 2014-08-08 NOTE — Progress Notes (Signed)
Weekly Management Note:  outpatient    ICD-9-CM ICD-10-CM   1. 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  CBCT/MVCT images/Port film x-rays were reviewed.  The chart was checked. Mr. Primm has received 4 fractions to his brain. He denies any pain nor neurological changes today and continues on Temodar. . Today his main concern is aching and pain in his knees and ankle joints associated with cramps, which were relieved with Hydrocodone and flexeril.   Currently taking Decadron 2 mg po BID.  Restoril 30mg  not helping sleep. No calf pain or excessive swelling in extremities  Physical Findings:  height is 5\' 11"  (1.803 m) and weight is 244 lb 8 oz (110.904 kg). His temperature is 98.1 F (36.7 C). His blood pressure is 129/81 and his pulse is 80.   No oral thrush, no calf pain to palpation, no cords. Mild LE edema. Scalp wound healed.  CBC    Component Value Date/Time   WBC 8.7 08/01/2014 1139   WBC 6.4 07/02/2014 1548   WBC 5.0 01/31/2013 1257   RBC 4.65 08/01/2014 1139   RBC 5.07 07/02/2014 1548   RBC 4.46* 01/31/2013 1257   HGB 14.1 08/01/2014 1139   HGB 15.2 07/02/2014 1548   HGB 13.1* 01/31/2013 1257   HCT 41.4 08/01/2014 1139   HCT 44.5 07/02/2014 1548   HCT 42.1* 01/31/2013 1257   PLT 158 08/01/2014 1139   PLT 198 07/02/2014 1548   MCV 89.0 08/01/2014 1139   MCV 87.8 07/02/2014 1548   MCV 94.3 01/31/2013 1257   MCH 30.3 08/01/2014 1139   MCH 30.0 07/02/2014 1548   MCH 29.4 01/31/2013 1257   MCHC 34.1 08/01/2014 1139   MCHC 34.2 07/02/2014 1548   MCHC 31.1* 01/31/2013 1257   RDW 15.1* 08/01/2014 1139   RDW 13.5 07/02/2014 1548   LYMPHSABS 1.2 08/01/2014 1139   LYMPHSABS 1.5 07/02/2014 1548   MONOABS  0.6 08/01/2014 1139   MONOABS 0.5 07/02/2014 1548   EOSABS 0.0 08/01/2014 1139   EOSABS 0.0 07/02/2014 1548   BASOSABS 0.0 08/01/2014 1139   BASOSABS 0.0 07/02/2014 1548     CMP     Component Value Date/Time   NA 134* 08/01/2014 1140   NA 134* 07/08/2014 0644   K 4.2 08/01/2014 1140   K 4.7 07/08/2014 0644   CL 99 07/08/2014 0644   CO2 25 08/01/2014 1140   CO2 23 07/08/2014 0644   GLUCOSE 111 08/01/2014 1140   GLUCOSE 100* 07/08/2014 0644   BUN 25.4 08/01/2014 1140   BUN 26* 07/08/2014 0644   CREATININE 0.9 08/01/2014 1140   CREATININE 0.75 07/08/2014 0644   CREATININE 0.86 01/25/2014 1015   CALCIUM 9.1 08/01/2014 1140   CALCIUM 8.4 07/08/2014 0644   PROT 5.7* 08/01/2014 1140   PROT 6.9 07/02/2014 1548   ALBUMIN 3.3* 08/01/2014 1140   ALBUMIN 3.7 07/02/2014 1548   AST 18 08/01/2014 1140   AST 22 07/02/2014 1548   ALT 41 08/01/2014 1140   ALT 20 07/02/2014 1548   ALKPHOS 53 08/01/2014 1140   ALKPHOS 57 07/02/2014 1548   BILITOT 0.55 08/01/2014 1140   BILITOT 0.5 07/02/2014 1548   GFRNONAA >90 07/08/2014 1275  GFRAA >90 07/08/2014 0644     Impression:  The patient is tolerating radiotherapy.   Plan:  Continue radiotherapy as planned. Refills as above.  Change restoril to lunesta for sleep.  Pt will use Flexeril for joint aches/ cramps. DVT very unlikely based on history and exam.  -----------------------------------  Eppie Gibson, MD

## 2014-08-09 ENCOUNTER — Telehealth: Payer: Self-pay | Admitting: *Deleted

## 2014-08-09 ENCOUNTER — Ambulatory Visit
Admission: RE | Admit: 2014-08-09 | Discharge: 2014-08-09 | Disposition: A | Discharge status: Home / Self Care | Payer: Commercial Managed Care - PPO | Source: Ambulatory Visit | Attending: Radiation Oncology | Admitting: Radiation Oncology

## 2014-08-09 DIAGNOSIS — Z510 Encounter for antineoplastic radiation therapy: Secondary | ICD-10-CM | POA: Diagnosis not present

## 2014-08-09 NOTE — Telephone Encounter (Signed)
Received call from pt asking how he was supposed to be taking his temodar, ? Daily or just days of chemo?.  Returned call & informed to take daily even weekends. Also received call from [facility] pt pharmacy to verify instructions since pt had called them also & informed them that he had not taken his dosage over the weekend.  Dr Burr Medico informed & correct instructions given to pharmacy which is daily.

## 2014-08-10 ENCOUNTER — Ambulatory Visit
Admission: RE | Admit: 2014-08-10 | Discharge: 2014-08-10 | Disposition: A | Discharge status: Home / Self Care | Payer: Commercial Managed Care - PPO | Source: Ambulatory Visit | Attending: Radiation Oncology | Admitting: Radiation Oncology

## 2014-08-10 DIAGNOSIS — Z510 Encounter for antineoplastic radiation therapy: Secondary | ICD-10-CM | POA: Diagnosis not present

## 2014-08-11 ENCOUNTER — Other Ambulatory Visit (HOSPITAL_BASED_OUTPATIENT_CLINIC_OR_DEPARTMENT_OTHER): Payer: Commercial Managed Care - PPO

## 2014-08-11 ENCOUNTER — Ambulatory Visit
Admission: RE | Admit: 2014-08-11 | Discharge: 2014-08-11 | Disposition: A | Discharge status: Home / Self Care | Payer: Commercial Managed Care - PPO | Source: Ambulatory Visit | Attending: Radiation Oncology | Admitting: Radiation Oncology

## 2014-08-11 DIAGNOSIS — C713 Malignant neoplasm of parietal lobe: Secondary | ICD-10-CM

## 2014-08-11 DIAGNOSIS — Z510 Encounter for antineoplastic radiation therapy: Secondary | ICD-10-CM | POA: Diagnosis not present

## 2014-08-11 DIAGNOSIS — C719 Malignant neoplasm of brain, unspecified: Secondary | ICD-10-CM

## 2014-08-11 LAB — COMPREHENSIVE METABOLIC PANEL (CC13)
ALT: 44 U/L (ref 0–55)
AST: 21 U/L (ref 5–34)
Albumin: 3.3 g/dL — ABNORMAL LOW (ref 3.5–5.0)
Alkaline Phosphatase: 53 U/L (ref 40–150)
Anion Gap: 8 mEq/L (ref 3–11)
BUN: 23.2 mg/dL (ref 7.0–26.0)
CALCIUM: 9.3 mg/dL (ref 8.4–10.4)
CO2: 26 meq/L (ref 22–29)
Chloride: 100 mEq/L (ref 98–109)
Creatinine: 0.8 mg/dL (ref 0.7–1.3)
EGFR: >90 mL/min/{1.73_m2} (ref >90)
GLUCOSE: 82 mg/dL (ref 70–140)
POTASSIUM: 5.1 meq/L (ref 3.5–5.1)
Sodium: 135 mEq/L — ABNORMAL LOW (ref 136–145)
Total Bilirubin: 0.53 mg/dL (ref 0.20–1.20)
Total Protein: 5.9 g/dL — ABNORMAL LOW (ref 6.4–8.3)

## 2014-08-11 LAB — CBC WITH DIFFERENTIAL/PLATELET
BASO%: 0.2 % (ref 0.0–2.0)
BASOS ABS: 0 10*3/uL (ref 0.0–0.1)
EOS ABS: 0 10*3/uL (ref 0.0–0.5)
EOS%: 0.1 % (ref 0.0–7.0)
HEMATOCRIT: 40.5 % (ref 38.4–49.9)
HGB: 13.5 g/dL (ref 13.0–17.1)
LYMPH%: 9.4 % — AB (ref 14.0–49.0)
MCH: 30 pg (ref 27.2–33.4)
MCHC: 33.3 g/dL (ref 32.0–36.0)
MCV: 90 fL (ref 79.3–98.0)
MONO#: 0.6 10*3/uL (ref 0.1–0.9)
MONO%: 6.3 % (ref 0.0–14.0)
NEUT%: 84 % — AB (ref 39.0–75.0)
NEUTROS ABS: 7.3 10*3/uL — AB (ref 1.5–6.5)
PLATELETS: 145 10*3/uL (ref 140–400)
RBC: 4.5 10*6/uL (ref 4.20–5.82)
RDW: 15.8 % — ABNORMAL HIGH (ref 11.0–14.6)
WBC: 8.7 10*3/uL (ref 4.0–10.3)
lymph#: 0.8 10*3/uL — ABNORMAL LOW (ref 0.9–3.3)

## 2014-08-11 LAB — TECHNOLOGIST REVIEW

## 2014-08-12 ENCOUNTER — Ambulatory Visit
Admission: RE | Admit: 2014-08-12 | Discharge: 2014-08-12 | Disposition: A | Discharge status: Home / Self Care | Payer: Commercial Managed Care - PPO | Source: Ambulatory Visit | Attending: Radiation Oncology | Admitting: Radiation Oncology

## 2014-08-12 DIAGNOSIS — Z510 Encounter for antineoplastic radiation therapy: Secondary | ICD-10-CM | POA: Diagnosis not present

## 2014-08-15 ENCOUNTER — Ambulatory Visit
Admission: RE | Admit: 2014-08-15 | Discharge: 2014-08-15 | Disposition: A | Discharge status: Home / Self Care | Payer: Commercial Managed Care - PPO | Source: Ambulatory Visit | Attending: Radiation Oncology | Admitting: Radiation Oncology

## 2014-08-15 ENCOUNTER — Encounter: Payer: Self-pay | Admitting: Radiation Oncology

## 2014-08-15 VITALS — BP 135/75 | HR 84 | Temp 97.5°F | Ht 71.0 in | Wt 246.4 lb

## 2014-08-15 DIAGNOSIS — Z510 Encounter for antineoplastic radiation therapy: Secondary | ICD-10-CM | POA: Diagnosis not present

## 2014-08-15 DIAGNOSIS — C714 Malignant neoplasm of occipital lobe: Secondary | ICD-10-CM

## 2014-08-15 NOTE — Progress Notes (Signed)
   Weekly Management Note:  outpatient    ICD-9-CM ICD-10-CM   1. 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  CBCT/MVCT images/Port film x-rays were reviewed.  The chart was checked. Doing well. No new neurologic complaints. Decadron 2mg  BID  Physical Findings:  height is 5\' 11"  (1.803 m) and weight is 246 lb 6.4 oz (111.766 kg). His temperature is 97.5 F (36.4 C). His blood pressure is 135/75 and his pulse is 84.  no thrush. Ambulatory.   CBC    Component Value Date/Time   WBC 8.7 08/11/2014 1045   WBC 6.4 07/02/2014 1548   WBC 5.0 01/31/2013 1257   RBC 4.50 08/11/2014 1045   RBC 5.07 07/02/2014 1548   RBC 4.46* 01/31/2013 1257   HGB 13.5 08/11/2014 1045   HGB 15.2 07/02/2014 1548   HGB 13.1* 01/31/2013 1257   HCT 40.5 08/11/2014 1045   HCT 44.5 07/02/2014 1548   HCT 42.1* 01/31/2013 1257   PLT 145 08/11/2014 1045   PLT 198 07/02/2014 1548   MCV 90.0 08/11/2014 1045   MCV 87.8 07/02/2014 1548   MCV 94.3 01/31/2013 1257   MCH 30.0 08/11/2014 1045   MCH 30.0 07/02/2014 1548   MCH 29.4 01/31/2013 1257   MCHC 33.3 08/11/2014 1045   MCHC 34.2 07/02/2014 1548   MCHC 31.1* 01/31/2013 1257   RDW 15.8* 08/11/2014 1045   RDW 13.5 07/02/2014 1548   LYMPHSABS 0.8* 08/11/2014 1045   LYMPHSABS 1.5 07/02/2014 1548   MONOABS 0.6 08/11/2014 1045   MONOABS 0.5 07/02/2014 1548   EOSABS 0.0 08/11/2014 1045   EOSABS 0.0 07/02/2014 1548   BASOSABS 0.0 08/11/2014 1045   BASOSABS 0.0 07/02/2014 1548     CMP     Component Value Date/Time   NA 135* 08/11/2014 1046   NA 134* 07/08/2014 0644   K 5.1 08/11/2014 1046   K 4.7 07/08/2014 0644   CL 99 07/08/2014 0644   CO2 26 08/11/2014 1046   CO2 23 07/08/2014 0644   GLUCOSE 82 08/11/2014 1046   GLUCOSE 100* 07/08/2014 0644   BUN 23.2 08/11/2014 1046   BUN 26* 07/08/2014 0644   CREATININE 0.8 08/11/2014 1046    CREATININE 0.75 07/08/2014 0644   CREATININE 0.86 01/25/2014 1015   CALCIUM 9.3 08/11/2014 1046   CALCIUM 8.4 07/08/2014 0644   PROT 5.9* 08/11/2014 1046   PROT 6.9 07/02/2014 1548   ALBUMIN 3.3* 08/11/2014 1046   ALBUMIN 3.7 07/02/2014 1548   AST 21 08/11/2014 1046   AST 22 07/02/2014 1548   ALT 44 08/11/2014 1046   ALT 20 07/02/2014 1548   ALKPHOS 53 08/11/2014 1046   ALKPHOS 57 07/02/2014 1548   BILITOT 0.53 08/11/2014 1046   BILITOT 0.5 07/02/2014 1548   GFRNONAA >90 07/08/2014 0644   GFRAA >90 07/08/2014 2637     Impression:  The patient is tolerating radiotherapy.   Plan:  Continue radiotherapy as planned.    -----------------------------------  Eppie Gibson, MD

## 2014-08-15 NOTE — Progress Notes (Signed)
Jake [name] has received 9 fractions to the brain. He denies any headaches, ataxia or N/V at this time.  He reports continued blurred vision and lack of peripheral vision in the left eye.   Whitish areas noted on the posterior gumline.

## 2014-08-16 ENCOUNTER — Ambulatory Visit
Admission: RE | Admit: 2014-08-16 | Discharge: 2014-08-16 | Disposition: A | Discharge status: Home / Self Care | Payer: Commercial Managed Care - PPO | Source: Ambulatory Visit | Attending: Radiation Oncology | Admitting: Radiation Oncology

## 2014-08-16 DIAGNOSIS — Z510 Encounter for antineoplastic radiation therapy: Secondary | ICD-10-CM | POA: Diagnosis not present

## 2014-08-17 ENCOUNTER — Ambulatory Visit
Admission: RE | Admit: 2014-08-17 | Discharge: 2014-08-17 | Disposition: A | Discharge status: Home / Self Care | Payer: Commercial Managed Care - PPO | Source: Ambulatory Visit | Attending: Radiation Oncology | Admitting: Radiation Oncology

## 2014-08-17 DIAGNOSIS — Z510 Encounter for antineoplastic radiation therapy: Secondary | ICD-10-CM | POA: Diagnosis not present

## 2014-08-18 ENCOUNTER — Ambulatory Visit
Admission: RE | Admit: 2014-08-18 | Discharge: 2014-08-18 | Disposition: A | Discharge status: Home / Self Care | Payer: Commercial Managed Care - PPO | Source: Ambulatory Visit | Attending: Radiation Oncology | Admitting: Radiation Oncology

## 2014-08-18 ENCOUNTER — Encounter: Payer: Self-pay | Admitting: Hematology

## 2014-08-18 ENCOUNTER — Other Ambulatory Visit (HOSPITAL_BASED_OUTPATIENT_CLINIC_OR_DEPARTMENT_OTHER): Payer: Commercial Managed Care - PPO

## 2014-08-18 ENCOUNTER — Ambulatory Visit (HOSPITAL_BASED_OUTPATIENT_CLINIC_OR_DEPARTMENT_OTHER): Payer: Commercial Managed Care - PPO | Admitting: Hematology

## 2014-08-18 VITALS — BP 152/74 | HR 101 | Temp 98.1°F | Resp 19 | Ht 71.0 in | Wt 249.6 lb

## 2014-08-18 DIAGNOSIS — C719 Malignant neoplasm of brain, unspecified: Secondary | ICD-10-CM

## 2014-08-18 DIAGNOSIS — C713 Malignant neoplasm of parietal lobe: Secondary | ICD-10-CM

## 2014-08-18 DIAGNOSIS — Z510 Encounter for antineoplastic radiation therapy: Secondary | ICD-10-CM | POA: Diagnosis not present

## 2014-08-18 LAB — COMPREHENSIVE METABOLIC PANEL (CC13)
ALBUMIN: 3.2 g/dL — AB (ref 3.5–5.0)
ALK PHOS: 80 U/L (ref 40–150)
ALT: 58 U/L — ABNORMAL HIGH (ref 0–55)
AST: 25 U/L (ref 5–34)
Anion Gap: 11 mEq/L (ref 3–11)
BUN: 24.2 mg/dL (ref 7.0–26.0)
CO2: 24 mEq/L (ref 22–29)
Calcium: 9 mg/dL (ref 8.4–10.4)
Chloride: 101 mEq/L (ref 98–109)
Creatinine: 0.9 mg/dL (ref 0.7–1.3)
EGFR: 88 mL/min/{1.73_m2} — ABNORMAL LOW (ref >90)
Glucose: 105 mg/dl (ref 70–140)
POTASSIUM: 4.5 meq/L (ref 3.5–5.1)
SODIUM: 135 meq/L — AB (ref 136–145)
Total Bilirubin: 0.4 mg/dL (ref 0.20–1.20)
Total Protein: 6.2 g/dL — ABNORMAL LOW (ref 6.4–8.3)

## 2014-08-18 LAB — CBC WITH DIFFERENTIAL/PLATELET
BASO%: 0.6 % (ref 0.0–2.0)
BASOS ABS: 0.1 10*3/uL (ref 0.0–0.1)
EOS%: 0.1 % (ref 0.0–7.0)
Eosinophils Absolute: 0 10*3/uL (ref 0.0–0.5)
HCT: 40.5 % (ref 38.4–49.9)
HGB: 13.3 g/dL (ref 13.0–17.1)
LYMPH%: 11.2 % — ABNORMAL LOW (ref 14.0–49.0)
MCH: 30.1 pg (ref 27.2–33.4)
MCHC: 32.8 g/dL (ref 32.0–36.0)
MCV: 91.7 fL (ref 79.3–98.0)
MONO#: 0.4 10*3/uL (ref 0.1–0.9)
MONO%: 4.9 % (ref 0.0–14.0)
NEUT#: 7.3 10*3/uL — ABNORMAL HIGH (ref 1.5–6.5)
NEUT%: 83.2 % — AB (ref 39.0–75.0)
PLATELETS: 189 10*3/uL (ref 140–400)
RBC: 4.42 10*6/uL (ref 4.20–5.82)
RDW: 15.9 % — ABNORMAL HIGH (ref 11.0–14.6)
WBC: 8.7 10*3/uL (ref 4.0–10.3)
lymph#: 1 10*3/uL (ref 0.9–3.3)

## 2014-08-18 LAB — TECHNOLOGIST REVIEW

## 2014-08-18 MED ORDER — LORAZEPAM 0.5 MG PO TABS: 0.5000 mg | ORAL_TABLET | Freq: Three times a day (TID) | ORAL | Status: DC | PRN

## 2014-08-18 NOTE — Progress Notes (Signed)
[place] NOTE  Patient Care Team: [name], MD as PCP - General (Family Medicine) Elaina Hoops, MD as Consulting Physician (Neurosurgery) Eppie Gibson, MD as Attending Physician (Radiation Oncology) Truitt Merle, MD as Consulting Physician (Hematology)     Glioblastoma of occipital lobe   07/02/2014 Imaging Brain MRI: Two adjacent enhancing mass lesions in the right parietal lobe with central necrosis and extensive surrounding edema and mass effect.  7 mm midline shift to the left.  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   CT CAP(-)   07/07/2014 Surgery Subtotal right parietal tumors resection      CURRENT THERAPY: Concurrent chemoRT, Temodar 75mg /m2 (150mg ) daily started on 08/03/2014  CHIEF COMPLAINTS: follow up   HISTORY OF INITIAL PRESENTATION;   He initially presented was headaches and sinus congestion in mid October 2015. He was seen by his primary care physician and was treated for sinus infection. He subsequently developed left sided visual deficit, left arm and leg weakness and unsteady gait. He was sent to emergency room by his primary care physician on 07/02/2014. CT  And MRI of brain reviewed to add adjacent enhancing mass lesions in the right parietal lobe with central necrosis and extensive surrounding edema and mass effect. He was admitted and underwent right craniotomy  For resection of the 2 brain masses by Dr. Lonell [name]. Postsurgical brain MRI revealed a 1.3 cm area of enhancement at the anterior margin of surgical resection cavity, likely surgical change. His case was reviewed in the tumor board and it was felt he had subtotal resection.  INTERVAL HISTROY: Jake [name] returns for follow-up. He is on week 3 of concurrent chemoradiation. He is tolerating the treatment very well. He is on dexamethasone 2 mg twice a day, he has very good appetite, and gained some weight. He has mild nausea with Temodar, which is controlled by taking Zofran. No  significant cytopenia since he started. No fever or chills. He has no focal weakness, but does feel some generalized weakness especially on her legs.    MEDICAL HISTORY:  Past Medical History  Diagnosis Date  . Thyroid disease   . Hypertension   . Allergy   . Arthritis   .    Marland Kitchen Kidney stone     SURGICAL HISTORY: Past Surgical History  Procedure Laterality Date  . Knee arthroscopy w/ meniscal repair    . Treatment fistula anal    . 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 Marital Status: Married    Spouse Name: N/A    Number of Children: 5 children, age of 86-21  . Years of Education: N/A   Occupational History  . 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    Social History Main Topics  . Smoking status: Current Every Day Smoker -- 1.00 packs/day for 45 years    Types: Cigarettes  . Smokeless tobacco: Never Used  . Alcohol Use: Yes     Comment: 2-3 beers per/day   . Drug Use: No  . Sexual Activity: Not on file     FAMILY HISTORY: Family History  Problem Relation Age of Onset  . Hypertension Mother   . Diabetes Father   . Glaucoma Father   . Diabetes Brother   . Diabetes Brother   .  Colon cancer Neg Hx   Paternal ancle had prostate cancer. No other family history of malignancy.  ALLERGIES:  has No Known Allergies.   MEDICATIONS:  Current Outpatient Prescriptions  Medication Sig Dispense Refill  . b complex vitamins tablet Take 1 tablet by mouth daily.    . cyclobenzaprine (FLEXERIL) 10 MG tablet Take 10 mg by mouth as needed.   0  . dexamethasone (DECADRON) 2 MG tablet Take 1 tablet (2 mg total) by mouth 2 (two) times daily. 100 tablet 0  . esomeprazole (NEXIUM) 20 MG capsule Take 1 capsule (20 mg total) by mouth daily at 12 noon. 30 capsule 0  .  eszopiclone (LUNESTA) 2 MG TABS tablet Take 1 tablet (2 mg total) by mouth at bedtime as needed for sleep. Take immediately before bedtime 30 tablet 0  . HYDROcodone-acetaminophen (NORCO/VICODIN) 5-325 MG per tablet Take 1 tablet by mouth every 4 (four) hours as needed for moderate pain. 90 tablet 0  . levothyroxine (SYNTHROID, LEVOTHROID) 50 MCG tablet Take 50 mcg by mouth daily before breakfast. TAKE ONE TABLET BY MOUTH EVERY DAY    . lisinopril (PRINIVIL,ZESTRIL) 40 MG tablet Take 40 mg by mouth daily. TAKE ONE TABLET BY MOUTH EVERY DAY    . magnesium 30 MG tablet Take 30 mg by mouth daily.     . Multiple Vitamins-Minerals (MULTIVITAMIN WITH MINERALS) tablet Take 1 tablet by mouth daily.    . Probiotic Product (PROBIOTIC DAILY PO) Take by mouth daily.     Marland Kitchen sulfamethoxazole-trimethoprim (BACTRIM DS) 800-160 MG per tablet Take 1 tablet by mouth 3 (three) times a week. 14 tablet 6  . temozolomide (TEMODAR) 180 MG capsule Take 1 capsule (180 mg total) by mouth daily. May take on an empty stomach or at bedtime to decrease nausea & vomiting. 42 capsule 0  . verapamil (COVERA HS) 180 MG (CO) 24 hr tablet Take 180 mg by mouth at bedtime.    . Vitamin D-Vitamin K (VITAMIN K2-VITAMIN D3) 45-2000 MCG-UNIT CAPS Take by mouth daily.    Marland Kitchen LORazepam (ATIVAN) 0.5 MG tablet Take 1 tablet (0.5 mg total) by mouth every 8 (eight) hours as needed for anxiety or sleep. 40 tablet 0  . ondansetron (ZOFRAN) 8 MG tablet Take 1 tablet (8 mg total) by mouth every 8 (eight) hours as needed for nausea or vomiting. (Patient not taking: Reported on 08/15/2014) 30 tablet 4   No current facility-administered medications for this visit.    REVIEW OF SYSTEMS:   Constitutional: Denies fevers, chills or abnormal night sweats Eyes: Denies blurriness of vision, double vision or watery eyes Ears, nose, mouth, throat, and face: Denies mucositis or sore throat Respiratory: Denies cough, dyspnea or wheezes Cardiovascular: Denies  palpitation, chest discomfort or lower extremity swelling Gastrointestinal:  Denies nausea, heartburn or change in bowel habits Skin: Denies abnormal skin rashes Lymphatics: Denies new lymphadenopathy or easy bruising Neurological:positive for mild headaches, and left side vision deficits. His left-sided weakness has nearly resolved. Denies numbness, tingling or new weaknesses Behavioral/Psych: Mood is stable, no new changes  All other systems were reviewed with the patient and are negative.  PHYSICAL EXAMINATION: ECOG PERFORMANCE STATUS: 1 - Symptomatic but completely ambulatory  KPS: 90%  Filed Vitals:   08/18/14 1412  BP: 152/74  Pulse: 101  Temp: 98.1 F (36.7 C)  Resp: 19   Filed Weights   08/18/14 1412  Weight: 249 lb 9.6 oz (113.218 kg)    GENERAL:alert, no distress and comfortable SKIN: skin color,  texture, turgor are normal, no rashes or significant lesions HEAD: right-sided posterior craniotomy surgical wound is healing well, no surrounding edema, skin redness or discharge EYES: normal, conjunctiva are pink and non-injected, sclera clear OROPHARYNX:no exudate, no erythema and lips, buccal mucosa, and tongue normal  NECK: supple, thyroid normal size, non-tender, without nodularity LYMPH:  no palpable lymphadenopathy in the cervical, axillary or inguinal LUNGS: clear to auscultation and percussion with normal breathing effort HEART: regular rate & rhythm and no murmurs and no lower extremity edema ABDOMEN:abdomen soft, non-tender and normal bowel sounds Musculoskeletal:no cyanosis of digits and no clubbing  PSYCH: alert & oriented x 3 with fluent speech NEURO: cranial nerve 2-12 are intact. Left side vision deficits is pressure in the left upper and mid quadrant. Motor and sensitivity are normal and symmetric on all extremities. Speech is fluent, coordination is grossly intact. Ambulates independently and Lutricia Feil sign negative.  LABORATORY DATA:  I have reviewed the  data as listed Lab Results  Component Value Date   WBC 8.7 08/18/2014   HGB 13.3 08/18/2014   HCT 40.5 08/18/2014   MCV 91.7 08/18/2014   PLT 189 08/18/2014    Recent Labs  07/02/14 1518 07/02/14 1548 07/08/14 0644 08/01/14 1140 08/11/14 1046 08/18/14 1354  NA 136* 136* 134* 134* 135* 135*  K 4.0 4.1 4.7 4.2 5.1 4.5  CL 102 100 99  --   --   --   CO2  --  24 23 25 26 24   GLUCOSE 102* 96 100* 111 82 105  BUN 12 12 26* 25.4 23.2 24.2  CREATININE 0.80 0.85 0.75 0.9 0.8 0.9  CALCIUM  --  9.3 8.4 9.1 9.3 9.0  GFRNONAA  --  >90 >90  --   --   --   GFRAA  --  >90 >90  --   --   --   PROT  --  6.9  --  5.7* 5.9* 6.2*  ALBUMIN  --  3.7  --  3.3* 3.3* 3.2*  AST  --  22  --  18 21 25   ALT  --  20  --  41 44 58*  ALKPHOS  --  57  --  53 53 80  BILITOT  --  0.5  --  0.55 0.53 0.40    Surgical path 07/07/2014  Diagnosis 1. Brain, biopsy - GLIOBLASTOMA MULTIFORME, WHO GRADE IV/IV. - SEE ONCOLOGY TABLE BELOW. 2. Brain, for tumor resection, higher right parietal - GLIOBLASTOMA MULTIFORME, WHO GRADE IV/IV. - SEE ONCOLOGY TABLE BELOW. 3. Brain, for tumor resection, lower right occipital - GLIOBLASTOMA MULTIFORME, WHO GRADE IV/IV   Microscopic Comment 1. -3. ONCOLOGY TABLE - BRAIN AND SPINAL CORD 1. Procedure: Resection x2 2. Tumor site, including laterality: Right parietal and right occipital 3. Maximum tumor size (cm): At least 1.8 cm 4. Histologic type: Glioblastoma multiforme 5. Grade: WHO Grade IV/IV 6. Margins (if applicable): Can not be assessed 7. Ancillary studies: Can be performed upon clinician request.  RADIOGRAPHIC STUDIES: I have personally reviewed the radiological images as listed and agreed with the findings in the report.  Mr [name] Wo Contrast 07/08/2014    IMPRESSION:  1. Postoperative changes from interval right occipital craniotomy with resection of right parieto-occipital mass lesions.  2. 1.3 x 0.5 x 0.5 cm area of enhancement at the anterior margin  of the more superior resection cavity in the right parieto-occipital region. It is unclear whether this reflects residual tumor or possibly normal enhancement of the choroid  plexus in the adjacent compressed occipital horn of the right lateral ventricle. Attention on follow-up recommended.  3. Fairly linear enhancement about the more inferior resection cavity without definite evidence of residual tumor.  4. Small amount of associated postoperative infarct at the medial aspect of the more superior resection site.  5. Stable 6 mm focus of restricted diffusion in the left frontal lobe, compatible with an acute or subacute infarct.  6. Slightly improved vasogenic edema within the right parieto-occipital region with persistent 7 mm of right-to-left midline shift.     Electronically Signed   By: Jeannine Boga M.D.   On: 07/08/2014 23:45   Mr Brain W ZE Contrast  IMPRESSION:  Two adjacent enhancing mass lesions in the right parietal lobe with central necrosis and extensive surrounding edema and mass effect. Hyperintensity extends into the splenium of the corpus callosum. 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 Metastatic disease is a less likely consideration. 7 mm midline shift to the left.  Subcentimeter focus of restricted diffusion in the left frontal lobe likely an area of acute or subacute infarction.  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      ASSESSMENT & PLAN:  Mr. Goto is a 58 year old gentleman with newly diagnosed right parietal GBM, status post subtotal resection. He has recovered very well, except mild headaches and left-sided vision deficits.  1. GBM, s/p subtotal resection, on concurrent chemoRT from 08/04/14  -doing well, continue therapy -lab CBC weekly, CMP every other week -cont bactrim DS 1 tab every M, W, F for PCP prophylaxis.  -follow up with me every 2 weeks. -cont Nexium daily when he is on steroids, vitamin the vitamin D and  calcium, and a regular exercise.   All questions were answered. The patient knows to call the clinic with any problems, questions or concerns. I spent 10 minutes counseling the patient face to face. The total time spent in the appointment was 15 minutes and more than 50% was on counseling.     Truitt Merle, MD 08/18/2014 11:24 PM

## 2014-08-19 ENCOUNTER — Ambulatory Visit
Admission: RE | Admit: 2014-08-19 | Discharge: 2014-08-19 | Disposition: A | Discharge status: Home / Self Care | Payer: Commercial Managed Care - PPO | Source: Ambulatory Visit | Attending: Radiation Oncology | Admitting: Radiation Oncology

## 2014-08-19 DIAGNOSIS — Z510 Encounter for antineoplastic radiation therapy: Secondary | ICD-10-CM | POA: Diagnosis not present

## 2014-08-22 ENCOUNTER — Ambulatory Visit
Admission: RE | Admit: 2014-08-22 | Discharge: 2014-08-22 | Disposition: A | Discharge status: Home / Self Care | Payer: Commercial Managed Care - PPO | Source: Ambulatory Visit | Attending: Radiation Oncology | Admitting: Radiation Oncology

## 2014-08-22 ENCOUNTER — Encounter: Payer: Self-pay | Admitting: Radiation Oncology

## 2014-08-22 VITALS — BP 123/72 | HR 93 | Resp 16 | Wt 250.6 lb

## 2014-08-22 DIAGNOSIS — Z510 Encounter for antineoplastic radiation therapy: Secondary | ICD-10-CM | POA: Diagnosis not present

## 2014-08-22 DIAGNOSIS — C714 Malignant neoplasm of occipital lobe: Secondary | ICD-10-CM

## 2014-08-22 MED ORDER — CYCLOBENZAPRINE HCL 10 MG PO TABS: ORAL_TABLET | ORAL | Status: DC

## 2014-08-22 MED ORDER — LEVETIRACETAM 500 MG PO TABS: 500.0000 mg | ORAL_TABLET | Freq: Two times a day (BID) | ORAL | Status: DC

## 2014-08-22 NOTE — Progress Notes (Signed)
Taking decadron 2 mg bid. Reports taking nexium for reflux. Denies headache. Reports difficulty sleeping related to effects of decadron despite trying ativan. Reports vision is "fuzzy" and left eye peripheral vision is gone. Requesting refill of flexeril since he doesn't follow up with [facility] until next week. Weight and vitals stable. Denies nausea, vomiting, dizziness, or seizures. Faint hyperpigmentation of scalp noted without desquamation.

## 2014-08-22 NOTE — Progress Notes (Signed)
   Weekly Management Note:  outpatient    ICD-9-CM ICD-10-CM   1. Glioblastoma of occipital lobe 191.4 C71.4 cyclobenzaprine (FLEXERIL) 10 MG tablet     levETIRAcetam (KEPPRA) 500 MG tablet    Current Dose:  28 Gy  Projected Dose: 60 Gy   Narrative:  The patient presents for routine under treatment assessment.  CBCT/MVCT images/Port film x-rays were reviewed.  The chart was checked. Doing well. No new neurologic complaints. Still on Decadron 2mg  BID. Describe spontaneous muscle contracting in his 4 extremities for which he is taking Flexeril. Started when in hospital after surgery.  No LOC or jerking.  Sleep didn't improve on lunesta but Ativa helps.  Physical Findings:  weight is 250 lb 9.6 oz (113.671 kg). His blood pressure is 123/72 and his pulse is 93. His respiration is 16.  no thrush. Ambulatory. No skin irritation.  CBC    Component Value Date/Time   WBC 8.7 08/18/2014 1354   WBC 6.4 07/02/2014 1548   WBC 5.0 01/31/2013 1257   RBC 4.42 08/18/2014 1354   RBC 5.07 07/02/2014 1548   RBC 4.46* 01/31/2013 1257   HGB 13.3 08/18/2014 1354   HGB 15.2 07/02/2014 1548   HGB 13.1* 01/31/2013 1257   HCT 40.5 08/18/2014 1354   HCT 44.5 07/02/2014 1548   HCT 42.1* 01/31/2013 1257   PLT 189 08/18/2014 1354   PLT 198 07/02/2014 1548   MCV 91.7 08/18/2014 1354   MCV 87.8 07/02/2014 1548   MCV 94.3 01/31/2013 1257   MCH 30.1 08/18/2014 1354   MCH 30.0 07/02/2014 1548   MCH 29.4 01/31/2013 1257   MCHC 32.8 08/18/2014 1354   MCHC 34.2 07/02/2014 1548   MCHC 31.1* 01/31/2013 1257   RDW 15.9* 08/18/2014 1354   RDW 13.5 07/02/2014 1548   LYMPHSABS 1.0 08/18/2014 1354   LYMPHSABS 1.5 07/02/2014 1548   MONOABS 0.4 08/18/2014 1354   MONOABS 0.5 07/02/2014 1548   EOSABS 0.0 08/18/2014 1354   EOSABS 0.0 07/02/2014 1548   BASOSABS 0.1 08/18/2014 1354   BASOSABS 0.0 07/02/2014 1548     CMP     Component Value Date/Time   NA 135* 08/18/2014 1354   NA 134* 07/08/2014 0644   K 4.5  08/18/2014 1354   K 4.7 07/08/2014 0644   CL 99 07/08/2014 0644   CO2 24 08/18/2014 1354   CO2 23 07/08/2014 0644   GLUCOSE 105 08/18/2014 1354   GLUCOSE 100* 07/08/2014 0644   BUN 24.2 08/18/2014 1354   BUN 26* 07/08/2014 0644   CREATININE 0.9 08/18/2014 1354   CREATININE 0.75 07/08/2014 0644   CREATININE 0.86 01/25/2014 1015   CALCIUM 9.0 08/18/2014 1354   CALCIUM 8.4 07/08/2014 0644   PROT 6.2* 08/18/2014 1354   PROT 6.9 07/02/2014 1548   ALBUMIN 3.2* 08/18/2014 1354   ALBUMIN 3.7 07/02/2014 1548   AST 25 08/18/2014 1354   AST 22 07/02/2014 1548   ALT 58* 08/18/2014 1354   ALT 20 07/02/2014 1548   ALKPHOS 80 08/18/2014 1354   ALKPHOS 57 07/02/2014 1548   BILITOT 0.40 08/18/2014 1354   BILITOT 0.5 07/02/2014 1548   GFRNONAA >90 07/08/2014 0644   GFRAA >90 07/08/2014 0644     Impression:  The patient is tolerating radiotherapy.   Plan:  Continue radiotherapy as planned.   Will try Keppra in case muscle contractures are from seizure activity.    -----------------------------------  Eppie Gibson, MD

## 2014-08-23 ENCOUNTER — Ambulatory Visit
Admission: RE | Admit: 2014-08-23 | Discharge: 2014-08-23 | Disposition: A | Discharge status: Home / Self Care | Payer: Commercial Managed Care - PPO | Source: Ambulatory Visit | Attending: Radiation Oncology | Admitting: Radiation Oncology

## 2014-08-23 DIAGNOSIS — Z510 Encounter for antineoplastic radiation therapy: Secondary | ICD-10-CM | POA: Diagnosis not present

## 2014-08-24 ENCOUNTER — Ambulatory Visit
Admission: RE | Admit: 2014-08-24 | Discharge: 2014-08-24 | Disposition: A | Discharge status: Home / Self Care | Payer: Commercial Managed Care - PPO | Source: Ambulatory Visit | Attending: Radiation Oncology | Admitting: Radiation Oncology

## 2014-08-24 DIAGNOSIS — Z510 Encounter for antineoplastic radiation therapy: Secondary | ICD-10-CM | POA: Diagnosis not present

## 2014-08-25 ENCOUNTER — Encounter: Payer: Self-pay | Admitting: *Deleted

## 2014-08-25 ENCOUNTER — Other Ambulatory Visit (HOSPITAL_BASED_OUTPATIENT_CLINIC_OR_DEPARTMENT_OTHER): Payer: Commercial Managed Care - PPO

## 2014-08-25 ENCOUNTER — Ambulatory Visit
Admission: RE | Admit: 2014-08-25 | Discharge: 2014-08-25 | Disposition: A | Discharge status: Home / Self Care | Payer: Commercial Managed Care - PPO | Source: Ambulatory Visit | Attending: Radiation Oncology | Admitting: Radiation Oncology

## 2014-08-25 DIAGNOSIS — C719 Malignant neoplasm of brain, unspecified: Secondary | ICD-10-CM

## 2014-08-25 DIAGNOSIS — Z510 Encounter for antineoplastic radiation therapy: Secondary | ICD-10-CM | POA: Diagnosis not present

## 2014-08-25 DIAGNOSIS — C714 Malignant neoplasm of occipital lobe: Secondary | ICD-10-CM

## 2014-08-25 DIAGNOSIS — C713 Malignant neoplasm of parietal lobe: Secondary | ICD-10-CM

## 2014-08-25 LAB — COMPREHENSIVE METABOLIC PANEL (CC13)
ALT: 60 U/L — ABNORMAL HIGH (ref 0–55)
AST: 37 U/L — AB (ref 5–34)
Albumin: 3.2 g/dL — ABNORMAL LOW (ref 3.5–5.0)
Alkaline Phosphatase: 58 U/L (ref 40–150)
Anion Gap: 7 mEq/L (ref 3–11)
BUN: 18.7 mg/dL (ref 7.0–26.0)
CHLORIDE: 102 meq/L (ref 98–109)
CO2: 27 mEq/L (ref 22–29)
CREATININE: 0.8 mg/dL (ref 0.7–1.3)
Calcium: 9.2 mg/dL (ref 8.4–10.4)
Glucose: 96 mg/dl (ref 70–140)
Potassium: 4.7 mEq/L (ref 3.5–5.1)
Sodium: 135 mEq/L — ABNORMAL LOW (ref 136–145)
Total Bilirubin: 0.44 mg/dL (ref 0.20–1.20)
Total Protein: 5.7 g/dL — ABNORMAL LOW (ref 6.4–8.3)

## 2014-08-25 LAB — CBC WITH DIFFERENTIAL/PLATELET
BASO%: 0.2 % (ref 0.0–2.0)
BASOS ABS: 0 10*3/uL (ref 0.0–0.1)
EOS ABS: 0 10*3/uL (ref 0.0–0.5)
EOS%: 0.2 % (ref 0.0–7.0)
HEMATOCRIT: 37.2 % — AB (ref 38.4–49.9)
HEMOGLOBIN: 12.4 g/dL — AB (ref 13.0–17.1)
LYMPH#: 0.7 10*3/uL — AB (ref 0.9–3.3)
LYMPH%: 11.6 % — ABNORMAL LOW (ref 14.0–49.0)
MCH: 30 pg (ref 27.2–33.4)
MCHC: 33.3 g/dL (ref 32.0–36.0)
MCV: 90.1 fL (ref 79.3–98.0)
MONO#: 0.4 10*3/uL (ref 0.1–0.9)
MONO%: 7 % (ref 0.0–14.0)
NEUT%: 81 % — AB (ref 39.0–75.0)
NEUTROS ABS: 5.1 10*3/uL (ref 1.5–6.5)
Platelets: 145 10*3/uL (ref 140–400)
RBC: 4.13 10*6/uL — ABNORMAL LOW (ref 4.20–5.82)
RDW: 16.4 % — AB (ref 11.0–14.6)
WBC: 6.3 10*3/uL (ref 4.0–10.3)

## 2014-08-25 LAB — TECHNOLOGIST REVIEW: Technologist Review: 1

## 2014-08-25 MED ORDER — BIAFINE EX EMUL
Freq: Two times a day (BID) | CUTANEOUS | Status: DC
  Administered 2014-08-25: 12:00:00 via TOPICAL

## 2014-08-25 NOTE — Progress Notes (Signed)
[facility] Work  Clinical Social Work was referred by Development worker, community for assessment of psychosocial needs.  Clinical Social Worker met with pt and his wife to assess for distress and other psychosocial needs. CSW reviewed role of CSW and Support Team with both of them. Transportation is an issue and CSW reviewed resources to assist. CSW also provided pt and wife with Support Team hand out and reviewed resources available at the [facility]. Pt and wife are very interested in Brain Tumor Support Group, but transportation is a concern. CSW to make referral to [facility] for help with transportation. They were provided with how to contact the Support Team for additional support. CSW to follow up next week and look forward to following and seeing at [facility].     Clinical Social Work interventions: Resource education Emotional Support [facility] Referral  Loren Racer, [place] Worker [place]  [place] Phone: [phone] Fax: [phone]

## 2014-08-28 ENCOUNTER — Other Ambulatory Visit: Payer: Self-pay | Admitting: Hematology

## 2014-08-28 DIAGNOSIS — C714 Malignant neoplasm of occipital lobe: Secondary | ICD-10-CM

## 2014-08-29 ENCOUNTER — Ambulatory Visit
Admission: RE | Admit: 2014-08-29 | Discharge: 2014-08-29 | Disposition: A | Discharge status: Home / Self Care | Payer: Commercial Managed Care - PPO | Source: Ambulatory Visit | Attending: Radiation Oncology | Admitting: Radiation Oncology

## 2014-08-29 ENCOUNTER — Encounter: Payer: Self-pay | Admitting: Radiation Oncology

## 2014-08-29 VITALS — BP 121/66 | HR 94 | Temp 97.6°F | Ht 71.0 in | Wt 252.0 lb

## 2014-08-29 DIAGNOSIS — C714 Malignant neoplasm of occipital lobe: Secondary | ICD-10-CM

## 2014-08-29 DIAGNOSIS — Z510 Encounter for antineoplastic radiation therapy: Secondary | ICD-10-CM | POA: Diagnosis not present

## 2014-08-29 MED ORDER — ESOMEPRAZOLE MAGNESIUM 20 MG PO CPDR: 20.0000 mg | DELAYED_RELEASE_CAPSULE | Freq: Every day | ORAL | Status: DC

## 2014-08-29 MED ORDER — LORAZEPAM 0.5 MG PO TABS: 0.5000 mg | ORAL_TABLET | Freq: Three times a day (TID) | ORAL | Status: DC | PRN

## 2014-08-29 NOTE — Progress Notes (Signed)
Weekly Management Note:  Site: Right Brain Current Dose:  3600  cGy Projected Dose: 4600   cGy, followed by right parietal brain boost  Narrative: The patient is seen today for routine under treatment assessment. CBCT/MVCT images/port films were reviewed. The chart was reviewed.   He is still doing well.  His left field cut remains unchanged.  He is currently taking 2 mg of dexamethasone by mouth twice a day.  He does report ankle swelling, left greater than right.  He is neurologically stable.  Physical Examination:  Filed Vitals:   08/29/14 1213  BP: 121/66  Pulse: 94  Temp: 97.6 F (36.4 C)  .  Weight: 252 lb (114.306 kg).  He appears cushingoid.  There is no significant alopecia along the right posterior scalp.  Neurologic examination unchanged.  There is bilateral 1-2+ ankle edema, slightly worse on the left.  Impression: Tolerating radiation therapy well.  I suspect that his ankle edema is secondary to fluid retention.  I told him to avoid salt and to elevate his legs when resting.  Plan: Continue radiation therapy as planned.

## 2014-08-29 NOTE — Progress Notes (Signed)
Mr. Patchin has received 18 fractions to his brain.  He is now on Keppra since having periods of rigidity in his arms and now has obtained relief.   C/o pain at the back of his neck which occurs when lying down on the tx. table.  He reports swelling of his ankles with some pitting edema note.  Negative Homan's sign.  Denies any headaches nor nausea, but continues to have blurred vision.  Mouth clear.

## 2014-08-30 ENCOUNTER — Ambulatory Visit
Admission: RE | Admit: 2014-08-30 | Discharge: 2014-08-30 | Disposition: A | Discharge status: Home / Self Care | Payer: Commercial Managed Care - PPO | Source: Ambulatory Visit | Attending: Radiation Oncology | Admitting: Radiation Oncology

## 2014-08-30 ENCOUNTER — Encounter: Payer: Self-pay | Admitting: Hematology

## 2014-08-30 DIAGNOSIS — Z510 Encounter for antineoplastic radiation therapy: Secondary | ICD-10-CM | POA: Diagnosis not present

## 2014-08-30 NOTE — Progress Notes (Signed)
Patient that I call [place] about his disability.  Called Coralyn Mark @ [phone] ext [phone] and faxed what he needed to [phone]

## 2014-08-31 ENCOUNTER — Ambulatory Visit
Admission: RE | Admit: 2014-08-31 | Discharge: 2014-08-31 | Disposition: A | Discharge status: Home / Self Care | Payer: Commercial Managed Care - PPO | Source: Ambulatory Visit | Attending: Radiation Oncology | Admitting: Radiation Oncology

## 2014-08-31 DIAGNOSIS — Z510 Encounter for antineoplastic radiation therapy: Secondary | ICD-10-CM | POA: Diagnosis not present

## 2014-09-01 ENCOUNTER — Encounter: Payer: Self-pay | Admitting: Hematology

## 2014-09-01 ENCOUNTER — Telehealth: Payer: Self-pay | Admitting: Hematology

## 2014-09-01 ENCOUNTER — Other Ambulatory Visit (HOSPITAL_BASED_OUTPATIENT_CLINIC_OR_DEPARTMENT_OTHER): Payer: Commercial Managed Care - PPO

## 2014-09-01 ENCOUNTER — Ambulatory Visit
Admission: RE | Admit: 2014-09-01 | Discharge: 2014-09-01 | Disposition: A | Discharge status: Home / Self Care | Payer: Commercial Managed Care - PPO | Source: Ambulatory Visit | Attending: Radiation Oncology | Admitting: Radiation Oncology

## 2014-09-01 ENCOUNTER — Ambulatory Visit (HOSPITAL_BASED_OUTPATIENT_CLINIC_OR_DEPARTMENT_OTHER): Payer: Commercial Managed Care - PPO | Admitting: Hematology

## 2014-09-01 VITALS — BP 149/68 | HR 102 | Temp 98.3°F | Resp 20 | Ht 71.0 in | Wt 252.0 lb

## 2014-09-01 DIAGNOSIS — C714 Malignant neoplasm of occipital lobe: Secondary | ICD-10-CM

## 2014-09-01 DIAGNOSIS — C719 Malignant neoplasm of brain, unspecified: Secondary | ICD-10-CM

## 2014-09-01 DIAGNOSIS — Z510 Encounter for antineoplastic radiation therapy: Secondary | ICD-10-CM | POA: Diagnosis not present

## 2014-09-01 DIAGNOSIS — C713 Malignant neoplasm of parietal lobe: Secondary | ICD-10-CM

## 2014-09-01 LAB — CBC WITH DIFFERENTIAL/PLATELET
BASO%: 0.3 % (ref 0.0–2.0)
Basophils Absolute: 0 10*3/uL (ref 0.0–0.1)
EOS ABS: 0 10*3/uL (ref 0.0–0.5)
EOS%: 0.1 % (ref 0.0–7.0)
HCT: 38 % — ABNORMAL LOW (ref 38.4–49.9)
HEMOGLOBIN: 12.9 g/dL — AB (ref 13.0–17.1)
LYMPH%: 9.7 % — AB (ref 14.0–49.0)
MCH: 30.8 pg (ref 27.2–33.4)
MCHC: 33.9 g/dL (ref 32.0–36.0)
MCV: 90.7 fL (ref 79.3–98.0)
MONO#: 0.5 10*3/uL (ref 0.1–0.9)
MONO%: 7 % (ref 0.0–14.0)
NEUT%: 82.9 % — AB (ref 39.0–75.0)
NEUTROS ABS: 6.4 10*3/uL (ref 1.5–6.5)
Platelets: 153 10*3/uL (ref 140–400)
RBC: 4.19 10*6/uL — ABNORMAL LOW (ref 4.20–5.82)
RDW: 16.9 % — ABNORMAL HIGH (ref 11.0–14.6)
WBC: 7.7 10*3/uL (ref 4.0–10.3)
lymph#: 0.8 10*3/uL — ABNORMAL LOW (ref 0.9–3.3)

## 2014-09-01 LAB — COMPREHENSIVE METABOLIC PANEL (CC13)
ALT: 52 U/L (ref 0–55)
AST: 24 U/L (ref 5–34)
Albumin: 3.3 g/dL — ABNORMAL LOW (ref 3.5–5.0)
Alkaline Phosphatase: 63 U/L (ref 40–150)
Anion Gap: 9 mEq/L (ref 3–11)
BUN: 21.1 mg/dL (ref 7.0–26.0)
CO2: 25 mEq/L (ref 22–29)
Calcium: 9.2 mg/dL (ref 8.4–10.4)
Chloride: 104 mEq/L (ref 98–109)
Creatinine: 0.8 mg/dL (ref 0.7–1.3)
EGFR: >90 mL/min/{1.73_m2} (ref >90)
Glucose: 106 mg/dl (ref 70–140)
Potassium: 4.1 mEq/L (ref 3.5–5.1)
Sodium: 138 mEq/L (ref 136–145)
Total Bilirubin: 0.51 mg/dL (ref 0.20–1.20)
Total Protein: 5.8 g/dL — ABNORMAL LOW (ref 6.4–8.3)

## 2014-09-01 LAB — TECHNOLOGIST REVIEW

## 2014-09-01 NOTE — Progress Notes (Signed)
[facility] NOTE  Patient Care Team: [name], MD as PCP - General (Family Medicine) Elaina Hoops, MD as Consulting Physician (Neurosurgery) Eppie Gibson, MD as Attending Physician (Radiation Oncology) Truitt Merle, MD as Consulting Physician (Hematology)     Glioblastoma of occipital lobe   07/02/2014 Imaging Brain MRI: Two adjacent enhancing mass lesions in the right parietal lobe with central necrosis and extensive surrounding edema and mass effect.  7 mm midline shift to the left.  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   CT CAP(-)   07/07/2014 Surgery Subtotal right parietal tumors resection      CURRENT THERAPY: Concurrent chemoRT, Temodar 75mg /m2 (150mg ) daily started on 08/03/2014  CHIEF COMPLAINTS: follow up   HISTORY OF INITIAL PRESENTATION;   He initially presented was headaches and sinus congestion in mid October 2015. He was seen by his primary care physician and was treated for sinus infection. He subsequently developed left sided visual deficit, left arm and leg weakness and unsteady gait. He was sent to emergency room by his primary care physician on 07/02/2014. CT  And MRI of brain reviewed to add adjacent enhancing mass lesions in the right parietal lobe with central necrosis and extensive surrounding edema and mass effect. He was admitted and underwent right craniotomy  For resection of the 2 brain masses by Dr. Lonell Grandchild. Postsurgical brain MRI revealed a 1.3 cm area of enhancement at the anterior margin of surgical resection cavity, likely surgical change. His case was reviewed in the tumor board and it was felt he had subtotal resection.  INTERVAL HISTROY: Travers returns for follow-up. He is on week 5 of concurrent chemoradiation. He is tolerating the treatment very well. No new complains. His left vision deficit is stable, no double vision. He tolerates Temodar well, no nausea or other complains. His labs has been unremarkable. No  fever or chills. He is able to sleep better with ativan. Still gaining some weight, he remains pretty active at home.     MEDICAL HISTORY:  Past Medical History  Diagnosis Date  . Thyroid disease   . Hypertension   . Allergy   . Arthritis   .    Marland Kitchen Kidney stone     SURGICAL HISTORY: Past Surgical History  Procedure Laterality Date  . Knee arthroscopy w/ meniscal repair    . Treatment fistula anal    . 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 Marital Status: Married    Spouse Name: N/A    Number of Children: 5 children, age of 71-21  . Years of Education: N/A   Occupational History  . 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    Social History Main Topics  . Smoking status: Current Every Day Smoker -- 1.00 packs/day for 45 years    Types: Cigarettes  . Smokeless tobacco: Never Used  . Alcohol Use: Yes     Comment: 2-3 beers per/day   . Drug Use: No  . Sexual Activity: Not on file     FAMILY HISTORY: Family History  Problem Relation Age of Onset  . Hypertension Mother   . Diabetes Father   . Glaucoma Father   . Diabetes Brother   . Diabetes Brother   . Colon cancer Neg  Hx   Paternal ancle had prostate cancer. No other family history of malignancy.  ALLERGIES:  has No Known Allergies.   MEDICATIONS:  Current Outpatient Prescriptions  Medication Sig Dispense Refill  . b complex vitamins tablet Take 1 tablet by mouth daily.    . cyclobenzaprine (FLEXERIL) 10 MG tablet Take 1 tablet up to TID PRN. 90 tablet 2  . dexamethasone (DECADRON) 2 MG tablet Take 1 tablet (2 mg total) by mouth 2 (two) times daily. 100 tablet 0  . esomeprazole (NEXIUM) 20 MG capsule Take 1 capsule (20 mg total) by mouth daily at 12 noon. 30 capsule 1  . eszopiclone (LUNESTA) 2 MG TABS  tablet Take 1 tablet (2 mg total) by mouth at bedtime as needed for sleep. Take immediately before bedtime 30 tablet 0  . HYDROcodone-acetaminophen (NORCO/VICODIN) 5-325 MG per tablet Take 1 tablet by mouth every 4 (four) hours as needed for moderate pain. 90 tablet 0  . levETIRAcetam (KEPPRA) 500 MG tablet Take 1 tablet (500 mg total) by mouth 2 (two) times daily. 60 tablet 0  . levothyroxine (SYNTHROID, LEVOTHROID) 50 MCG tablet Take 50 mcg by mouth daily before breakfast. TAKE ONE TABLET BY MOUTH EVERY DAY    . lisinopril (PRINIVIL,ZESTRIL) 40 MG tablet Take 40 mg by mouth daily. TAKE ONE TABLET BY MOUTH EVERY DAY    . LORazepam (ATIVAN) 0.5 MG tablet Take 1 tablet (0.5 mg total) by mouth every 8 (eight) hours as needed for anxiety or sleep. 40 tablet 1  . magnesium 30 MG tablet Take 30 mg by mouth daily.     . Multiple Vitamins-Minerals (MULTIVITAMIN WITH MINERALS) tablet Take 1 tablet by mouth daily.    . ondansetron (ZOFRAN) 8 MG tablet Take 1 tablet (8 mg total) by mouth every 8 (eight) hours as needed for nausea or vomiting. 30 tablet 4  . Probiotic Product (PROBIOTIC DAILY PO) Take by mouth daily.     Marland Kitchen temozolomide (TEMODAR) 180 MG capsule Take 1 capsule (180 mg total) by mouth daily. May take on an empty stomach or at bedtime to decrease nausea & vomiting. 42 capsule 0  . verapamil (COVERA HS) 180 MG (CO) 24 hr tablet Take 180 mg by mouth at bedtime.    . Vitamin D-Vitamin K (VITAMIN K2-VITAMIN D3) 45-2000 MCG-UNIT CAPS Take by mouth daily.    Marland Kitchen sulfamethoxazole-trimethoprim (BACTRIM DS) 800-160 MG per tablet Take 1 tablet by mouth 3 (three) times a week. 14 tablet 6   No current facility-administered medications for this visit.    REVIEW OF SYSTEMS:   Constitutional: Denies fevers, chills or abnormal night sweats Eyes: Denies blurriness of vision, double vision or watery eyes Ears, nose, mouth, throat, and face: Denies mucositis or sore throat Respiratory: Denies cough, dyspnea or  wheezes Cardiovascular: Denies palpitation, chest discomfort or lower extremity swelling Gastrointestinal:  Denies nausea, heartburn or change in bowel habits Skin: Denies abnormal skin rashes Lymphatics: Denies new lymphadenopathy or easy bruising Neurological:positive for mild headaches, and left side vision deficits. His left-sided weakness has nearly resolved. Denies numbness, tingling or new weaknesses Behavioral/Psych: Mood is stable, no new changes  All other systems were reviewed with the patient and are negative.  PHYSICAL EXAMINATION: ECOG PERFORMANCE STATUS: 1 - Symptomatic but completely ambulatory  KPS: 90%  Filed Vitals:   09/01/14 1102  BP: 149/68  Pulse: 102  Temp: 98.3 F (36.8 C)  Resp: 20   Filed Weights   09/01/14 1102  Weight: 252 lb (114.306 kg)  GENERAL:alert, no distress and comfortable SKIN: skin color, texture, turgor are normal, no rashes or significant lesions HEAD: right-sided posterior craniotomy surgical wound is healing well, no surrounding edema, skin redness or discharge EYES: normal, conjunctiva are pink and non-injected, sclera clear OROPHARYNX:no exudate, no erythema and lips, buccal mucosa, and tongue normal  NECK: supple, thyroid normal size, non-tender, without nodularity LYMPH:  no palpable lymphadenopathy in the cervical, axillary or inguinal LUNGS: clear to auscultation and percussion with normal breathing effort HEART: regular rate & rhythm and no murmurs and no lower extremity edema ABDOMEN:abdomen soft, non-tender and normal bowel sounds Musculoskeletal:no cyanosis of digits and no clubbing  PSYCH: alert & oriented x 3 with fluent speech NEURO: cranial nerve 2-12 are intact. Left side vision deficits is pressure in the left upper and mid quadrant. Motor and sensitivity are normal and symmetric on all extremities. Speech is fluent, coordination is grossly intact. Ambulates independently and Lutricia Feil sign negative.  LABORATORY  DATA:  I have reviewed the data as listed Lab Results  Component Value Date   WBC 7.7 09/01/2014   HGB 12.9* 09/01/2014   HCT 38.0* 09/01/2014   MCV 90.7 09/01/2014   PLT 153 09/01/2014    Recent Labs  07/02/14 1518 07/02/14 1548 07/08/14 0644  08/18/14 1354 08/25/14 1100 09/01/14 1041  NA 136* 136* 134*  < > 135* 135* 138  K 4.0 4.1 4.7  < > 4.5 4.7 4.1  CL 102 100 99  --   --   --   --   CO2  --  24 23  < > 24 27 25   GLUCOSE 102* 96 100*  < > 105 96 106  BUN 12 12 26*  < > 24.2 18.7 21.1  CREATININE 0.80 0.85 0.75  < > 0.9 0.8 0.8  CALCIUM  --  9.3 8.4  < > 9.0 9.2 9.2  GFRNONAA  --  >90 >90  --   --   --   --   GFRAA  --  >90 >90  --   --   --   --   PROT  --  6.9  --   < > 6.2* 5.7* 5.8*  ALBUMIN  --  3.7  --   < > 3.2* 3.2* 3.3*  AST  --  22  --   < > 25 37* 24  ALT  --  20  --   < > 58* 60* 52  ALKPHOS  --  57  --   < > 80 58 63  BILITOT  --  0.5  --   < > 0.40 0.44 0.51  < > = values in this interval not displayed.  Surgical path 07/07/2014  Diagnosis 1. Brain, biopsy - GLIOBLASTOMA MULTIFORME, WHO GRADE IV/IV. - SEE ONCOLOGY TABLE BELOW. 2. Brain, for tumor resection, higher right parietal - GLIOBLASTOMA MULTIFORME, WHO GRADE IV/IV. - SEE ONCOLOGY TABLE BELOW. 3. Brain, for tumor resection, lower right occipital - GLIOBLASTOMA MULTIFORME, WHO GRADE IV/IV   Microscopic Comment 1. -3. ONCOLOGY TABLE - BRAIN AND SPINAL CORD 1. Procedure: Resection x2 2. Tumor site, including laterality: Right parietal and right occipital 3. Maximum tumor size (cm): At least 1.8 cm 4. Histologic type: Glioblastoma multiforme 5. Grade: WHO Grade IV/IV 6. Margins (if applicable): Can not be assessed 7. Ancillary studies: Can be performed upon clinician request.  RADIOGRAPHIC STUDIES: I have personally reviewed the radiological images as listed and agreed with the findings in the report.  Mr [name]  Wo Contrast 07/08/2014    IMPRESSION:  1. Postoperative changes from  interval right occipital craniotomy with resection of right parieto-occipital mass lesions.  2. 1.3 x 0.5 x 0.5 cm area of enhancement at the anterior margin of the more superior resection cavity in the right parieto-occipital region. It is unclear whether this reflects residual tumor or possibly normal enhancement of the choroid plexus in the adjacent compressed occipital horn of the right lateral ventricle. Attention on follow-up recommended.  3. Fairly linear enhancement about the more inferior resection cavity without definite evidence of residual tumor.  4. Small amount of associated postoperative infarct at the medial aspect of the more superior resection site.  5. Stable 6 mm focus of restricted diffusion in the left frontal lobe, compatible with an acute or subacute infarct.  6. Slightly improved vasogenic edema within the right parieto-occipital region with persistent 7 mm of right-to-left midline shift.     Electronically Signed   By: Jeannine Boga M.D.   On: 07/08/2014 23:45   Mr Brain W JE Contrast  IMPRESSION:  Two adjacent enhancing mass lesions in the right parietal lobe with central necrosis and extensive surrounding edema and mass effect. Hyperintensity extends into the splenium of the corpus callosum. 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 Metastatic disease is a less likely consideration. 7 mm midline shift to the left.  Subcentimeter focus of restricted diffusion in the left frontal lobe likely an area of acute or subacute infarction.  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      ASSESSMENT & PLAN:  Mr. Riviera is a 58 year old gentleman with newly diagnosed right parietal GBM, status post subtotal resection. He has recovered very well, except mild headaches and left-sided vision deficits.  1. GBM, s/p subtotal resection, on concurrent chemoRT from 08/04/14  -doing well, continue therapy -lab CBC weekly, CMP every other week -cont  bactrim DS 1 tab every M, W, F for PCP prophylaxis.  -cont Nexium daily when he is on steroids, vitamin the vitamin D and calcium, and a regular exercise.  RTC in 3 weeks, and we will discuss adjuvant Temodar then.    All questions were answered. The patient knows to call the clinic with any problems, questions or concerns. I spent 10 minutes counseling the patient face to face. The total time spent in the appointment was 15 minutes and more than 50% was on counseling.     Truitt Merle, MD 09/01/2014 11:35 AM

## 2014-09-01 NOTE — Telephone Encounter (Signed)
Not able to lvm. Mailed cal & avs.

## 2014-09-05 ENCOUNTER — Encounter: Payer: Self-pay | Admitting: Radiation Oncology

## 2014-09-05 ENCOUNTER — Ambulatory Visit
Admission: RE | Admit: 2014-09-05 | Discharge: 2014-09-05 | Disposition: A | Discharge status: Home / Self Care | Payer: Commercial Managed Care - PPO | Source: Ambulatory Visit | Attending: Radiation Oncology | Admitting: Radiation Oncology

## 2014-09-05 ENCOUNTER — Encounter: Payer: Self-pay | Admitting: Hematology

## 2014-09-05 VITALS — BP 130/73 | HR 78 | Temp 97.6°F | Resp 20 | Wt 254.4 lb

## 2014-09-05 DIAGNOSIS — C714 Malignant neoplasm of occipital lobe: Secondary | ICD-10-CM

## 2014-09-05 DIAGNOSIS — Z510 Encounter for antineoplastic radiation therapy: Secondary | ICD-10-CM | POA: Diagnosis not present

## 2014-09-05 MED ORDER — LEVETIRACETAM 750 MG PO TABS: 750.0000 mg | ORAL_TABLET | Freq: Two times a day (BID) | ORAL | Status: DC

## 2014-09-05 NOTE — Progress Notes (Signed)
Weekly Management Note:  outpatient    ICD-9-CM ICD-10-CM   1. Glioblastoma of occipital lobe 191.4 C71.4 levETIRAcetam (KEPPRA) 750 MG tablet    Current Dose:  44 Gy  Projected Dose: 60 Gy   Narrative:  The patient presents for routine under treatment assessment.  CBCT/MVCT images/Port film x-rays were reviewed.  The chart was checked. Doing well. No new neurologic complaints. Still on Decadron 2mg  BID. Describes spontaneous muscle contracting in his 4 extremities for which he is taking Flexeril and Keppra.  Keppra helped initially, but episodes are worsening again. Started when in hospital after surgery.  No LOC or jerking.  Sleep didn't improve on Lunesta but Ativan helps.  Physical Findings:  weight is 254 lb 6.4 oz (115.395 kg). His oral temperature is 97.6 F (36.4 C). His blood pressure is 130/73 and his pulse is 78. His respiration is 20.  no thrush. Ambulatory. Scar on scalp intact. Skin mildly erythematous over scalp.  CBC    Component Value Date/Time   WBC 7.7 09/01/2014 1041   WBC 6.4 07/02/2014 1548   WBC 5.0 01/31/2013 1257   RBC 4.19* 09/01/2014 1041   RBC 5.07 07/02/2014 1548   RBC 4.46* 01/31/2013 1257   HGB 12.9* 09/01/2014 1041   HGB 15.2 07/02/2014 1548   HGB 13.1* 01/31/2013 1257   HCT 38.0* 09/01/2014 1041   HCT 44.5 07/02/2014 1548   HCT 42.1* 01/31/2013 1257   PLT 153 09/01/2014 1041   PLT 198 07/02/2014 1548   MCV 90.7 09/01/2014 1041   MCV 87.8 07/02/2014 1548   MCV 94.3 01/31/2013 1257   MCH 30.8 09/01/2014 1041   MCH 30.0 07/02/2014 1548   MCH 29.4 01/31/2013 1257   MCHC 33.9 09/01/2014 1041   MCHC 34.2 07/02/2014 1548   MCHC 31.1* 01/31/2013 1257   RDW 16.9* 09/01/2014 1041   RDW 13.5 07/02/2014 1548   LYMPHSABS 0.8* 09/01/2014 1041   LYMPHSABS 1.5 07/02/2014 1548   MONOABS 0.5 09/01/2014 1041   MONOABS 0.5 07/02/2014 1548   EOSABS 0.0 09/01/2014 1041   EOSABS 0.0 07/02/2014 1548   BASOSABS 0.0 09/01/2014 1041   BASOSABS 0.0 07/02/2014  1548     CMP     Component Value Date/Time   NA 138 09/01/2014 1041   NA 134* 07/08/2014 0644   K 4.1 09/01/2014 1041   K 4.7 07/08/2014 0644   CL 99 07/08/2014 0644   CO2 25 09/01/2014 1041   CO2 23 07/08/2014 0644   GLUCOSE 106 09/01/2014 1041   GLUCOSE 100* 07/08/2014 0644   BUN 21.1 09/01/2014 1041   BUN 26* 07/08/2014 0644   CREATININE 0.8 09/01/2014 1041   CREATININE 0.75 07/08/2014 0644   CREATININE 0.86 01/25/2014 1015   CALCIUM 9.2 09/01/2014 1041   CALCIUM 8.4 07/08/2014 0644   PROT 5.8* 09/01/2014 1041   PROT 6.9 07/02/2014 1548   ALBUMIN 3.3* 09/01/2014 1041   ALBUMIN 3.7 07/02/2014 1548   AST 24 09/01/2014 1041   AST 22 07/02/2014 1548   ALT 52 09/01/2014 1041   ALT 20 07/02/2014 1548   ALKPHOS 63 09/01/2014 1041   ALKPHOS 57 07/02/2014 1548   BILITOT 0.51 09/01/2014 1041   BILITOT 0.5 07/02/2014 1548   GFRNONAA >90 07/08/2014 0644   GFRAA >90 07/08/2014 1007     Impression:  The patient is tolerating radiotherapy.   Plan:  Continue radiotherapy as planned.   Will increase Keppra to 750 BID to see if this helps muscle contractures further. Not  typical of a seizure. Will refer to neurology.  Myoclonus on differential.   Lengthy talk w/ pt and wife today re: prognosis and best vs worse case scenarios. They understand his prognosis is guarded and most patients die within 1-2 years.  However, we are hopeful he will defy these odds and he is tolerating therapy very well thus far.  Will refer to palliative medicine.   -----------------------------------  Eppie Gibson, MD

## 2014-09-05 NOTE — Progress Notes (Signed)
Refaxed clinical information to Coralyn Mark @ [phone] for patient's disability

## 2014-09-05 NOTE — Progress Notes (Signed)
Patient reports mild neck pain which he states he has following treatment due to positioning for treatments. He denies HA, dizziness, unsteady gait, loss of appetite. He does have slight nausea today, states his vision has not changed. He is taking Decadron 2 mg BID. Pt applies Biafine to scalp sporadically. Some erythema of scalp. No signs of thrush per pt.

## 2014-09-06 ENCOUNTER — Ambulatory Visit
Admission: RE | Admit: 2014-09-06 | Discharge: 2014-09-06 | Disposition: A | Discharge status: Home / Self Care | Payer: Commercial Managed Care - PPO | Source: Ambulatory Visit | Attending: Radiation Oncology | Admitting: Radiation Oncology

## 2014-09-06 DIAGNOSIS — Z510 Encounter for antineoplastic radiation therapy: Secondary | ICD-10-CM | POA: Diagnosis not present

## 2014-09-07 ENCOUNTER — Ambulatory Visit
Admission: RE | Admit: 2014-09-07 | Discharge: 2014-09-07 | Disposition: A | Discharge status: Home / Self Care | Payer: Commercial Managed Care - PPO | Source: Ambulatory Visit | Attending: Radiation Oncology | Admitting: Radiation Oncology

## 2014-09-07 DIAGNOSIS — Z510 Encounter for antineoplastic radiation therapy: Secondary | ICD-10-CM | POA: Diagnosis not present

## 2014-09-08 ENCOUNTER — Ambulatory Visit
Admission: RE | Admit: 2014-09-08 | Discharge: 2014-09-08 | Disposition: A | Discharge status: Home / Self Care | Payer: Commercial Managed Care - PPO | Source: Ambulatory Visit | Attending: Radiation Oncology | Admitting: Radiation Oncology

## 2014-09-08 ENCOUNTER — Other Ambulatory Visit (HOSPITAL_BASED_OUTPATIENT_CLINIC_OR_DEPARTMENT_OTHER): Payer: Commercial Managed Care - PPO

## 2014-09-08 DIAGNOSIS — C713 Malignant neoplasm of parietal lobe: Secondary | ICD-10-CM

## 2014-09-08 DIAGNOSIS — Z510 Encounter for antineoplastic radiation therapy: Secondary | ICD-10-CM | POA: Diagnosis not present

## 2014-09-08 DIAGNOSIS — C719 Malignant neoplasm of brain, unspecified: Secondary | ICD-10-CM

## 2014-09-08 LAB — CBC WITH DIFFERENTIAL/PLATELET
BASO%: 0.8 % (ref 0.0–2.0)
BASOS ABS: 0.1 10*3/uL (ref 0.0–0.1)
EOS%: 0.4 % (ref 0.0–7.0)
Eosinophils Absolute: 0 10*3/uL (ref 0.0–0.5)
HCT: 38.8 % (ref 38.4–49.9)
HEMOGLOBIN: 12.6 g/dL — AB (ref 13.0–17.1)
LYMPH#: 0.5 10*3/uL — AB (ref 0.9–3.3)
LYMPH%: 7.4 % — AB (ref 14.0–49.0)
MCH: 30.2 pg (ref 27.2–33.4)
MCHC: 32.5 g/dL (ref 32.0–36.0)
MCV: 92.7 fL (ref 79.3–98.0)
MONO#: 0.4 10*3/uL (ref 0.1–0.9)
MONO%: 6.3 % (ref 0.0–14.0)
NEUT#: 5.6 10*3/uL (ref 1.5–6.5)
NEUT%: 85.1 % — ABNORMAL HIGH (ref 39.0–75.0)
Platelets: 181 10*3/uL (ref 140–400)
RBC: 4.18 10*6/uL — ABNORMAL LOW (ref 4.20–5.82)
RDW: 17.7 % — AB (ref 11.0–14.6)
WBC: 6.6 10*3/uL (ref 4.0–10.3)

## 2014-09-08 LAB — COMPREHENSIVE METABOLIC PANEL (CC13)
ALBUMIN: 3.3 g/dL — AB (ref 3.5–5.0)
ALT: 41 U/L (ref 0–55)
ANION GAP: 8 meq/L (ref 3–11)
AST: 22 U/L (ref 5–34)
Alkaline Phosphatase: 58 U/L (ref 40–150)
BILIRUBIN TOTAL: 0.64 mg/dL (ref 0.20–1.20)
BUN: 27.8 mg/dL — ABNORMAL HIGH (ref 7.0–26.0)
CALCIUM: 8.8 mg/dL (ref 8.4–10.4)
CHLORIDE: 102 meq/L (ref 98–109)
CO2: 26 meq/L (ref 22–29)
Creatinine: 0.8 mg/dL (ref 0.7–1.3)
GLUCOSE: 96 mg/dL (ref 70–140)
POTASSIUM: 4.2 meq/L (ref 3.5–5.1)
SODIUM: 137 meq/L (ref 136–145)
Total Protein: 5.7 g/dL — ABNORMAL LOW (ref 6.4–8.3)

## 2014-09-08 LAB — TECHNOLOGIST REVIEW

## 2014-09-09 ENCOUNTER — Ambulatory Visit
Admission: RE | Admit: 2014-09-09 | Discharge: 2014-09-09 | Disposition: A | Discharge status: Home / Self Care | Payer: Commercial Managed Care - PPO | Source: Ambulatory Visit | Attending: Radiation Oncology | Admitting: Radiation Oncology

## 2014-09-09 DIAGNOSIS — Z510 Encounter for antineoplastic radiation therapy: Secondary | ICD-10-CM | POA: Diagnosis not present

## 2014-09-12 ENCOUNTER — Telehealth: Payer: Self-pay | Admitting: *Deleted

## 2014-09-12 ENCOUNTER — Ambulatory Visit
Admission: RE | Admit: 2014-09-12 | Discharge: 2014-09-12 | Disposition: A | Discharge status: Home / Self Care | Payer: Commercial Managed Care - PPO | Source: Ambulatory Visit | Attending: Radiation Oncology | Admitting: Radiation Oncology

## 2014-09-12 VITALS — BP 128/80 | HR 82 | Temp 97.8°F | Resp 16 | Ht 71.0 in | Wt 255.8 lb

## 2014-09-12 DIAGNOSIS — Z515 Encounter for palliative care: Secondary | ICD-10-CM

## 2014-09-12 DIAGNOSIS — Z510 Encounter for antineoplastic radiation therapy: Secondary | ICD-10-CM | POA: Diagnosis not present

## 2014-09-12 DIAGNOSIS — R530 Neoplastic (malignant) related fatigue: Secondary | ICD-10-CM

## 2014-09-12 DIAGNOSIS — Z7189 Other specified counseling: Secondary | ICD-10-CM

## 2014-09-12 DIAGNOSIS — C714 Malignant neoplasm of occipital lobe: Secondary | ICD-10-CM

## 2014-09-12 NOTE — Telephone Encounter (Signed)
Received call from Wadie Lessen, NP @ [facility] wanting to know re:  Pt will be completing radiation on Thurs. 09/15/14.  Pt has 5 tablets of Temodar left. Pt has f/u appt with Dr. Burr Medico on  09/22/14.   Stanton Kidney wanted to know what Dr. Burr Medico decides - pt has a break or continue with Temodar until office visit on 09/22/14.  Message to Dr. Burr Medico. [facility]  Phone     [phone].

## 2014-09-12 NOTE — Progress Notes (Signed)
Weekly Management Note:  outpatient    ICD-9-CM ICD-10-CM   1. Glioblastoma of occipital lobe 191.4 C71.4     Current Dose: 54Gy  Projected Dose: 60 Gy   Narrative:  The patient presents for routine under treatment assessment.  CBCT/MVCT images/Port film x-rays were reviewed.  The chart was checked. Doing well.  Jake [name] has completed 27 fractions to his brain.  He reports having pain in his neck and a slight headache after treatments.  He has been taking 1 norco tablet after treatment which relieves the pain.  He denies nausea, balance problems and dizziness.  He reports his vision depends on how much light is in the room.  He is taking decadron 2 mg bid.  He denies a sore throat or signs of thrush.  He is taking a probiotic.  Patient's skin on his scalp is red.  He is using biafine once daily. Muscle contractures are better with increase in Keppra and pedialyte. Neurology appt for this week (but he may ask for another time)  Physical Findings:  height is 5\' 11"  (1.803 m) and weight is 255 lb 12.8 oz (116.03 kg). His oral temperature is 97.8 F (36.6 C). His blood pressure is 128/80 and his pulse is 82. His respiration is 16 and oxygen saturation is 99%.  No thrush. Ambulatory. Scar on scalp intact. Skin   erythematous over scalp.  CBC    Component Value Date/Time   WBC 6.6 09/08/2014 1124   WBC 6.4 07/02/2014 1548   WBC 5.0 01/31/2013 1257   RBC 4.18* 09/08/2014 1124   RBC 5.07 07/02/2014 1548   RBC 4.46* 01/31/2013 1257   HGB 12.6* 09/08/2014 1124   HGB 15.2 07/02/2014 1548   HGB 13.1* 01/31/2013 1257   HCT 38.8 09/08/2014 1124   HCT 44.5 07/02/2014 1548   HCT 42.1* 01/31/2013 1257   PLT 181 09/08/2014 1124   PLT 198 07/02/2014 1548   MCV 92.7 09/08/2014 1124   MCV 87.8 07/02/2014 1548   MCV 94.3 01/31/2013 1257   MCH 30.2 09/08/2014 1124   MCH 30.0 07/02/2014 1548   MCH 29.4 01/31/2013 1257   MCHC 32.5 09/08/2014 1124   MCHC 34.2 07/02/2014 1548   MCHC 31.1*  01/31/2013 1257   RDW 17.7* 09/08/2014 1124   RDW 13.5 07/02/2014 1548   LYMPHSABS 0.5* 09/08/2014 1124   LYMPHSABS 1.5 07/02/2014 1548   MONOABS 0.4 09/08/2014 1124   MONOABS 0.5 07/02/2014 1548   EOSABS 0.0 09/08/2014 1124   EOSABS 0.0 07/02/2014 1548   BASOSABS 0.1 09/08/2014 1124   BASOSABS 0.0 07/02/2014 1548     CMP     Component Value Date/Time   NA 137 09/08/2014 1125   NA 134* 07/08/2014 0644   K 4.2 09/08/2014 1125   K 4.7 07/08/2014 0644   CL 99 07/08/2014 0644   CO2 26 09/08/2014 1125   CO2 23 07/08/2014 0644   GLUCOSE 96 09/08/2014 1125   GLUCOSE 100* 07/08/2014 0644   BUN 27.8* 09/08/2014 1125   BUN 26* 07/08/2014 0644   CREATININE 0.8 09/08/2014 1125   CREATININE 0.75 07/08/2014 0644   CREATININE 0.86 01/25/2014 1015   CALCIUM 8.8 09/08/2014 1125   CALCIUM 8.4 07/08/2014 0644   PROT 5.7* 09/08/2014 1125   PROT 6.9 07/02/2014 1548   ALBUMIN 3.3* 09/08/2014 1125   ALBUMIN 3.7 07/02/2014 1548   AST 22 09/08/2014 1125   AST 22 07/02/2014 1548   ALT 41 09/08/2014 1125   ALT 20  07/02/2014 1548   ALKPHOS 58 09/08/2014 1125   ALKPHOS 57 07/02/2014 1548   BILITOT 0.64 09/08/2014 1125   BILITOT 0.5 07/02/2014 1548   GFRNONAA >90 07/08/2014 0644   GFRAA >90 07/08/2014 6659     Impression:  The patient is tolerating radiotherapy.   Plan:  Continue radiotherapy as planned.   Pt saw palliative medicine today  Will Keep Decadron at 2mg  BID, but patient may try decreasing PM dose by 1mg  a couple weeks before his f/u.  Will f/u in 107mo with MRI. Sooner, if needed.  -----------------------------------  Eppie Gibson, MD

## 2014-09-12 NOTE — Consult Note (Signed)
Patient EX:HBZJIRC Mearns      DOB: 03-26-1956      VEL:[id number]     Consult Note from the Palliative Medicine Team at [facility] Requested by: Dr Tammi Klippel    PCP: [name], MD Reason for Consultation: Introduction to Palliative Medicine/Clarification of GOC and options                                                                                                           Phone                                                                                                                                                                                                Number:[phone]  Assessment of patients Current state: 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  S/P resection, chemotherapy and radiation.  He and his wife struggle with the emotional, physical and financial elements of living with a life limiting disease.    Consult is for introduction to the concept of Palliative Medicine, clarification of Advanced Directives,  holistic support and symptom management as indicated  This NP Wadie Lessen reviewed medical records, received report from team, assessed the patient and then meet with the patient and his wife Amiee in the radiation-outpatient oncology clinic.  A detailed discussion was had today regarding advanced directives.  Values and goals of care important to patient and family were attempted to be elicited.  Concept of Palliative Care was discussed  Questions and concerns addressed.  Patient and family encouraged to call with questions or concerns.  PMT will continue to support holistically.  Patient and his wife encouraged to continue today conversation with themselves and their health care providers in order to enhance patient centered care.   Goals of Care: 1.  Code Status:  Full code   2. Scope of Treatment:  At this time patient is open to all available and offered medcial interventions to prolong quality life.  He is  hopeful for improvement and the ability to return to work   4. Symptom Management:   1. Fatigue: -Pace yourself -Plan your day -Include naps and breaks -schedule  a relaxing day -get a little exercise -fuel the body -consider complementary therapies   -deep breathing   -prayer/medication   -guided meditation 2.  Insomnia: Decrease Decadron to 1 mg for bedtime dose                       Ativan 0.5 mg every 8 hrs prn 3.   Muscle cramps: patient to f/u with neurology in two weeks 4.   Pain:Vicodin 5-325 mg one tablet every 4 hrs prn 5.   Bowel Regimen:  Discussed utilization of Senna-s for opioid related constipation  5. Psychosocial:  Emotional support offered to patient and his wife.  Education regarding support group  6. Spiritual: [place]     Brief HPI: 59 yo male presented was headaches and sinus congestion in mid October 2015. He was seen by his primary care physician and was treated for sinus infection. He subsequently developed left sided visual deficit, left arm and leg weakness and unsteady gait. He was sent to emergency room by his primary care physician on 07/02/2014. CT And MRI of brain reviewed to add adjacent enhancing mass lesions in the right parietal lobe with central necrosis and extensive surrounding edema and mass effect. He was admitted and underwent right craniotomy For resection of the 2 brain masses by Dr. Lonell Grandchild. Postsurgical brain MRI revealed a 1.3 cm area of enhancement at the anterior margin of surgical resection cavity, likely surgical change. His case was reviewed in the tumor board and it was felt he had subtot  ROS:  Fatigue, previously reported vision deficits, muscle twitching   PMH:  Past Medical History  Diagnosis Date  . Thyroid disease   . Hypertension   . Allergy   . Arthritis   . Sleep apnea   . Kidney stone      PSH: Past Surgical History  Procedure Laterality Date  . Knee arthroscopy w/  meniscal repair    . Treatment fistula anal    . Craniotomy N/A 07/07/2014    Procedure: CRANIOTOMY TUMOR EXCISION/CURVE;  Surgeon: Elaina Hoops, MD;  Location: [facility];  Service: Neurosurgery;  Laterality: N/A;   I have reviewed the [place] and SH and  If appropriate update it with new information. No Known Allergies Scheduled Meds: Continuous Infusions: PRN Meds:.      There were no vitals taken for this visit.    No intake or output data in the 24 hours ending 09/12/14 1231     Physical Exam:  General: Chronically ill appearing, noted moon face HEENT:  Moist buccal membranes, no exudate Neuro: alert and oriented X3  Labs: CBC    Component Value Date/Time   WBC 6.6 09/08/2014 1124   WBC 6.4 07/02/2014 1548   WBC 5.0 01/31/2013 1257   RBC 4.18* 09/08/2014 1124   RBC 5.07 07/02/2014 1548   RBC 4.46* 01/31/2013 1257   HGB 12.6* 09/08/2014 1124   HGB 15.2 07/02/2014 1548   HGB 13.1* 01/31/2013 1257   HCT 38.8 09/08/2014 1124   HCT 44.5 07/02/2014 1548   HCT 42.1* 01/31/2013 1257   PLT 181 09/08/2014 1124   PLT 198 07/02/2014 1548   MCV 92.7 09/08/2014 1124   MCV 87.8 07/02/2014 1548   MCV 94.3 01/31/2013 1257   MCH 30.2 09/08/2014 1124   MCH 30.0 07/02/2014 1548   MCH 29.4 01/31/2013 1257   MCHC 32.5 09/08/2014 1124   MCHC 34.2 07/02/2014 1548   MCHC  31.1* 01/31/2013 1257   RDW 17.7* 09/08/2014 1124   RDW 13.5 07/02/2014 1548   LYMPHSABS 0.5* 09/08/2014 1124   LYMPHSABS 1.5 07/02/2014 1548   MONOABS 0.4 09/08/2014 1124   MONOABS 0.5 07/02/2014 1548   EOSABS 0.0 09/08/2014 1124   EOSABS 0.0 07/02/2014 1548   BASOSABS 0.1 09/08/2014 1124   BASOSABS 0.0 07/02/2014 1548    BMET    Component Value Date/Time   NA 137 09/08/2014 1125   NA 134* 07/08/2014 0644   K 4.2 09/08/2014 1125   K 4.7 07/08/2014 0644   CL 99 07/08/2014 0644   CO2 26 09/08/2014 1125   CO2 23 07/08/2014 0644   GLUCOSE 96 09/08/2014 1125   GLUCOSE 100* 07/08/2014 0644   BUN 27.8*  09/08/2014 1125   BUN 26* 07/08/2014 0644   CREATININE 0.8 09/08/2014 1125   CREATININE 0.75 07/08/2014 0644   CREATININE 0.86 01/25/2014 1015   CALCIUM 8.8 09/08/2014 1125   CALCIUM 8.4 07/08/2014 0644   GFRNONAA >90 07/08/2014 0644   GFRAA >90 07/08/2014 0644    CMP     Component Value Date/Time   NA 137 09/08/2014 1125   NA 134* 07/08/2014 0644   K 4.2 09/08/2014 1125   K 4.7 07/08/2014 0644   CL 99 07/08/2014 0644   CO2 26 09/08/2014 1125   CO2 23 07/08/2014 0644   GLUCOSE 96 09/08/2014 1125   GLUCOSE 100* 07/08/2014 0644   BUN 27.8* 09/08/2014 1125   BUN 26* 07/08/2014 0644   CREATININE 0.8 09/08/2014 1125   CREATININE 0.75 07/08/2014 0644   CREATININE 0.86 01/25/2014 1015   CALCIUM 8.8 09/08/2014 1125   CALCIUM 8.4 07/08/2014 0644   PROT 5.7* 09/08/2014 1125   PROT 6.9 07/02/2014 1548   ALBUMIN 3.3* 09/08/2014 1125   ALBUMIN 3.7 07/02/2014 1548   AST 22 09/08/2014 1125   AST 22 07/02/2014 1548   ALT 41 09/08/2014 1125   ALT 20 07/02/2014 1548   ALKPHOS 58 09/08/2014 1125   ALKPHOS 57 07/02/2014 1548   BILITOT 0.64 09/08/2014 1125   BILITOT 0.5 07/02/2014 1548   GFRNONAA >90 07/08/2014 0644   GFRAA >90 07/08/2014 0644   ECOG PERFORMANCE STATUS* (Eastern Cooperative Oncology Group)  0 Fully active, able to continue with all pre-disease activities without restriction. Pt score  1 Restricted in physically strenuous activity but ambulatory and able to carry out work of a light or sedentary nature, e.g., light house work, office work. 1  2 Ambulatory and capable of all self-care but unable to carry out any work activities. Up and about more than 50% of waking hours.    3 Capable of only limited self-care. Confined to bed or chair more than 50% of waking hours.   4 Completely disabled. Cannot carry on any self-care. Totally confined to bed or chair.   5 Dead.    As published in Am. J. Clin. Oncol.: Eustace Pen, M.M., Colon Flattery., [place], D.C., [place], Sharen Hint.,  Drexel Iha, P.P.: Toxicity And Response Criteria Of The [facility]. [place] 1:884-166, 1982.  The ECOG Performance Status is in the public domain therefore available for public use. To duplicate the scale, please cite the reference above and credit the [facility], Tyler Pita M.D., Group Chair    Time In Time Out Total Time Spent with Patient Total Overall Time  1115 1230 70 min 75 min    Greater than 50%  of this time was  spent counseling and coordinating care related to the above assessment and plan.   Wadie Lessen NP  Palliative Medicine Team Team Phone # [phone] Pager [phone]  Discussed with Dr Lanell Persons

## 2014-09-12 NOTE — Progress Notes (Signed)
Jake [name] has completed 27 fractions to his brain.  He reports having pain in his neck and a slight headache after treatments.  He has been taking 1 norco tablet after treatment which relieves the pain.  He denies nausea, balance problems and dizziness.  He reports his vision depends on how much light is in the room.  He is taking decadron 2 mg bid.  He denies a sore throat or signs of thrush.  He is taking a probiotic.  Patient's skin on his scalp is red.  He is using biafine once daily.

## 2014-09-12 NOTE — Telephone Encounter (Signed)
CALLED PATIENT TO INFORM OF APPT. WITH DR. Lanny Hurst WILLIS ON 09-14-14 - ARRIVAL TIME - 2:30 PM, SPOKE WITH PATIENT AND HE IS AWARE OF THIS APPT.

## 2014-09-13 ENCOUNTER — Ambulatory Visit
Admission: RE | Admit: 2014-09-13 | Discharge: 2014-09-13 | Disposition: A | Discharge status: Home / Self Care | Payer: Commercial Managed Care - PPO | Source: Ambulatory Visit | Attending: Radiation Oncology | Admitting: Radiation Oncology

## 2014-09-13 ENCOUNTER — Other Ambulatory Visit: Payer: Self-pay | Admitting: Radiation Therapy

## 2014-09-13 DIAGNOSIS — C714 Malignant neoplasm of occipital lobe: Secondary | ICD-10-CM

## 2014-09-13 DIAGNOSIS — Z510 Encounter for antineoplastic radiation therapy: Secondary | ICD-10-CM | POA: Diagnosis not present

## 2014-09-14 ENCOUNTER — Ambulatory Visit
Admission: RE | Admit: 2014-09-14 | Discharge: 2014-09-14 | Disposition: A | Discharge status: Home / Self Care | Payer: Commercial Managed Care - PPO | Source: Ambulatory Visit | Attending: Radiation Oncology | Admitting: Radiation Oncology

## 2014-09-14 ENCOUNTER — Ambulatory Visit: Payer: Commercial Managed Care - PPO | Admitting: Neurology

## 2014-09-14 DIAGNOSIS — Z510 Encounter for antineoplastic radiation therapy: Secondary | ICD-10-CM | POA: Diagnosis not present

## 2014-09-15 ENCOUNTER — Ambulatory Visit: Payer: Commercial Managed Care - PPO | Admitting: Hematology

## 2014-09-15 ENCOUNTER — Encounter: Payer: Self-pay | Admitting: Radiation Oncology

## 2014-09-15 ENCOUNTER — Encounter: Payer: Self-pay | Admitting: *Deleted

## 2014-09-15 ENCOUNTER — Ambulatory Visit
Admission: RE | Admit: 2014-09-15 | Discharge: 2014-09-15 | Disposition: A | Discharge status: Home / Self Care | Payer: Commercial Managed Care - PPO | Source: Ambulatory Visit | Attending: Radiation Oncology | Admitting: Radiation Oncology

## 2014-09-15 ENCOUNTER — Ambulatory Visit: Payer: Commercial Managed Care - PPO

## 2014-09-15 ENCOUNTER — Other Ambulatory Visit (HOSPITAL_BASED_OUTPATIENT_CLINIC_OR_DEPARTMENT_OTHER): Payer: Commercial Managed Care - PPO

## 2014-09-15 DIAGNOSIS — C719 Malignant neoplasm of brain, unspecified: Secondary | ICD-10-CM

## 2014-09-15 DIAGNOSIS — C713 Malignant neoplasm of parietal lobe: Secondary | ICD-10-CM

## 2014-09-15 DIAGNOSIS — Z510 Encounter for antineoplastic radiation therapy: Secondary | ICD-10-CM | POA: Diagnosis not present

## 2014-09-15 DIAGNOSIS — C714 Malignant neoplasm of occipital lobe: Secondary | ICD-10-CM

## 2014-09-15 LAB — COMPREHENSIVE METABOLIC PANEL (CC13)
ALK PHOS: 62 U/L (ref 40–150)
ALT: 44 U/L (ref 0–55)
ANION GAP: 8 meq/L (ref 3–11)
AST: 23 U/L (ref 5–34)
Albumin: 3.2 g/dL — ABNORMAL LOW (ref 3.5–5.0)
BUN: 25.2 mg/dL (ref 7.0–26.0)
CHLORIDE: 104 meq/L (ref 98–109)
CO2: 24 meq/L (ref 22–29)
Calcium: 8.3 mg/dL — ABNORMAL LOW (ref 8.4–10.4)
Creatinine: 1 mg/dL (ref 0.7–1.3)
EGFR: 87 mL/min/{1.73_m2} — ABNORMAL LOW (ref >90)
GLUCOSE: 115 mg/dL (ref 70–140)
POTASSIUM: 4.3 meq/L (ref 3.5–5.1)
Sodium: 137 mEq/L (ref 136–145)
Total Bilirubin: 0.59 mg/dL (ref 0.20–1.20)
Total Protein: 5.6 g/dL — ABNORMAL LOW (ref 6.4–8.3)

## 2014-09-15 LAB — CBC WITH DIFFERENTIAL/PLATELET
BASO%: 0.3 % (ref 0.0–2.0)
Basophils Absolute: 0 10*3/uL (ref 0.0–0.1)
EOS ABS: 0 10*3/uL (ref 0.0–0.5)
EOS%: 0.2 % (ref 0.0–7.0)
HCT: 38.4 % (ref 38.4–49.9)
HGB: 13 g/dL (ref 13.0–17.1)
LYMPH#: 0.4 10*3/uL — AB (ref 0.9–3.3)
LYMPH%: 6.9 % — AB (ref 14.0–49.0)
MCH: 31 pg (ref 27.2–33.4)
MCHC: 33.8 g/dL (ref 32.0–36.0)
MCV: 91.8 fL (ref 79.3–98.0)
MONO#: 0.3 10*3/uL (ref 0.1–0.9)
MONO%: 4.5 % (ref 0.0–14.0)
NEUT#: 5.5 10*3/uL (ref 1.5–6.5)
NEUT%: 88.1 % — AB (ref 39.0–75.0)
PLATELETS: 163 10*3/uL (ref 140–400)
RBC: 4.18 10*6/uL — ABNORMAL LOW (ref 4.20–5.82)
RDW: 17.9 % — ABNORMAL HIGH (ref 11.0–14.6)
WBC: 6.3 10*3/uL (ref 4.0–10.3)

## 2014-09-15 LAB — TECHNOLOGIST REVIEW

## 2014-09-15 NOTE — Progress Notes (Signed)
  Radiation Oncology         [phone] ________________________________  Name: Eriberto Felch MRN: [id number]  Date: 09/15/2014  DOB: Sep 25, 1955  End of Treatment Note  REFERRING PHYSICIAN: Elaina Hoops, MD  DIAGNOSIS: 191.4 / C71.4 Glioblastoma of parieto-occipital region   Indication for treatment: curative  (with Temodar)    Radiation treatment dates:   08/03/2014-09/15/2014  Site/dose:   1) Right occipito-parietal brain / 46 Gy in 23 fractions 2) Right brain boost / 14 Gy in 7 fractions  Beams/energy:    1) IMRT/VMAT / 6 MV 2) IMRT/VMAT / 6 MV  Narrative: The patient tolerated radiation treatment relatively well.   Her was started on Keppra for muscle contractures and referred to neurology for further assessment and management.   Plan: The patient has completed radiation treatment. The patient will return to radiation oncology clinic for routine followup in one month with a brain MRI. I advised them to call or return sooner if they have any questions or concerns related to their recovery or treatment.  -----------------------------------  Eppie Gibson, MD

## 2014-09-15 NOTE — Progress Notes (Signed)
[place] Work  Clinical Social Work was referred by Terese Door, NP for assessment of psychosocial needs due to possible financial concerns.  Clinical Social Worker met with patient and wife prior to radiation to offer support and assess for needs.  Pt and wife report they have met with financial counselor, Ailene Ravel and their application is pending. CSW left vm on behalf of pt to check on status of application. They are also in the process of applying for ss disability through pt's work. They found the [employer] very helpful and plan to attend next month's meeting. They are thankful to finish radiation today! They are aware to reach out to CSW as needed.   Clinical Social Work interventions: Resource assistance Pt advocacy  Loren Racer, [place] Worker [place]  [place] Phone: [phone] Fax: [phone]

## 2014-09-16 ENCOUNTER — Ambulatory Visit: Payer: Commercial Managed Care - PPO

## 2014-09-22 ENCOUNTER — Telehealth: Payer: Self-pay | Admitting: Hematology

## 2014-09-22 ENCOUNTER — Encounter: Payer: Self-pay | Admitting: Hematology

## 2014-09-22 ENCOUNTER — Encounter (HOSPITAL_COMMUNITY): Payer: Commercial Managed Care - PPO

## 2014-09-22 ENCOUNTER — Other Ambulatory Visit (HOSPITAL_BASED_OUTPATIENT_CLINIC_OR_DEPARTMENT_OTHER): Payer: Commercial Managed Care - PPO

## 2014-09-22 ENCOUNTER — Ambulatory Visit (HOSPITAL_COMMUNITY)
Admission: RE | Admit: 2014-09-22 | Discharge: 2014-09-22 | Disposition: A | Discharge status: Home / Self Care | Payer: Commercial Managed Care - PPO | Source: Ambulatory Visit | Attending: Hematology | Admitting: Hematology

## 2014-09-22 ENCOUNTER — Telehealth: Payer: Self-pay | Admitting: *Deleted

## 2014-09-22 ENCOUNTER — Ambulatory Visit (HOSPITAL_BASED_OUTPATIENT_CLINIC_OR_DEPARTMENT_OTHER): Payer: Commercial Managed Care - PPO | Admitting: Hematology

## 2014-09-22 VITALS — BP 135/71 | HR 84 | Temp 97.4°F | Resp 18 | Ht 71.0 in | Wt 256.9 lb

## 2014-09-22 DIAGNOSIS — M7989 Other specified soft tissue disorders: Secondary | ICD-10-CM

## 2014-09-22 DIAGNOSIS — C719 Malignant neoplasm of brain, unspecified: Secondary | ICD-10-CM

## 2014-09-22 DIAGNOSIS — C714 Malignant neoplasm of occipital lobe: Secondary | ICD-10-CM

## 2014-09-22 DIAGNOSIS — R609 Edema, unspecified: Secondary | ICD-10-CM | POA: Diagnosis present

## 2014-09-22 DIAGNOSIS — C713 Malignant neoplasm of parietal lobe: Secondary | ICD-10-CM

## 2014-09-22 LAB — COMPREHENSIVE METABOLIC PANEL (CC13)
ALT: 47 U/L (ref 0–55)
AST: 24 U/L (ref 5–34)
Albumin: 3.3 g/dL — ABNORMAL LOW (ref 3.5–5.0)
Alkaline Phosphatase: 57 U/L (ref 40–150)
Anion Gap: 8 mEq/L (ref 3–11)
BUN: 20.3 mg/dL (ref 7.0–26.0)
CALCIUM: 8.9 mg/dL (ref 8.4–10.4)
CHLORIDE: 101 meq/L (ref 98–109)
CO2: 28 mEq/L (ref 22–29)
CREATININE: 0.8 mg/dL (ref 0.7–1.3)
EGFR: >90 mL/min/{1.73_m2} (ref >90)
Glucose: 97 mg/dl (ref 70–140)
Potassium: 4.7 mEq/L (ref 3.5–5.1)
SODIUM: 137 meq/L (ref 136–145)
TOTAL PROTEIN: 5.8 g/dL — AB (ref 6.4–8.3)
Total Bilirubin: 0.66 mg/dL (ref 0.20–1.20)

## 2014-09-22 LAB — CBC WITH DIFFERENTIAL/PLATELET
BASO%: 0.5 % (ref 0.0–2.0)
BASOS ABS: 0 10*3/uL (ref 0.0–0.1)
EOS%: 0.3 % (ref 0.0–7.0)
Eosinophils Absolute: 0 10*3/uL (ref 0.0–0.5)
HEMATOCRIT: 38.8 % (ref 38.4–49.9)
HGB: 12.6 g/dL — ABNORMAL LOW (ref 13.0–17.1)
LYMPH%: 9.8 % — ABNORMAL LOW (ref 14.0–49.0)
MCH: 30.3 pg (ref 27.2–33.4)
MCHC: 32.6 g/dL (ref 32.0–36.0)
MCV: 92.8 fL (ref 79.3–98.0)
MONO#: 0.3 10*3/uL (ref 0.1–0.9)
MONO%: 6.2 % (ref 0.0–14.0)
NEUT#: 4.3 10*3/uL (ref 1.5–6.5)
NEUT%: 83.2 % — ABNORMAL HIGH (ref 39.0–75.0)
Platelets: 161 10*3/uL (ref 140–400)
RBC: 4.18 10*6/uL — AB (ref 4.20–5.82)
RDW: 18.4 % — AB (ref 11.0–14.6)
WBC: 5.2 10*3/uL (ref 4.0–10.3)
lymph#: 0.5 10*3/uL — ABNORMAL LOW (ref 0.9–3.3)

## 2014-09-22 LAB — TECHNOLOGIST REVIEW

## 2014-09-22 MED ORDER — TEMOZOLOMIDE 180 MG PO CAPS: 150.0000 mg/m2/d | ORAL_CAPSULE | Freq: Every day | ORAL | Status: AC

## 2014-09-22 MED ORDER — LORAZEPAM 0.5 MG PO TABS: 1.0000 mg | ORAL_TABLET | Freq: Three times a day (TID) | ORAL | Status: DC | PRN

## 2014-09-22 MED ORDER — FUROSEMIDE 20 MG PO TABS: 20.0000 mg | ORAL_TABLET | ORAL | Status: DC

## 2014-09-22 MED ORDER — POTASSIUM CHLORIDE CRYS ER 20 MEQ PO TBCR: 20.0000 meq | EXTENDED_RELEASE_TABLET | Freq: Two times a day (BID) | ORAL | Status: DC

## 2014-09-22 NOTE — Progress Notes (Signed)
[facility] NOTE  Patient Care Team: [name], MD as PCP - General (Family Medicine) Elaina Hoops, MD as Consulting Physician (Neurosurgery) Eppie Gibson, MD as Attending Physician (Radiation Oncology) Truitt Merle, MD as Consulting Physician (Hematology)     Glioblastoma of occipital lobe   07/02/2014 Imaging Brain MRI: Two adjacent enhancing mass lesions in the right parietal lobe with central necrosis and extensive surrounding edema and mass effect.  7 mm midline shift to the left.  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   CT CAP(-)   07/07/2014 Surgery Subtotal right parietal tumors resection     GBM (glioblastoma multiforme)   07/22/2014 Initial Diagnosis GBM (glioblastoma multiforme)    CURRENT THERAPY: Adjuvant chemo Temodar 150mg /m2, starting 09/29/2014  CHIEF COMPLAINTS: follow up GBM   HISTORY OF INITIAL PRESENTATION;   He initially presented was headaches and sinus congestion in mid October 2015. He was seen by his primary care physician and was treated for sinus infection. He subsequently developed left sided visual deficit, left arm and leg weakness and unsteady gait. He was sent to emergency room by his primary care physician on 07/02/2014. CT  And MRI of brain reviewed to add adjacent enhancing mass lesions in the right parietal lobe with central necrosis and extensive surrounding edema and mass effect. He was admitted and underwent right craniotomy  For resection of the 2 brain masses by Dr. Lonell Grandchild. Postsurgical brain MRI revealed a 1.3 cm area of enhancement at the anterior margin of surgical resection cavity, likely surgical change. His case was reviewed in the tumor board and it was felt he had subtotal resection.  INTERVAL HISTROY: Alvino returns for follow-up. He has completed his concurrent chemoradiation 1 week ago. He was more fatigued during the last week of treatment, but still able to function well. She continues gaining  weight, and notice more bilateral leg swelling. No fever, nausea, or other new symptoms.   MEDICAL HISTORY:  Past Medical History  Diagnosis Date  . Thyroid disease   . Hypertension   . Allergy   . Arthritis   .    Marland Kitchen Kidney stone     SURGICAL HISTORY: Past Surgical History  Procedure Laterality Date  . Knee arthroscopy w/ meniscal repair    . Treatment fistula anal    . 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 Marital Status: Married    Spouse Name: N/A    Number of Children: 5 children, age of 74-21  . Years of Education: N/A   Occupational History  . 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    Social History Main Topics  . Smoking status: Current Every Day Smoker -- 1.00 packs/day for 45 years    Types: Cigarettes  . Smokeless tobacco: Never Used  . Alcohol Use: Yes     Comment: 2-3 beers per/day   . Drug Use: No  . Sexual Activity: Not on file     FAMILY HISTORY: Family History  Problem Relation Age of Onset  . Hypertension Mother   . Diabetes Father   . Glaucoma Father   . Diabetes Brother   . Diabetes Brother   . Colon cancer Neg Hx   Paternal ancle had prostate cancer. No other family history  of malignancy.  ALLERGIES:  has No Known Allergies.   MEDICATIONS:  Current Outpatient Prescriptions  Medication Sig Dispense Refill  . b complex vitamins tablet Take 1 tablet by mouth daily.    Marland Kitchen CALCIUM-MAG-VIT C-VIT D PO Take 1 tablet by mouth at bedtime. Trying-started 09/21/13    . cyclobenzaprine (FLEXERIL) 10 MG tablet Take 1 tablet up to TID PRN. 90 tablet 2  . dexamethasone (DECADRON) 2 MG tablet Take 1 tablet (2 mg total) by mouth 2 (two) times daily. 100 tablet 0  . esomeprazole (NEXIUM) 20 MG capsule Take 1 capsule (20 mg total) by mouth daily  at 12 noon. 30 capsule 1  . eszopiclone (LUNESTA) 2 MG TABS tablet Take 1 tablet (2 mg total) by mouth at bedtime as needed for sleep. Take immediately before bedtime 30 tablet 0  . HYDROcodone-acetaminophen (NORCO/VICODIN) 5-325 MG per tablet Take 1 tablet by mouth every 4 (four) hours as needed for moderate pain. 90 tablet 0  . levETIRAcetam (KEPPRA) 750 MG tablet Take 1 tablet (750 mg total) by mouth 2 (two) times daily. 60 tablet 0  . levothyroxine (SYNTHROID, LEVOTHROID) 50 MCG tablet Take 50 mcg by mouth daily before breakfast. TAKE ONE TABLET BY MOUTH EVERY DAY    . lisinopril (PRINIVIL,ZESTRIL) 40 MG tablet Take 40 mg by mouth daily. TAKE ONE TABLET BY MOUTH EVERY DAY    . LORazepam (ATIVAN) 0.5 MG tablet Take 2 tablets (1 mg total) by mouth every 8 (eight) hours as needed for anxiety or sleep. 60 tablet 1  . magnesium 30 MG tablet Take 30 mg by mouth daily.     . Multiple Vitamins-Minerals (MULTIVITAMIN WITH MINERALS) tablet Take 1 tablet by mouth daily.    . ondansetron (ZOFRAN) 8 MG tablet Take 1 tablet (8 mg total) by mouth every 8 (eight) hours as needed for nausea or vomiting. 30 tablet 4  . Probiotic Product (PROBIOTIC DAILY PO) Take by mouth daily.     Marland Kitchen sulfamethoxazole-trimethoprim (BACTRIM DS) 800-160 MG per tablet Take 1 tablet by mouth 3 (three) times a week. 14 tablet 6  . verapamil (COVERA HS) 180 MG (CO) 24 hr tablet Take 180 mg by mouth at bedtime.    . Vitamin D-Vitamin K (VITAMIN K2-VITAMIN D3) 45-2000 MCG-UNIT CAPS Take by mouth daily.     No current facility-administered medications for this visit.    REVIEW OF SYSTEMS:   Constitutional: Denies fevers, chills or abnormal night sweats Eyes: Denies blurriness of vision, double vision or watery eyes Ears, nose, mouth, throat, and face: Denies mucositis or sore throat Respiratory: Denies cough, dyspnea or wheezes Cardiovascular: Denies palpitation, chest discomfort or lower extremity swelling Gastrointestinal:  Denies  nausea, heartburn or change in bowel habits Skin: Denies abnormal skin rashes Lymphatics: Denies new lymphadenopathy or easy bruising Neurological:positive for mild headaches, and left side vision deficits. His left-sided weakness has nearly resolved. Denies numbness, tingling or new weaknesses Behavioral/Psych: Mood is stable, no new changes  All other systems were reviewed with the patient and are negative.  PHYSICAL EXAMINATION: ECOG PERFORMANCE STATUS: 1 - Symptomatic but completely ambulatory  KPS: 90%  Filed Vitals:   09/22/14 1057  BP: 135/71  Pulse: 84  Temp: 97.4 F (36.3 C)  Resp: 18   Filed Weights   09/22/14 1057  Weight: 256 lb 14.4 oz (116.529 kg)    GENERAL:alert, no distress and comfortable SKIN: skin color, texture, turgor are normal, no rashes or significant lesions HEAD: right-sided posterior craniotomy surgical wound  is healing well, no surrounding edema, skin redness or discharge EYES: normal, conjunctiva are pink and non-injected, sclera clear OROPHARYNX:no exudate, no erythema and lips, buccal mucosa, and tongue normal  NECK: supple, thyroid normal size, non-tender, without nodularity LYMPH:  no palpable lymphadenopathy in the cervical, axillary or inguinal LUNGS: clear to auscultation and percussion with normal breathing effort HEART: regular rate & rhythm and no murmurs and no lower extremity edema ABDOMEN:abdomen soft, non-tender and normal bowel sounds Musculoskeletal:no cyanosis of digits and no clubbing  PSYCH: alert & oriented x 3 with fluent speech NEURO: cranial nerve 2-12 are intact. Left side vision deficits is pressure in the left upper and mid quadrant. Motor and sensitivity are normal and symmetric on all extremities. Speech is fluent, coordination is grossly intact. Ambulates independently and Lutricia Feil sign negative. EXT: Bilateral pitting edema up to knee.  LABORATORY DATA:  I have reviewed the data as listed Lab Results  Component  Value Date   WBC 5.2 09/22/2014   HGB 12.6* 09/22/2014   HCT 38.8 09/22/2014   MCV 92.8 09/22/2014   PLT 161 09/22/2014    Recent Labs  07/02/14 1518 07/02/14 1548 07/08/14 0644  09/08/14 1125 09/15/14 1044 09/22/14 1039  NA 136* 136* 134*  < > 137 137 137  K 4.0 4.1 4.7  < > 4.2 4.3 4.7  CL 102 100 99  --   --   --   --   CO2  --  24 23  < > 26 24 28   GLUCOSE 102* 96 100*  < > 96 115 97  BUN 12 12 26*  < > 27.8* 25.2 20.3  CREATININE 0.80 0.85 0.75  < > 0.8 1.0 0.8  CALCIUM  --  9.3 8.4  < > 8.8 8.3* 8.9  GFRNONAA  --  >90 >90  --   --   --   --   GFRAA  --  >90 >90  --   --   --   --   PROT  --  6.9  --   < > 5.7* 5.6* 5.8*  ALBUMIN  --  3.7  --   < > 3.3* 3.2* 3.3*  AST  --  22  --   < > 22 23 24   ALT  --  20  --   < > 41 44 47  ALKPHOS  --  57  --   < > 58 62 57  BILITOT  --  0.5  --   < > 0.64 0.59 0.66  < > = values in this interval not displayed.  Surgical path 07/07/2014  Diagnosis 1. Brain, biopsy - GLIOBLASTOMA MULTIFORME, WHO GRADE IV/IV. - SEE ONCOLOGY TABLE BELOW. 2. Brain, for tumor resection, higher right parietal - GLIOBLASTOMA MULTIFORME, WHO GRADE IV/IV. - SEE ONCOLOGY TABLE BELOW. 3. Brain, for tumor resection, lower right occipital - GLIOBLASTOMA MULTIFORME, WHO GRADE IV/IV   Microscopic Comment 1. -3. ONCOLOGY TABLE - BRAIN AND SPINAL CORD 1. Procedure: Resection x2 2. Tumor site, including laterality: Right parietal and right occipital 3. Maximum tumor size (cm): At least 1.8 cm 4. Histologic type: Glioblastoma multiforme 5. Grade: WHO Grade IV/IV 6. Margins (if applicable): Can not be assessed 7. Ancillary studies: Can be performed upon clinician request.  RADIOGRAPHIC STUDIES: I have personally reviewed the radiological images as listed and agreed with the findings in the report.  [name] Contrast 07/08/2014    IMPRESSION:  1. Postoperative changes from interval right occipital craniotomy  with resection of right parieto-occipital  mass lesions.  2. 1.3 x 0.5 x 0.5 cm area of enhancement at the anterior margin of the more superior resection cavity in the right parieto-occipital region. It is unclear whether this reflects residual tumor or possibly normal enhancement of the choroid plexus in the adjacent compressed occipital horn of the right lateral ventricle. Attention on follow-up recommended.  3. Fairly linear enhancement about the more inferior resection cavity without definite evidence of residual tumor.  4. Small amount of associated postoperative infarct at the medial aspect of the more superior resection site.  5. Stable 6 mm focus of restricted diffusion in the left frontal lobe, compatible with an acute or subacute infarct.  6. Slightly improved vasogenic edema within the right parieto-occipital region with persistent 7 mm of right-to-left midline shift.     Electronically Signed   By: Jeannine Boga M.D.   On: 07/08/2014 23:45   Mr Brain W GG Contrast  IMPRESSION:  Two adjacent enhancing mass lesions in the right parietal lobe with central necrosis and extensive surrounding edema and mass effect. Hyperintensity extends into the splenium of the corpus callosum. 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 Metastatic disease is a less likely consideration. 7 mm midline shift to the left.  Subcentimeter focus of restricted diffusion in the left frontal lobe likely an area of acute or subacute infarction.  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      ASSESSMENT & PLAN:  Jake [name] is a 59 year old gentleman with newly diagnosed right parietal GBM, status post subtotal resection. He has recovered very well, except mild headaches and left-sided vision deficits. He has finished concurrent chemoradiation or week ago.  1. GBM, s/p subtotal resection, and concurrent chemoRT completed on 09/15/2014 --He is clinically doing well, recovering from his concurrent chemoradiation. -We again  discussed adjuvant Temodar for total of 6 months. I'll start him on Temodar  150 mg/m on day 1-5 every 28 day cycle one week later. If he tolerates well, we'll increase to 200 mg/m from second cycle.  -cont bactrim DS 1 tab every M, W, F for PCP prophylaxis.  -cont Nexium daily when he is on steroids, vitamin the vitamin D and calcium, and a regular exercise.  2. Bilateral leg swelling -This is likely related to steroids -Giving the high risk of DVT in patients with GBM, I'll obtain a Doppler ultrasound of his legs today. It was done after his clinical visit and was negative. -lasix 20mg  every other day for 10 days, with potassium 20 mEq daily. -I'll discuss with Dr. Camelia Eng to see if she agrees to taper off the steroids sooner.    Plan: -start temodar in one week, will send to [facility] today  -RTC oin 4 weeks 2/9    All questions were answered. The patient knows to call the clinic with any problems, questions or concerns. I spent 20 minutes counseling the patient face to face. The total time spent in the appointment was 30 minutes and more than 50% was on counseling.     Truitt Merle, MD 09/22/2014 11:26 PM

## 2014-09-22 NOTE — Telephone Encounter (Signed)
Received call from [facility] reporting that bilater venous dopplar negative for DVT & pt sent home.  Reported to Dr Burr Medico.

## 2014-09-22 NOTE — Progress Notes (Signed)
*  PRELIMINARY RESULTS* Vascular Ultrasound Bilateral lower extremity venous duplex has been completed.  Preliminary findings: No evidence of DVT   Called results to [phone].   Landry Mellow, RDMS, RVT  09/22/2014, 1:33 PM

## 2014-09-22 NOTE — Telephone Encounter (Signed)
Confirm appt for February.

## 2014-09-23 ENCOUNTER — Ambulatory Visit: Payer: Commercial Managed Care - PPO | Admitting: Neurology

## 2014-09-26 ENCOUNTER — Telehealth: Payer: Self-pay | Admitting: *Deleted

## 2014-09-26 NOTE — Telephone Encounter (Signed)
Per Dr Pearlie Oyster instruction, called patient and informed him of steroid taper. He stated he was writing down his taper as follows: per Dr Isidore Moos, starting today, take Dexamethasone 2 mg daily, then on Feb 8th begin taking Dexamethasone 1 mg daily. Advised patient to inform this office immediately if he develops neurological symptoms such as headache, new pain, nausea, vision changes, dizziness, unsteadiness or any other problems. Gave patient rad onc nursing station's direct phone number. Patient verbalized understanding, agreement.

## 2014-09-26 NOTE — Progress Notes (Signed)
IMRT DEVICE NOTE 08-03-14  I approved the IMRT treatment device. The code is [phone].   IMRT planning Note  3 VMAT arcs will be used for his initial treatment to 46Gy/23 fractions.  This will be followed by a boost of 14Gy /7 fractions using 2 VMAT arcs.  IMRT is an important modality to deliver adequate dose to the patient's at risk tissues while sparing the patient's normal structures, including the:    Brainstem, optic structures (lenses, globes) and brain tissue. This justifies the use of IMRT in the patient's treatment.  -----------------------------------  Eppie Gibson, MD

## 2014-09-27 ENCOUNTER — Other Ambulatory Visit: Payer: Self-pay | Admitting: Hematology

## 2014-09-27 ENCOUNTER — Telehealth: Payer: Self-pay | Admitting: Radiation Therapy

## 2014-09-27 NOTE — Telephone Encounter (Signed)
pt left a voicemail asking when he is supposed to resume his Temodar. I called back and let him know that Dr. Ernestina Penna note indicates that start date should be 09/29/14.

## 2014-09-29 ENCOUNTER — Ambulatory Visit (INDEPENDENT_AMBULATORY_CARE_PROVIDER_SITE_OTHER): Payer: Commercial Managed Care - PPO | Admitting: Neurology

## 2014-09-29 ENCOUNTER — Encounter: Payer: Self-pay | Admitting: Neurology

## 2014-09-29 VITALS — BP 118/76 | HR 93 | Ht 71.0 in | Wt 261.8 lb

## 2014-09-29 DIAGNOSIS — G4762 Sleep related leg cramps: Secondary | ICD-10-CM

## 2014-09-29 DIAGNOSIS — C714 Malignant neoplasm of occipital lobe: Secondary | ICD-10-CM

## 2014-09-29 HISTORY — DX: Sleep related leg cramps: G47.62

## 2014-09-29 MED ORDER — BACLOFEN 10 MG PO TABS: 10.0000 mg | ORAL_TABLET | Freq: Every day | ORAL | Status: DC

## 2014-09-29 NOTE — Progress Notes (Signed)
Reason for visit: Arm and leg cramps  Jake [name] is a 59 y.o. male  History of present illness:  Mr. 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 The patient presented with what he felt were sinus headaches. He was noted to have some left arm weakness, and MRI of the evaluation revealed a glioblastoma. He has been on steroid treatment, and he has received chemotherapy with Temodar, and radiation. The patient began developing cramps involving the arms and legs on either side within the last 3 weeks. He is on Keppra for seizure prophylaxis. The muscle cramps occur within 2 hours after going to sleep. He may have one arm or one leg involved, sometimes 2 extremities at one time. There is no jerking or confusion associated with the events. The arms may go into flexion, and the thumb will going to flexion. The cramps are painful, and the patient has to stretch out the muscle, and he may have soreness for 10 or 20 minutes following the event. The cramps in the legs involve the gastrocnemius muscle, and may turn the foot in. The patient has had blood work done recently that shows normal electrolytes. He is on a Decadron taper. He does take magnesium supplementation, but only 30 mg daily. He comes to this office for further evaluation. He has not noted any new numbness or weakness of the extremities, no balance changes or problems controlling the bowels or the bladder. He does have some constipation issues. He has a left homonymous visual field deficit from the glioblastoma.  Past Medical History  Diagnosis Date  . Thyroid disease   . Hypertension   . Allergy   . Arthritis   . Sleep apnea   . Kidney stone   . Glioblastoma multiforme of occipital lobe   . Nocturnal leg cramps 09/29/2014    Past Surgical History  Procedure Laterality Date  . Knee arthroscopy w/ meniscal repair    . Treatment  fistula anal    . Craniotomy N/A 07/07/2014    Procedure: CRANIOTOMY TUMOR EXCISION/CURVE;  Surgeon: Elaina Hoops, MD;  Location: [facility];  Service: Neurosurgery;  Laterality: N/A;    Family History  Problem Relation Age of Onset  . Hypertension Mother   . Diabetes Father   . Glaucoma Father   . Diabetes Brother   . Diabetes Brother   . Colon cancer Neg Hx     Social history:  reports that he has been smoking Cigarettes.  He has a 45 pack-year smoking history. He has never used smokeless tobacco. He reports that he drinks alcohol. He reports that he does not use illicit drugs.  Medications:  Current Outpatient Prescriptions on File Prior to Visit  Medication Sig Dispense Refill  . b complex vitamins tablet Take 1 tablet by mouth daily.    Marland Kitchen CALCIUM-MAG-VIT C-VIT D PO Take 1 tablet by mouth at bedtime. Trying-started 09/21/13    . cyclobenzaprine (FLEXERIL) 10 MG tablet Take 1 tablet up to TID PRN. 90 tablet 2  . dexamethasone (DECADRON) 2 MG tablet Take 1 tablet (2 mg total) by mouth 2 (two) times daily. (Patient taking differently: Take 2 mg by mouth daily. ) 100 tablet 0  . esomeprazole (NEXIUM) 20 MG capsule Take 1 capsule (20 mg total) by mouth daily at 12 noon. 30 capsule 1  . furosemide (LASIX) 20 MG tablet Take 1 tablet (20 mg  total) by mouth every other day. 10 tablet 1  . HYDROcodone-acetaminophen (NORCO/VICODIN) 5-325 MG per tablet Take 1 tablet by mouth every 4 (four) hours as needed for moderate pain. 90 tablet 0  . levETIRAcetam (KEPPRA) 750 MG tablet Take 1 tablet (750 mg total) by mouth 2 (two) times daily. 60 tablet 0  . levothyroxine (SYNTHROID, LEVOTHROID) 50 MCG tablet Take 50 mcg by mouth daily before breakfast. TAKE ONE TABLET BY MOUTH EVERY DAY    . lisinopril (PRINIVIL,ZESTRIL) 40 MG tablet Take 40 mg by mouth daily. TAKE ONE TABLET BY MOUTH EVERY DAY    . LORazepam (ATIVAN) 0.5 MG tablet Take 2 tablets (1 mg total) by mouth every 8 (eight) hours as needed for  anxiety or sleep. 60 tablet 1  . Multiple Vitamins-Minerals (MULTIVITAMIN WITH MINERALS) tablet Take 1 tablet by mouth daily.    . ondansetron (ZOFRAN) 8 MG tablet Take 1 tablet (8 mg total) by mouth every 8 (eight) hours as needed for nausea or vomiting. 30 tablet 4  . potassium chloride SA (K-DUR,KLOR-CON) 20 MEQ tablet Take 1 tablet (20 mEq total) by mouth 2 (two) times daily. 10 tablet 1  . Probiotic Product (PROBIOTIC DAILY PO) Take by mouth daily.     Marland Kitchen sulfamethoxazole-trimethoprim (BACTRIM DS) 800-160 MG per tablet Take 1 tablet by mouth 3 (three) times a week. 14 tablet 6  . [START ON 10/03/2014] temozolomide (TEMODAR) 180 MG capsule Take 2 capsules (360 mg total) by mouth daily. May take on an empty stomach or at bedtime to decrease nausea & vomiting. 10 capsule 5  . verapamil (COVERA HS) 180 MG (CO) 24 hr tablet Take 180 mg by mouth at bedtime.    . Vitamin D-Vitamin K (VITAMIN K2-VITAMIN D3) 45-2000 MCG-UNIT CAPS Take by mouth daily.     No current facility-administered medications on file prior to visit.     No Known Allergies  ROS:  Out of a complete 14 system review of symptoms, the patient complains only of the following symptoms, and all other reviewed systems are negative.  Weight gain, fatigue Swelling in the legs Blurred vision, loss of vision Snoring Constipation Joint pain, muscle cramps Skin sensitivity Headache, weakness Anxiety, not enough sleep, decreased energy Insomnia, sleepiness, snoring  Blood pressure 118/76, pulse 93, height 5\' 11"  (1.803 m), weight 261 lb 12.8 oz (118.752 kg).  Physical Exam  General: The patient is alert and cooperative at the time of the examination. The patient is moderately to markedly obese.  Eyes: Pupils are equal, round, and reactive to light. Discs are flat bilaterally.  Neck: The neck is supple, no carotid bruits are noted.  Respiratory: The respiratory examination is clear.  Cardiovascular: The cardiovascular  examination reveals a regular rate and rhythm, no obvious murmurs or rubs are noted.  Skin: Extremities are with 3+ edema below the knees bilaterally.  Neurologic Exam  Mental status: The patient is alert and oriented x 3 at the time of the examination. The patient has apparent normal recent and remote memory, with an apparently normal attention span and concentration ability.  Cranial nerves: Facial symmetry is present. There is good sensation of the face to pinprick and soft touch bilaterally. The strength of the facial muscles and the muscles to head turning and shoulder shrug are normal bilaterally. Speech is well enunciated, no aphasia or dysarthria is noted. Extraocular movements are full. Visual fields are notable for left homonymous visual field deficit. The tongue is midline, and the patient has symmetric elevation  of the soft palate. No obvious hearing deficits are noted.  Motor: The motor testing reveals 5 over 5 strength of all 4 extremities. Good symmetric motor tone is noted throughout.  Sensory: Sensory testing is intact to pinprick, soft touch, vibration sensation, and position sense on all 4 extremities. No evidence of extinction is noted.  Coordination: Cerebellar testing reveals good finger-nose-finger and heel-to-shin bilaterally.  Gait and station: Gait is normal. Tandem gait is slightly unsteady. Romberg is negative. No drift is seen.  Reflexes: Deep tendon reflexes are symmetric and normal bilaterally. Toes are downgoing bilaterally.   MRI brain 07/08/14:  IMPRESSION: 1. Postoperative changes from interval right occipital craniotomy with resection of right parieto-occipital mass lesions. 2. 1.3 x 0.5 x 0.5 cm area of enhancement at the anterior margin of the more superior resection cavity in the right parieto-occipital region. It is unclear whether this reflects residual tumor or possibly normal enhancement of the choroid plexus in the adjacent compressed occipital  horn of the right lateral ventricle. Attention on follow-up recommended. 3. Fairly linear enhancement about the more inferior resection cavity without definite evidence of residual tumor. 4. Small amount of associated postoperative infarct at the medial aspect of the more superior resection site. 5. Stable 6 mm focus of restricted diffusion in the left frontal lobe, compatible with an acute or subacute infarct. 6. Slightly improved vasogenic edema within the right parieto-occipital region with persistent 7 mm of right-to-left midline shift.   Assessment/Plan:  1. Nocturnal leg cramps  2. Glioblastoma, right occipital lobe  The patient is having muscle cramps involving the arms and legs following onset of sleep. The patient will increase the magnesium to 400 mg daily of magnesium oxide, and he will be placed on baclofen taking 10 mg at night. If this regimen is not effective, quinine can be used in the future. The patient otherwise will follow-up in 3 or 4 months. He will call our office if he continues to have cramping.   Jill Alexanders MD 09/29/2014 7:48 PM  [facility] [address] [place],  [address]  Phone [phone] Fax [phone]

## 2014-09-29 NOTE — Patient Instructions (Addendum)
  Try going up on the magnesium taking 400 mg of magnesium oxide daily. Look out for diarrhea. We will add baclofen to the regimen, taking 10 mg at night. Call our office if the treatment is not fully effective.  Leg Cramps Leg cramps that occur during exercise can be caused by poor circulation or dehydration. However, muscle cramps that occur at rest or during the night are usually not due to any serious medical problem. Heat cramps may cause muscle spasms during hot weather.  CAUSES There is no clear cause for muscle cramps. However, dehydration may be a factor for those who do not drink enough fluids and those who exercise in the heat. Imbalances in the level of sodium, potassium, calcium or magnesium in the muscle tissue may also be a factor. Some medications, such as water pills (diuretics), may cause loss of chemicals that the body needs (like sodium and potassium) and cause muscle cramps. TREATMENT   Make sure your diet has enough fluids and essential minerals for the muscle to work normally.  Avoid strenuous exercise for several days if you have been having frequent leg cramps.  Stretch and massage the cramped muscle for several minutes.  Some medicines may be helpful in some patients with night cramps. Only take over-the-counter or prescription medicines as directed by your caregiver. SEEK IMMEDIATE MEDICAL CARE IF:   Your leg cramps become worse.  Your foot becomes cold, numb, or blue. Document Released: 09/26/2004 Document Revised: 11/11/2011 Document Reviewed: 09/13/2008 [facility] Patient Information 2015 [place], [place]. This information is not intended to replace advice given to you by your health care provider. Make sure you discuss any questions you have with your health care provider.

## 2014-10-04 DIAGNOSIS — R530 Neoplastic (malignant) related fatigue: Secondary | ICD-10-CM | POA: Insufficient documentation

## 2014-10-04 DIAGNOSIS — R5383 Other fatigue: Secondary | ICD-10-CM | POA: Insufficient documentation

## 2014-10-04 DIAGNOSIS — Z7189 Other specified counseling: Secondary | ICD-10-CM | POA: Insufficient documentation

## 2014-10-04 DIAGNOSIS — Z515 Encounter for palliative care: Secondary | ICD-10-CM | POA: Insufficient documentation

## 2014-10-04 NOTE — Addendum Note (Signed)
Encounter addended by: Knox Royalty, NP on: 10/04/2014  4:08 PM<BR>     Documentation filed: Charges VN, Problem List, Clinical Notes

## 2014-10-06 ENCOUNTER — Encounter: Payer: Self-pay | Admitting: Radiation Therapy

## 2014-10-06 NOTE — Progress Notes (Signed)
Jake [name] and [name] attended the brain tumor support group today.

## 2014-10-07 ENCOUNTER — Encounter: Payer: Self-pay | Admitting: Specialist

## 2014-10-07 NOTE — Progress Notes (Signed)
Patient and spouse Jake [name] came to the Brain Tumor Support Group yesterday and I spoke to both then, but Jake [name] seemed to need more support. Called her today to check on her and offer support. She was appreciative. She said the main support needed at the moment is financial. Encouraged her to see financial counselors and gave her Jake [name]'s contact information so she could request coupons.  Will continue to follow up.  Epifania Gore, PhD, [place]

## 2014-10-11 ENCOUNTER — Telehealth: Payer: Self-pay | Admitting: Neurology

## 2014-10-11 ENCOUNTER — Telehealth: Payer: Self-pay | Admitting: Hematology

## 2014-10-11 ENCOUNTER — Other Ambulatory Visit (HOSPITAL_BASED_OUTPATIENT_CLINIC_OR_DEPARTMENT_OTHER): Payer: Commercial Managed Care - PPO

## 2014-10-11 ENCOUNTER — Encounter: Payer: Self-pay | Admitting: Hematology

## 2014-10-11 ENCOUNTER — Ambulatory Visit (HOSPITAL_BASED_OUTPATIENT_CLINIC_OR_DEPARTMENT_OTHER): Payer: Commercial Managed Care - PPO | Admitting: Hematology

## 2014-10-11 VITALS — BP 140/78 | HR 93 | Temp 98.5°F | Resp 18 | Ht 71.0 in | Wt 259.0 lb

## 2014-10-11 DIAGNOSIS — C713 Malignant neoplasm of parietal lobe: Secondary | ICD-10-CM

## 2014-10-11 DIAGNOSIS — C719 Malignant neoplasm of brain, unspecified: Secondary | ICD-10-CM

## 2014-10-11 DIAGNOSIS — M7989 Other specified soft tissue disorders: Secondary | ICD-10-CM

## 2014-10-11 DIAGNOSIS — C714 Malignant neoplasm of occipital lobe: Secondary | ICD-10-CM

## 2014-10-11 LAB — COMPREHENSIVE METABOLIC PANEL (CC13)
ALT: 38 U/L (ref 0–55)
AST: 22 U/L (ref 5–34)
Albumin: 3.2 g/dL — ABNORMAL LOW (ref 3.5–5.0)
Alkaline Phosphatase: 65 U/L (ref 40–150)
Anion Gap: 9 mEq/L (ref 3–11)
BILIRUBIN TOTAL: 0.51 mg/dL (ref 0.20–1.20)
BUN: 18.1 mg/dL (ref 7.0–26.0)
CALCIUM: 9.2 mg/dL (ref 8.4–10.4)
CO2: 24 mEq/L (ref 22–29)
CREATININE: 0.8 mg/dL (ref 0.7–1.3)
Chloride: 108 mEq/L (ref 98–109)
EGFR: >90 mL/min/{1.73_m2} (ref >90)
Glucose: 77 mg/dl (ref 70–140)
Potassium: 4 mEq/L (ref 3.5–5.1)
SODIUM: 140 meq/L (ref 136–145)
Total Protein: 5.9 g/dL — ABNORMAL LOW (ref 6.4–8.3)

## 2014-10-11 LAB — CBC WITH DIFFERENTIAL/PLATELET
BASO%: 0.4 % (ref 0.0–2.0)
Basophils Absolute: 0 10*3/uL (ref 0.0–0.1)
EOS%: 1.7 % (ref 0.0–7.0)
Eosinophils Absolute: 0.1 10*3/uL (ref 0.0–0.5)
HCT: 36.5 % — ABNORMAL LOW (ref 38.4–49.9)
HGB: 12.1 g/dL — ABNORMAL LOW (ref 13.0–17.1)
LYMPH#: 0.6 10*3/uL — AB (ref 0.9–3.3)
LYMPH%: 13.4 % — ABNORMAL LOW (ref 14.0–49.0)
MCH: 31.3 pg (ref 27.2–33.4)
MCHC: 33.2 g/dL (ref 32.0–36.0)
MCV: 94.3 fL (ref 79.3–98.0)
MONO#: 0.4 10*3/uL (ref 0.1–0.9)
MONO%: 9.4 % (ref 0.0–14.0)
NEUT#: 3.5 10*3/uL (ref 1.5–6.5)
NEUT%: 75.1 % — AB (ref 39.0–75.0)
Platelets: 157 10*3/uL (ref 140–400)
RBC: 3.87 10*6/uL — AB (ref 4.20–5.82)
RDW: 18.2 % — ABNORMAL HIGH (ref 11.0–14.6)
WBC: 4.7 10*3/uL (ref 4.0–10.3)

## 2014-10-11 LAB — TECHNOLOGIST REVIEW

## 2014-10-11 MED ORDER — LORAZEPAM 0.5 MG PO TABS: 1.0000 mg | ORAL_TABLET | Freq: Three times a day (TID) | ORAL | Status: DC | PRN

## 2014-10-11 MED ORDER — HYDROCODONE-ACETAMINOPHEN 5-325 MG PO TABS: 1.0000 | ORAL_TABLET | ORAL | Status: DC | PRN

## 2014-10-11 MED ORDER — TEMOZOLOMIDE 100 MG PO CAPS: 300.0000 mg | ORAL_CAPSULE | Freq: Every day | ORAL | Status: DC

## 2014-10-11 MED ORDER — MAGNESIUM OXIDE 400 MG PO TABS: 400.0000 mg | ORAL_TABLET | Freq: Every day | ORAL | Status: DC

## 2014-10-11 MED ORDER — TEMOZOLOMIDE 180 MG PO CAPS: 180.0000 mg | ORAL_CAPSULE | Freq: Every day | ORAL | Status: DC

## 2014-10-11 NOTE — Telephone Encounter (Signed)
Last OV note says: The patient will increase the magnesium to 400 mg daily of magnesium oxide. Rx has been sent.  I called back.  Got no answer.  Mailbox has not been set up.  Unable to leave message.

## 2014-10-11 NOTE — Progress Notes (Signed)
[facility] NOTE  Patient Care Team: [name], MD as PCP - General (Family Medicine) Elaina Hoops, MD as Consulting Physician (Neurosurgery) Eppie Gibson, MD as Attending Physician (Radiation Oncology) Truitt Merle, MD as Consulting Physician (Hematology)     Glioblastoma of occipital lobe   07/02/2014 Imaging Brain MRI: Two adjacent enhancing mass lesions in the right parietal lobe with central necrosis and extensive surrounding edema and mass effect.  7 mm midline shift to the left.  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   CT CAP(-)   07/07/2014 Surgery Subtotal right parietal tumors resection    08/03/2014 Concurrent Chemotherapy Concurrent radiation with Temodar 75 mg/m daily, completed on 09/15/2014.     GBM (glioblastoma multiforme)   07/22/2014 Initial Diagnosis GBM (glioblastoma multiforme)    CURRENT THERAPY: Adjuvant chemo Temodar 150mg /m2, started on 09/29/2014  CHIEF COMPLAINTS: follow up GBM   HISTORY OF INITIAL PRESENTATION;   He initially presented was headaches and sinus congestion in mid October 2015. He was seen by his primary care physician and was treated for sinus infection. He subsequently developed left sided visual deficit, left arm and leg weakness and unsteady gait. He was sent to emergency room by his primary care physician on 07/02/2014. CT  And MRI of brain reviewed to add adjacent enhancing mass lesions in the right parietal lobe with central necrosis and extensive surrounding edema and mass effect. He was admitted and underwent right craniotomy  For resection of the 2 brain masses by Dr. Lonell Grandchild. Postsurgical brain MRI revealed a 1.3 cm area of enhancement at the anterior margin of surgical resection cavity, likely surgical change. His case was reviewed in the tumor board and it was felt he had subtotal resection.  INTERVAL HISTROY: Qadir returns for follow-up. He is doing well overall.  He started the  Cycle 1adjuvant  chemotherapy Temodar on 09/29/2014,  For total 5 days.  He tolerated well. No new complaints. His steroids has been tapered down to 1 mg once daily , and likely will stop in a few days.  He unfortunately lost his job this months, and is going to loose his insurance at the end of this months.  MEDICAL HISTORY:  Past Medical History  Diagnosis Date  . Thyroid disease   . Hypertension   . Allergy   . Arthritis   .    Marland Kitchen Kidney stone     SURGICAL HISTORY: Past Surgical History  Procedure Laterality Date  . Knee arthroscopy [name]/ meniscal repair    . Treatment fistula anal    . 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 Marital Status: Married    Spouse Name: N/A    Number of Children: 5 children, age of 55-21  . Years of Education: N/A   Occupational History  . 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    Social History Main Topics  . Smoking status: Current Every Day Smoker -- 1.00 packs/day for 45 years    Types: Cigarettes  . Smokeless tobacco: Never Used  . Alcohol Use: Yes     Comment: 2-3 beers per/day   . Drug Use: No  . Sexual Activity: Not on file     FAMILY HISTORY: Family History  Problem Relation Age of  Onset  . Hypertension Mother   . Diabetes Father   . Glaucoma Father   . Diabetes Brother   . Diabetes Brother   . Colon cancer Neg Hx   Paternal ancle had prostate cancer. No other family history of malignancy.  ALLERGIES:  has No Known Allergies.   MEDICATIONS:  Current Outpatient Prescriptions  Medication Sig Dispense Refill  . b complex vitamins tablet Take 1 tablet by mouth daily.    . baclofen (LIORESAL) 10 MG tablet Take 1 tablet (10 mg total) by mouth at bedtime. 30 each 1  . CALCIUM-MAG-VIT C-VIT D PO Take 1 tablet by mouth at bedtime.  Trying-started 09/21/13    . cyclobenzaprine (FLEXERIL) 10 MG tablet Take 1 tablet up to TID PRN. 90 tablet 2  . dexamethasone (DECADRON) 2 MG tablet Take 1 tablet (2 mg total) by mouth 2 (two) times daily. (Patient taking differently: Take 2 mg by mouth daily. ) 100 tablet 0  . esomeprazole (NEXIUM) 20 MG capsule Take 1 capsule (20 mg total) by mouth daily at 12 noon. 30 capsule 1  . furosemide (LASIX) 20 MG tablet Take 1 tablet (20 mg total) by mouth every other day. 10 tablet 1  . HYDROcodone-acetaminophen (NORCO/VICODIN) 5-325 MG per tablet Take 1 tablet by mouth every 4 (four) hours as needed for moderate pain. 120 tablet 0  . levETIRAcetam (KEPPRA) 750 MG tablet Take 1 tablet (750 mg total) by mouth 2 (two) times daily. 60 tablet 0  . levothyroxine (SYNTHROID, LEVOTHROID) 50 MCG tablet Take 50 mcg by mouth daily before breakfast. TAKE ONE TABLET BY MOUTH EVERY DAY    . lisinopril (PRINIVIL,ZESTRIL) 40 MG tablet Take 40 mg by mouth daily. TAKE ONE TABLET BY MOUTH EVERY DAY    . LORazepam (ATIVAN) 0.5 MG tablet Take 2 tablets (1 mg total) by mouth every 8 (eight) hours as needed for anxiety or sleep. 60 tablet 1  . magnesium oxide (MAG-OX) 400 MG tablet Take 1 tablet (400 mg total) by mouth daily. 90 tablet 1  . Multiple Vitamins-Minerals (MULTIVITAMIN WITH MINERALS) tablet Take 1 tablet by mouth daily.    . ondansetron (ZOFRAN) 8 MG tablet Take 1 tablet (8 mg total) by mouth every 8 (eight) hours as needed for nausea or vomiting. 30 tablet 4  . potassium chloride SA (K-DUR,KLOR-CON) 20 MEQ tablet Take 1 tablet (20 mEq total) by mouth 2 (two) times daily. 10 tablet 1  . Probiotic Product (PROBIOTIC DAILY PO) Take by mouth daily.     Marland Kitchen sulfamethoxazole-trimethoprim (BACTRIM DS) 800-160 MG per tablet Take 1 tablet by mouth 3 (three) times a week. 14 tablet 6  . verapamil (COVERA HS) 180 MG (CO) 24 hr tablet Take 180 mg by mouth at bedtime.    . Vitamin D-Vitamin K (VITAMIN K2-VITAMIN D3) 45-2000  MCG-UNIT CAPS Take by mouth daily.     No current facility-administered medications for this visit.    REVIEW OF SYSTEMS:   Constitutional: Denies fevers, chills or abnormal night sweats Eyes: Denies blurriness of vision, double vision or watery eyes Ears, nose, mouth, throat, and face: Denies mucositis or sore throat Respiratory: Denies cough, dyspnea or wheezes Cardiovascular: Denies palpitation, chest discomfort or lower extremity swelling Gastrointestinal:  Denies nausea, heartburn or change in bowel habits Skin: Denies abnormal skin rashes Lymphatics: Denies new lymphadenopathy or easy bruising Neurological:positive for mild headaches, and left side vision deficits. His left-sided weakness has nearly resolved. Denies numbness, tingling or new weaknesses Behavioral/Psych: Mood is stable,  no new changes  All other systems were reviewed with the patient and are negative.  PHYSICAL EXAMINATION: ECOG PERFORMANCE STATUS: 1 - Symptomatic but completely ambulatory  KPS: 90%  Filed Vitals:   10/11/14 1117  BP: 140/78  Pulse: 93  Temp: 98.5 F (36.9 C)  Resp: 18   Filed Weights   10/11/14 1117  Weight: 259 lb (117.482 kg)    GENERAL:alert, no distress and comfortable SKIN: skin color, texture, turgor are normal, no rashes or significant lesions HEAD: right-sided posterior craniotomy surgical wound is healing well, no surrounding edema, skin redness or discharge EYES: normal, conjunctiva are pink and non-injected, sclera clear OROPHARYNX:no exudate, no erythema and lips, buccal mucosa, and tongue normal  NECK: supple, thyroid normal size, non-tender, without nodularity LYMPH:  no palpable lymphadenopathy in the cervical, axillary or inguinal LUNGS: clear to auscultation and percussion with normal breathing effort HEART: regular rate & rhythm and no murmurs and no lower extremity edema ABDOMEN:abdomen soft, non-tender and normal bowel sounds Musculoskeletal:no cyanosis of digits  and no clubbing  PSYCH: alert & oriented x 3 with fluent speech NEURO: cranial nerve 2-12 are intact. Left side vision deficits is present in the left upper and mid quadrant. Motor and sensitivity are normal and symmetric on all extremities. Speech is fluent, coordination is grossly intact. Ambulates independently and Lutricia Feil sign negative. EXT: Bilateral pitting edema in ankles, better than before   LABORATORY DATA:  I have reviewed the data as listed Lab Results  Component Value Date   WBC 4.7 10/11/2014   HGB 12.1* 10/11/2014   HCT 36.5* 10/11/2014   MCV 94.3 10/11/2014   PLT 157 10/11/2014    Recent Labs  07/02/14 1518 07/02/14 1548 07/08/14 0644  09/15/14 1044 09/22/14 1039 10/11/14 1052  NA 136* 136* 134*  < > 137 137 140  K 4.0 4.1 4.7  < > 4.3 4.7 4.0  CL 102 100 99  --   --   --   --   CO2  --  24 23  < > 24 28 24   GLUCOSE 102* 96 100*  < > 115 97 77  BUN 12 12 26*  < > 25.2 20.3 18.1  CREATININE 0.80 0.85 0.75  < > 1.0 0.8 0.8  CALCIUM  --  9.3 8.4  < > 8.3* 8.9 9.2  GFRNONAA  --  >90 >90  --   --   --   --   GFRAA  --  >90 >90  --   --   --   --   PROT  --  6.9  --   < > 5.6* 5.8* 5.9*  ALBUMIN  --  3.7  --   < > 3.2* 3.3* 3.2*  AST  --  22  --   < > 23 24 22   ALT  --  20  --   < > 44 47 38  ALKPHOS  --  57  --   < > 62 57 65  BILITOT  --  0.5  --   < > 0.59 0.66 0.51  < > = values in this interval not displayed.  Surgical path 07/07/2014  Diagnosis 1. Brain, biopsy - GLIOBLASTOMA MULTIFORME, WHO GRADE IV/IV. - SEE ONCOLOGY TABLE BELOW. 2. Brain, for tumor resection, higher right parietal - GLIOBLASTOMA MULTIFORME, WHO GRADE IV/IV. - SEE ONCOLOGY TABLE BELOW. 3. Brain, for tumor resection, lower right occipital - GLIOBLASTOMA MULTIFORME, WHO GRADE IV/IV   Microscopic Comment 1. -3. ONCOLOGY TABLE -  BRAIN AND SPINAL CORD 1. Procedure: Resection x2 2. Tumor site, including laterality: Right parietal and right occipital 3. Maximum tumor size (cm): At  least 1.8 cm 4. Histologic type: Glioblastoma multiforme 5. Grade: WHO Grade IV/IV 6. Margins (if applicable): Can not be assessed 7. Ancillary studies: Can be performed upon clinician request.  RADIOGRAPHIC STUDIES: I have personally reviewed the radiological images as listed and agreed with the findings in the report.  Jake Jake [name] Contrast 07/08/2014    IMPRESSION:  1. Postoperative changes from interval right occipital craniotomy with resection of right parieto-occipital mass lesions.  2. 1.3 x 0.5 x 0.5 cm area of enhancement at the anterior margin of the more superior resection cavity in the right parieto-occipital region. It is unclear whether this reflects residual tumor or possibly normal enhancement of the choroid plexus in the adjacent compressed occipital horn of the right lateral ventricle. Attention on follow-up recommended.  3. Fairly linear enhancement about the more inferior resection cavity without definite evidence of residual tumor.  4. Small amount of associated postoperative infarct at the medial aspect of the more superior resection site.  5. Stable 6 mm focus of restricted diffusion in the left frontal lobe, compatible with an acute or subacute infarct.  6. Slightly improved vasogenic edema within the right parieto-occipital region with persistent 7 mm of right-to-left midline shift.     Electronically Signed   By: Jeannine Boga M.D.   On: 07/08/2014 23:45   Jake [name] IR Contrast  IMPRESSION:  Two adjacent enhancing mass lesions in the right parietal lobe with central necrosis and extensive surrounding edema and mass effect. Hyperintensity extends into the splenium of the corpus callosum. 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 Metastatic disease is a less likely consideration. 7 mm midline shift to the left.  Subcentimeter focus of restricted diffusion in the left frontal lobe likely an area of acute or subacute infarction.  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      ASSESSMENT & PLAN:  Jake. [name] is a 59 year old gentleman with newly diagnosed right parietal GBM, status post subtotal resection. He has recovered very well, except mild headaches and left-sided vision deficits. He has finished concurrent chemoradiation or week ago.  1. GBM, s/p subtotal resection, and concurrent chemoRT completed on 09/15/2014 --He is clinically doing well, recovering from his concurrent chemoradiation. -He has started cycle 1 adjuvant chemoTemodar  150 mg/m on day 1-5 every 28 day cycle. If he tolerates well, we'll increase to 200 mg/m from second cycle.  -repeat lab next Thursday  -cont bactrim DS 1 tab every M, [name], F for PCP prophylaxis.  -cont Nexium daily when he is on steroids, vitamin the vitamin D and calcium, and a regular exercise.  2. Leg swelling,  Likely secondary to steroids - Ultrasound of lower extremity was negative for  DVT - skin no still elevated his Lasix when he sits,  And use compression stocks as needed. - he tried Lasix , which did not help much.   Plan: -lab next week and see me back on 2/24, before second cycle on 2/25 -medical refill    All questions were answered. The patient knows to call the clinic with any problems, questions or concerns.  I spent 15 minutes counseling the patient face to face. The total time spent in the appointment was 20 minutes and more than 50% was on counseling.     Truitt Merle, MD 10/11/2014 10:23 PM

## 2014-10-11 NOTE — Telephone Encounter (Signed)
Gave avs & calendar for February. °

## 2014-10-11 NOTE — Telephone Encounter (Signed)
Patient's spouse calling to request Rx refill for magnesium oxide (MAG-OX) 400 MG tablet with new dosage increase indicated.  Please forward Rx to [facility], [place].  Patient stated refill still shows prior dosage amount.  Please call and advise.

## 2014-10-12 ENCOUNTER — Telehealth: Payer: Self-pay | Admitting: Neurology

## 2014-10-12 MED ORDER — BACLOFEN 20 MG PO TABS: 20.0000 mg | ORAL_TABLET | Freq: Four times a day (QID) | ORAL | Status: DC

## 2014-10-12 NOTE — Telephone Encounter (Signed)
I called the patient. The patient indicates that he does better on up to 80 mg daily of the baclofen, I will change the prescription to reflect this change. The patient will be given the 20 mg tablets taking up to 4 daily.

## 2014-10-12 NOTE — Telephone Encounter (Signed)
I called back.  Patient says depending on the day, he has to take 30-40mg  twice daily of the Baclofen.  He would like to know if a new Rx can be written reflecting these instructions.  Please advise.  Thank you.

## 2014-10-12 NOTE — Telephone Encounter (Signed)
Pt's wife is calling back stating that pt needs to have baclofen (LIORESAL) 10 MG tablet refilled but it needs to be 30 or 40mg  and he wants to take it twice a day.  He is out of this medication.  He uses [facility] on [address].  Please call and advise.

## 2014-10-14 ENCOUNTER — Encounter: Payer: Self-pay | Admitting: *Deleted

## 2014-10-14 ENCOUNTER — Ambulatory Visit (HOSPITAL_COMMUNITY)
Admission: RE | Admit: 2014-10-14 | Discharge: 2014-10-14 | Disposition: A | Discharge status: Home / Self Care | Payer: Commercial Managed Care - PPO | Source: Ambulatory Visit | Attending: Radiation Oncology | Admitting: Radiation Oncology

## 2014-10-14 DIAGNOSIS — C714 Malignant neoplasm of occipital lobe: Secondary | ICD-10-CM | POA: Diagnosis not present

## 2014-10-14 MED ORDER — GADOBENATE DIMEGLUMINE 529 MG/ML IV SOLN
20.0000 mL | Freq: Once | INTRAVENOUS | Status: AC | PRN
  Administered 2014-10-14: 20 mL via INTRAVENOUS

## 2014-10-15 ENCOUNTER — Other Ambulatory Visit: Payer: Self-pay | Admitting: Hematology

## 2014-10-17 ENCOUNTER — Telehealth: Payer: Self-pay | Admitting: *Deleted

## 2014-10-17 ENCOUNTER — Ambulatory Visit: Payer: Commercial Managed Care - PPO | Admitting: Radiation Oncology

## 2014-10-17 ENCOUNTER — Encounter: Payer: Self-pay | Admitting: Radiation Oncology

## 2014-10-17 NOTE — Progress Notes (Signed)
Spoke w/ patient over phone re: MRI results (he cancelled visit today due to weather).  Will see him on 2/24 after Dr Burr Medico.   -----------------------------------  Eppie Gibson, MD

## 2014-10-19 ENCOUNTER — Encounter: Payer: Self-pay | Admitting: *Deleted

## 2014-10-19 NOTE — Progress Notes (Signed)
[facility] Work  Clinical Social Work was referred by Estate agent for assessment of psychosocial needs.  Clinical Social Worker contacted patient at home to offer support and assess for needs.  He reports he recently lost his job and is now getting LTD. He is in the process of applying for ss disability as well. He was able to get new health insurance, but is concerned about co-pays. CSW encouraged pt to meet with financial counselor and he plans to do so. He shared that each time he comes, the weather gets bad. CSW reviewed additional resources and assistance options. CSW to meet with pt and wife after his lab appt on 10/20/14. Pt appreciative of call.   Clinical Social Work interventions: Emotional support Resource education  Loren Racer, [place] Worker [place]  [place] Phone: [phone] Fax: [phone]

## 2014-10-20 ENCOUNTER — Other Ambulatory Visit (HOSPITAL_BASED_OUTPATIENT_CLINIC_OR_DEPARTMENT_OTHER): Payer: Commercial Managed Care - PPO

## 2014-10-20 DIAGNOSIS — C713 Malignant neoplasm of parietal lobe: Secondary | ICD-10-CM

## 2014-10-20 DIAGNOSIS — C719 Malignant neoplasm of brain, unspecified: Secondary | ICD-10-CM

## 2014-10-20 LAB — COMPREHENSIVE METABOLIC PANEL (CC13)
ALK PHOS: 84 U/L (ref 40–150)
ALT: 36 U/L (ref 0–55)
ANION GAP: 11 meq/L (ref 3–11)
AST: 30 U/L (ref 5–34)
Albumin: 3.2 g/dL — ABNORMAL LOW (ref 3.5–5.0)
BUN: 12.3 mg/dL (ref 7.0–26.0)
CALCIUM: 9.9 mg/dL (ref 8.4–10.4)
CO2: 24 mEq/L (ref 22–29)
CREATININE: 0.9 mg/dL (ref 0.7–1.3)
Chloride: 105 mEq/L (ref 98–109)
EGFR: >90 mL/min/{1.73_m2} (ref >90)
GLUCOSE: 103 mg/dL (ref 70–140)
Potassium: 4.1 mEq/L (ref 3.5–5.1)
Sodium: 139 mEq/L (ref 136–145)
TOTAL PROTEIN: 6.4 g/dL (ref 6.4–8.3)
Total Bilirubin: 0.77 mg/dL (ref 0.20–1.20)

## 2014-10-20 LAB — CBC WITH DIFFERENTIAL/PLATELET
BASO%: 1.2 % (ref 0.0–2.0)
Basophils Absolute: 0.1 10*3/uL (ref 0.0–0.1)
EOS%: 2.2 % (ref 0.0–7.0)
Eosinophils Absolute: 0.1 10*3/uL (ref 0.0–0.5)
HEMATOCRIT: 35.1 % — AB (ref 38.4–49.9)
HEMOGLOBIN: 11.6 g/dL — AB (ref 13.0–17.1)
LYMPH%: 16.7 % (ref 14.0–49.0)
MCH: 30.7 pg (ref 27.2–33.4)
MCHC: 33.2 g/dL (ref 32.0–36.0)
MCV: 92.5 fL (ref 79.3–98.0)
MONO#: 0.6 10*3/uL (ref 0.1–0.9)
MONO%: 11.8 % (ref 0.0–14.0)
NEUT#: 3.4 10*3/uL (ref 1.5–6.5)
NEUT%: 68.1 % (ref 39.0–75.0)
Platelets: 330 10*3/uL (ref 140–400)
RBC: 3.79 10*6/uL — ABNORMAL LOW (ref 4.20–5.82)
RDW: 18.2 % — ABNORMAL HIGH (ref 11.0–14.6)
WBC: 5 10*3/uL (ref 4.0–10.3)
lymph#: 0.8 10*3/uL — ABNORMAL LOW (ref 0.9–3.3)

## 2014-10-21 ENCOUNTER — Encounter: Payer: Self-pay | Admitting: *Deleted

## 2014-10-21 NOTE — Progress Notes (Signed)
[place] Work  Clinical Social Work was referred by patient for assessment of psychosocial needs due to financial concerns.  Clinical Social Worker met with patient at [facility] after lab visit to offer support and assess for needs.  He had talked with Estate manager/land agent and got signed up for the grant. He recently got terminated from his job. His LTD starts March 1 and he has applied for ss disability as well. He recently purchased new insurance due to his job loss. He is most worried about his medication/chemo pill and co-pays with new insurance. CSW made referrals to Financial Advocate and pt aware to bring in additional bills he would like covered. CSW also made referral to [facility] to see if there were any medication assistance programs to assist him. CSW made referral to [employer] for additional support.  Pt and wife aware to check in with Angela at next visit. They report to be doing pretty well over all, but finances have been the biggest concern. CSW to follow and assist as needed.   Clinical Social Work interventions: Surveyor, minerals assistance  Loren Racer, [place] Worker [place]  [place] Phone: [phone] Fax: [phone]

## 2014-10-26 ENCOUNTER — Other Ambulatory Visit (HOSPITAL_BASED_OUTPATIENT_CLINIC_OR_DEPARTMENT_OTHER): Payer: Commercial Managed Care - PPO

## 2014-10-26 ENCOUNTER — Ambulatory Visit: Payer: Commercial Managed Care - PPO | Admitting: Hematology

## 2014-10-26 ENCOUNTER — Ambulatory Visit
Admission: RE | Admit: 2014-10-26 | Discharge: 2014-10-26 | Disposition: A | Discharge status: Home / Self Care | Payer: Commercial Managed Care - PPO | Source: Ambulatory Visit | Attending: Radiation Oncology | Admitting: Radiation Oncology

## 2014-10-26 ENCOUNTER — Telehealth: Payer: Self-pay | Admitting: Hematology

## 2014-10-26 ENCOUNTER — Ambulatory Visit (HOSPITAL_BASED_OUTPATIENT_CLINIC_OR_DEPARTMENT_OTHER): Payer: Commercial Managed Care - PPO | Admitting: Hematology

## 2014-10-26 ENCOUNTER — Encounter: Payer: Self-pay | Admitting: Hematology

## 2014-10-26 VITALS — BP 129/68 | HR 92 | Temp 97.8°F | Resp 18 | Wt 243.0 lb

## 2014-10-26 VITALS — BP 129/68 | HR 92 | Temp 97.8°F | Resp 18 | Ht 71.0 in | Wt 243.8 lb

## 2014-10-26 DIAGNOSIS — C719 Malignant neoplasm of brain, unspecified: Secondary | ICD-10-CM

## 2014-10-26 DIAGNOSIS — C714 Malignant neoplasm of occipital lobe: Secondary | ICD-10-CM

## 2014-10-26 DIAGNOSIS — R630 Anorexia: Secondary | ICD-10-CM

## 2014-10-26 DIAGNOSIS — C713 Malignant neoplasm of parietal lobe: Secondary | ICD-10-CM

## 2014-10-26 DIAGNOSIS — R110 Nausea: Secondary | ICD-10-CM

## 2014-10-26 DIAGNOSIS — R5383 Other fatigue: Secondary | ICD-10-CM

## 2014-10-26 LAB — COMPREHENSIVE METABOLIC PANEL (CC13)
ALBUMIN: 3.4 g/dL — AB (ref 3.5–5.0)
ALK PHOS: 88 U/L (ref 40–150)
ALT: 33 U/L (ref 0–55)
AST: 32 U/L (ref 5–34)
Anion Gap: 10 mEq/L (ref 3–11)
BUN: 11.2 mg/dL (ref 7.0–26.0)
CALCIUM: 9.8 mg/dL (ref 8.4–10.4)
CHLORIDE: 103 meq/L (ref 98–109)
CO2: 23 mEq/L (ref 22–29)
Creatinine: 0.9 mg/dL (ref 0.7–1.3)
EGFR: >90 mL/min/{1.73_m2} (ref >90)
Glucose: 98 mg/dl (ref 70–140)
POTASSIUM: 3.9 meq/L (ref 3.5–5.1)
SODIUM: 135 meq/L — AB (ref 136–145)
TOTAL PROTEIN: 6.7 g/dL (ref 6.4–8.3)
Total Bilirubin: 0.64 mg/dL (ref 0.20–1.20)

## 2014-10-26 LAB — CBC WITH DIFFERENTIAL/PLATELET
BASO%: 1.3 % (ref 0.0–2.0)
Basophils Absolute: 0.1 10*3/uL (ref 0.0–0.1)
EOS%: 3.8 % (ref 0.0–7.0)
Eosinophils Absolute: 0.2 10*3/uL (ref 0.0–0.5)
HCT: 36.7 % — ABNORMAL LOW (ref 38.4–49.9)
HGB: 12.1 g/dL — ABNORMAL LOW (ref 13.0–17.1)
LYMPH%: 21.3 % (ref 14.0–49.0)
MCH: 30.2 pg (ref 27.2–33.4)
MCHC: 33.1 g/dL (ref 32.0–36.0)
MCV: 91.2 fL (ref 79.3–98.0)
MONO#: 0.5 10*3/uL (ref 0.1–0.9)
MONO%: 9.7 % (ref 0.0–14.0)
NEUT#: 3.6 10*3/uL (ref 1.5–6.5)
NEUT%: 63.9 % (ref 39.0–75.0)
PLATELETS: 365 10*3/uL (ref 140–400)
RBC: 4.02 10*6/uL — ABNORMAL LOW (ref 4.20–5.82)
RDW: 17.6 % — ABNORMAL HIGH (ref 11.0–14.6)
WBC: 5.6 10*3/uL (ref 4.0–10.3)
lymph#: 1.2 10*3/uL (ref 0.9–3.3)

## 2014-10-26 MED ORDER — PROCHLORPERAZINE MALEATE 10 MG PO TABS: 10.0000 mg | ORAL_TABLET | Freq: Four times a day (QID) | ORAL | Status: DC | PRN

## 2014-10-26 NOTE — Progress Notes (Signed)
Radiation Oncology         [phone] ________________________________  Name: Jake [name] MRN: [id number]  Date: 10/26/2014  DOB: 1956/04/12  Follow-Up Visit Note  Outpatient  CC: [name], MD  [name], MD  Diagnosis and Prior Radiotherapy:    ICD-9-CM ICD-10-CM   1. 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  Patient here for routine one month follow up completion of radiation to brain for glioblastoma from 08/03/14-09/15/14.Occasional mild headache "2" on 0 to 10 scale. His biggest concern is nausea. Patient seen by Dr.Feng prior to this appointment and compazine was added to medication list. Patient also to resume tapered dose of dexamethasone. To start full course of temodar on 10/27/14. Nursing left voice message for Raquel concerning funds to assist patient with payment of temodar. No seizures, no new neurologic complaints. Ambulatory. Tired today.           ALLERGIES:  has No Known Allergies.  Meds: Current Outpatient Prescriptions  Medication Sig Dispense Refill  . b complex vitamins tablet Take 1 tablet by mouth daily.    . baclofen (LIORESAL) 20 MG tablet Take 1 tablet (20 mg total) by mouth 4 (four) times daily. 120 each 3  . CALCIUM-MAG-VIT C-VIT D PO Take 1 tablet by mouth at bedtime. Trying-started 09/21/13    . cyclobenzaprine (FLEXERIL) 10 MG tablet Take 1 tablet up to TID PRN. 90 tablet 2  . furosemide (LASIX) 20 MG tablet Take 1 tablet (20 mg total) by mouth every other day. 10 tablet 1  . HYDROcodone-acetaminophen (NORCO/VICODIN) 5-325 MG per tablet Take 1 tablet by mouth every 4 (four) hours as needed for moderate  pain. 120 tablet 0  . levETIRAcetam (KEPPRA) 750 MG tablet Take 1 tablet (750 mg total) by mouth 2 (two) times daily. 60 tablet 0  . levothyroxine (SYNTHROID, LEVOTHROID) 50 MCG tablet Take 50 mcg by mouth daily before breakfast. TAKE ONE TABLET BY MOUTH EVERY DAY    . lisinopril (PRINIVIL,ZESTRIL) 40 MG tablet Take 40 mg by mouth daily. TAKE ONE TABLET BY MOUTH EVERY DAY    . LORazepam (ATIVAN) 0.5 MG tablet Take 2 tablets (1 mg total) by mouth every 8 (eight) hours as needed for anxiety or sleep. 60 tablet 1  . magnesium oxide (MAG-OX) 400 MG tablet Take 1 tablet (400 mg total) by mouth daily. 90 tablet 1  . Multiple Vitamins-Minerals (MULTIVITAMIN WITH MINERALS) tablet Take 1 tablet by mouth daily.    Marland Kitchen NEXIUM 24HR 20 MG capsule take 1 capsule by mouth once daily at noon 28 capsule 1  . ondansetron (ZOFRAN) 8 MG tablet Take 1 tablet (8 mg total) by mouth every 8 (eight) hours as needed for nausea or vomiting. 30 tablet 4  . potassium chloride SA (K-DUR,KLOR-CON) 20 MEQ tablet Take 1 tablet (20 mEq total) by mouth 2 (two) times daily. 10 tablet 1  . Probiotic Product (PROBIOTIC DAILY PO) Take by mouth daily.     Marland Kitchen sulfamethoxazole-trimethoprim (BACTRIM DS) 800-160 MG per tablet Take 1 tablet by mouth 3 (three) times a week. 14 tablet 6  . [START ON 10/27/2014] temozolomide (TEMODAR) 100 MG capsule Take 3  capsules (300 mg total) by mouth daily. May take on an empty stomach or at bedtime to decrease nausea & vomiting. 75 capsule 0  . temozolomide (TEMODAR) 180 MG capsule Take 1 capsule (180 mg total) by mouth daily. May take on an empty stomach or at bedtime to decrease nausea & vomiting. 25 capsule 0  . verapamil (COVERA HS) 180 MG (CO) 24 hr tablet Take 180 mg by mouth at bedtime.    . Vitamin D-Vitamin K (VITAMIN K2-VITAMIN D3) 45-2000 MCG-UNIT CAPS Take by mouth daily.    . prochlorperazine (COMPAZINE) 10 MG tablet Take 1 tablet (10 mg total) by mouth every 6 (six) hours as needed for nausea or  vomiting. (Patient not taking: Reported on 10/26/2014) 60 tablet 1   No current facility-administered medications for this encounter.    Physical Findings: The patient is in no acute distress. Patient is alert and oriented.  weight is 243 lb (110.224 kg). His temperature is 97.8 F (36.6 C). His blood pressure is 129/68 and his pulse is 92. His respiration is 18 and oxygen saturation is 100%. .   NAD, no thrush, neurologic exam is grossly non focal, strength intact, sensation intact; scalp skin intact and smooth.   Lab Findings: Lab Results  Component Value Date   WBC 5.6 10/26/2014   HGB 12.1* 10/26/2014   HCT 36.7* 10/26/2014   MCV 91.2 10/26/2014   PLT 365 10/26/2014    Radiographic Findings: Mr [name] WC Contrast  10/14/2014   CLINICAL DATA:  RIGHT occipital glioblastoma, status post resection and stereotactic radiosurgery. No current symptoms are reported. Subsequent encounter. Previously, left-sided weakness and visual field loss was noted.  EXAM: MRI HEAD WITHOUT AND WITH CONTRAST  TECHNIQUE: Multiplanar, multiecho pulse sequences of the brain and surrounding structures were obtained without and with intravenous contrast.  CONTRAST:  55mL MULTIHANCE GADOBENATE DIMEGLUMINE 529 MG/ML IV SOLN  COMPARISON:  Multiple priors, preoperative scan 07/02/2014. Postoperative scan 07/08/2014.  FINDINGS: The patient is status post RIGHT occipital craniotomy with subtotal resection of a pathologically proven GBM. Non worrisome appearing 3 mm extra-axial fluid collection at the craniotomy site without compression underlying brain. Postoperative cavity measures 24 x 22 mm cross-section as seen on image 17 series 5 with associated blood products.  There are two discrete foci of nodular enhancement surrounding the larger resection cavity. The larger lesion is lateral and inferior measuring 13 x 11 x 15 mm. There is moderate thick-walled enhancement. This appears to represent contraction of a previous  smaller resection cavity. The smaller lesion is more anterior/deep to the larger resection cavity, measuring 10 x 8 by 10 mm. Both lesions display mild associated edema, with slight restricted diffusion noted of the smaller lesion. Both areas appear somewhat worse compared with the immediate postoperative scan. For instance the lateral and inferior lesion cavity is smaller but the nodular wall thickness is increased. Similarly the more superior and anterior lesion appears more nodular now that there is been some resolution of the postoperative blood products. Predominantly thin, linear enhancement of the resection cavity (single arrows), as seen for instance on image 16 series 13 and image 6 series 12, is a nonspecific finding but should be followed closely.  No contralateral spread or new lesions. Mild cerebral and cerebellar atrophy. Resolution of previous mass effect, vasogenic edema, and right-to-left midline shift. Flow voids are maintained. No significant sinus or mastoid disease. Negative orbits.  IMPRESSION: Two discrete foci of enhancement in the RIGHT occipital lobe surround the larger resection  cavity, and are suspected to represent foci of residual tumor/GBM displaying interval growth. Recommend continued short-term follow-up.   Electronically Signed   By: Rolla Flatten M.D.   On: 10/14/2014 16:36    Impression/Plan:  Doing relatively well.  Discussed MRI report and reviewed images with pt and his wife. There is concern for two small areas of growing/residual tumor vs treatment changes.   FLAIR abnormality has improved significantly. Consensus at our CNS tumor board was to f/u with repeat imaging in 62mo and continue Temodar as planned. We will arrange f/u in my office in 41mo after brain MRI.    _____________________________________   Eppie Gibson, MD

## 2014-10-26 NOTE — Progress Notes (Signed)
[facility] NOTE  Patient Care Team: [name], MD as PCP - General (Family Medicine) Elaina Hoops, MD as Consulting Physician (Neurosurgery) Eppie Gibson, MD as Attending Physician (Radiation Oncology) Truitt Merle, MD as Consulting Physician (Hematology)     Glioblastoma of occipital lobe   07/02/2014 Imaging Brain MRI: Two adjacent enhancing mass lesions in the right parietal lobe with central necrosis and extensive surrounding edema and mass effect.  7 mm midline shift to the left.  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   CT CAP(-)   07/07/2014 Surgery Subtotal right parietal tumors resection    08/03/2014 Concurrent Chemotherapy Concurrent radiation with Temodar 75 mg/m daily, completed on 09/15/2014.     GBM (glioblastoma multiforme)   07/22/2014 Initial Diagnosis GBM (glioblastoma multiforme)    CURRENT THERAPY: Adjuvant chemo Temodar 150mg /m2, started on 09/29/2014  CHIEF COMPLAINTS: follow up GBM   HISTORY OF INITIAL PRESENTATION;   He initially presented was headaches and sinus congestion in mid October 2015. He was seen by his primary care physician and was treated for sinus infection. He subsequently developed left sided visual deficit, left arm and leg weakness and unsteady gait. He was sent to emergency room by his primary care physician on 07/02/2014. CT  And MRI of brain reviewed to add adjacent enhancing mass lesions in the right parietal lobe with central necrosis and extensive surrounding edema and mass effect. He was admitted and underwent right craniotomy  For resection of the 2 brain masses by Dr. Lonell Grandchild. Postsurgical brain MRI revealed a 1.3 cm area of enhancement at the anterior margin of surgical resection cavity, likely surgical change. His case was reviewed in the tumor board and it was felt he had subtotal resection.  INTERVAL HISTROY: Daronte returns for follow-up. He tapered off dexa about 1-2 weeks ago and started  feeling fatigued, nauseaed, and lost appetite. He laost about 20 lbs in the past month. He had name a few episodes of vomiting also, no fever or chills. He is still able to function well at home. No other new complaints. His left-sided vision is still absent, no headaches or other new neuro symptoms.  MEDICAL HISTORY:  Past Medical History  Diagnosis Date  . Thyroid disease   . Hypertension   . Allergy   . Arthritis   .    Marland Kitchen Kidney stone     SURGICAL HISTORY: Past Surgical History  Procedure Laterality Date  . Knee arthroscopy w/ meniscal repair    . Treatment fistula anal    . 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 Marital Status: Married    Spouse Name: N/A    Number of Children: 5 children, age of 24-21  . Years of Education: N/A   Occupational History  . 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    Social History Main Topics  . Smoking status: Current Every Day Smoker -- 1.00 packs/day for 45 years    Types: Cigarettes  . Smokeless tobacco: Never Used  . Alcohol Use: Yes     Comment: 2-3 beers per/day   . Drug Use: No  . Sexual Activity: Not on file     FAMILY HISTORY: Family History  Problem Relation Age of Onset  . Hypertension Mother   .  Diabetes Father   . Glaucoma Father   . Diabetes Brother   . Diabetes Brother   . Colon cancer Neg Hx   Paternal ancle had prostate cancer. No other family history of malignancy.  ALLERGIES:  has No Known Allergies.   MEDICATIONS:  Current Outpatient Prescriptions  Medication Sig Dispense Refill  . b complex vitamins tablet Take 1 tablet by mouth daily.    . baclofen (LIORESAL) 20 MG tablet Take 1 tablet (20 mg total) by mouth 4 (four) times daily. 120 each 3  . CALCIUM-MAG-VIT C-VIT D PO Take 1 tablet by mouth  at bedtime. Trying-started 09/21/13    . cyclobenzaprine (FLEXERIL) 10 MG tablet Take 1 tablet up to TID PRN. 90 tablet 2  . furosemide (LASIX) 20 MG tablet Take 1 tablet (20 mg total) by mouth every other day. 10 tablet 1  . HYDROcodone-acetaminophen (NORCO/VICODIN) 5-325 MG per tablet Take 1 tablet by mouth every 4 (four) hours as needed for moderate pain. 120 tablet 0  . levETIRAcetam (KEPPRA) 750 MG tablet Take 1 tablet (750 mg total) by mouth 2 (two) times daily. 60 tablet 0  . levothyroxine (SYNTHROID, LEVOTHROID) 50 MCG tablet Take 50 mcg by mouth daily before breakfast. TAKE ONE TABLET BY MOUTH EVERY DAY    . lisinopril (PRINIVIL,ZESTRIL) 40 MG tablet Take 40 mg by mouth daily. TAKE ONE TABLET BY MOUTH EVERY DAY    . LORazepam (ATIVAN) 0.5 MG tablet Take 2 tablets (1 mg total) by mouth every 8 (eight) hours as needed for anxiety or sleep. 60 tablet 1  . magnesium oxide (MAG-OX) 400 MG tablet Take 1 tablet (400 mg total) by mouth daily. 90 tablet 1  . Multiple Vitamins-Minerals (MULTIVITAMIN WITH MINERALS) tablet Take 1 tablet by mouth daily.    Marland Kitchen NEXIUM 24HR 20 MG capsule take 1 capsule by mouth once daily at noon 28 capsule 1  . ondansetron (ZOFRAN) 8 MG tablet Take 1 tablet (8 mg total) by mouth every 8 (eight) hours as needed for nausea or vomiting. 30 tablet 4  . potassium chloride SA (K-DUR,KLOR-CON) 20 MEQ tablet Take 1 tablet (20 mEq total) by mouth 2 (two) times daily. 10 tablet 1  . Probiotic Product (PROBIOTIC DAILY PO) Take by mouth daily.     Marland Kitchen sulfamethoxazole-trimethoprim (BACTRIM DS) 800-160 MG per tablet Take 1 tablet by mouth 3 (three) times a week. 14 tablet 6  . [START ON 10/27/2014] temozolomide (TEMODAR) 100 MG capsule Take 3 capsules (300 mg total) by mouth daily. May take on an empty stomach or at bedtime to decrease nausea & vomiting. 75 capsule 0  . temozolomide (TEMODAR) 180 MG capsule Take 1 capsule (180 mg total) by mouth daily. May take on an empty stomach or at  bedtime to decrease nausea & vomiting. 25 capsule 0  . verapamil (COVERA HS) 180 MG (CO) 24 hr tablet Take 180 mg by mouth at bedtime.    . Vitamin D-Vitamin K (VITAMIN K2-VITAMIN D3) 45-2000 MCG-UNIT CAPS Take by mouth daily.    . prochlorperazine (COMPAZINE) 10 MG tablet Take 1 tablet (10 mg total) by mouth every 6 (six) hours as needed for nausea or vomiting. 60 tablet 1   No current facility-administered medications for this visit.    REVIEW OF SYSTEMS:   Constitutional: Denies fevers, chills or abnormal night sweats Eyes: Denies blurriness of vision, double vision or watery eyes Ears, nose, mouth, throat, and face: Denies mucositis or sore throat Respiratory: Denies cough, dyspnea or wheezes  Cardiovascular: Denies palpitation, chest discomfort or lower extremity swelling Gastrointestinal:  Denies nausea, heartburn or change in bowel habits Skin: Denies abnormal skin rashes Lymphatics: Denies new lymphadenopathy or easy bruising Neurological:positive for mild headaches, and left side vision deficits. His left-sided weakness has nearly resolved. Denies numbness, tingling or new weaknesses Behavioral/Psych: Mood is stable, no new changes  All other systems were reviewed with the patient and are negative.  PHYSICAL EXAMINATION: ECOG PERFORMANCE STATUS: 1 - Symptomatic but completely ambulatory  KPS: 90%  Filed Vitals:   10/26/14 1040  BP: 129/68  Pulse: 92  Temp: 97.8 F (36.6 C)  Resp: 18   Filed Weights   10/26/14 1040  Weight: 243 lb 12.8 oz (110.587 kg)    GENERAL:alert, no distress and comfortable SKIN: skin color, texture, turgor are normal, no rashes or significant lesions HEAD: right-sided posterior craniotomy surgical wound is healing well, no surrounding edema, skin redness or discharge EYES: normal, conjunctiva are pink and non-injected, sclera clear OROPHARYNX:no exudate, no erythema and lips, buccal mucosa, and tongue normal  NECK: supple, thyroid normal size,  non-tender, without nodularity LYMPH:  no palpable lymphadenopathy in the cervical, axillary or inguinal LUNGS: clear to auscultation and percussion with normal breathing effort HEART: regular rate & rhythm and no murmurs and no lower extremity edema ABDOMEN:abdomen soft, non-tender and normal bowel sounds Musculoskeletal:no cyanosis of digits and no clubbing  PSYCH: alert & oriented x 3 with fluent speech NEURO: cranial nerve 2-12 are intact. Left side vision deficits is present in the left upper and mid quadrant. Motor and sensitivity are normal and symmetric on all extremities. Speech is fluent, coordination is grossly intact. Ambulates independently and Lutricia Feil sign negative. EXT: Bilateral pitting edema in ankles, better than before   LABORATORY DATA:  Surgical path 07/07/2014  Diagnosis 1. Brain, biopsy - GLIOBLASTOMA MULTIFORME, WHO GRADE IV/IV. - SEE ONCOLOGY TABLE BELOW. 2. Brain, for tumor resection, higher right parietal - GLIOBLASTOMA MULTIFORME, WHO GRADE IV/IV. - SEE ONCOLOGY TABLE BELOW. 3. Brain, for tumor resection, lower right occipital - GLIOBLASTOMA MULTIFORME, WHO GRADE IV/IV   Microscopic Comment 1. -3. ONCOLOGY TABLE - BRAIN AND SPINAL CORD 1. Procedure: Resection x2 2. Tumor site, including laterality: Right parietal and right occipital 3. Maximum tumor size (cm): At least 1.8 cm 4. Histologic type: Glioblastoma multiforme 5. Grade: WHO Grade IV/IV 6. Margins (if applicable): Can not be assessed 7. Ancillary studies: Can be performed upon clinician request.  RADIOGRAPHIC STUDIES: I have personally reviewed the radiological images as listed and agreed with the findings in the report.  Jake [name] Jake [name] Contrast 07/08/2014    IMPRESSION:  1. Postoperative changes from interval right occipital craniotomy with resection of right parieto-occipital mass lesions.  2. 1.3 x 0.5 x 0.5 cm area of enhancement at the anterior margin of the more superior resection cavity  in the right parieto-occipital region. It is unclear whether this reflects residual tumor or possibly normal enhancement of the choroid plexus in the adjacent compressed occipital horn of the right lateral ventricle. Attention on follow-up recommended.  3. Fairly linear enhancement about the more inferior resection cavity without definite evidence of residual tumor.  4. Small amount of associated postoperative infarct at the medial aspect of the more superior resection site.  5. Stable 6 mm focus of restricted diffusion in the left frontal lobe, compatible with an acute or subacute infarct.  6. Slightly improved vasogenic edema within the right parieto-occipital region with persistent 7 mm of right-to-left midline shift.  Electronically Signed   By: Jeannine Boga M.D.   On: 07/08/2014 23:45   10/14/2014 Jake [name] Brain W HM Contrast  IMPRESSION: Two discrete foci of enhancement in the RIGHT occipital lobe surround the larger resection cavity, and are suspected to represent foci of residual tumor/GBM displaying interval growth. Recommend continued short-term follow-up..      ASSESSMENT & PLAN:  Jake [name]. Jake [name] is a 59 year old gentleman with newly diagnosed right parietal GBM, status post subtotal resection. He has recovered very well, except mild headaches and left-sided vision deficits. He has finished concurrent chemoradiation or week ago.  1. GBM, s/p subtotal resection, and concurrent chemoRT completed on 09/15/2014 --He tolerated the first cycle adjuvant Temodar well. His fatigue, nausea, anorexia is more likely related to being off steroids. -I will increase  His Temodar to 200 mg/m from next cycle. He was supposed to start tomorrow however. He lost his job and insurance. He does have new insurance (affordable care) now, but has high co-pay. He is not able to afford the co-pay now. He is going to speak to our management team, to see if he can get financial assistance from our cancer center  for medication copies. He will start Temodar as well as he fills it. -I reviewed his restaging brain MRI, which showed 2 small foci of enhancement around the surgical site, possible residual disease. This was discussed in the neuro tumor Board and the consensus was repeating brain scan in 3 months and continue adjuvant chemotherapy. -he had no significant marrow suppression from Temodar, labs are adequate for treatment. -cont bactrim DS 1 tab every M, W, F for PCP prophylaxis.  -cont Nexium daily when he is on steroids, vitamin the vitamin D and calcium, and a regular exercise.  2. Anorexia, fatigue, nausea -This is likely related to his being off steroids. His vital signs are stable. No frank signs of adrenal insufficiency. -due to his significant weight loss, I recommend him to restart dexamethasone 4 mg for 2-3 days, and cut down to 2 mg daily for 4-5 days, then every other day for additional 2-3 doses.   Plan: -start second cycle Temodar at 480 mg daily for 5 days, out of 28 days, as well as you receive your medication.  -Return to clinic in 4 weeks with lab and follow-up.   All questions were answered. The patient knows to call the clinic with any problems, questions or concerns.  I spent 20 minutes counseling the patient face to face. The total time spent in the appointment was 25 minutes and more than 50% was on counseling.     Truitt Merle, MD 10/26/2014 11:19 AM

## 2014-10-26 NOTE — Telephone Encounter (Signed)
s.w. pt wife and advise don March appt....ok and aware

## 2014-10-26 NOTE — Progress Notes (Signed)
Patient here for routine one month follow up completion of radiation to brain for glioblastoma from 08/03/14-09/15/14.Occasional mild headache "2" on 0 to 10 scale.biggest concern is nausea.Patient seen by Dr.Feng prior to this appointment and compazine was added to medication list.Patient also to resume tapered dose of dexamethasone.To start full course of temodar on 10/27/14.I left voice message for Raquel concerning funds to assist patient with payment of temodar.Patient scalp is a little dry without redness.Continue application of biafine.

## 2014-10-26 NOTE — Telephone Encounter (Signed)
error 

## 2014-10-27 ENCOUNTER — Ambulatory Visit: Payer: Commercial Managed Care - PPO | Admitting: Nutrition

## 2014-10-27 ENCOUNTER — Telehealth: Payer: Self-pay | Admitting: *Deleted

## 2014-10-27 NOTE — Progress Notes (Signed)
59 year old  male diagnosed with glioblastoma.  He is a patient of Dr. Burr Medico and Dr. Isidore Moos.  Past medical history includes thyroid disease, hypertension, allergy, sleep apnea and radiation therapy.  Medications include B complex vitamin, calcium, magnesium and vitamin C, Decadron, probiotics, Lasix, Synthroid, Ativan, magnesium oxide, multivitamin, Nexium, Zofran.  Labs were reviewed.  Height: 5 feet 11 inches. Weight: 244.2 pounds. Usual body weight: 240-250 pounds. BMI: 34.06.    Patient reports he is back on steroids causing nausea.  He denies vomiting.   He does report constipation.   Patient endorses weight loss over the last 2-3 weeks.   He is drinking 6-7 bottles of water daily along with Pedialyte.   Patient reports taste alterations.  Nutrition diagnosis: Unintended weight loss related to glioblastoma and associated treatments as evidenced by 6 percent weight loss.  Intervention: Educated patient to consume smaller more frequent meals and snacks with adequate protein. Educated patient on strategies for preventing nausea.  Provided fact sheet on nausea and vomiting. Reviewed importance of adequate hydration. Educated patient on foods to eat to improve constipation.  Provided fact sheet on constipation. Educated patient on strategies for improving taste and provided fact sheet on taste alterations. Questions were answered.  Teach back method used.  Contact information was given. Patient was provided with samples of oral nutrition supplements at his request.  Monitoring, evaluation, goals: Patient will tolerate adequate calories and protein to promote maintenance of lean body mass.  Next visit: Patient will contact me for questions or concerns.  **Disclaimer: This note was dictated with voice recognition software. Similar sounding words can inadvertently be transcribed and this note may contain transcription errors which may not have been corrected upon publication of  note.**

## 2014-10-27 NOTE — Telephone Encounter (Signed)
Aaron Edelman at [facility] called.  He needs to discuss the Temodar prescription for this patient again.  Please call him directly at [phone] or at the main pharmacy number.

## 2014-10-28 ENCOUNTER — Other Ambulatory Visit: Payer: Self-pay | Admitting: Hematology

## 2014-10-28 ENCOUNTER — Other Ambulatory Visit: Payer: Self-pay | Admitting: Radiation Therapy

## 2014-10-28 ENCOUNTER — Encounter: Payer: Self-pay | Admitting: *Deleted

## 2014-10-28 ENCOUNTER — Other Ambulatory Visit: Payer: Self-pay | Admitting: *Deleted

## 2014-10-28 DIAGNOSIS — C714 Malignant neoplasm of occipital lobe: Secondary | ICD-10-CM

## 2014-10-28 NOTE — Progress Notes (Signed)
Counselor called spouse, Jake [name], to check in and see how they were doing. Jake [name] stated she was okay and they were doing well, and also she appreciated all of the support offered at the [facility]. Both she and Jake [name] are planning on attending the Brain Tumor Support Group next week.  Jake [name] Counseling Intern [phone]

## 2014-11-03 ENCOUNTER — Encounter: Payer: Self-pay | Admitting: Hematology

## 2014-11-03 NOTE — Progress Notes (Signed)
Left [employer] asst form for dr. Burr Medico for possible asst with temodar for patient

## 2014-11-08 ENCOUNTER — Telehealth: Payer: Self-pay | Admitting: *Deleted

## 2014-11-08 NOTE — Telephone Encounter (Signed)
error 

## 2014-11-15 ENCOUNTER — Encounter: Payer: Self-pay | Admitting: Hematology

## 2014-11-15 NOTE — Progress Notes (Signed)
I faxed patient asst form to [employer]  [phone]

## 2014-11-17 ENCOUNTER — Telehealth: Payer: Self-pay | Admitting: *Deleted

## 2014-11-17 ENCOUNTER — Other Ambulatory Visit: Payer: Self-pay | Admitting: Hematology

## 2014-11-17 DIAGNOSIS — C714 Malignant neoplasm of occipital lobe: Secondary | ICD-10-CM

## 2014-11-17 NOTE — Telephone Encounter (Signed)
Mr. Toussaint called to inquire about status of Temodar through [employer] Patient assistance.  Noted request has been faxed.  No response at this time.  Next F/U 11-22-2014 and reports he is to resume Temodar on 11-22-3014.

## 2014-11-18 ENCOUNTER — Other Ambulatory Visit: Payer: Self-pay

## 2014-11-18 DIAGNOSIS — C714 Malignant neoplasm of occipital lobe: Secondary | ICD-10-CM

## 2014-11-18 MED ORDER — LORAZEPAM 0.5 MG PO TABS: ORAL_TABLET | ORAL | Status: DC

## 2014-11-18 NOTE — Telephone Encounter (Signed)
S/w Dr Burr Medico and called in ativan refill to [facility] on [address]. S/w tech at [facility] outpatient pharmacy that Mr wey has appt on 3/22 and will be getting his temodar, it will be up to the pharmacy if they pre-order it for the pt.

## 2014-11-18 NOTE — Telephone Encounter (Signed)
S/w pt we had electronic refill request and seeing if he needs the ativan yet. He has about 6 days worth left. Pt also asked if we need to notify [facility] outpatient pharmacy that it will be time to refill his oral chemo at the 3/22 office visit. Will s/w Dr Burr Medico.

## 2014-11-22 ENCOUNTER — Encounter: Payer: Self-pay | Admitting: Hematology

## 2014-11-22 ENCOUNTER — Other Ambulatory Visit (HOSPITAL_BASED_OUTPATIENT_CLINIC_OR_DEPARTMENT_OTHER): Payer: 59

## 2014-11-22 ENCOUNTER — Ambulatory Visit (HOSPITAL_BASED_OUTPATIENT_CLINIC_OR_DEPARTMENT_OTHER): Payer: 59 | Admitting: Hematology

## 2014-11-22 ENCOUNTER — Telehealth: Payer: Self-pay | Admitting: *Deleted

## 2014-11-22 VITALS — BP 118/65 | HR 110 | Temp 98.4°F | Resp 18 | Ht 71.0 in | Wt 233.0 lb

## 2014-11-22 DIAGNOSIS — R630 Anorexia: Secondary | ICD-10-CM | POA: Diagnosis not present

## 2014-11-22 DIAGNOSIS — C713 Malignant neoplasm of parietal lobe: Secondary | ICD-10-CM | POA: Diagnosis not present

## 2014-11-22 DIAGNOSIS — C714 Malignant neoplasm of occipital lobe: Secondary | ICD-10-CM

## 2014-11-22 DIAGNOSIS — R5383 Other fatigue: Secondary | ICD-10-CM

## 2014-11-22 DIAGNOSIS — C719 Malignant neoplasm of brain, unspecified: Secondary | ICD-10-CM

## 2014-11-22 DIAGNOSIS — R110 Nausea: Secondary | ICD-10-CM

## 2014-11-22 LAB — CBC WITH DIFFERENTIAL/PLATELET
BASO%: 0.2 % (ref 0.0–2.0)
Basophils Absolute: 0 10*3/uL (ref 0.0–0.1)
EOS ABS: 0.3 10*3/uL (ref 0.0–0.5)
EOS%: 5.3 % (ref 0.0–7.0)
HEMATOCRIT: 39.1 % (ref 38.4–49.9)
HGB: 12.9 g/dL — ABNORMAL LOW (ref 13.0–17.1)
LYMPH#: 1.2 10*3/uL (ref 0.9–3.3)
LYMPH%: 21.6 % (ref 14.0–49.0)
MCH: 30.4 pg (ref 27.2–33.4)
MCHC: 33 g/dL (ref 32.0–36.0)
MCV: 92.2 fL (ref 79.3–98.0)
MONO#: 0.5 10*3/uL (ref 0.1–0.9)
MONO%: 8.2 % (ref 0.0–14.0)
NEUT#: 3.6 10*3/uL (ref 1.5–6.5)
NEUT%: 64.7 % (ref 39.0–75.0)
PLATELETS: 62 10*3/uL — AB (ref 140–400)
RBC: 4.24 10*6/uL (ref 4.20–5.82)
RDW: 16 % — ABNORMAL HIGH (ref 11.0–14.6)
WBC: 5.6 10*3/uL (ref 4.0–10.3)

## 2014-11-22 LAB — COMPREHENSIVE METABOLIC PANEL (CC13)
ALBUMIN: 3.4 g/dL — AB (ref 3.5–5.0)
ALK PHOS: 71 U/L (ref 40–150)
ALT: 30 U/L (ref 0–55)
AST: 23 U/L (ref 5–34)
Anion Gap: 12 mEq/L — ABNORMAL HIGH (ref 3–11)
BUN: 14.7 mg/dL (ref 7.0–26.0)
CO2: 21 mEq/L — ABNORMAL LOW (ref 22–29)
Calcium: 9.7 mg/dL (ref 8.4–10.4)
Chloride: 102 mEq/L (ref 98–109)
Creatinine: 1 mg/dL (ref 0.7–1.3)
EGFR: 78 mL/min/{1.73_m2} — ABNORMAL LOW (ref >90)
Glucose: 119 mg/dl (ref 70–140)
POTASSIUM: 4.1 meq/L (ref 3.5–5.1)
SODIUM: 136 meq/L (ref 136–145)
Total Bilirubin: 0.83 mg/dL (ref 0.20–1.20)
Total Protein: 6.5 g/dL (ref 6.4–8.3)

## 2014-11-22 MED ORDER — OMEPRAZOLE 20 MG PO CPDR: 20.0000 mg | DELAYED_RELEASE_CAPSULE | Freq: Every day | ORAL | Status: DC

## 2014-11-22 NOTE — Progress Notes (Signed)
Per Verdene Lennert at [employer] to check oh status of application for asst for Temodar.The patient was approved for Temodar [id number]. I sent message to Thu to let the patient know. They will contact the patient once all is finalized.

## 2014-11-22 NOTE — Telephone Encounter (Signed)
Spoke with pt and informed pt re:  Per [employer], pt has been approved for [employer].  Pt will be contacted when everything is finalized.  Gave pt  ID #   [id number] should pt need to contact [employer] directly. Asked pt to call office and let nurse know when pt receives Temodar.   Pt voiced understanding.

## 2014-11-22 NOTE — Progress Notes (Signed)
[place] NOTE  Patient Care Team: [name], MD as PCP - General (Family Medicine) Kary Kos, MD as Consulting Physician (Neurosurgery) Eppie Gibson, MD as Attending Physician (Radiation Oncology) Truitt Merle, MD as Consulting Physician (Hematology)     Glioblastoma of occipital lobe   07/02/2014 Imaging Brain MRI: Two adjacent enhancing mass lesions in the right parietal lobe with central necrosis and extensive surrounding edema and mass effect.  7 mm midline shift to the left.  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   CT CAP(-)   07/07/2014 Surgery Subtotal right parietal tumors resection    08/03/2014 Concurrent Chemotherapy Concurrent radiation with Temodar 75 mg/m daily, completed on 09/15/2014.     GBM (glioblastoma multiforme)   07/22/2014 Initial Diagnosis GBM (glioblastoma multiforme)    CURRENT THERAPY: Adjuvant chemo Temodar 150mg /m2, started on 09/29/2014  CHIEF COMPLAINTS: follow up GBM   HISTORY OF INITIAL PRESENTATION;   He initially presented was headaches and sinus congestion in mid October 2015. He was seen by his primary care physician and was treated for sinus infection. He subsequently developed left sided visual deficit, left arm and leg weakness and unsteady gait. He was sent to emergency room by his primary care physician on 07/02/2014. CT  And MRI of brain reviewed to add adjacent enhancing mass lesions in the right parietal lobe with central necrosis and extensive surrounding edema and mass effect. He was admitted and underwent right craniotomy  For resection of the 2 brain masses by Dr. Lonell Grandchild. Postsurgical brain MRI revealed a 1.3 cm area of enhancement at the anterior margin of surgical resection cavity, likely surgical change. His case was reviewed in the tumor board and it was felt he had subtotal resection.  INTERVAL HISTROY: [facility] returns for follow-up. He tolerated second cycle Temodar well overall. He did have  some nausea, which improved with Zofran. No significant vomiting. His energy level improved some after the course of steroids. He has been off cigarettes for 2 weeks now. He has moderate fatigue, low appetite, no other new complaints. No bleeding. He and his wife has been under a lot of financial stress lately due to his loss of job, and has been difficult to meet the ends.   MEDICAL HISTORY:  Past Medical History  Diagnosis Date  . Thyroid disease   . Hypertension   . Allergy   . Arthritis   .    Marland Kitchen Kidney stone     SURGICAL HISTORY: Past Surgical History  Procedure Laterality Date  . Knee arthroscopy w/ meniscal repair    . Treatment fistula anal    . 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 Marital Status: Married    Spouse Name: N/A    Number of Children: 5 children, age of 60-21  . Years of Education: N/A   Occupational History  . 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    Social History Main Topics  . Smoking status: Current Every Day Smoker -- 1.00 packs/day for 45 years    Types: Cigarettes  . Smokeless tobacco: Never Used  . Alcohol Use: Yes     Comment: 2-3 beers per/day   . Drug Use: No  . Sexual Activity: Not on file     FAMILY  HISTORY: Family History  Problem Relation Age of Onset  . Hypertension Mother   . Diabetes Father   . Glaucoma Father   . Diabetes Brother   . Diabetes Brother   . Colon cancer Neg Hx   Paternal ancle had prostate cancer. No other family history of malignancy.  ALLERGIES:  has No Known Allergies.   MEDICATIONS:  Current Outpatient Prescriptions  Medication Sig Dispense Refill  . b complex vitamins tablet Take 1 tablet by mouth daily.    . baclofen (LIORESAL) 20 MG tablet Take 1 tablet (20 mg total) by mouth 4 (four)  times daily. 120 each 3  . CALCIUM-MAG-VIT C-VIT D PO Take 1 tablet by mouth at bedtime. Trying-started 09/21/13    . cyclobenzaprine (FLEXERIL) 10 MG tablet Take 1 tablet up to TID PRN. 90 tablet 2  . HYDROcodone-acetaminophen (NORCO/VICODIN) 5-325 MG per tablet Take 1 tablet by mouth every 4 (four) hours as needed for moderate pain. 120 tablet 0  . levothyroxine (SYNTHROID, LEVOTHROID) 50 MCG tablet Take 50 mcg by mouth daily before breakfast. TAKE ONE TABLET BY MOUTH EVERY DAY    . lisinopril (PRINIVIL,ZESTRIL) 40 MG tablet Take 40 mg by mouth daily. TAKE ONE TABLET BY MOUTH EVERY DAY    . LORazepam (ATIVAN) 0.5 MG tablet take 2 tablets by mouth every 8 hours if needed for anxiety or sleep 60 tablet 1  . magnesium oxide (MAG-OX) 400 MG tablet Take 1 tablet (400 mg total) by mouth daily. 90 tablet 1  . Multiple Vitamins-Minerals (MULTIVITAMIN WITH MINERALS) tablet Take 1 tablet by mouth daily.    Marland Kitchen NEXIUM 24HR 20 MG capsule take 1 capsule by mouth once daily at noon 28 capsule 1  . ondansetron (ZOFRAN) 8 MG tablet Take 1 tablet (8 mg total) by mouth every 8 (eight) hours as needed for nausea or vomiting. 30 tablet 4  . Probiotic Product (PROBIOTIC DAILY PO) Take by mouth daily.     . prochlorperazine (COMPAZINE) 10 MG tablet Take 1 tablet (10 mg total) by mouth every 6 (six) hours as needed for nausea or vomiting. 60 tablet 1  . sulfamethoxazole-trimethoprim (BACTRIM DS) 800-160 MG per tablet Take 1 tablet by mouth 3 (three) times a week. 14 tablet 6  . verapamil (COVERA HS) 180 MG (CO) 24 hr tablet Take 180 mg by mouth at bedtime.    . Vitamin D-Vitamin K (VITAMIN K2-VITAMIN D3) 45-2000 MCG-UNIT CAPS Take by mouth daily.    Marland Kitchen dexamethasone (DECADRON) 2 MG tablet Take 1 tablet (2 mg total) by mouth as directed. Take 4mg  by mouth daily  for 3 days, and decrease to 2mg  daily for 5 days, then every other day for additional 3 doses. (Patient not taking: Reported on 11/22/2014) 30 tablet 0  . furosemide  (LASIX) 20 MG tablet Take 1 tablet (20 mg total) by mouth every other day. (Patient not taking: Reported on 11/22/2014) 10 tablet 1  . levETIRAcetam (KEPPRA) 750 MG tablet Take 1 tablet (750 mg total) by mouth 2 (two) times daily. (Patient not taking: Reported on 11/22/2014) 60 tablet 0  . potassium chloride SA (K-DUR,KLOR-CON) 20 MEQ tablet Take 1 tablet (20 mEq total) by mouth 2 (two) times daily. (Patient not taking: Reported on 11/22/2014) 10 tablet 1  . temozolomide (TEMODAR) 100 MG capsule Take 3 capsules (300 mg total) by mouth daily. May take on an empty stomach or at bedtime to decrease nausea & vomiting. (Patient not taking: Reported on 11/22/2014) 75 capsule 0  .  temozolomide (TEMODAR) 180 MG capsule Take 1 capsule (180 mg total) by mouth daily. May take on an empty stomach or at bedtime to decrease nausea & vomiting. (Patient not taking: Reported on 11/22/2014) 25 capsule 0   No current facility-administered medications for this visit.    REVIEW OF SYSTEMS:   Constitutional: Denies fevers, chills or abnormal night sweats Eyes: Denies blurriness of vision, double vision or watery eyes Ears, nose, mouth, throat, and face: Denies mucositis or sore throat Respiratory: Denies cough, dyspnea or wheezes Cardiovascular: Denies palpitation, chest discomfort or lower extremity swelling Gastrointestinal:  Denies nausea, heartburn or change in bowel habits Skin: Denies abnormal skin rashes Lymphatics: Denies new lymphadenopathy or easy bruising Neurological:positive for mild headaches, and left side vision deficits. His left-sided weakness has nearly resolved. Denies numbness, tingling or new weaknesses Behavioral/Psych: Mood is stable, no new changes  All other systems were reviewed with the patient and are negative.  PHYSICAL EXAMINATION: ECOG PERFORMANCE STATUS: 1 - Symptomatic but completely ambulatory  KPS: 90%  Filed Vitals:   11/22/14 1053  BP: 118/65  Pulse: 110  Temp: 98.4 F (36.9  C)  Resp: 18   Filed Weights   11/22/14 1053  Weight: 233 lb (105.688 kg)    GENERAL:alert, no distress and comfortable SKIN: skin color, texture, turgor are normal, no rashes or significant lesions HEAD: right-sided posterior craniotomy surgical wound is healing well, no surrounding edema, skin redness or discharge EYES: normal, conjunctiva are pink and non-injected, sclera clear OROPHARYNX:no exudate, no erythema and lips, buccal mucosa, and tongue normal  NECK: supple, thyroid normal size, non-tender, without nodularity LYMPH:  no palpable lymphadenopathy in the cervical, axillary or inguinal LUNGS: clear to auscultation and percussion with normal breathing effort HEART: regular rate & rhythm and no murmurs and no lower extremity edema ABDOMEN:abdomen soft, non-tender and normal bowel sounds Musculoskeletal:no cyanosis of digits and no clubbing  PSYCH: alert & oriented x 3 with fluent speech NEURO: cranial nerve 2-12 are intact. Left side vision deficits is present in the left upper and mid quadrant. Motor and sensitivity are normal and symmetric on all extremities. Speech is fluent, coordination is grossly intact. Ambulates independently and Lutricia Feil sign negative. EXT: Bilateral pitting edema in ankles, better than before   LABORATORY DATA:  CBC Latest Ref Rng 11/22/2014 10/26/2014 10/20/2014  WBC 4.0 - 10.3 10e3/uL 5.6 5.6 5.0  Hemoglobin 13.0 - 17.1 g/dL 12.9(L) 12.1(L) 11.6(L)  Hematocrit 38.4 - 49.9 % 39.1 36.7(L) 35.1(L)  Platelets 140 - 400 10e3/uL 62(L) 365 330    CMP Latest Ref Rng 11/22/2014 10/26/2014 10/20/2014  Glucose 70 - 140 mg/dl 119 98 103  BUN 7.0 - 26.0 mg/dL 14.7 11.2 12.3  Creatinine 0.7 - 1.3 mg/dL 1.0 0.9 0.9  Sodium 136 - 145 mEq/L 136 135(L) 139  Potassium 3.5 - 5.1 mEq/L 4.1 3.9 4.1  Chloride 96 - 112 mEq/L - - -  CO2 22 - 29 mEq/L 21(L) 23 24  Calcium 8.4 - 10.4 mg/dL 9.7 9.8 9.9  Total Protein 6.4 - 8.3 g/dL 6.5 6.7 6.4  Total Bilirubin 0.20 -  1.20 mg/dL 0.83 0.64 0.77  Alkaline Phos 40 - 150 U/L 71 88 84  AST 5 - 34 U/L 23 32 30  ALT 0 - 55 U/L 30 33 36     Surgical path 07/07/2014  Diagnosis 1. Brain, biopsy - GLIOBLASTOMA MULTIFORME, WHO GRADE IV/IV. - SEE ONCOLOGY TABLE BELOW. 2. Brain, for tumor resection, higher right parietal - GLIOBLASTOMA MULTIFORME, WHO  GRADE IV/IV. - SEE ONCOLOGY TABLE BELOW. 3. Brain, for tumor resection, lower right occipital - GLIOBLASTOMA MULTIFORME, WHO GRADE IV/IV   Microscopic Comment 1. -3. ONCOLOGY TABLE - BRAIN AND SPINAL CORD 1. Procedure: Resection x2 2. Tumor site, including laterality: Right parietal and right occipital 3. Maximum tumor size (cm): At least 1.8 cm 4. Histologic type: Glioblastoma multiforme 5. Grade: WHO Grade IV/IV 6. Margins (if applicable): Can not be assessed 7. Ancillary studies: Can be performed upon clinician request.  RADIOGRAPHIC STUDIES: I have personally reviewed the radiological images as listed and agreed with the findings in the report.  Mr [name] Wo Contrast 07/08/2014    IMPRESSION:  1. Postoperative changes from interval right occipital craniotomy with resection of right parieto-occipital mass lesions.  2. 1.3 x 0.5 x 0.5 cm area of enhancement at the anterior margin of the more superior resection cavity in the right parieto-occipital region. It is unclear whether this reflects residual tumor or possibly normal enhancement of the choroid plexus in the adjacent compressed occipital horn of the right lateral ventricle. Attention on follow-up recommended.  3. Fairly linear enhancement about the more inferior resection cavity without definite evidence of residual tumor.  4. Small amount of associated postoperative infarct at the medial aspect of the more superior resection site.  5. Stable 6 mm focus of restricted diffusion in the left frontal lobe, compatible with an acute or subacute infarct.  6. Slightly improved vasogenic edema within the right  parieto-occipital region with persistent 7 mm of right-to-left midline shift.     Electronically Signed   By: Jeannine Boga M.D.   On: 07/08/2014 23:45   10/14/2014 Mr Brain W [facility] Contrast  IMPRESSION: Two discrete foci of enhancement in the RIGHT occipital lobe surround the larger resection cavity, and are suspected to represent foci of residual tumor/GBM displaying interval growth. Recommend continued short-term follow-up..      ASSESSMENT & PLAN:  Mr. Goto is a 59 year old gentleman with newly diagnosed right parietal GBM, status post subtotal resection. He has recovered very well, except mild headaches and left-sided vision deficits. He has finished concurrent chemoradiation or week ago.  1. GBM, s/p subtotal resection, and concurrent chemoRT completed on 09/15/2014 --He tolerated the first two cycle adjuvant Temodar well. -I reviewed his restaging brain MRI, which showed 2 small foci of enhancement around the surgical site, possible residual disease. This was discussed in the neuro tumor Board and the consensus was repeating brain scan in 3 months and continue adjuvant chemotherapy. -His platelet count is 62K today, has not recovered from chemotherapy. We'll postpone his next cycle of Temodar onto his plate count recovers. -cont bactrim DS 1 tab every M, W, F for PCP prophylaxis.  -cont Nexium daily when he is on steroids, vitamin the vitamin D and calcium, and a regular exercise. -Follow up with radiation oncology Dr. Isidore Moos  2. Anorexia, fatigue, nausea -Likely secondary to Temodar, and being off steroids. -Follow up with dietitian. Use Zofran as needed.  Plan: -Start third cycle Temodar by his. Count recovers -repeat CBC in one week, 4 week and before next cycle in 5 weeks -Decreased Temodar by 20% to 160 mg/m for cycle 3 (round to 360mg  daily, for 5 days), prescription sent to [facility]   All questions were answered. The patient knows to call the clinic  with any problems, questions or concerns.  I spent 20 minutes counseling the patient face to face. The total time spent in the appointment was 25  minutes and more than 50% was on counseling.     Truitt Merle, MD 11/22/2014 11:19 AM

## 2014-11-25 ENCOUNTER — Encounter: Payer: Self-pay | Admitting: Hematology

## 2014-11-25 MED ORDER — TEMOZOLOMIDE 180 MG PO CAPS: 360.0000 mg | ORAL_CAPSULE | Freq: Every day | ORAL | Status: DC

## 2014-11-28 ENCOUNTER — Encounter: Payer: Self-pay | Admitting: Hematology

## 2014-11-28 NOTE — Progress Notes (Signed)
I faxed prior auth for Temodar to [facility]

## 2014-11-29 ENCOUNTER — Encounter: Payer: Self-pay | Admitting: Hematology

## 2014-11-29 ENCOUNTER — Other Ambulatory Visit: Payer: Self-pay | Admitting: *Deleted

## 2014-11-29 ENCOUNTER — Telehealth: Payer: Self-pay | Admitting: *Deleted

## 2014-11-29 MED ORDER — TEMOZOLOMIDE 180 MG PO CAPS: 360.0000 mg | ORAL_CAPSULE | Freq: Every day | ORAL | Status: DC

## 2014-11-29 NOTE — Progress Notes (Signed)
I sent staff message to nurse/dr for scrip for temodar to send to [facility].

## 2014-11-29 NOTE — Telephone Encounter (Signed)
Received fax from [facility].  Stating that PA approved but [facility] needs to [facility] @ [phone].  Called script for temozolomide 180 mg tabs, 2 capsules daily x 5 days to [facility] & notified pt to call to work out delivery.

## 2014-11-29 NOTE — Progress Notes (Signed)
Per [employer] temodar is approved 11/28/14-11/28/15  [id number]  180mg  capsule  10/5  Take 2 capsules by mouth daily for 5days, then stop. May take on an empty stomach or at bedtime to decrease nausea and vomiting.

## 2014-11-30 ENCOUNTER — Encounter: Payer: Self-pay | Admitting: Hematology

## 2014-11-30 ENCOUNTER — Other Ambulatory Visit (HOSPITAL_BASED_OUTPATIENT_CLINIC_OR_DEPARTMENT_OTHER): Payer: 59

## 2014-11-30 DIAGNOSIS — C714 Malignant neoplasm of occipital lobe: Secondary | ICD-10-CM | POA: Diagnosis not present

## 2014-11-30 DIAGNOSIS — C713 Malignant neoplasm of parietal lobe: Secondary | ICD-10-CM

## 2014-11-30 DIAGNOSIS — C719 Malignant neoplasm of brain, unspecified: Secondary | ICD-10-CM

## 2014-11-30 LAB — CBC WITH DIFFERENTIAL/PLATELET
BASO%: 0.4 % (ref 0.0–2.0)
Basophils Absolute: 0 10*3/uL (ref 0.0–0.1)
EOS%: 2.2 % (ref 0.0–7.0)
Eosinophils Absolute: 0.1 10*3/uL (ref 0.0–0.5)
HCT: 39.1 % (ref 38.4–49.9)
HGB: 12.8 g/dL — ABNORMAL LOW (ref 13.0–17.1)
LYMPH%: 22.2 % (ref 14.0–49.0)
MCH: 30.3 pg (ref 27.2–33.4)
MCHC: 32.7 g/dL (ref 32.0–36.0)
MCV: 92.7 fL (ref 79.3–98.0)
MONO#: 0.7 10*3/uL (ref 0.1–0.9)
MONO%: 13.8 % (ref 0.0–14.0)
NEUT%: 61.4 % (ref 39.0–75.0)
NEUTROS ABS: 3 10*3/uL (ref 1.5–6.5)
PLATELETS: 265 10*3/uL (ref 140–400)
RBC: 4.22 10*6/uL (ref 4.20–5.82)
RDW: 15.8 % — ABNORMAL HIGH (ref 11.0–14.6)
WBC: 4.9 10*3/uL (ref 4.0–10.3)
lymph#: 1.1 10*3/uL (ref 0.9–3.3)

## 2014-11-30 LAB — COMPREHENSIVE METABOLIC PANEL (CC13)
ALT: 32 U/L (ref 0–55)
AST: 24 U/L (ref 5–34)
Albumin: 3.5 g/dL (ref 3.5–5.0)
Alkaline Phosphatase: 76 U/L (ref 40–150)
Anion Gap: 12 mEq/L — ABNORMAL HIGH (ref 3–11)
BILIRUBIN TOTAL: 0.5 mg/dL (ref 0.20–1.20)
BUN: 11.7 mg/dL (ref 7.0–26.0)
CO2: 24 meq/L (ref 22–29)
Calcium: 9.4 mg/dL (ref 8.4–10.4)
Chloride: 104 mEq/L (ref 98–109)
Creatinine: 1 mg/dL (ref 0.7–1.3)
EGFR: 84 mL/min/{1.73_m2} — ABNORMAL LOW (ref >90)
Glucose: 124 mg/dl (ref 70–140)
POTASSIUM: 4.3 meq/L (ref 3.5–5.1)
Sodium: 140 mEq/L (ref 136–145)
Total Protein: 6.5 g/dL (ref 6.4–8.3)

## 2014-11-30 NOTE — Progress Notes (Signed)
See prev notes. Nurse was instructed where to send scrip for temodar.

## 2014-12-13 ENCOUNTER — Ambulatory Visit
Admission: RE | Admit: 2014-12-13 | Discharge: 2014-12-13 | Disposition: A | Discharge status: Home / Self Care | Payer: 59 | Source: Ambulatory Visit | Attending: Radiation Oncology | Admitting: Radiation Oncology

## 2014-12-13 DIAGNOSIS — C714 Malignant neoplasm of occipital lobe: Secondary | ICD-10-CM

## 2014-12-13 MED ORDER — GADOBENATE DIMEGLUMINE 529 MG/ML IV SOLN
20.0000 mL | Freq: Once | INTRAVENOUS | Status: AC | PRN
  Administered 2014-12-13: 20 mL via INTRAVENOUS

## 2014-12-14 ENCOUNTER — Encounter: Payer: Self-pay | Admitting: Radiation Oncology

## 2014-12-14 ENCOUNTER — Ambulatory Visit
Admission: RE | Admit: 2014-12-14 | Discharge: 2014-12-14 | Disposition: A | Discharge status: Home / Self Care | Payer: 59 | Source: Ambulatory Visit | Attending: Radiation Oncology | Admitting: Radiation Oncology

## 2014-12-14 VITALS — BP 122/76 | HR 96 | Temp 98.0°F | Resp 16 | Wt 228.1 lb

## 2014-12-14 DIAGNOSIS — C714 Malignant neoplasm of occipital lobe: Secondary | ICD-10-CM

## 2014-12-14 MED ORDER — LORAZEPAM 0.5 MG PO TABS: ORAL_TABLET | ORAL | Status: DC

## 2014-12-14 MED ORDER — DEXAMETHASONE 4 MG PO TABS: ORAL_TABLET | ORAL | Status: DC

## 2014-12-14 NOTE — Progress Notes (Signed)
Radiation Oncology         [phone] ________________________________  Name: Filimon Miranda MRN: [id number]  Date: 12/14/2014  DOB: 1956-03-10  Follow-Up Visit Note  Outpatient  CC: [name], MD  [name], MD  Diagnosis and Prior Radiotherapy:    ICD-9-CM ICD-10-CM   1. Glioblastoma of occipital lobe 191.4 C71.4 dexamethasone (DECADRON) 4 MG tablet     LORazepam (ATIVAN) 0.5 MG tablet    Glioblastoma of parieto-occipital region   Indication for treatment: curative  (with Temodar)    Radiation treatment dates:   08/03/2014-09/15/2014  Site/dose:   1) Right occipito-parietal brain / 46 Gy in 23 fractions 2) Right brain boost / 14 Gy in 7 fractions   Narrative:  The patient returns today for routine follow-up.   Patient accompanied by his wife to obtain MRI results. Slow steady gait noted. Pleasant affect noted. Patient denies pain. Reports vision in right eye continues to be "fuzzy" but, no worse since February. Reports he continues to have lose of peripheral vision in left eye. Reports that he has an occasional headache which resolves on it own typically. Reports on a rare occasion he has to take hydrocodone to relieve severe headaches but, hasn't had "one of those in at least three weeks." Completed second decadron taper ~3-4 weeks ago. Continues to take Temodar every four weeks. Reports he continuously feels nauseated. Reports his appetite has decreased significantly due to persistent nausea. Patient has lost 20 lb since February follow up. Reports drinking 2-3 ensure per day. Reports he is schedule to follow up with PCP next month for routine exam. Vitals stable. Denies ringing in th ears. Requesting refill of ATIVAN. Constipated. Ativan helps sleep at night.   ALLERGIES:  has No Known Allergies.  Meds: Current Outpatient Prescriptions  Medication Sig Dispense Refill  . b complex vitamins tablet Take 1 tablet by mouth daily.    . cyclobenzaprine (FLEXERIL) 10  MG tablet Take 1 tablet up to TID PRN. 90 tablet 2  . HYDROcodone-acetaminophen (NORCO/VICODIN) 5-325 MG per tablet Take 1 tablet by mouth every 4 (four) hours as needed for moderate pain. 120 tablet 0  . levothyroxine (SYNTHROID, LEVOTHROID) 50 MCG tablet Take 50 mcg by mouth daily before breakfast. TAKE ONE TABLET BY MOUTH EVERY DAY    . lisinopril (PRINIVIL,ZESTRIL) 40 MG tablet Take 40 mg by mouth daily. TAKE ONE TABLET BY MOUTH EVERY DAY    . LORazepam (ATIVAN) 0.5 MG tablet Take 1-2 tablets every 8 hrs prn anxiety or nausea; may take 3 tablets for evening dose. 120 tablet 0  . magnesium oxide (MAG-OX) 400 MG tablet Take 1 tablet (400 mg total) by mouth daily. 90 tablet 1  . Multiple Vitamins-Minerals (MULTIVITAMIN WITH MINERALS) tablet Take 1 tablet by mouth daily.    Marland Kitchen omeprazole (PRILOSEC) 20 MG capsule Take 1 capsule (20 mg total) by mouth daily. 30 capsule 3  . ondansetron (ZOFRAN) 8 MG tablet Take 1 tablet (8 mg total) by mouth every 8 (eight) hours as needed for nausea or vomiting. 30 tablet 4  . Probiotic Product (PROBIOTIC DAILY PO) Take by mouth daily.     . prochlorperazine (COMPAZINE) 10 MG tablet Take 1 tablet (10 mg total) by mouth every 6 (six) hours as needed for nausea or vomiting. 60 tablet 1  . sulfamethoxazole-trimethoprim (BACTRIM DS) 800-160 MG per tablet Take 1 tablet by mouth 3 (three) times a week. 14 tablet 6  . temozolomide (TEMODAR) 180 MG capsule Take 2 capsules (360 mg  total) by mouth daily. May take on an empty stomach or at bedtime to decrease nausea & vomiting. 10 capsule 0  . verapamil (COVERA HS) 180 MG (CO) 24 hr tablet Take 180 mg by mouth at bedtime.    . Vitamin D-Vitamin K (VITAMIN K2-VITAMIN D3) 45-2000 MCG-UNIT CAPS Take by mouth daily.    Marland Kitchen dexamethasone (DECADRON) 4 MG tablet If nausea persists after resolving constipation, take 4mg  TID for 1 week, then 4mg  BID for 1 week, then 4mg  daily thereafter. 60 tablet 0   No current facility-administered  medications for this encounter.    Physical Findings: The patient is in no acute distress. Patient is alert and oriented.  weight is 228 lb 1.6 oz (103.465 kg). His oral temperature is 98 F (36.7 C). His blood pressure is 122/76 and his pulse is 96. His respiration is 16 and oxygen saturation is 100%. .   General NAD,  HEENT no thrush,  Visual deficit in left upper and lower quadrants for both eyes, coordination intact, strength intact, gait stable;  Scalp skin intact and smooth.  Lungs CTAB,  Heart RRR, Abdomen soft, nontender. Psychiatric: judgment intact   Lab Findings: Lab Results  Component Value Date   WBC 4.9 11/30/2014   HGB 12.8* 11/30/2014   HCT 39.1 11/30/2014   MCV 92.7 11/30/2014   PLT 265 11/30/2014    Radiographic Findings: Mr [name] EQ Contrast  12/13/2014   CLINICAL DATA:  Glioblastoma. Prior surgery in 07/2014. Radiation therapy completed in 09/2014. Temodar chemotherapy.  EXAM: MRI HEAD WITHOUT AND WITH CONTRAST  TECHNIQUE: Multiplanar, multiecho pulse sequences of the brain and surrounding structures were obtained without and with intravenous contrast.  CONTRAST:  60mL MULTIHANCE GADOBENATE DIMEGLUMINE 529 MG/ML IV SOLN  COMPARISON:  10/14/2014  FINDINGS: Sequelae of right occipital craniotomy are again identified. Trace extra-axial fluid remains at the craniotomy site along with mild postoperative dural thickening and enhancement. The underlying resection cavity is similar in size to the prior study, measuring approximately 2.4 x 2.2 cm, with associated chronic blood products again noted. Lesion anterior to the resection cavity measures 11 x 10 mm and demonstrates increased thickness of peripheral enhancement (series 10, image 29), with enhancement now measuring 3-4 mm in thickness as opposed to 2 mm on the prior study. Associated abnormal diffusion is again noted.  Lesion inferior and lateral to the resection cavity has increased in size with significantly  increased thickness of peripheral enhancement, greatest medially. This lesion now measures 1.7 x 1.7 cm (series 10, image 23). No new areas of abnormal enhancement are identified. Confluent T2 hyperintensity in the white matter surrounding the resection cavity has mildly increased from the prior study without significant mass effect.  There is mild generalized cerebral atrophy. There is no midline shift.  Orbits are unremarkable. There is mild bilateral anterior ethmoid air cell mucosal thickening. Mastoid air cells are clear. Major intracranial vascular flow voids are preserved.  IMPRESSION: Increased size/ enhancement of 2 lesions adjacent to the right occipital resection cavity, concerning for growth of residual tumor. Mildly increased surrounding T2 hyperintensity.   Electronically Signed   By: Logan Bores   On: 12/13/2014 13:42    Impression/Plan:  Discussed MRI in depth.  I favor there is some residual active disease in the two enhancing nodules.  Subtle increase in edema.  1) will review at tumor board. Will discuss if resection or Avastin are prudent if consensus is that pt has progression  2) Discussed ways to manage  constipation in case this helps his nausea.  Also gave Ativan Rx for nausea. Other meds are not helping. Finally, I gave a Rx for a Steroid taper that is higher dose than his previous taper if first two measures don't alleviate severe nausea.  3) pt will call at 4pm on 4-18 if he doesn't hear from me re: tumor board consensus.  4) wife is tearful.  This is all very stressful for them. Will notify Social Work.    _____________________________________   Eppie Gibson, MD

## 2014-12-14 NOTE — Progress Notes (Signed)
Patient accompanied by his wife to obtain MRI results. Slow steady gait noted. Pleasant affect noted. Patient denies pain. Reports vision in right eye continues to be "fuzzy" but, no worse since February. Reports he continues to have lose of peripheral vision in left eye. Reports that he has an occasional headache which resolves on it own typically. Reports on a rare occasion he has to take hydrocodone to relieve severe headaches but, hasn't had "one of those in at least three weeks." Completed second decadron taper 4 weeks ago. Continues to take Temodar every four weeks. Reports he continuously feels nauseated. Reports his appetite has decreased significantly due to persistent nausea. Patient has lost 20 lb since February follow up. Reports drinking 2-3 ensure per day. Reports he is schedule to follow up with PCP next month for routine exam. Vitals stable. Denies ringing in th ears. Requesting refill of ATIVAN.

## 2014-12-15 ENCOUNTER — Encounter: Payer: Self-pay | Admitting: *Deleted

## 2014-12-15 ENCOUNTER — Telehealth: Payer: Self-pay | Admitting: *Deleted

## 2014-12-15 NOTE — Telephone Encounter (Signed)
CALLED G. HOCK TO ASK HER TO CALL Gunter Buechler AND HIS WIFE DUE TO DISTRESS, LVM FOR A RETURN CALL

## 2014-12-15 NOTE — Progress Notes (Signed)
[facility]  Clinical Social Work was referred by Pension scheme manager for assessment of psychosocial needs.  Clinical Social Worker called patient at home to offer support and assess for needs. Pt shared updated medical concerns and finances/expenses. Pt really needs assistance with medical copays. CSW to reach out to financial advocates to try to reduce this concern. CSW provided supportive listening and emotional support. Wife at work, but CSW to reach out to her tomorrow for support as well.   Clinical Social Work interventions: Resource education Supportive listening  Loren Racer, [place] Worker [place]  [place] Phone: [phone] Fax: [phone]

## 2014-12-19 ENCOUNTER — Telehealth: Payer: Self-pay | Admitting: Hematology

## 2014-12-19 ENCOUNTER — Other Ambulatory Visit: Payer: Self-pay | Admitting: Radiation Oncology

## 2014-12-19 ENCOUNTER — Other Ambulatory Visit: Payer: Self-pay | Admitting: Nurse Practitioner

## 2014-12-19 DIAGNOSIS — C714 Malignant neoplasm of occipital lobe: Secondary | ICD-10-CM

## 2014-12-19 DIAGNOSIS — R110 Nausea: Secondary | ICD-10-CM

## 2014-12-19 MED ORDER — SUCRALFATE 1 G PO TABS: ORAL_TABLET | ORAL | Status: DC

## 2014-12-19 NOTE — Telephone Encounter (Signed)
Called patient and he is aware of his new appointment °

## 2014-12-21 ENCOUNTER — Other Ambulatory Visit (HOSPITAL_BASED_OUTPATIENT_CLINIC_OR_DEPARTMENT_OTHER): Payer: 59

## 2014-12-21 DIAGNOSIS — C714 Malignant neoplasm of occipital lobe: Secondary | ICD-10-CM | POA: Diagnosis not present

## 2014-12-21 DIAGNOSIS — C719 Malignant neoplasm of brain, unspecified: Secondary | ICD-10-CM

## 2014-12-21 DIAGNOSIS — C713 Malignant neoplasm of parietal lobe: Secondary | ICD-10-CM | POA: Diagnosis not present

## 2014-12-21 LAB — CBC WITH DIFFERENTIAL/PLATELET
BASO%: 0 % (ref 0.0–2.0)
Basophils Absolute: 0 10*3/uL (ref 0.0–0.1)
EOS%: 0 % (ref 0.0–7.0)
Eosinophils Absolute: 0 10*3/uL (ref 0.0–0.5)
HCT: 38.3 % — ABNORMAL LOW (ref 38.4–49.9)
HGB: 13 g/dL (ref 13.0–17.1)
LYMPH#: 0.8 10*3/uL — AB (ref 0.9–3.3)
LYMPH%: 10.9 % — ABNORMAL LOW (ref 14.0–49.0)
MCH: 30.2 pg (ref 27.2–33.4)
MCHC: 33.9 g/dL (ref 32.0–36.0)
MCV: 88.9 fL (ref 79.3–98.0)
MONO#: 0.6 10*3/uL (ref 0.1–0.9)
MONO%: 8 % (ref 0.0–14.0)
NEUT%: 81.1 % — ABNORMAL HIGH (ref 39.0–75.0)
NEUTROS ABS: 6.3 10*3/uL (ref 1.5–6.5)
NRBC: 0 % (ref 0–0)
Platelets: 222 10*3/uL (ref 140–400)
RBC: 4.31 10*6/uL (ref 4.20–5.82)
RDW: 15.4 % — ABNORMAL HIGH (ref 11.0–14.6)
WBC: 7.7 10*3/uL (ref 4.0–10.3)

## 2014-12-21 LAB — COMPREHENSIVE METABOLIC PANEL (CC13)
ALK PHOS: 65 U/L (ref 40–150)
ALT: 30 U/L (ref 0–55)
AST: 22 U/L (ref 5–34)
Albumin: 3.7 g/dL (ref 3.5–5.0)
Anion Gap: 16 mEq/L — ABNORMAL HIGH (ref 3–11)
BUN: 17.9 mg/dL (ref 7.0–26.0)
CO2: 17 meq/L — AB (ref 22–29)
CREATININE: 0.8 mg/dL (ref 0.7–1.3)
Calcium: 9.7 mg/dL (ref 8.4–10.4)
Chloride: 103 mEq/L (ref 98–109)
EGFR: >90 mL/min/{1.73_m2} (ref >90)
Glucose: 122 mg/dl (ref 70–140)
Potassium: 4.5 mEq/L (ref 3.5–5.1)
SODIUM: 136 meq/L (ref 136–145)
TOTAL PROTEIN: 6.7 g/dL (ref 6.4–8.3)
Total Bilirubin: 0.37 mg/dL (ref 0.20–1.20)

## 2014-12-28 ENCOUNTER — Ambulatory Visit: Payer: 59 | Admitting: Nurse Practitioner

## 2014-12-28 ENCOUNTER — Ambulatory Visit (HOSPITAL_BASED_OUTPATIENT_CLINIC_OR_DEPARTMENT_OTHER): Payer: 59 | Admitting: Hematology

## 2014-12-28 ENCOUNTER — Other Ambulatory Visit (HOSPITAL_BASED_OUTPATIENT_CLINIC_OR_DEPARTMENT_OTHER): Payer: 59

## 2014-12-28 ENCOUNTER — Other Ambulatory Visit: Payer: 59

## 2014-12-28 ENCOUNTER — Ambulatory Visit: Payer: 59 | Admitting: Physician Assistant

## 2014-12-28 VITALS — BP 109/67 | HR 86 | Temp 98.1°F | Resp 18 | Ht 71.0 in | Wt 224.6 lb

## 2014-12-28 DIAGNOSIS — C714 Malignant neoplasm of occipital lobe: Secondary | ICD-10-CM

## 2014-12-28 DIAGNOSIS — R5383 Other fatigue: Secondary | ICD-10-CM

## 2014-12-28 DIAGNOSIS — R630 Anorexia: Secondary | ICD-10-CM | POA: Diagnosis not present

## 2014-12-28 DIAGNOSIS — C713 Malignant neoplasm of parietal lobe: Secondary | ICD-10-CM

## 2014-12-28 DIAGNOSIS — R110 Nausea: Secondary | ICD-10-CM

## 2014-12-28 DIAGNOSIS — C719 Malignant neoplasm of brain, unspecified: Secondary | ICD-10-CM

## 2014-12-28 LAB — CBC WITH DIFFERENTIAL/PLATELET
BASO%: 0.3 % (ref 0.0–2.0)
Basophils Absolute: 0 10*3/uL (ref 0.0–0.1)
EOS%: 0.1 % (ref 0.0–7.0)
Eosinophils Absolute: 0 10*3/uL (ref 0.0–0.5)
HCT: 42 % (ref 38.4–49.9)
HEMOGLOBIN: 13.7 g/dL (ref 13.0–17.1)
LYMPH#: 0.7 10*3/uL — AB (ref 0.9–3.3)
LYMPH%: 5.6 % — AB (ref 14.0–49.0)
MCH: 28.9 pg (ref 27.2–33.4)
MCHC: 32.5 g/dL (ref 32.0–36.0)
MCV: 88.9 fL (ref 79.3–98.0)
MONO#: 0.5 10*3/uL (ref 0.1–0.9)
MONO%: 4.2 % (ref 0.0–14.0)
NEUT#: 11.2 10*3/uL — ABNORMAL HIGH (ref 1.5–6.5)
NEUT%: 89.8 % — AB (ref 39.0–75.0)
Platelets: 150 10*3/uL (ref 140–400)
RBC: 4.73 10*6/uL (ref 4.20–5.82)
RDW: 17.2 % — AB (ref 11.0–14.6)
WBC: 12.5 10*3/uL — ABNORMAL HIGH (ref 4.0–10.3)

## 2014-12-28 LAB — TECHNOLOGIST REVIEW

## 2014-12-29 ENCOUNTER — Encounter: Payer: Self-pay | Admitting: Hematology

## 2014-12-29 NOTE — Progress Notes (Signed)
[facility] NOTE  Patient Care Team: [name], MD as PCP - General (Family Medicine) Kary Kos, MD as Consulting Physician (Neurosurgery) Eppie Gibson, MD as Attending Physician (Radiation Oncology) Truitt Merle, MD as Consulting Physician (Hematology)     Glioblastoma of occipital lobe   07/02/2014 Imaging Brain MRI: Two adjacent enhancing mass lesions in the right parietal lobe with central necrosis and extensive surrounding edema and mass effect.  7 mm midline shift to the left.  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   CT CAP(-)   07/07/2014 Surgery Subtotal right parietal tumors resection    08/03/2014 Concurrent Chemotherapy Concurrent radiation with Temodar 75 mg/m daily, completed on 09/15/2014.     GBM (glioblastoma multiforme)   07/22/2014 Initial Diagnosis GBM (glioblastoma multiforme)    CURRENT THERAPY: Adjuvant chemo Temodar 150mg /m2, started on 09/29/2014  CHIEF COMPLAINTS: follow up GBM   HISTORY OF INITIAL PRESENTATION;   He initially presented was headaches and sinus congestion in mid October 2015. He was seen by his primary care physician and was treated for sinus infection. He subsequently developed left sided visual deficit, left arm and leg weakness and unsteady gait. He was sent to emergency room by his primary care physician on 07/02/2014. CT  And MRI of brain reviewed to add adjacent enhancing mass lesions in the right parietal lobe with central necrosis and extensive surrounding edema and mass effect. He was admitted and underwent right craniotomy  For resection of the 2 brain masses by Dr. Lonell Grandchild. Postsurgical brain MRI revealed a 1.3 cm area of enhancement at the anterior margin of surgical resection cavity, likely surgical change. His case was reviewed in the tumor board and it was felt he had subtotal resection.  INTERVAL HISTROY: Jabron returns for follow-up. He tolerated this is cycle Temodar well overall. He did have  a bit more nausea, as started having significant fatigue and mild headache again after he was weaned off dexamethasone. So Dr. Isidore Moos restart his dexamethasone again a few weeks ago. His nausea and headaches have improved. His vision is stable, loss of left vision is unchanged. No other new symptoms. He functions well at home.  MEDICAL HISTORY:  Past Medical History  Diagnosis Date  . Thyroid disease   . Hypertension   . Allergy   . Arthritis   .    Marland Kitchen Kidney stone     SURGICAL HISTORY: Past Surgical History  Procedure Laterality Date  . Knee arthroscopy w/ meniscal repair    . Treatment fistula anal    . 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 Marital Status: Married    Spouse Name: N/A    Number of Children: 5 children, age of 26-21  . Years of Education: N/A   Occupational History  . 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    Social History Main Topics  . Smoking status: Current Every Day Smoker -- 1.00 packs/day for 45 years    Types: Cigarettes  . Smokeless tobacco: Never Used  . Alcohol Use: Yes     Comment: 2-3 beers per/day   . Drug Use: No  . Sexual Activity: Not on file     FAMILY HISTORY: Family History  Problem Relation Age of Onset  . Hypertension  Mother   . Diabetes Father   . Glaucoma Father   . Diabetes Brother   . Diabetes Brother   . Colon cancer Neg Hx   Paternal ancle had prostate cancer. No other family history of malignancy.  ALLERGIES:  has No Known Allergies.   MEDICATIONS:  Current Outpatient Prescriptions  Medication Sig Dispense Refill  . b complex vitamins tablet Take 1 tablet by mouth daily.    . cyclobenzaprine (FLEXERIL) 10 MG tablet Take 1 tablet up to TID PRN. 90 tablet 2  . dexamethasone (DECADRON) 4 MG tablet If  nausea persists after resolving constipation, take 4mg  TID for 1 week, then 4mg  BID for 1 week, then 4mg  daily thereafter. (Patient taking differently: Take 2 mg by mouth daily. If nausea persists after resolving constipation, take 4mg  TID for 1 week, then 4mg  BID for 1 week, then 4mg  daily thereafter.) 60 tablet 0  . HYDROcodone-acetaminophen (NORCO/VICODIN) 5-325 MG per tablet Take 1 tablet by mouth every 4 (four) hours as needed for moderate pain. 120 tablet 0  . levothyroxine (SYNTHROID, LEVOTHROID) 50 MCG tablet Take 50 mcg by mouth daily before breakfast. TAKE ONE TABLET BY MOUTH EVERY DAY    . lisinopril (PRINIVIL,ZESTRIL) 40 MG tablet Take 40 mg by mouth daily. TAKE ONE TABLET BY MOUTH EVERY DAY    . LORazepam (ATIVAN) 0.5 MG tablet Take 1-2 tablets every 8 hrs prn anxiety or nausea; may take 3 tablets for evening dose. 120 tablet 0  . magnesium oxide (MAG-OX) 400 MG tablet Take 1 tablet (400 mg total) by mouth daily. 90 tablet 1  . Multiple Vitamins-Minerals (MULTIVITAMIN WITH MINERALS) tablet Take 1 tablet by mouth daily.    Marland Kitchen NEXIUM 24HR 20 MG capsule   0  . Probiotic Product (PROBIOTIC DAILY PO) Take by mouth daily.     . sucralfate (CARAFATE) 1 G tablet Dissolve 1 tablet in 10 mL H20 and swallow 30 min prior to meals and bedtime as needed to prevent nausea 60 tablet 5  . sulfamethoxazole-trimethoprim (BACTRIM DS) 800-160 MG per tablet Take 1 tablet by mouth 3 (three) times a week. 14 tablet 6  . verapamil (COVERA HS) 180 MG (CO) 24 hr tablet Take 180 mg by mouth at bedtime.    . Vitamin D-Vitamin K (VITAMIN K2-VITAMIN D3) 45-2000 MCG-UNIT CAPS Take by mouth daily.    . ondansetron (ZOFRAN) 8 MG tablet Take 1 tablet (8 mg total) by mouth every 8 (eight) hours as needed for nausea or vomiting. (Patient not taking: Reported on 12/28/2014) 30 tablet 4  . prochlorperazine (COMPAZINE) 10 MG tablet Take 1 tablet (10 mg total) by mouth every 6 (six) hours as needed for nausea or vomiting. (Patient  not taking: Reported on 12/28/2014) 60 tablet 1  . temozolomide (TEMODAR) 180 MG capsule Take 2 capsules (360 mg total) by mouth daily. May take on an empty stomach or at bedtime to decrease nausea & vomiting. (Patient not taking: Reported on 12/28/2014) 10 capsule 0   No current facility-administered medications for this visit.    REVIEW OF SYSTEMS:   Constitutional: Denies fevers, chills or abnormal night sweats Eyes: Denies blurriness of vision, double vision or watery eyes Ears, nose, mouth, throat, and face: Denies mucositis or sore throat Respiratory: Denies cough, dyspnea or wheezes Cardiovascular: Denies palpitation, chest discomfort or lower extremity swelling Gastrointestinal:  Denies nausea, heartburn or change in bowel habits Skin: Denies abnormal skin rashes Lymphatics: Denies new lymphadenopathy or easy bruising Neurological:positive for mild headaches, and  left side vision deficits. His left-sided weakness has nearly resolved. Denies numbness, tingling or new weaknesses Behavioral/Psych: Mood is stable, no new changes  All other systems were reviewed with the patient and are negative.  PHYSICAL EXAMINATION: ECOG PERFORMANCE STATUS: 1 - Symptomatic but completely ambulatory  KPS: 80%  Filed Vitals:   12/28/14 1150  BP: 109/67  Pulse: 86  Temp: 98.1 F (36.7 C)  Resp: 18   Filed Weights   12/28/14 1150  Weight: 224 lb 9.6 oz (101.878 kg)    GENERAL:alert, no distress and comfortable SKIN: skin color, texture, turgor are normal, no rashes or significant lesions HEAD: right-sided posterior craniotomy surgical wound is healing well, no surrounding edema, skin redness or discharge EYES: normal, conjunctiva are pink and non-injected, sclera clear OROPHARYNX:no exudate, no erythema and lips, buccal mucosa, and tongue normal  NECK: supple, thyroid normal size, non-tender, without nodularity LYMPH:  no palpable lymphadenopathy in the cervical, axillary or inguinal LUNGS:  clear to auscultation and percussion with normal breathing effort HEART: regular rate & rhythm and no murmurs and no lower extremity edema ABDOMEN:abdomen soft, non-tender and normal bowel sounds Musculoskeletal:no cyanosis of digits and no clubbing  PSYCH: alert & oriented x 3 with fluent speech NEURO: cranial nerve 2-12 are intact. Left side vision deficits is present in the left upper and mid quadrant. Motor and sensitivity are normal and symmetric on all extremities. Speech is fluent, coordination is grossly intact. Ambulates independently and Lutricia Feil sign negative. EXT: Bilateral pitting edema in ankles, better than before   LABORATORY DATA:  CBC Latest Ref Rng 12/28/2014 12/21/2014 11/30/2014  WBC 4.0 - 10.3 10e3/uL 12.5(H) 7.7 4.9  Hemoglobin 13.0 - 17.1 g/dL 13.7 13.0 12.8(L)  Hematocrit 38.4 - 49.9 % 42.0 38.3(L) 39.1  Platelets 140 - 400 10e3/uL 150 222 265    CMP Latest Ref Rng 12/21/2014 11/30/2014 11/22/2014  Glucose 70 - 140 mg/dl 122 124 119  BUN 7.0 - 26.0 mg/dL 17.9 11.7 14.7  Creatinine 0.7 - 1.3 mg/dL 0.8 1.0 1.0  Sodium 136 - 145 mEq/L 136 140 136  Potassium 3.5 - 5.1 mEq/L 4.5 4.3 4.1  Chloride 96 - 112 mEq/L - - -  CO2 22 - 29 mEq/L 17(L) 24 21(L)  Calcium 8.4 - 10.4 mg/dL 9.7 9.4 9.7  Total Protein 6.4 - 8.3 g/dL 6.7 6.5 6.5  Total Bilirubin 0.20 - 1.20 mg/dL 0.37 0.50 0.83  Alkaline Phos 40 - 150 U/L 65 76 71  AST 5 - 34 U/L 22 24 23   ALT 0 - 55 U/L 30 32 30     Surgical path 07/07/2014  Diagnosis 1. Brain, biopsy - GLIOBLASTOMA MULTIFORME, WHO GRADE IV/IV. - SEE ONCOLOGY TABLE BELOW. 2. Brain, for tumor resection, higher right parietal - GLIOBLASTOMA MULTIFORME, WHO GRADE IV/IV. - SEE ONCOLOGY TABLE BELOW. 3. Brain, for tumor resection, lower right occipital - GLIOBLASTOMA MULTIFORME, WHO GRADE IV/IV   Microscopic Comment 1. -3. ONCOLOGY TABLE - BRAIN AND SPINAL CORD 1. Procedure: Resection x2 2. Tumor site, including laterality: Right parietal and  right occipital 3. Maximum tumor size (cm): At least 1.8 cm 4. Histologic type: Glioblastoma multiforme 5. Grade: WHO Grade IV/IV 6. Margins (if applicable): Can not be assessed 7. Ancillary studies: Can be performed upon clinician request.  RADIOGRAPHIC STUDIES: I have personally reviewed the radiological images as listed and agreed with the findings in the report.  Mr [name] Wo Contrast 07/08/2014    IMPRESSION:  1. Postoperative changes from interval right occipital  craniotomy with resection of right parieto-occipital mass lesions.  2. 1.3 x 0.5 x 0.5 cm area of enhancement at the anterior margin of the more superior resection cavity in the right parieto-occipital region. It is unclear whether this reflects residual tumor or possibly normal enhancement of the choroid plexus in the adjacent compressed occipital horn of the right lateral ventricle. Attention on follow-up recommended.  3. Fairly linear enhancement about the more inferior resection cavity without definite evidence of residual tumor.  4. Small amount of associated postoperative infarct at the medial aspect of the more superior resection site.  5. Stable 6 mm focus of restricted diffusion in the left frontal lobe, compatible with an acute or subacute infarct.  6. Slightly improved vasogenic edema within the right parieto-occipital region with persistent 7 mm of right-to-left midline shift.     Electronically Signed   By: Jeannine Boga M.D.   On: 07/08/2014 23:45   10/14/2014 Mr Brain W ZO Contrast  IMPRESSION: Two discrete foci of enhancement in the RIGHT occipital lobe surround the larger resection cavity, and are suspected to represent foci of residual tumor/GBM displaying interval growth. Recommend continued short-term follow-up..     12/13/2014 Mr Brain W Wo Contrast  IMPRESSION: Increased size/ enhancement of 2 lesions adjacent to the right occipital resection cavity, concerning for growth of residual  tumor. Mildly increased surrounding T2 hyperintensity.  ASSESSMENT & PLAN:  Mr. Tinkham is a 59 year old gentleman with newly diagnosed right parietal GBM, status post subtotal resection. He has recovered very well, except mild headaches and left-sided vision deficits. He has finished concurrent chemoradiation or week ago.  1. GBM, s/p subtotal resection, and concurrent chemoRT completed on 09/15/2014 --He toleratedthree cycle adjuvant Temodar well. -I reviewed his restaging brain MRI, which showed the previous 2 small foci of enhancement around the surgical site has significantly increased on the repeated ischemic. This was reviewed in our brain tumor board this morning, and we agreed this is tumor for progression. Unfortunately, due to the position of this 2 lesions, subtotal resection are unlikely and surgery is not offered -He is only 3 months out of his recent radiation, no role for re-irradiation at this point. -We discussed systemic therapy options. I recommend second line Avastin. He progressed through Temodar, I'll stop Temodar. -We also discussed clinical trial options in another institution, if he would like to pursue. -After lengthy discussion, he agrees to start Avastin. We'll get preauthorization from his insurance company, and a tentatively scheduled the first cycle in 2 weeks. -Chemotherapy consent : Potential side effects of Avastin were discussed with patient in great detail, especially hypertension, proteinuria, hemorrhage, bowel perforation. He voiced good understanding and agrees to proceed -Stop Bactrim.  -cont Nexium daily when he is on steroids, vitamin the vitamin D and calcium, and regular exercise. -Follow up with radiation oncology Dr. Isidore Moos -I discussed this with Dr. Lanell Persons also, she agrees.   2. Anorexia, fatigue, nausea -Continue dexamethasone, we'll gradually wean off after he starts Avastin -Follow up with dietitian. Use Zofran as needed.  Plan: -Starting  Avastin next Friday, May 6 -Lab, and M.D. visit on May 6 before first cycle of Avastin   All questions were answered. The patient knows to call the clinic with any problems, questions or concerns.  I spent 30 minutes counseling the patient face to face. The total time spent in the appointment was 40 minutes and more than 50% was on counseling.     Truitt Merle, MD 12/29/2014 9:56 AM

## 2014-12-30 ENCOUNTER — Encounter: Payer: Self-pay | Admitting: Hematology

## 2014-12-30 ENCOUNTER — Encounter: Payer: Self-pay | Admitting: *Deleted

## 2014-12-30 NOTE — Progress Notes (Signed)
Received email from Abby Potash (Education officer, museum) stating pt is in need of copay assistance. Looking at his account theres no assistance available for him at this time.  He has used up all of his [place] funds.  I reached out to [place], they assist with copays for chemo treatments.  His self-pay balance with physicians are copays or deductibles from office visits.  His only option will be to set up a payment plan with the billing department.  I informed him of this and gave him the billing dept's number as well as the names and numbers of the financial advocates for the radiation dept because he had questions regarding his bills from that dept.  He appreciated the call and the info that was given him.

## 2014-12-30 NOTE — Progress Notes (Signed)
[place] Work  Clinical Social Work was referred by patient for assessment of psychosocial needs.  Clinical Social Worker was contacted by patient due to financial concerns. Pt reports current financial concerns. Pt has had no luck through billing. CSW made referral to fin counselor for additional support. Pt plans to attend Brain Tumor Support Group as well. CSW to continue to follow and assist.   Loren Racer, [place] Worker [place]  [facility] Phone: [phone] Fax: [phone]

## 2015-01-03 ENCOUNTER — Telehealth: Payer: Self-pay | Admitting: *Deleted

## 2015-01-03 NOTE — Telephone Encounter (Signed)
SPOKE [place]. AVASTIN INFUSION HAS BEEN APPROVED FROM 12/29/14-09/02/15. NOTIFIED PT.

## 2015-01-05 ENCOUNTER — Encounter: Payer: Self-pay | Admitting: General Practice

## 2015-01-05 NOTE — Progress Notes (Signed)
Worked with Louie Casa and his wife [name] in brain cancer support group this afternoon, providing pastoral presence, emotional support, and particularly opportunity for them to share and process their story.  Louie Casa uses gratitude and perspective to cope, finding blessings every day and truly taking time to appreciate them.  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  Unexpected, generous gestures of support from friends and even his past clients have boosted their spirits and helped with immediate financial needs, though longer-term concerns still linger.  Wife [name] showed some of her emotional distress through body language.  Both connected well with group facilitators (Chaplain [employer] and me), and they are aware of ongoing chaplain availability for support.  We plan for me to follow up when pt has tx tomorrow, but please also page anytime as needs arise.  Thank you.  [place], [place]

## 2015-01-06 ENCOUNTER — Other Ambulatory Visit (HOSPITAL_BASED_OUTPATIENT_CLINIC_OR_DEPARTMENT_OTHER): Payer: 59

## 2015-01-06 ENCOUNTER — Ambulatory Visit (HOSPITAL_BASED_OUTPATIENT_CLINIC_OR_DEPARTMENT_OTHER): Payer: 59

## 2015-01-06 ENCOUNTER — Other Ambulatory Visit: Payer: Self-pay | Admitting: Hematology

## 2015-01-06 ENCOUNTER — Encounter: Payer: Self-pay | Admitting: General Practice

## 2015-01-06 VITALS — BP 118/60 | HR 82 | Temp 98.1°F | Resp 20

## 2015-01-06 DIAGNOSIS — Z5112 Encounter for antineoplastic immunotherapy: Secondary | ICD-10-CM

## 2015-01-06 DIAGNOSIS — C714 Malignant neoplasm of occipital lobe: Secondary | ICD-10-CM | POA: Diagnosis not present

## 2015-01-06 DIAGNOSIS — C719 Malignant neoplasm of brain, unspecified: Secondary | ICD-10-CM

## 2015-01-06 DIAGNOSIS — C713 Malignant neoplasm of parietal lobe: Secondary | ICD-10-CM

## 2015-01-06 LAB — CBC WITH DIFFERENTIAL/PLATELET
BASO%: 0.1 % (ref 0.0–2.0)
Basophils Absolute: 0 10*3/uL (ref 0.0–0.1)
EOS%: 0.8 % (ref 0.0–7.0)
Eosinophils Absolute: 0.1 10*3/uL (ref 0.0–0.5)
HCT: 39.3 % (ref 38.4–49.9)
HGB: 13 g/dL (ref 13.0–17.1)
LYMPH%: 7.9 % — AB (ref 14.0–49.0)
MCH: 30.2 pg (ref 27.2–33.4)
MCHC: 33.1 g/dL (ref 32.0–36.0)
MCV: 91.4 fL (ref 79.3–98.0)
MONO#: 0.4 10*3/uL (ref 0.1–0.9)
MONO%: 4.1 % (ref 0.0–14.0)
NEUT#: 8.4 10*3/uL — ABNORMAL HIGH (ref 1.5–6.5)
NEUT%: 87.1 % — ABNORMAL HIGH (ref 39.0–75.0)
PLATELETS: 129 10*3/uL — AB (ref 140–400)
RBC: 4.3 10*6/uL (ref 4.20–5.82)
RDW: 17.1 % — AB (ref 11.0–14.6)
WBC: 9.7 10*3/uL (ref 4.0–10.3)
lymph#: 0.8 10*3/uL — ABNORMAL LOW (ref 0.9–3.3)
nRBC: 0 % (ref 0–0)

## 2015-01-06 LAB — UA PROTEIN, DIPSTICK - CHCC: Protein, ur: <30 mg/dL

## 2015-01-06 MED ORDER — SODIUM CHLORIDE 0.9 % IV SOLN
Freq: Once | INTRAVENOUS | Status: AC
  Administered 2015-01-06: 13:00:00 via INTRAVENOUS

## 2015-01-06 MED ORDER — SODIUM CHLORIDE 0.9 % IV SOLN
9.8000 mg/kg | Freq: Once | INTRAVENOUS | Status: AC
  Administered 2015-01-06: 1000 mg via INTRAVENOUS
  Filled 2015-01-06: qty 40

## 2015-01-06 NOTE — Progress Notes (Signed)
Followed up with Louie Casa and [name] briefly in infusion room and then by phone due to interruption.  Louie Casa was in good spirits, pleased that tx felt manageable ("I can do these!") and to receive call for support and encouragement.  Following for support, but please also page as needs arise.  Thank you.  [place], [place]

## 2015-01-06 NOTE — Patient Instructions (Signed)
[place] Discharge Instructions for Patients Receiving Chemotherapy  Today you received the following chemotherapy agent: Avastin   To help prevent nausea and vomiting after your treatment, we encourage you to take your nausea medication as prescribed.  You have prescriptions for Zofran (every 8 hours) and Compazine (every 6 hours) to use as needed for nausea.    If you develop nausea and vomiting that is not controlled by your nausea medication, call the clinic.   BELOW ARE SYMPTOMS THAT SHOULD BE REPORTED IMMEDIATELY:  *FEVER GREATER THAN 100.5 F  *CHILLS WITH OR WITHOUT FEVER  NAUSEA AND VOMITING THAT IS NOT CONTROLLED WITH YOUR NAUSEA MEDICATION  *UNUSUAL SHORTNESS OF BREATH  *UNUSUAL BRUISING OR BLEEDING  TENDERNESS IN MOUTH AND THROAT WITH OR WITHOUT PRESENCE OF ULCERS  *URINARY PROBLEMS  *BOWEL PROBLEMS  UNUSUAL RASH Items with * indicate a potential emergency and should be followed up as soon as possible.  Feel free to call the clinic you have any questions or concerns. The clinic phone number is [phone].  Please show the [place] at check-in to the Emergency Department and triage nurse.  Bevacizumab injection What is this medicine? BEVACIZUMAB (be va SIZ yoo mab) is a chemotherapy drug. It targets a protein found in many cancer cell types, and halts cancer growth. This drug treats many cancers including non-small cell lung cancer, ovarian cancer, cervical cancer, and colon or rectal cancer. It is usually given with other chemotherapy drugs. This medicine may be used for other purposes; ask your health care provider or pharmacist if you have questions. COMMON BRAND NAME(S): Avastin What should I tell my health care provider before I take this medicine? They need to know if you have any of these conditions: -blood clots -heart disease, including heart failure, heart attack, or chest pain (angina) -high blood pressure -infection  (especially a virus infection such as chickenpox, cold sores, or herpes) -kidney disease -lung disease -prior chemotherapy with doxorubicin, daunorubicin, epirubicin, or other anthracycline type chemotherapy agents -recent or ongoing radiation therapy -recent surgery -stroke -an unusual or allergic reaction to bevacizumab, hamster proteins, mouse proteins, other medicines, foods, dyes, or preservatives -pregnant or trying to get pregnant -breast-feeding How should I use this medicine? This medicine is for infusion into a vein. It is given by a health care professional in a hospital or clinic setting. Talk to your pediatrician regarding the use of this medicine in children. Special care may be needed. Overdosage: If you think you have taken too much of this medicine contact a poison control center or emergency room at once. NOTE: This medicine is only for you. Do not share this medicine with others. What if I miss a dose? It is important not to miss your dose. Call your doctor or health care professional if you are unable to keep an appointment. What may interact with this medicine? Interactions are not expected. This list may not describe all possible interactions. Give your health care provider a list of all the medicines, herbs, non-prescription drugs, or dietary supplements you use. Also tell them if you smoke, drink alcohol, or use illegal drugs. Some items may interact with your medicine. What should I watch for while using this medicine? Your condition will be monitored carefully while you are receiving this medicine. You will need important blood work and urine testing done while you are taking this medicine. During your treatment, let your health care professional know if you have any unusual symptoms, such as difficulty  breathing. This medicine may rarely cause 'gastrointestinal perforation' (holes in the stomach, intestines or colon), a serious side effect requiring surgery to  repair. This medicine should be started at least 28 days following major surgery and the site of the surgery should be totally healed. Check with your doctor before scheduling dental work or surgery while you are receiving this treatment. Talk to your doctor if you have recently had surgery or if you have a wound that has not healed. Do not become pregnant while taking this medicine. Women should inform their doctor if they wish to become pregnant or think they might be pregnant. There is a potential for serious side effects to an unborn child. Talk to your health care professional or pharmacist for more information. Do not breast-feed an infant while taking this medicine. This medicine has caused ovarian failure in some women. This medicine may interfere with the ability to have a child. You should talk to your doctor or health care professional if you are concerned about your fertility. What side effects may I notice from receiving this medicine? Side effects that you should report to your doctor or health care professional as soon as possible: -allergic reactions like skin rash, itching or hives, swelling of the face, lips, or tongue -signs of infection - fever or chills, cough, sore throat, pain or trouble passing urine -signs of decreased platelets or bleeding - bruising, pinpoint red spots on the skin, black, tarry stools, nosebleeds, blood in the urine -breathing problems -changes in vision -chest pain -confusion -jaw pain, especially after dental work -mouth sores -seizures -severe abdominal pain -severe headache -sudden numbness or weakness of the face, arm or leg -swelling of legs or ankles -symptoms of a stroke: change in mental awareness, inability to talk or move one side of the body (especially in patients with lung cancer) -trouble passing urine or change in the amount of urine -trouble speaking or understanding -trouble walking, dizziness, loss of balance or coordination Side  effects that usually do not require medical attention (report to your doctor or health care professional if they continue or are bothersome): -constipation -diarrhea -dry skin -headache -loss of appetite -nausea, vomiting This list may not describe all possible side effects. Call your doctor for medical advice about side effects. You may report side effects to FDA at 1-800-FDA-1088. Where should I keep my medicine? This drug is given in a hospital or clinic and will not be stored at home. NOTE: This sheet is a summary. It may not cover all possible information. If you have questions about this medicine, talk to your doctor, pharmacist, or health care provider.  2015, Elsevier/Gold Standard. (2013-07-20 11:38:34)

## 2015-01-10 ENCOUNTER — Telehealth: Payer: Self-pay

## 2015-01-10 NOTE — Telephone Encounter (Signed)
lvm called re chemo f/u: looking at eating and drinking and urinating and BMs. No nausea/vomiting. Call if any problems.

## 2015-01-10 NOTE — Telephone Encounter (Signed)
-----   Message from Alla Feeling, RN sent at 01/06/2015  2:25 PM EDT ----- Regarding: follow up call First time avastin, pt tolerated well. DR. Burr Medico.

## 2015-01-13 ENCOUNTER — Ambulatory Visit: Payer: 59

## 2015-01-13 ENCOUNTER — Other Ambulatory Visit: Payer: 59

## 2015-01-20 ENCOUNTER — Other Ambulatory Visit (HOSPITAL_BASED_OUTPATIENT_CLINIC_OR_DEPARTMENT_OTHER): Payer: 59

## 2015-01-20 ENCOUNTER — Ambulatory Visit (HOSPITAL_BASED_OUTPATIENT_CLINIC_OR_DEPARTMENT_OTHER): Payer: 59 | Admitting: Hematology

## 2015-01-20 ENCOUNTER — Other Ambulatory Visit: Payer: Self-pay | Admitting: Family Medicine

## 2015-01-20 ENCOUNTER — Telehealth: Payer: Self-pay | Admitting: Hematology

## 2015-01-20 ENCOUNTER — Ambulatory Visit (HOSPITAL_BASED_OUTPATIENT_CLINIC_OR_DEPARTMENT_OTHER): Payer: 59

## 2015-01-20 ENCOUNTER — Encounter: Payer: Self-pay | Admitting: Hematology

## 2015-01-20 VITALS — BP 137/80 | HR 75 | Temp 97.7°F | Resp 18 | Ht 71.0 in | Wt 237.0 lb

## 2015-01-20 DIAGNOSIS — C713 Malignant neoplasm of parietal lobe: Secondary | ICD-10-CM

## 2015-01-20 DIAGNOSIS — R630 Anorexia: Secondary | ICD-10-CM

## 2015-01-20 DIAGNOSIS — C719 Malignant neoplasm of brain, unspecified: Secondary | ICD-10-CM

## 2015-01-20 DIAGNOSIS — C714 Malignant neoplasm of occipital lobe: Secondary | ICD-10-CM | POA: Diagnosis not present

## 2015-01-20 DIAGNOSIS — R5383 Other fatigue: Secondary | ICD-10-CM | POA: Diagnosis not present

## 2015-01-20 DIAGNOSIS — Z5112 Encounter for antineoplastic immunotherapy: Secondary | ICD-10-CM | POA: Diagnosis not present

## 2015-01-20 DIAGNOSIS — R110 Nausea: Secondary | ICD-10-CM

## 2015-01-20 LAB — CBC WITH DIFFERENTIAL/PLATELET
BASO%: 0.1 % (ref 0.0–2.0)
Basophils Absolute: 0 10*3/uL (ref 0.0–0.1)
EOS%: 0.7 % (ref 0.0–7.0)
Eosinophils Absolute: 0.1 10*3/uL (ref 0.0–0.5)
HCT: 44.4 % (ref 38.4–49.9)
HGB: 14.7 g/dL (ref 13.0–17.1)
LYMPH#: 1.4 10*3/uL (ref 0.9–3.3)
LYMPH%: 20.5 % (ref 14.0–49.0)
MCH: 30.1 pg (ref 27.2–33.4)
MCHC: 33.1 g/dL (ref 32.0–36.0)
MCV: 91 fL (ref 79.3–98.0)
MONO#: 0.5 10*3/uL (ref 0.1–0.9)
MONO%: 7 % (ref 0.0–14.0)
NEUT#: 5 10*3/uL (ref 1.5–6.5)
NEUT%: 71.7 % (ref 39.0–75.0)
Platelets: 129 10*3/uL — ABNORMAL LOW (ref 140–400)
RBC: 4.88 10*6/uL (ref 4.20–5.82)
RDW: 17.5 % — AB (ref 11.0–14.6)
WBC: 7 10*3/uL (ref 4.0–10.3)

## 2015-01-20 LAB — COMPREHENSIVE METABOLIC PANEL (CC13)
ALT: 34 U/L (ref 0–55)
ANION GAP: 13 meq/L — AB (ref 3–11)
AST: 20 U/L (ref 5–34)
Albumin: 3.4 g/dL — ABNORMAL LOW (ref 3.5–5.0)
Alkaline Phosphatase: 67 U/L (ref 40–150)
BILIRUBIN TOTAL: 0.48 mg/dL (ref 0.20–1.20)
BUN: 23.1 mg/dL (ref 7.0–26.0)
CALCIUM: 9.2 mg/dL (ref 8.4–10.4)
CHLORIDE: 104 meq/L (ref 98–109)
CO2: 22 mEq/L (ref 22–29)
CREATININE: 0.8 mg/dL (ref 0.7–1.3)
EGFR: >90 mL/min/{1.73_m2} (ref >90)
Glucose: 104 mg/dl (ref 70–140)
Potassium: 3.9 mEq/L (ref 3.5–5.1)
Sodium: 139 mEq/L (ref 136–145)
Total Protein: 6.2 g/dL — ABNORMAL LOW (ref 6.4–8.3)

## 2015-01-20 LAB — TECHNOLOGIST REVIEW

## 2015-01-20 MED ORDER — SODIUM CHLORIDE 0.9 % IV SOLN
9.7500 mg/kg | Freq: Once | INTRAVENOUS | Status: AC
  Administered 2015-01-20: 1000 mg via INTRAVENOUS
  Filled 2015-01-20: qty 40

## 2015-01-20 MED ORDER — SODIUM CHLORIDE 0.9 % IV SOLN
Freq: Once | INTRAVENOUS | Status: AC
  Administered 2015-01-20: 14:00:00 via INTRAVENOUS

## 2015-01-20 MED ORDER — DEXAMETHASONE 4 MG PO TABS: ORAL_TABLET | ORAL | Status: DC

## 2015-01-20 NOTE — Progress Notes (Signed)
[place] FOLLOW UP NOTE  Patient Care Team: [name], MD as PCP - General (Family Medicine) Kary Kos, MD as Consulting Physician (Neurosurgery) Eppie Gibson, MD as Attending Physician (Radiation Oncology) Truitt Merle, MD as Consulting Physician (Hematology)     Glioblastoma of occipital lobe   07/02/2014 Imaging Brain MRI: Two adjacent enhancing mass lesions in the right parietal lobe with central necrosis and extensive surrounding edema and mass effect.  7 mm midline shift to the left.  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   CT CAP(-)   07/07/2014 Surgery Subtotal right parietal tumors resection    08/03/2014 Concurrent Chemotherapy Concurrent radiation with Temodar 75 mg/m daily, completed on 09/15/2014.    09/29/2014 - 12/28/2014 Chemotherapy Adjuvant Temodar, 150-200mg /m2 for 5 days, every 28 day cycle, s/p 3 cycles   10/14/2014 Imaging Two discrete foci of enhancement in the RIGHT occipital lobe surround the larger resection cavity, and are suspected to represent foci of residual tumor/GBM displaying interval growt   12/13/2014 Imaging Increased size/ enhancement of 2 lesions adjacent to the right occipital resection cavity, concerning for growth of residual tumor.    GBM (glioblastoma multiforme)   07/22/2014 Initial Diagnosis GBM (glioblastoma multiforme)    CURRENT THERAPY: Bevacizumab 10 mg/kg every 2 weeks, started on 01/06/2059 dexa 4mg  daily   CHIEF COMPLAINTS: follow up GBM   HISTORY OF INITIAL PRESENTATION;   He initially presented was headaches and sinus congestion in mid October 2015. He was seen by his primary care physician and was treated for sinus infection. He subsequently developed left sided visual deficit, left arm and leg weakness and unsteady gait. He was sent to emergency room by his primary care physician on 07/02/2014. CT  And MRI of brain reviewed to add adjacent enhancing mass lesions in the right parietal lobe with  central necrosis and extensive surrounding edema and mass effect. He was admitted and underwent right craniotomy  For resection of the 2 brain masses by Dr. Lonell Grandchild. Postsurgical brain MRI revealed a 1.3 cm area of enhancement at the anterior margin of surgical resection cavity, likely surgical change. His case was reviewed in the tumor board and it was felt he had subtotal resection.  INTERVAL HISTROY: Jorel returns for follow-up. He tolerated avastin infusion well. He has moderate fatigue, mild intermittent headache is stable. He has insomnia, takes ativan 1.5mg  at night. No fever or chills. His appetite is good, he is on dexa 4mg  daily, has been on it for 2 weeks now (tapppering does). He is able to function well at home, remains to be moderately active, does guarding work Social research officer, government.   MEDICAL HISTORY:  Past Medical History  Diagnosis Date  . Thyroid disease   . Hypertension   . Allergy   . Arthritis   .    Marland Kitchen Kidney stone     SURGICAL HISTORY: Past Surgical History  Procedure Laterality Date  . Knee arthroscopy w/ meniscal repair    . Treatment fistula anal    . 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 Marital Status: Married    Spouse Name: N/A    Number of Children: 5 children, age of 82-21  . Years of Education: N/A   Occupational History  . 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    Social History Main Topics  . Smoking status: Current Every Day Smoker -- 1.00 packs/day for 45 years    Types: Cigarettes  . Smokeless tobacco: Never Used  . Alcohol Use: Yes     Comment: 2-3 beers per/day   . Drug Use: No  . Sexual Activity: Not on file     FAMILY HISTORY: Family History  Problem Relation Age of Onset  . Hypertension Mother   . Diabetes Father   . Glaucoma Father    . Diabetes Brother   . Diabetes Brother   . Colon cancer Neg Hx   Paternal ancle had prostate cancer. No other family history of malignancy.  ALLERGIES:  has No Known Allergies.   MEDICATIONS:  Current Outpatient Prescriptions  Medication Sig Dispense Refill  . dexamethasone (DECADRON) 4 MG tablet If nausea persists after resolving constipation, take 4mg  TID for 1 week, then 4mg  BID for 1 week, then 4mg  daily thereafter. (Patient taking differently: Take 2 mg by mouth daily. If nausea persists after resolving constipation, take 4mg  TID for 1 week, then 4mg  BID for 1 week, then 4mg  daily thereafter.) 60 tablet 0  . docusate sodium (COLACE) 100 MG capsule Take 100 mg by mouth 2 (two) times daily.    Marland Kitchen HYDROcodone-acetaminophen (NORCO/VICODIN) 5-325 MG per tablet Take 1 tablet by mouth every 4 (four) hours as needed for moderate pain. 120 tablet 0  . levothyroxine (SYNTHROID, LEVOTHROID) 50 MCG tablet Take 50 mcg by mouth daily before breakfast. TAKE ONE TABLET BY MOUTH EVERY DAY    . lisinopril (PRINIVIL,ZESTRIL) 40 MG tablet Take 40 mg by mouth daily. TAKE ONE TABLET BY MOUTH EVERY DAY    . LORazepam (ATIVAN) 0.5 MG tablet Take 1-2 tablets every 8 hrs prn anxiety or nausea; may take 3 tablets for evening dose. 120 tablet 0  . Multiple Vitamins-Minerals (MULTIVITAMIN WITH MINERALS) tablet Take 1 tablet by mouth daily.    Marland Kitchen NEXIUM 24HR 20 MG capsule   0  . Probiotic Product (PROBIOTIC DAILY PO) Take by mouth daily.     Marland Kitchen senna (SENOKOT) 8.6 MG tablet Take 1 tablet by mouth daily.    . verapamil (COVERA HS) 180 MG (CO) 24 hr tablet Take 180 mg by mouth at bedtime.    . ondansetron (ZOFRAN) 8 MG tablet Take 1 tablet (8 mg total) by mouth every 8 (eight) hours as needed for nausea or vomiting. (Patient not taking: Reported on 12/28/2014) 30 tablet 4  . prochlorperazine (COMPAZINE) 10 MG tablet Take 1 tablet (10 mg total) by mouth every 6 (six) hours as needed for nausea or vomiting. (Patient not  taking: Reported on 12/28/2014) 60 tablet 1   No current facility-administered medications for this visit.    REVIEW OF SYSTEMS:   Constitutional: Denies fevers, chills or abnormal night sweats Eyes: Denies blurriness of vision, double vision or watery eyes Ears, nose, mouth, throat, and face: Denies mucositis or sore throat Respiratory: Denies cough, dyspnea or wheezes Cardiovascular: Denies palpitation, chest discomfort or lower extremity swelling Gastrointestinal:  Denies nausea, heartburn or change in bowel habits Skin: Denies abnormal skin rashes Lymphatics: Denies new lymphadenopathy or easy bruising Neurological:positive for mild headaches, and left side vision deficits. His left-sided weakness has nearly resolved. Denies numbness, tingling or new weaknesses Behavioral/Psych: Mood is stable, no new changes  All other systems were reviewed with the patient and are negative.  PHYSICAL EXAMINATION: ECOG PERFORMANCE STATUS: 1 - Symptomatic but completely ambulatory  KPS: 80%  Filed Vitals:   01/20/15 1207  BP: 137/80  Pulse: 75  Temp: 97.7 F (36.5 C)  Resp: 18   Filed Weights   01/20/15 1207  Weight: 237 lb (107.502 kg)    GENERAL:alert, no distress and comfortable SKIN: skin color, texture, turgor are normal, no rashes or significant lesions HEAD: right-sided posterior craniotomy surgical wound is healing well, no surrounding edema, skin redness or discharge EYES: normal, conjunctiva are pink and non-injected, sclera clear OROPHARYNX:no exudate, no erythema and lips, buccal mucosa, and tongue normal  NECK: supple, thyroid normal size, non-tender, without nodularity LYMPH:  no palpable lymphadenopathy in the cervical, axillary or inguinal LUNGS: clear to auscultation and percussion with normal breathing effort HEART: regular rate & rhythm and no murmurs and no lower extremity edema ABDOMEN:abdomen soft, non-tender and normal bowel sounds Musculoskeletal:no cyanosis of  digits and no clubbing  PSYCH: alert & oriented x 3 with fluent speech NEURO: cranial nerve 2-12 are intact. Left side vision deficits is present in the left upper and mid quadrant. Motor and sensitivity are normal and symmetric on all extremities. Speech is fluent, coordination is grossly intact. Ambulates independently and Lutricia Feil sign negative. EXT: Bilateral pitting edema in ankles, better than before   LABORATORY DATA:  CBC Latest Ref Rng 01/20/2015 01/06/2015 12/28/2014  WBC 4.0 - 10.3 10e3/uL 7.0 9.7 12.5(H)  Hemoglobin 13.0 - 17.1 g/dL 14.7 13.0 13.7  Hematocrit 38.4 - 49.9 % 44.4 39.3 42.0  Platelets 140 - 400 10e3/uL 129(L) 129(L) 150    CMP Latest Ref Rng 01/20/2015 12/21/2014 11/30/2014  Glucose 70 - 140 mg/dl 104 122 124  BUN 7.0 - 26.0 mg/dL 23.1 17.9 11.7  Creatinine 0.7 - 1.3 mg/dL 0.8 0.8 1.0  Sodium 136 - 145 mEq/L 139 136 140  Potassium 3.5 - 5.1 mEq/L 3.9 4.5 4.3  Chloride 96 - 112 mEq/L - - -  CO2 22 - 29 mEq/L 22 17(L) 24  Calcium 8.4 - 10.4 mg/dL 9.2 9.7 9.4  Total Protein 6.4 - 8.3 g/dL 6.2(L) 6.7 6.5  Total Bilirubin 0.20 - 1.20 mg/dL 0.48 0.37 0.50  Alkaline Phos 40 - 150 U/L 67 65 76  AST 5 - 34 U/L 20 22 24   ALT 0 - 55 U/L 34 30 32     Surgical path 07/07/2014  Diagnosis 1. Brain, biopsy - GLIOBLASTOMA MULTIFORME, WHO GRADE IV/IV. - SEE ONCOLOGY TABLE BELOW. 2. Brain, for tumor resection, higher right parietal - GLIOBLASTOMA MULTIFORME, WHO GRADE IV/IV. - SEE ONCOLOGY TABLE BELOW. 3. Brain, for tumor resection, lower right occipital - GLIOBLASTOMA MULTIFORME, WHO GRADE IV/IV   Microscopic Comment 1. -3. ONCOLOGY TABLE - BRAIN AND SPINAL CORD 1. Procedure: Resection x2 2. Tumor site, including laterality: Right parietal and right occipital 3. Maximum tumor size (cm): At least 1.8 cm 4. Histologic type: Glioblastoma multiforme 5. Grade: WHO Grade IV/IV 6. Margins (if applicable): Can not be assessed 7. Ancillary studies: Can be performed upon  clinician request.  RADIOGRAPHIC STUDIES: I have personally reviewed the radiological images as listed and agreed with the findings in the report.  Mr [name] Wo Contrast 07/08/2014    IMPRESSION:  1. Postoperative changes from interval right occipital craniotomy with resection of right parieto-occipital mass lesions.  2. 1.3 x 0.5 x 0.5 cm area of enhancement at the anterior margin of the more superior resection cavity in the right parieto-occipital region. It is unclear whether this reflects residual tumor or possibly normal enhancement of the choroid plexus  in the adjacent compressed occipital horn of the right lateral ventricle. Attention on follow-up recommended.  3. Fairly linear enhancement about the more inferior resection cavity without definite evidence of residual tumor.  4. Small amount of associated postoperative infarct at the medial aspect of the more superior resection site.  5. Stable 6 mm focus of restricted diffusion in the left frontal lobe, compatible with an acute or subacute infarct.  6. Slightly improved vasogenic edema within the right parieto-occipital region with persistent 7 mm of right-to-left midline shift.     Electronically Signed   By: Jeannine Boga M.D.   On: 07/08/2014 23:45   10/14/2014 Mr Brain W OV Contrast  IMPRESSION: Two discrete foci of enhancement in the RIGHT occipital lobe surround the larger resection cavity, and are suspected to represent foci of residual tumor/GBM displaying interval growth. Recommend continued short-term follow-up..     12/13/2014 Mr Brain W Wo Contrast  IMPRESSION: Increased size/ enhancement of 2 lesions adjacent to the right occipital resection cavity, concerning for growth of residual tumor. Mildly increased surrounding T2 hyperintensity.  ASSESSMENT & PLAN:  Mr. Shipper is a 59 year old gentleman with newly diagnosed right parietal GBM, status post subtotal resection. He has recovered very well, except mild  headaches and left-sided vision deficits. He has finished concurrent chemoradiation or week ago.  1. GBM, s/p subtotal resection, and concurrent chemoRT completed on 09/15/2014, progressed on adjuvant Temodar, now on second line Avastin -I reviewed his restaging brain MRI from 4.12.2016, e previous 2 small foci of enhancement around the surgical site has significantly increased on the repeated ischemic. This was reviewed in our brain tumor board this morning, and we agreed this is tumor for progression. Unfortunately, due to the position of this 2 lesions, subtotal resection are unlikely and surgery is not offered -He is only 3 months out of his recent radiation, no role for re-irradiation at this point. -We discussed systemic therapy options. I recommend second line Avastin. He progressed through Temodar, I'll stop Temodar. -We also discussed clinical trial options in another institution, if he would like to pursue. -After lengthy discussion, he agrees to start Avastin. He is tolerating well, we'll continue -We'll continue dexamethasone at 4 mg daily for now, possibly reduce his dose in 2-4 weeks  -cont Nexium daily when he is on steroids, vitamin the vitamin D and calcium, and regular exercise. -Follow up with radiation oncology Dr. Isidore Moos   2. Anorexia, fatigue, nausea -Improved after resume dexamethasone -Continue dexamethasone, we'll gradually wean off after he starts Avastin -Follow up with dietitian. Use Zofran as needed.  Plan: -The second cycle Avast and continue every 3 weeksin today,  -Return to clinic in 2 weeks, possible reduce his dexamethasone dose to 4mg /2mg  alternatively daily if he is doing well    All questions were answered. The patient knows to call the clinic with any problems, questions or concerns.  I spent 20 minutes counseling the patient face to face. The total time spent in the appointment was 25 minutes and more than 50% was on counseling.     Truitt Merle,  MD 01/20/2015 12:35 PM

## 2015-01-20 NOTE — Patient Instructions (Signed)
[facility] Discharge Instructions for Patients Receiving Chemotherapy  Today you received the following chemotherapy agents Avastin To help prevent nausea and vomiting after your treatment, we encourage you to take your nausea medication as prescribed.   If you develop nausea and vomiting that is not controlled by your nausea medication, call the clinic.   BELOW ARE SYMPTOMS THAT SHOULD BE REPORTED IMMEDIATELY:  *FEVER GREATER THAN 100.5 F  *CHILLS WITH OR WITHOUT FEVER  NAUSEA AND VOMITING THAT IS NOT CONTROLLED WITH YOUR NAUSEA MEDICATION  *UNUSUAL SHORTNESS OF BREATH  *UNUSUAL BRUISING OR BLEEDING  TENDERNESS IN MOUTH AND THROAT WITH OR WITHOUT PRESENCE OF ULCERS  *URINARY PROBLEMS  *BOWEL PROBLEMS  UNUSUAL RASH Items with * indicate a potential emergency and should be followed up as soon as possible.  Feel free to call the clinic you have any questions or concerns. The clinic phone number is [phone].  Please show the CHEMO ALERT CARD at check-in to the Emergency Department and triage nurse.   

## 2015-01-20 NOTE — Telephone Encounter (Signed)
called and left a message with 6/3 an 6/17 appointments

## 2015-01-24 ENCOUNTER — Other Ambulatory Visit: Payer: Self-pay

## 2015-01-24 ENCOUNTER — Encounter: Payer: Self-pay | Admitting: *Deleted

## 2015-01-24 NOTE — Progress Notes (Signed)
[place] Work  Clinical Social Work was referred by [place] rounding and met with pt and his wife in the infusion room on 01/20/15 to check in and reassess needs. Pt reports to be doing ok. Wife has been made full time at her job and has full benefits. This has been a huge help for them both due to his cancer medical needs. Co-pays and deductible coverage has been a challenge as this is their third insurance in the last six months. CSW has attempted to find additional assistance resource to assist without success. Pt and wife aware and stated understanding. Both plan to attend next Brain Support Group. No other current needs identified. CSW to follow.      Clinical Social Work interventions:  Re-assess needs Supportive listening  Loren Racer, [place]  [place] Phone: [phone] Fax: [phone]

## 2015-01-25 ENCOUNTER — Other Ambulatory Visit: Payer: Self-pay | Admitting: Family Medicine

## 2015-01-25 ENCOUNTER — Telehealth: Payer: Self-pay | Admitting: *Deleted

## 2015-01-25 MED ORDER — LEVOTHYROXINE SODIUM 50 MCG PO TABS: 50.0000 ug | ORAL_TABLET | Freq: Every day | ORAL | Status: DC

## 2015-01-25 MED ORDER — LISINOPRIL 40 MG PO TABS: 40.0000 mg | ORAL_TABLET | Freq: Every day | ORAL | Status: DC

## 2015-01-25 NOTE — Telephone Encounter (Signed)
Patient made an appt with Dr. Brigitte Pulse on 03/03/2015 @ 4:15 pm.   He will run out of medication in 3 days and would like to have them refilled if possible up to appt date.  Pt will start a new insurance on 03/03/2015.  levothyroxine (SYNTHROID, LEVOTHROID) 50 MCG tablet lisinopril (PRINIVIL,ZESTRIL) 40 MG tablet   [place]  Pt # [phone]

## 2015-01-25 NOTE — Telephone Encounter (Signed)
Refilled until appt. Pt notified.

## 2015-02-02 ENCOUNTER — Encounter: Payer: Self-pay | Admitting: General Practice

## 2015-02-02 NOTE — Progress Notes (Signed)
Spiritual Care Note  Followed up personally with Jake [name] and wife [name] at the close of brain cancer support group this afternoon.  [name] thanked me for the notecard I sent, and they both verbalized appreciation for my support and leadership in group.  Jake [name] continues to maintain a positive attitude.  He indicated concerns about how effective his current tx is because he is having no major side effects.  Couple indicated that group was helpful and meaningful for them.  Served as a witness to their processing, providing pastoral presence, normalization of feelings, affirmation, and encouragement.  Continuing to follow for support, but please also page as needs arise.  Thank you.  [place], [place]

## 2015-02-03 ENCOUNTER — Other Ambulatory Visit: Payer: Self-pay | Admitting: Hematology and Oncology

## 2015-02-03 ENCOUNTER — Ambulatory Visit (HOSPITAL_BASED_OUTPATIENT_CLINIC_OR_DEPARTMENT_OTHER): Payer: 59

## 2015-02-03 ENCOUNTER — Other Ambulatory Visit (HOSPITAL_BASED_OUTPATIENT_CLINIC_OR_DEPARTMENT_OTHER): Payer: 59

## 2015-02-03 ENCOUNTER — Encounter: Payer: Self-pay | Admitting: Physician Assistant

## 2015-02-03 ENCOUNTER — Ambulatory Visit (HOSPITAL_BASED_OUTPATIENT_CLINIC_OR_DEPARTMENT_OTHER): Payer: 59 | Admitting: Physician Assistant

## 2015-02-03 VITALS — BP 131/72 | HR 79 | Temp 97.8°F | Resp 18

## 2015-02-03 VITALS — BP 139/77 | HR 81 | Temp 97.5°F | Resp 18 | Ht 71.0 in | Wt 245.4 lb

## 2015-02-03 DIAGNOSIS — C714 Malignant neoplasm of occipital lobe: Secondary | ICD-10-CM

## 2015-02-03 DIAGNOSIS — R110 Nausea: Secondary | ICD-10-CM

## 2015-02-03 DIAGNOSIS — C713 Malignant neoplasm of parietal lobe: Secondary | ICD-10-CM

## 2015-02-03 DIAGNOSIS — R630 Anorexia: Secondary | ICD-10-CM | POA: Diagnosis not present

## 2015-02-03 DIAGNOSIS — R5383 Other fatigue: Secondary | ICD-10-CM | POA: Diagnosis not present

## 2015-02-03 DIAGNOSIS — R530 Neoplastic (malignant) related fatigue: Secondary | ICD-10-CM

## 2015-02-03 DIAGNOSIS — C719 Malignant neoplasm of brain, unspecified: Secondary | ICD-10-CM

## 2015-02-03 DIAGNOSIS — Z5112 Encounter for antineoplastic immunotherapy: Secondary | ICD-10-CM | POA: Diagnosis not present

## 2015-02-03 LAB — CBC WITH DIFFERENTIAL/PLATELET
BASO%: 0.4 % (ref 0.0–2.0)
Basophils Absolute: 0 10*3/uL (ref 0.0–0.1)
EOS%: 0.4 % (ref 0.0–7.0)
Eosinophils Absolute: 0 10*3/uL (ref 0.0–0.5)
HCT: 41.3 % (ref 38.4–49.9)
HEMOGLOBIN: 14 g/dL (ref 13.0–17.1)
LYMPH%: 16.1 % (ref 14.0–49.0)
MCH: 30.4 pg (ref 27.2–33.4)
MCHC: 33.9 g/dL (ref 32.0–36.0)
MCV: 89.6 fL (ref 79.3–98.0)
MONO#: 0.5 10*3/uL (ref 0.1–0.9)
MONO%: 5.3 % (ref 0.0–14.0)
NEUT%: 77.8 % — ABNORMAL HIGH (ref 39.0–75.0)
NEUTROS ABS: 7.5 10*3/uL — AB (ref 1.5–6.5)
NRBC: 0 % (ref 0–0)
PLATELETS: 136 10*3/uL — AB (ref 140–400)
RBC: 4.61 10*6/uL (ref 4.20–5.82)
RDW: 17.8 % — ABNORMAL HIGH (ref 11.0–14.6)
WBC: 9.7 10*3/uL (ref 4.0–10.3)
lymph#: 1.6 10*3/uL (ref 0.9–3.3)

## 2015-02-03 LAB — TECHNOLOGIST REVIEW

## 2015-02-03 MED ORDER — BEVACIZUMAB CHEMO INJECTION 400 MG/16ML
9.8000 mg/kg | Freq: Once | INTRAVENOUS | Status: AC
  Administered 2015-02-03: 1000 mg via INTRAVENOUS
  Filled 2015-02-03: qty 40

## 2015-02-03 MED ORDER — SODIUM CHLORIDE 0.9 % IV SOLN
250.0000 mL | Freq: Once | INTRAVENOUS | Status: AC
Start: 2015-02-03 — End: 2015-02-03
  Administered 2015-02-03: 250 mL via INTRAVENOUS

## 2015-02-03 NOTE — Progress Notes (Signed)
[facility] FOLLOW UP NOTE  Patient Care Team: [name], MD as PCP - General (Family Medicine) Kary Kos, MD as Consulting Physician (Neurosurgery) Eppie Gibson, MD as Attending Physician (Radiation Oncology) Truitt Merle, MD as Consulting Physician (Hematology)     Glioblastoma of occipital lobe   07/02/2014 Imaging Brain MRI: Two adjacent enhancing mass lesions in the right parietal lobe with central necrosis and extensive surrounding edema and mass effect.  7 mm midline shift to the left.  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   CT CAP(-)   07/07/2014 Surgery Subtotal right parietal tumors resection    08/03/2014 Concurrent Chemotherapy Concurrent radiation with Temodar 75 mg/m daily, completed on 09/15/2014.    09/29/2014 - 12/28/2014 Chemotherapy Adjuvant Temodar, 150-200mg /m2 for 5 days, every 28 day cycle, s/p 3 cycles, stopped due to disease progression.   10/14/2014 Imaging Two discrete foci of enhancement in the RIGHT occipital lobe surround the larger resection cavity, and are suspected to represent foci of residual tumor/GBM displaying interval growt   12/13/2014 Imaging Increased size/ enhancement of 2 lesions adjacent to the right occipital resection cavity, concerning for growth of residual tumor.   01/06/2015 -  Chemotherapy Bevacizumab 10mg /kg every 2 weeks    GBM (glioblastoma multiforme)   07/22/2014 Initial Diagnosis GBM (glioblastoma multiforme)    CURRENT THERAPY: Bevacizumab 10 mg/kg every 2 weeks, started on 01/06/2059 dexa 4mg  daily   CHIEF COMPLAINTS: follow up GBM   HISTORY OF INITIAL PRESENTATION;   He initially presented was headaches and sinus congestion in mid October 2015. He was seen by his primary care physician and was treated for sinus infection. He subsequently developed left sided visual deficit, left arm and leg weakness and unsteady gait. He was sent to emergency room by his primary care physician on 07/02/2014. CT   And MRI of brain reviewed to add adjacent enhancing mass lesions in the right parietal lobe with central necrosis and extensive surrounding edema and mass effect. He was admitted and underwent right craniotomy  For resection of the 2 brain masses by Dr. Lonell Grandchild. Postsurgical brain MRI revealed a 1.3 cm area of enhancement at the anterior margin of surgical resection cavity, likely surgical change. His case was reviewed in the tumor board and it was felt he had subtotal resection.  INTERVAL HISTROY: Jake [name] returns for follow-up. He continues to tolerate the avastin infusions well. He has moderate fatigue, mild intermittent headache is stable. He has insomnia, takes ativan 1.5mg  at night. No fever or chills. His appetite is good, he is on dexa 4mg  daily, he continues on his taper as instructed. He notes some supersensitivity in his fingertips as well as the sole of his right foot. The symptoms were fleeting and have resolved. He is able to function well at home, remains to be moderately active. He denied any issues with nausea, vomiting, diarrhea or constipation. He's had no bleeding. He denies any vision changes.    MEDICAL HISTORY:  Past Medical History  Diagnosis Date  . Thyroid disease   . Hypertension   . Allergy   . Arthritis   .    Marland Kitchen Kidney stone     SURGICAL HISTORY: Past Surgical History  Procedure Laterality Date  . Knee arthroscopy w/ meniscal repair    . Treatment fistula anal    . 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 Marital Status: Married    Spouse Name: N/A    Number of Children: 5 children, age of 31-21  . Years of Education: N/A   Occupational History  . 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    Social History Main Topics  . Smoking status: Current Every  Day Smoker -- 1.00 packs/day for 45 years    Types: Cigarettes  . Smokeless tobacco: Never Used  . Alcohol Use: Yes     Comment: 2-3 beers per/day   . Drug Use: No  . Sexual Activity: Not on file     FAMILY HISTORY: Family History  Problem Relation Age of Onset  . Hypertension Mother   . Diabetes Father   . Glaucoma Father   . Diabetes Brother   . Diabetes Brother   . Colon cancer Neg Hx   Paternal ancle had prostate cancer. No other family history of malignancy.  ALLERGIES:  has No Known Allergies.   MEDICATIONS:  Current Outpatient Prescriptions  Medication Sig Dispense Refill  . dexamethasone (DECADRON) 4 MG tablet 1 tab daily 30 tablet 1  . docusate sodium (COLACE) 100 MG capsule Take 100 mg by mouth 2 (two) times daily.    Marland Kitchen HYDROcodone-acetaminophen (NORCO/VICODIN) 5-325 MG per tablet Take 1 tablet by mouth every 4 (four) hours as needed for moderate pain. 120 tablet 0  . levothyroxine (SYNTHROID, LEVOTHROID) 50 MCG tablet Take 1 tablet (50 mcg total) by mouth daily before breakfast. TAKE ONE TABLET BY MOUTH EVERY DAY 30 tablet 1  . lisinopril (PRINIVIL,ZESTRIL) 40 MG tablet Take 1 tablet (40 mg total) by mouth daily. TAKE ONE TABLET BY MOUTH EVERY DAY 30 tablet 1  . LORazepam (ATIVAN) 0.5 MG tablet Take 1-2 tablets every 8 hrs prn anxiety or nausea; may take 3 tablets for evening dose. 120 tablet 0  . Multiple Vitamins-Minerals (MULTIVITAMIN WITH MINERALS) tablet Take 1 tablet by mouth daily.    Marland Kitchen NEXIUM 24HR 20 MG capsule   0  . Probiotic Product (PROBIOTIC DAILY PO) Take by mouth daily.     Marland Kitchen senna (SENOKOT) 8.6 MG tablet Take 1 tablet by mouth daily.    . verapamil (COVERA HS) 180 MG (CO) 24 hr tablet Take 180 mg by mouth at bedtime.    . ondansetron (ZOFRAN) 8 MG tablet Take 1 tablet (8 mg total) by mouth every 8 (eight) hours as needed for nausea or vomiting. (Patient not taking: Reported on 02/03/2015) 30 tablet 4  . prochlorperazine (COMPAZINE) 10 MG tablet Take 1  tablet (10 mg total) by mouth every 6 (six) hours as needed for nausea or vomiting. (Patient not taking: Reported on 02/03/2015) 60 tablet 1   No current facility-administered medications for this visit.    REVIEW OF SYSTEMS:   Constitutional: Denies fevers, chills or abnormal night sweats Eyes: Denies blurriness of vision, double vision or watery eyes Ears, nose, mouth, throat, and face: Denies mucositis or sore throat Respiratory: Denies cough, dyspnea or wheezes Cardiovascular: Denies palpitation, chest discomfort or lower extremity swelling Gastrointestinal:  Denies nausea, heartburn or change in bowel habits Skin: Denies abnormal skin rashes Lymphatics: Denies new lymphadenopathy or easy bruising Neurological:positive for mild headaches, and left side vision deficits. His left-sided weakness has nearly resolved. Denies numbness, tingling or new weaknesses Behavioral/Psych: Mood is stable, no new changes  All other systems were reviewed with the patient and are negative.  PHYSICAL EXAMINATION: ECOG PERFORMANCE STATUS: 1 - Symptomatic but completely ambulatory  KPS: 80%  Filed Vitals:   02/03/15 1207  BP: 139/77  Pulse: 81  Temp: 97.5 F (36.4 C)  Resp: 18   Filed Weights   02/03/15 1207  Weight: 245 lb 6.4 oz (111.313 kg)    GENERAL:alert, no distress and comfortable He has a cushingoid appearance. SKIN: skin color, texture, turgor are normal, no rashes or significant lesions HEAD: right-sided posterior craniotomy surgical wound is healing well, no surrounding edema, skin redness or discharge EYES: normal, conjunctiva are pink and non-injected, sclera clear OROPHARYNX:no exudate, no erythema and lips, buccal mucosa, and tongue normal  NECK: supple, thyroid normal size, non-tender, without nodularity LYMPH:  no palpable lymphadenopathy in the cervical, axillary or inguinal LUNGS: clear to auscultation and percussion with normal breathing effort HEART: regular rate &  rhythm and no murmurs and no lower extremity edema ABDOMEN:abdomen soft, non-tender and normal bowel sounds Musculoskeletal:no cyanosis of digits and no clubbing  PSYCH: alert & oriented x 3 with fluent speech NEURO: cranial nerve 2-12 are intact. Left side vision deficits is present in the left upper and mid quadrant. Motor and sensitivity are normal and symmetric on all extremities. Speech is fluent, coordination is grossly intact. Ambulates independently and Jake [name] sign negative. EXT: Bilateral pitting edema in ankles, better than before   LABORATORY DATA:  CBC Latest Ref Rng 02/03/2015 01/20/2015 01/06/2015  WBC 4.0 - 10.3 10e3/uL 9.7 7.0 9.7  Hemoglobin 13.0 - 17.1 g/dL 14.0 14.7 13.0  Hematocrit 38.4 - 49.9 % 41.3 44.4 39.3  Platelets 140 - 400 10e3/uL 136(L) 129(L) 129(L)    CMP Latest Ref Rng 01/20/2015 12/21/2014 11/30/2014  Glucose 70 - 140 mg/dl 104 122 124  BUN 7.0 - 26.0 mg/dL 23.1 17.9 11.7  Creatinine 0.7 - 1.3 mg/dL 0.8 0.8 1.0  Sodium 136 - 145 mEq/L 139 136 140  Potassium 3.5 - 5.1 mEq/L 3.9 4.5 4.3  Chloride 96 - 112 mEq/L - - -  CO2 22 - 29 mEq/L 22 17(L) 24  Calcium 8.4 - 10.4 mg/dL 9.2 9.7 9.4  Total Protein 6.4 - 8.3 g/dL 6.2(L) 6.7 6.5  Total Bilirubin 0.20 - 1.20 mg/dL 0.48 0.37 0.50  Alkaline Phos 40 - 150 U/L 67 65 76  AST 5 - 34 U/L 20 22 24   ALT 0 - 55 U/L 34 30 32     Surgical path 07/07/2014  Diagnosis 1. Brain, biopsy - GLIOBLASTOMA MULTIFORME, WHO GRADE IV/IV. - SEE ONCOLOGY TABLE BELOW. 2. Brain, for tumor resection, higher right parietal - GLIOBLASTOMA MULTIFORME, WHO GRADE IV/IV. - SEE ONCOLOGY TABLE BELOW. 3. Brain, for tumor resection, lower right occipital - GLIOBLASTOMA MULTIFORME, WHO GRADE IV/IV   Microscopic Comment 1. -3. ONCOLOGY TABLE - BRAIN AND SPINAL CORD 1. Procedure: Resection x2 2. Tumor site, including laterality: Right parietal and right occipital 3. Maximum tumor size (cm): At least 1.8 cm 4. Histologic type:  Glioblastoma multiforme 5. Grade: WHO Grade IV/IV 6. Margins (if applicable): Can not be assessed 7. Ancillary studies: Can be performed upon clinician request.  RADIOGRAPHIC STUDIES: I have personally reviewed the radiological images as listed and agreed with the findings in the report.  Jake [name] Wo Contrast 07/08/2014    IMPRESSION:  1. Postoperative changes from interval right occipital craniotomy with resection of right parieto-occipital mass lesions.  2. 1.3 x 0.5 x 0.5 cm area of enhancement at the anterior margin of the more superior resection cavity in the  right parieto-occipital region. It is unclear whether this reflects residual tumor or possibly normal enhancement of the choroid plexus in the adjacent compressed occipital horn of the right lateral ventricle. Attention on follow-up recommended.  3. Fairly linear enhancement about the more inferior resection cavity without definite evidence of residual tumor.  4. Small amount of associated postoperative infarct at the medial aspect of the more superior resection site.  5. Stable 6 mm focus of restricted diffusion in the left frontal lobe, compatible with an acute or subacute infarct.  6. Slightly improved vasogenic edema within the right parieto-occipital region with persistent 7 mm of right-to-left midline shift.     Electronically Signed   By: Jeannine Boga M.D.   On: 07/08/2014 23:45   10/14/2014 Jake Brain W QQ Contrast  IMPRESSION: Two discrete foci of enhancement in the RIGHT occipital lobe surround the larger resection cavity, and are suspected to represent foci of residual tumor/GBM displaying interval growth. Recommend continued short-term follow-up..     12/13/2014 Jake Brain W Wo Contrast  IMPRESSION: Increased size/ enhancement of 2 lesions adjacent to the right occipital resection cavity, concerning for growth of residual tumor. Mildly increased surrounding T2 hyperintensity.  ASSESSMENT & PLAN:  Jake [name]  is a 59 year old gentleman with newly diagnosed right parietal GBM, status post subtotal resection. He has recovered very well, except mild headaches and left-sided vision deficits. He has finished concurrent chemoradiation or week ago.  1. GBM, s/p subtotal resection, and concurrent chemoRT completed on 09/15/2014, progressed on adjuvant Temodar, now on second line Avastin -I reviewed his restaging brain MRI from 4.12.2016, e previous 2 small foci of enhancement around the surgical site has significantly increased on the repeated ischemic. This was reviewed in our brain tumor board this morning, and we agreed this is tumor for progression. Unfortunately, due to the position of this 2 lesions, subtotal resection are unlikely and surgery is not offered -He is only 3 months out of his recent radiation, no role for re-irradiation at this point. -Dr Burr Medico discussed systemic therapy options and recommend second line Avastin. [facility] progressed [facility] this was discontinued.  - Clinical trial options in another institution were discussed, if he would like to pursue. -He agreed to start Avastin. He is tolerating well, we'll continue -We'll continue dexamethasone at 4 mg daily for now, possibly reduce his dose in 2-4 weeks  -cont Nexium daily when he is on steroids, vitamin the vitamin D and calcium, and regular exercise.    2. Anorexia, fatigue, nausea -Improved after resume dexamethasone -Continue dexamethasone, we'll gradually wean off after he starts Avastin -Follow up with dietitian. Use Zofran as needed.  Plan: -Proceed with cycle #3 today and continue every 3 weeks   - reduce his dexamethasone dose to 4mg /2mg  alternatively daily  -Return to clinic in 2 weeks,    All questions were answered. The patient knows to call the clinic with any problems, questions or concerns.  I spent 20 minutes counseling the patient face to face. The total time spent in the appointment was 25 minutes and more  than 50% was on counseling.     Carlton Adam, PA-C 02/03/2015 2:55 PM

## 2015-02-03 NOTE — Patient Instructions (Signed)
[facility] Discharge Instructions for Patients Receiving Chemotherapy  Today you received the following chemotherapy agent: Avastin   To help prevent nausea and vomiting after your treatment, we encourage you to take your nausea medication as prescribed.  You have prescriptions for Zofran (every 8 hours) and Compazine (every 6 hours) to use as needed for nausea.    If you develop nausea and vomiting that is not controlled by your nausea medication, call the clinic.   BELOW ARE SYMPTOMS THAT SHOULD BE REPORTED IMMEDIATELY:  *FEVER GREATER THAN 100.5 F  *CHILLS WITH OR WITHOUT FEVER  NAUSEA AND VOMITING THAT IS NOT CONTROLLED WITH YOUR NAUSEA MEDICATION  *UNUSUAL SHORTNESS OF BREATH  *UNUSUAL BRUISING OR BLEEDING  TENDERNESS IN MOUTH AND THROAT WITH OR WITHOUT PRESENCE OF ULCERS  *URINARY PROBLEMS  *BOWEL PROBLEMS  UNUSUAL RASH Items with * indicate a potential emergency and should be followed up as soon as possible.  Feel free to call the clinic you have any questions or concerns. The clinic phone number is [phone].  Please show the [place] at check-in to the Emergency Department and triage nurse.  Bevacizumab injection What is this medicine? BEVACIZUMAB (be va SIZ yoo mab) is a chemotherapy drug. It targets a protein found in many cancer cell types, and halts cancer growth. This drug treats many cancers including non-small cell lung cancer, ovarian cancer, cervical cancer, and colon or rectal cancer. It is usually given with other chemotherapy drugs. This medicine may be used for other purposes; ask your health care provider or pharmacist if you have questions. COMMON BRAND NAME(S): Avastin What should I tell my health care provider before I take this medicine? They need to know if you have any of these conditions: -blood clots -heart disease, including heart failure, heart attack, or chest pain (angina) -high blood pressure -infection  (especially a virus infection such as chickenpox, cold sores, or herpes) -kidney disease -lung disease -prior chemotherapy with doxorubicin, daunorubicin, epirubicin, or other anthracycline type chemotherapy agents -recent or ongoing radiation therapy -recent surgery -stroke -an unusual or allergic reaction to bevacizumab, hamster proteins, mouse proteins, other medicines, foods, dyes, or preservatives -pregnant or trying to get pregnant -breast-feeding How should I use this medicine? This medicine is for infusion into a vein. It is given by a health care professional in a hospital or clinic setting. Talk to your pediatrician regarding the use of this medicine in children. Special care may be needed. Overdosage: If you think you have taken too much of this medicine contact a poison control center or emergency room at once. NOTE: This medicine is only for you. Do not share this medicine with others. What if I miss a dose? It is important not to miss your dose. Call your doctor or health care professional if you are unable to keep an appointment. What may interact with this medicine? Interactions are not expected. This list may not describe all possible interactions. Give your health care provider a list of all the medicines, herbs, non-prescription drugs, or dietary supplements you use. Also tell them if you smoke, drink alcohol, or use illegal drugs. Some items may interact with your medicine. What should I watch for while using this medicine? Your condition will be monitored carefully while you are receiving this medicine. You will need important blood work and urine testing done while you are taking this medicine. During your treatment, let your health care professional know if you have any unusual symptoms, such as difficulty  breathing. This medicine may rarely cause 'gastrointestinal perforation' (holes in the stomach, intestines or colon), a serious side effect requiring surgery to  repair. This medicine should be started at least 28 days following major surgery and the site of the surgery should be totally healed. Check with your doctor before scheduling dental work or surgery while you are receiving this treatment. Talk to your doctor if you have recently had surgery or if you have a wound that has not healed. Do not become pregnant while taking this medicine. Women should inform their doctor if they wish to become pregnant or think they might be pregnant. There is a potential for serious side effects to an unborn child. Talk to your health care professional or pharmacist for more information. Do not breast-feed an infant while taking this medicine. This medicine has caused ovarian failure in some women. This medicine may interfere with the ability to have a child. You should talk to your doctor or health care professional if you are concerned about your fertility. What side effects may I notice from receiving this medicine? Side effects that you should report to your doctor or health care professional as soon as possible: -allergic reactions like skin rash, itching or hives, swelling of the face, lips, or tongue -signs of infection - fever or chills, cough, sore throat, pain or trouble passing urine -signs of decreased platelets or bleeding - bruising, pinpoint red spots on the skin, black, tarry stools, nosebleeds, blood in the urine -breathing problems -changes in vision -chest pain -confusion -jaw pain, especially after dental work -mouth sores -seizures -severe abdominal pain -severe headache -sudden numbness or weakness of the face, arm or leg -swelling of legs or ankles -symptoms of a stroke: change in mental awareness, inability to talk or move one side of the body (especially in patients with lung cancer) -trouble passing urine or change in the amount of urine -trouble speaking or understanding -trouble walking, dizziness, loss of balance or coordination Side  effects that usually do not require medical attention (report to your doctor or health care professional if they continue or are bothersome): -constipation -diarrhea -dry skin -headache -loss of appetite -nausea, vomiting This list may not describe all possible side effects. Call your doctor for medical advice about side effects. You may report side effects to FDA at 1-800-FDA-1088. Where should I keep my medicine? This drug is given in a hospital or clinic and will not be stored at home. NOTE: This sheet is a summary. It may not cover all possible information. If you have questions about this medicine, talk to your doctor, pharmacist, or health care provider.  2015, [employer]vier/[employer] Standard. (2013-07-20 11:38:34)

## 2015-02-04 NOTE — Patient Instructions (Signed)
Decrease your dexamethasone to 4 mg  alternating with 2 mg daily Follow-up in 2 weeks

## 2015-02-08 ENCOUNTER — Encounter: Payer: Self-pay | Admitting: Physician Assistant

## 2015-02-08 NOTE — Progress Notes (Signed)
Telephone patient and instructed him to decrease his dexamethasone as follows: 4 mg by mouth alternating with 2 mg by mouth on alternate days. Patient is feeling fine and voiced understanding. He will continue on this dosing until instructed otherwise.  Carlton Adam, PA-C 02/08/2015

## 2015-02-09 ENCOUNTER — Other Ambulatory Visit: Payer: Self-pay | Admitting: Radiation Therapy

## 2015-02-09 DIAGNOSIS — C714 Malignant neoplasm of occipital lobe: Secondary | ICD-10-CM

## 2015-02-17 ENCOUNTER — Telehealth: Payer: Self-pay | Admitting: Hematology

## 2015-02-17 ENCOUNTER — Ambulatory Visit (HOSPITAL_BASED_OUTPATIENT_CLINIC_OR_DEPARTMENT_OTHER): Payer: 59

## 2015-02-17 ENCOUNTER — Other Ambulatory Visit: Payer: Self-pay | Admitting: *Deleted

## 2015-02-17 ENCOUNTER — Encounter: Payer: Self-pay | Admitting: Hematology

## 2015-02-17 ENCOUNTER — Ambulatory Visit (HOSPITAL_BASED_OUTPATIENT_CLINIC_OR_DEPARTMENT_OTHER): Payer: 59 | Admitting: Hematology

## 2015-02-17 ENCOUNTER — Other Ambulatory Visit (HOSPITAL_BASED_OUTPATIENT_CLINIC_OR_DEPARTMENT_OTHER): Payer: 59

## 2015-02-17 VITALS — BP 145/85 | HR 78 | Temp 97.8°F | Resp 20 | Ht 71.0 in | Wt 249.8 lb

## 2015-02-17 DIAGNOSIS — C714 Malignant neoplasm of occipital lobe: Secondary | ICD-10-CM

## 2015-02-17 DIAGNOSIS — C713 Malignant neoplasm of parietal lobe: Secondary | ICD-10-CM

## 2015-02-17 DIAGNOSIS — R630 Anorexia: Secondary | ICD-10-CM | POA: Diagnosis not present

## 2015-02-17 DIAGNOSIS — C719 Malignant neoplasm of brain, unspecified: Secondary | ICD-10-CM

## 2015-02-17 DIAGNOSIS — Z5112 Encounter for antineoplastic immunotherapy: Secondary | ICD-10-CM | POA: Diagnosis not present

## 2015-02-17 DIAGNOSIS — R110 Nausea: Secondary | ICD-10-CM

## 2015-02-17 DIAGNOSIS — R5383 Other fatigue: Secondary | ICD-10-CM | POA: Diagnosis not present

## 2015-02-17 LAB — CBC WITH DIFFERENTIAL/PLATELET
BASO%: 0.7 % (ref 0.0–2.0)
BASOS ABS: 0.1 10*3/uL (ref 0.0–0.1)
EOS%: 0.4 % (ref 0.0–7.0)
Eosinophils Absolute: 0 10*3/uL (ref 0.0–0.5)
HEMATOCRIT: 42.4 % (ref 38.4–49.9)
HEMOGLOBIN: 14.1 g/dL (ref 13.0–17.1)
LYMPH%: 19.5 % (ref 14.0–49.0)
MCH: 29.5 pg (ref 27.2–33.4)
MCHC: 33.3 g/dL (ref 32.0–36.0)
MCV: 88.4 fL (ref 79.3–98.0)
MONO#: 0.5 10*3/uL (ref 0.1–0.9)
MONO%: 5.6 % (ref 0.0–14.0)
NEUT%: 73.8 % (ref 39.0–75.0)
NEUTROS ABS: 7.1 10*3/uL — AB (ref 1.5–6.5)
PLATELETS: 150 10*3/uL (ref 140–400)
RBC: 4.8 10*6/uL (ref 4.20–5.82)
RDW: 18.9 % — AB (ref 11.0–14.6)
WBC: 9.6 10*3/uL (ref 4.0–10.3)
lymph#: 1.9 10*3/uL (ref 0.9–3.3)

## 2015-02-17 LAB — COMPREHENSIVE METABOLIC PANEL (CC13)
ALT: 32 U/L (ref 0–55)
AST: 22 U/L (ref 5–34)
Albumin: 3.4 g/dL — ABNORMAL LOW (ref 3.5–5.0)
Alkaline Phosphatase: 57 U/L (ref 40–150)
Anion Gap: 8 mEq/L (ref 3–11)
BILIRUBIN TOTAL: 0.57 mg/dL (ref 0.20–1.20)
BUN: 22.7 mg/dL (ref 7.0–26.0)
CALCIUM: 9.5 mg/dL (ref 8.4–10.4)
CO2: 25 mEq/L (ref 22–29)
CREATININE: 0.8 mg/dL (ref 0.7–1.3)
Chloride: 106 mEq/L (ref 98–109)
EGFR: >90 mL/min/{1.73_m2} (ref >90)
Glucose: 98 mg/dl (ref 70–140)
POTASSIUM: 4.1 meq/L (ref 3.5–5.1)
SODIUM: 138 meq/L (ref 136–145)
Total Protein: 5.9 g/dL — ABNORMAL LOW (ref 6.4–8.3)

## 2015-02-17 LAB — UA PROTEIN, DIPSTICK - CHCC: Protein, ur: NEGATIVE mg/dL

## 2015-02-17 LAB — TECHNOLOGIST REVIEW

## 2015-02-17 MED ORDER — SODIUM CHLORIDE 0.9 % IV SOLN
1100.0000 mg | Freq: Once | INTRAVENOUS | Status: AC
  Administered 2015-02-17: 1100 mg via INTRAVENOUS
  Filled 2015-02-17: qty 44

## 2015-02-17 MED ORDER — SODIUM CHLORIDE 0.9 % IV SOLN
Freq: Once | INTRAVENOUS | Status: AC
Start: 2015-02-17 — End: 2015-02-17
  Administered 2015-02-17: 14:00:00 via INTRAVENOUS

## 2015-02-17 MED ORDER — LORAZEPAM 0.5 MG PO TABS: ORAL_TABLET | ORAL | Status: DC

## 2015-02-17 NOTE — Telephone Encounter (Signed)
lvm for pt regarding to July appts.Marland KitchenMarland KitchenMarland KitchenMarland Kitchenpt will get sched from chemo room

## 2015-02-17 NOTE — Progress Notes (Signed)
[facility] FOLLOW UP NOTE  Patient Care Team: [name], MD as PCP - General (Family Medicine) Kary Kos, MD as Consulting Physician (Neurosurgery) Eppie Gibson, MD as Attending Physician (Radiation Oncology) Truitt Merle, MD as Consulting Physician (Hematology)     Glioblastoma of occipital lobe   07/02/2014 Imaging Brain MRI: Two adjacent enhancing mass lesions in the right parietal lobe with central necrosis and extensive surrounding edema and mass effect.  7 mm midline shift to the left.  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   CT CAP(-)   07/07/2014 Surgery Subtotal right parietal tumors resection    08/03/2014 Concurrent Chemotherapy Concurrent radiation with Temodar 75 mg/m daily, completed on 09/15/2014.    09/29/2014 - 12/28/2014 Chemotherapy Adjuvant Temodar, 150-200mg /m2 for 5 days, every 28 day cycle, s/p 3 cycles, stopped due to disease progression.   10/14/2014 Imaging Two discrete foci of enhancement in the RIGHT occipital lobe surround the larger resection cavity, and are suspected to represent foci of residual tumor/GBM displaying interval growt   12/13/2014 Imaging Increased size/ enhancement of 2 lesions adjacent to the right occipital resection cavity, concerning for growth of residual tumor.   01/06/2015 -  Chemotherapy Bevacizumab 10mg /kg every 2 weeks    GBM (glioblastoma multiforme)   07/22/2014 Initial Diagnosis GBM (glioblastoma multiforme)    CURRENT THERAPY: Bevacizumab 10 mg/kg every 2 weeks, started on 01/06/2059 dexa 4mg  daily until 6/10, changed to 4mg  and 2mg  daily alternatively,  CHIEF COMPLAINTS: follow up GBM   HISTORY OF INITIAL PRESENTATION;   He initially presented was headaches and sinus congestion in mid October 2015. He was seen by his primary care physician and was treated for sinus infection. He subsequently developed left sided visual deficit, left arm and leg weakness and unsteady gait. He was sent to emergency  room by his primary care physician on 07/02/2014. CT  And MRI of brain reviewed to add adjacent enhancing mass lesions in the right parietal lobe with central necrosis and extensive surrounding edema and mass effect. He was admitted and underwent right craniotomy  For resection of the 2 brain masses by Dr. Lonell Grandchild. Postsurgical brain MRI revealed a 1.3 cm area of enhancement at the anterior margin of surgical resection cavity, likely surgical change. His case was reviewed in the tumor board and it was felt he had subtotal resection.  INTERVAL HISTROY: Orvin returns for follow-up. He tolerated avastin infusion well. He is on dexa 4/2mg  daily alternatively,  Started 1 week ago. He didn't have 2 episodes of moderate to have severe headache when he was on 2 mg. He complains about some skin peeling at both palm, no significant neuropathy, moderate fatigue, leg weakness, he pushes himself to walk more, but needs to sit down due to fatigue.  Currently patient slightly worse, no double vision, or other new neuro symptoms.  He gained about 10 pounds in the past months.   MEDICAL HISTORY:  Past Medical History  Diagnosis Date  . Thyroid disease   . Hypertension   . Allergy   . Arthritis   .    Marland Kitchen Kidney stone     SURGICAL HISTORY: Past Surgical History  Procedure Laterality Date  . Knee arthroscopy [name]/ meniscal repair    . Treatment fistula anal    . 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 Marital Status: Married    Spouse Name: N/A    Number of Children: 5 children, age of 45-21  . Years of Education: N/A   Occupational History  . 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    Social History Main Topics  . Smoking status: Current Every Day Smoker -- 1.00 packs/day for 45 years    Types:  Cigarettes  . Smokeless tobacco: Never Used  . Alcohol Use: Yes     Comment: 2-3 beers per/day   . Drug Use: No  . Sexual Activity: Not on file     FAMILY HISTORY: Family History  Problem Relation Age of Onset  . Hypertension Mother   . Diabetes Father   . Glaucoma Father   . Diabetes Brother   . Diabetes Brother   . Colon cancer Neg Hx   Paternal ancle had prostate cancer. No other family history of malignancy.  ALLERGIES:  has No Known Allergies.   MEDICATIONS:  Current Outpatient Prescriptions  Medication Sig Dispense Refill  . dexamethasone (DECADRON) 4 MG tablet 1 tab daily (Patient taking differently: Take 2mg  alternate with 4mg  daily.) 30 tablet 1  . docusate sodium (COLACE) 100 MG capsule Take 100 mg by mouth 2 (two) times daily.    Marland Kitchen levothyroxine (SYNTHROID, LEVOTHROID) 50 MCG tablet Take 1 tablet (50 mcg total) by mouth daily before breakfast. TAKE ONE TABLET BY MOUTH EVERY DAY 30 tablet 1  . lisinopril (PRINIVIL,ZESTRIL) 40 MG tablet Take 1 tablet (40 mg total) by mouth daily. TAKE ONE TABLET BY MOUTH EVERY DAY 30 tablet 1  . LORazepam (ATIVAN) 0.5 MG tablet Take 1-2 tablets every 8 hrs prn anxiety or nausea; may take 3 tablets for evening dose. 120 tablet 0  . Multiple Vitamins-Minerals (MULTIVITAMIN WITH MINERALS) tablet Take 1 tablet by mouth daily.    Marland Kitchen NEXIUM 24HR 20 MG capsule   0  . ondansetron (ZOFRAN) 8 MG tablet Take 1 tablet (8 mg total) by mouth every 8 (eight) hours as needed for nausea or vomiting. 30 tablet 4  . Probiotic Product (PROBIOTIC DAILY PO) Take by mouth daily.     . prochlorperazine (COMPAZINE) 10 MG tablet Take 1 tablet (10 mg total) by mouth every 6 (six) hours as needed for nausea or vomiting. 60 tablet 1  . senna (SENOKOT) 8.6 MG tablet Take 1 tablet by mouth daily.    . verapamil (COVERA HS) 180 MG (CO) 24 hr tablet Take 180 mg by mouth at bedtime.    Marland Kitchen HYDROcodone-acetaminophen (NORCO/VICODIN) 5-325 MG per tablet Take 1 tablet by mouth  every 4 (four) hours as needed for moderate pain. (Patient not taking: Reported on 02/17/2015) 120 tablet 0   No current facility-administered medications for this visit.    REVIEW OF SYSTEMS:   Constitutional: Denies fevers, chills or abnormal night sweats Eyes: Denies blurriness of vision, double vision or watery eyes Ears, nose, mouth, throat, and face: Denies mucositis or sore throat Respiratory: Denies cough, dyspnea or wheezes Cardiovascular: Denies palpitation, chest discomfort or lower extremity swelling Gastrointestinal:  Denies nausea, heartburn or change in bowel habits Skin: Denies abnormal skin rashes Lymphatics: Denies new lymphadenopathy or easy bruising Neurological:positive for mild headaches, and left side vision deficits. His left-sided weakness has nearly resolved. Denies numbness, tingling or new weaknesses Behavioral/Psych: Mood is stable, no new changes  All other systems were reviewed with the patient and are negative.  PHYSICAL EXAMINATION: ECOG  PERFORMANCE STATUS: 1 - Symptomatic but completely ambulatory  KPS: 80%  Filed Vitals:   02/17/15 1308  BP: 145/85  Pulse: 78  Temp: 97.8 F (36.6 C)  Resp: 20   Filed Weights   02/17/15 1308  Weight: 249 lb 12.8 oz (113.309 kg)    GENERAL:alert, no distress and comfortable SKIN: skin color, texture, turgor are normal, no rashes or significant lesions HEAD: right-sided posterior craniotomy surgical wound is healing well, no surrounding edema, skin redness or discharge EYES: normal, conjunctiva are pink and non-injected, sclera clear OROPHARYNX:no exudate, no erythema and lips, buccal mucosa, and tongue normal  NECK: supple, thyroid normal size, non-tender, without nodularity LYMPH:  no palpable lymphadenopathy in the cervical, axillary or inguinal LUNGS: clear to auscultation and percussion with normal breathing effort HEART: regular rate & rhythm and no murmurs and no lower extremity edema ABDOMEN:abdomen  soft, non-tender and normal bowel sounds Musculoskeletal:no cyanosis of digits and no clubbing  PSYCH: alert & oriented x 3 with fluent speech NEURO: cranial nerve 2-12 are intact. Left side vision deficits is present in the left upper and mid quadrant. Motor and sensitivity are normal and symmetric on all extremities. Speech is fluent, coordination is grossly intact. Ambulates independently and Lutricia Feil sign negative. EXT: Bilateral pitting edema in ankles, better than before   LABORATORY DATA:  CBC Latest Ref Rng 02/17/2015 02/03/2015 01/20/2015  WBC 4.0 - 10.3 10e3/uL 9.6 9.7 7.0  Hemoglobin 13.0 - 17.1 g/dL 14.1 14.0 14.7  Hematocrit 38.4 - 49.9 % 42.4 41.3 44.4  Platelets 140 - 400 10e3/uL 150 136(L) 129(L)    CMP Latest Ref Rng 02/17/2015 01/20/2015 12/21/2014  Glucose 70 - 140 mg/dl 98 104 122  BUN 7.0 - 26.0 mg/dL 22.7 23.1 17.9  Creatinine 0.7 - 1.3 mg/dL 0.8 0.8 0.8  Sodium 136 - 145 mEq/L 138 139 136  Potassium 3.5 - 5.1 mEq/L 4.1 3.9 4.5  Chloride 96 - 112 mEq/L - - -  CO2 22 - 29 mEq/L 25 22 17(L)  Calcium 8.4 - 10.4 mg/dL 9.5 9.2 9.7  Total Protein 6.4 - 8.3 g/dL 5.9(L) 6.2(L) 6.7  Total Bilirubin 0.20 - 1.20 mg/dL 0.57 0.48 0.37  Alkaline Phos 40 - 150 U/L 57 67 65  AST 5 - 34 U/L 22 20 22   ALT 0 - 55 U/L 32 34 30     Surgical path 07/07/2014  Diagnosis 1. Brain, biopsy - GLIOBLASTOMA MULTIFORME, WHO GRADE IV/IV. - SEE ONCOLOGY TABLE BELOW. 2. Brain, for tumor resection, higher right parietal - GLIOBLASTOMA MULTIFORME, WHO GRADE IV/IV. - SEE ONCOLOGY TABLE BELOW. 3. Brain, for tumor resection, lower right occipital - GLIOBLASTOMA MULTIFORME, WHO GRADE IV/IV   Microscopic Comment 1. -3. ONCOLOGY TABLE - BRAIN AND SPINAL CORD 1. Procedure: Resection x2 2. Tumor site, including laterality: Right parietal and right occipital 3. Maximum tumor size (cm): At least 1.8 cm 4. Histologic type: Glioblastoma multiforme 5. Grade: WHO Grade IV/IV 6. Margins (if applicable):  Can not be assessed 7. Ancillary studies: Can be performed upon clinician request.  RADIOGRAPHIC STUDIES: I have personally reviewed the radiological images as listed and agreed with the findings in the report.  Jake Jake [name] Contrast 07/08/2014    IMPRESSION:  1. Postoperative changes from interval right occipital craniotomy with resection of right parieto-occipital mass lesions.  2. 1.3 x 0.5 x 0.5 cm area of enhancement at the anterior margin of the more superior resection cavity in the right parieto-occipital region. It is unclear whether this  reflects residual tumor or possibly normal enhancement of the choroid plexus in the adjacent compressed occipital horn of the right lateral ventricle. Attention on follow-up recommended.  3. Fairly linear enhancement about the more inferior resection cavity without definite evidence of residual tumor.  4. Small amount of associated postoperative infarct at the medial aspect of the more superior resection site.  5. Stable 6 mm focus of restricted diffusion in the left frontal lobe, compatible with an acute or subacute infarct.  6. Slightly improved vasogenic edema within the right parieto-occipital region with persistent 7 mm of right-to-left midline shift.     Electronically Signed   By: Jeannine Boga M.D.   On: 07/08/2014 23:45   10/14/2014 Jake [name] GU Contrast  IMPRESSION: Two discrete foci of enhancement in the RIGHT occipital lobe surround the larger resection cavity, and are suspected to represent foci of residual tumor/GBM displaying interval growth. Recommend continued short-term follow-up..     12/13/2014 Jake [name] [name] Contrast  IMPRESSION: Increased size/ enhancement of 2 lesions adjacent to the right occipital resection cavity, concerning for growth of residual tumor. Mildly increased surrounding T2 hyperintensity.  ASSESSMENT & PLAN:  Jake. [name] is a 59 year old gentleman with newly diagnosed right parietal GBM, status post  subtotal resection. He has recovered very well, except mild headaches and left-sided vision deficits. He has finished concurrent chemoradiation or week ago.  1. GBM, s/p subtotal resection, and concurrent chemoRT completed on 09/15/2014, progressed on adjuvant Temodar, now on second line Avastin -I reviewed his restaging brain MRI from 4.12.16,  previous 2 small foci of enhancement around the surgical site has significantly increased on the repeated ischemic. This was reviewed in our brain tumor board this morning, and we agreed this is tumor for progression. Unfortunately, due to the position of this 2 lesions, subtotal resection are unlikely and surgery is not offered -We discussed systemic therapy options. I recommend second line Avastin. He progressed through Temodar, I'll stop Temodar. -We also discussed clinical trial options in another institution, if he would like to pursue. He is now interested in the vaccine trial in [place] , I'll refer him. - he is tolerating Avastin well, we'll continue -We'll continue dexamethasone at 4mg  and 2mg   Alternative a dose 4 week, if he does well,  Will taper to 2 mg daily -cont Nexium daily when he is on steroids, vitamin the vitamin D and calcium, and regular exercise. -Follow up with radiation oncology Dr. Isidore Moos  In July - he is scheduled to have a repeat his brain MRI in early July   2. Anorexia, fatigue, nausea -Improved after resume dexamethasone -Continue dexamethasone, we'll gradually wean off after he starts Avastin -Follow up with dietitian. Use Zofran as needed.  Plan: -The 4th cycle Avast today and continue every 2 weeks  - Repeat brain MRI  In early July - refer to [facility] neuro Oncology program  for clinical trial -Return to clinic in 2 weeks, possible reduce his dexamethasone dose to 2mg  daily if he is doing well    All questions were answered. The patient knows to call the clinic with any problems, questions or concerns.  I spent 20  minutes counseling the patient face to face. The total time spent in the appointment was 25 minutes and more than 50% was on counseling.     Truitt Merle, MD 02/17/2015 1:49 PM

## 2015-02-17 NOTE — Patient Instructions (Signed)
[facility] Discharge Instructions for Patients Receiving Chemotherapy  Today you received the following chemotherapy agents Avastin To help prevent nausea and vomiting after your treatment, we encourage you to take your nausea medication as prescribed.   If you develop nausea and vomiting that is not controlled by your nausea medication, call the clinic.   BELOW ARE SYMPTOMS THAT SHOULD BE REPORTED IMMEDIATELY:  *FEVER GREATER THAN 100.5 F  *CHILLS WITH OR WITHOUT FEVER  NAUSEA AND VOMITING THAT IS NOT CONTROLLED WITH YOUR NAUSEA MEDICATION  *UNUSUAL SHORTNESS OF BREATH  *UNUSUAL BRUISING OR BLEEDING  TENDERNESS IN MOUTH AND THROAT WITH OR WITHOUT PRESENCE OF ULCERS  *URINARY PROBLEMS  *BOWEL PROBLEMS  UNUSUAL RASH Items with * indicate a potential emergency and should be followed up as soon as possible.  Feel free to call the clinic you have any questions or concerns. The clinic phone number is [phone].  Please show the CHEMO ALERT CARD at check-in to the Emergency Department and triage nurse.   

## 2015-02-20 ENCOUNTER — Ambulatory Visit: Payer: Commercial Managed Care - PPO | Admitting: Nurse Practitioner

## 2015-03-01 ENCOUNTER — Other Ambulatory Visit: Payer: Self-pay

## 2015-03-01 MED ORDER — VERAPAMIL HCL 180 MG (CO) PO TB24: 180.0000 mg | ORAL_TABLET | Freq: Every day | ORAL | Status: DC

## 2015-03-03 ENCOUNTER — Ambulatory Visit (HOSPITAL_BASED_OUTPATIENT_CLINIC_OR_DEPARTMENT_OTHER): Payer: BLUE CROSS/BLUE SHIELD

## 2015-03-03 ENCOUNTER — Other Ambulatory Visit (HOSPITAL_BASED_OUTPATIENT_CLINIC_OR_DEPARTMENT_OTHER): Payer: BLUE CROSS/BLUE SHIELD

## 2015-03-03 ENCOUNTER — Ambulatory Visit (HOSPITAL_BASED_OUTPATIENT_CLINIC_OR_DEPARTMENT_OTHER): Payer: BLUE CROSS/BLUE SHIELD | Admitting: Physician Assistant

## 2015-03-03 ENCOUNTER — Ambulatory Visit (INDEPENDENT_AMBULATORY_CARE_PROVIDER_SITE_OTHER): Payer: BLUE CROSS/BLUE SHIELD | Admitting: Family Medicine

## 2015-03-03 VITALS — BP 123/76 | HR 79

## 2015-03-03 VITALS — BP 134/76 | HR 89 | Temp 97.6°F | Resp 18 | Ht 71.0 in | Wt 253.6 lb

## 2015-03-03 DIAGNOSIS — Z79899 Other long term (current) drug therapy: Secondary | ICD-10-CM

## 2015-03-03 DIAGNOSIS — Z720 Tobacco use: Secondary | ICD-10-CM | POA: Diagnosis not present

## 2015-03-03 DIAGNOSIS — H538 Other visual disturbances: Secondary | ICD-10-CM

## 2015-03-03 DIAGNOSIS — R530 Neoplastic (malignant) related fatigue: Secondary | ICD-10-CM

## 2015-03-03 DIAGNOSIS — C714 Malignant neoplasm of occipital lobe: Secondary | ICD-10-CM

## 2015-03-03 DIAGNOSIS — R5383 Other fatigue: Secondary | ICD-10-CM

## 2015-03-03 DIAGNOSIS — C719 Malignant neoplasm of brain, unspecified: Secondary | ICD-10-CM

## 2015-03-03 DIAGNOSIS — R51 Headache: Secondary | ICD-10-CM

## 2015-03-03 DIAGNOSIS — R609 Edema, unspecified: Secondary | ICD-10-CM

## 2015-03-03 DIAGNOSIS — R110 Nausea: Secondary | ICD-10-CM

## 2015-03-03 DIAGNOSIS — I10 Essential (primary) hypertension: Secondary | ICD-10-CM

## 2015-03-03 DIAGNOSIS — C713 Malignant neoplasm of parietal lobe: Secondary | ICD-10-CM

## 2015-03-03 DIAGNOSIS — E669 Obesity, unspecified: Secondary | ICD-10-CM | POA: Diagnosis not present

## 2015-03-03 DIAGNOSIS — E039 Hypothyroidism, unspecified: Secondary | ICD-10-CM | POA: Diagnosis not present

## 2015-03-03 DIAGNOSIS — R630 Anorexia: Secondary | ICD-10-CM

## 2015-03-03 DIAGNOSIS — G4700 Insomnia, unspecified: Secondary | ICD-10-CM

## 2015-03-03 DIAGNOSIS — Z5112 Encounter for antineoplastic immunotherapy: Secondary | ICD-10-CM

## 2015-03-03 DIAGNOSIS — Z125 Encounter for screening for malignant neoplasm of prostate: Secondary | ICD-10-CM | POA: Diagnosis not present

## 2015-03-03 DIAGNOSIS — F1920 Other psychoactive substance dependence, uncomplicated: Secondary | ICD-10-CM | POA: Diagnosis not present

## 2015-03-03 DIAGNOSIS — T380X5S Adverse effect of glucocorticoids and synthetic analogues, sequela: Secondary | ICD-10-CM

## 2015-03-03 LAB — CBC WITH DIFFERENTIAL/PLATELET
BASO%: 0.2 % (ref 0.0–2.0)
BASOS ABS: 0 10*3/uL (ref 0.0–0.1)
EOS ABS: 0 10*3/uL (ref 0.0–0.5)
EOS%: 0.4 % (ref 0.0–7.0)
HEMATOCRIT: 40.1 % (ref 38.4–49.9)
HEMOGLOBIN: 13.4 g/dL (ref 13.0–17.1)
LYMPH%: 17.2 % (ref 14.0–49.0)
MCH: 30 pg (ref 27.2–33.4)
MCHC: 33.4 g/dL (ref 32.0–36.0)
MCV: 89.7 fL (ref 79.3–98.0)
MONO#: 0.6 10*3/uL (ref 0.1–0.9)
MONO%: 7 % (ref 0.0–14.0)
NEUT#: 6.1 10*3/uL (ref 1.5–6.5)
NEUT%: 75.2 % — ABNORMAL HIGH (ref 39.0–75.0)
Platelets: 139 10*3/uL — ABNORMAL LOW (ref 140–400)
RBC: 4.47 10*6/uL (ref 4.20–5.82)
RDW: 18.3 % — AB (ref 11.0–14.6)
WBC: 8.1 10*3/uL (ref 4.0–10.3)
lymph#: 1.4 10*3/uL (ref 0.9–3.3)
nRBC: 0 % (ref 0–0)

## 2015-03-03 LAB — TECHNOLOGIST REVIEW

## 2015-03-03 LAB — TSH: TSH: 0.907 u[IU]/mL (ref 0.350–4.500)

## 2015-03-03 MED ORDER — SODIUM CHLORIDE 0.9 % IV SOLN
1100.0000 mg | Freq: Once | INTRAVENOUS | Status: AC
  Administered 2015-03-03: 1100 mg via INTRAVENOUS
  Filled 2015-03-03: qty 44

## 2015-03-03 MED ORDER — SODIUM CHLORIDE 0.9 % IV SOLN
Freq: Once | INTRAVENOUS | Status: AC
  Administered 2015-03-03: 13:00:00 via INTRAVENOUS

## 2015-03-03 MED ORDER — CLONAZEPAM 1 MG PO TABS: 1.0000 mg | ORAL_TABLET | Freq: Every day | ORAL | Status: DC

## 2015-03-03 NOTE — Progress Notes (Signed)
Subjective:    Patient ID: Jake [name], male    DOB: [birth year], 59 y.o.   MRN: [id number] Chief Complaint  Patient presents with  . Hypertension    yearly follow up    HPI  This is a patient of Dr. Pauletta Browns here to f/u on routine HM since Dr. Carlean Jews is retiring.  I last saw pt 8 mos ago when I diagnosed him with sinusitis after 1 week of HA.  Over the following 4d, his HA and sinus pressure worsened to where he was having difficulty walking - he was so off balance that he was in a wheelchair, his depth perception was off, int blurred vision, and had the worst headache of life as well as left-sided weakness.  Pt was transferred to the ER by EMS where he was found on imaging to have a brain tumor and 5d later he underwent a craniotomy to remove tumor which was a glioblastoma multiforme.   Pt is here today for a refill on his chronic medications of levothyroxine 50 and lisinopril 40.  Lipids last year were all right - ldl 72 but trig 181   Almost all of the brain tumor was removed initially after diagnosis except for 2 little slivers so started on chemo - then had to switch to a different one.  Still getting chemo every other week - unknown how much longer but approved through the end of the year.  Tried to go off prior but the tumor grew in just 6 wks so had to restart.  Also on daily dexamethasone to decreases neurologic sxs from tumor - was taken off prior but he felt horrible and sxs rapidly worsened so was restarted - has weaned down to 4mg  alt w/ 2mg  qd of dexamethasone and next week going to decrease to 2mg  qd  Having severe side effects from the dexamethasone - can't sleep - will sleep 3 1/2-5 hrs at night with the ativan - makes him fall asleep but doesn't keep him asleep.  Tried temazepam prior without any effect at all, [place] caused weird effects and to much hangover - he got into the shower with his clothes on one morning.  He will often nap again between 6-7 and occ naps during day  but tries to minimize as he knows that worsens the night.  No h/o CAD, murmurs, or prior cardiology eval. No CP, chest tightness, SHoB, orthopnea, or paroxysmal nocturnal dyspnea.  Is snoring more with weight gain.  His wife works at [employer] so was able to get pt a 5d emergency supply of his meds to last hm to todays appt - she works wkends often and many times her days off of work are spent with her husband taking him to appts, therapy, etc.  Very stressful to not have any free time but is happy that she gets to spend more time with her husband as often times they are together all day w/ this - they will often watch tv together on a tablet while he is getting his chemo treatments.   Past Medical History  Diagnosis Date  . Thyroid disease   . Hypertension   . Allergy   . Arthritis   . Sleep apnea   . Kidney stone   . Glioblastoma multiforme of occipital lobe   . Nocturnal leg cramps 09/29/2014  . History of radiation therapy 08/03/14- 09/15/14    right occipito-parietal brain/46 Gy 23 fx, right brain boost/ 14 Gy in 7 fx   Past  Surgical History  Procedure Laterality Date  . Knee arthroscopy w/ meniscal repair    . Treatment fistula anal    . Craniotomy N/A 07/07/2014    Procedure: CRANIOTOMY TUMOR EXCISION/CURVE;  Surgeon: Elaina Hoops, MD;  Location: [facility];  Service: Neurosurgery;  Laterality: N/A;   Current Outpatient Prescriptions on File Prior to Visit  Medication Sig Dispense Refill  . dexamethasone (DECADRON) 4 MG tablet 1 tab daily (Patient taking differently: Take 2mg  alternate with 4mg  daily.) 30 tablet 1  . docusate sodium (COLACE) 100 MG capsule Take 100 mg by mouth 2 (two) times daily.    Marland Kitchen HYDROcodone-acetaminophen (NORCO/VICODIN) 5-325 MG per tablet Take 1 tablet by mouth every 4 (four) hours as needed for moderate pain. 120 tablet 0  . LORazepam (ATIVAN) 0.5 MG tablet Take 1-2 tablets every 8 hrs prn anxiety or nausea. 60 tablet 0  . Multiple Vitamins-Minerals  (MULTIVITAMIN WITH MINERALS) tablet Take 1 tablet by mouth daily.    Marland Kitchen NEXIUM 24HR 20 MG capsule   0  . ondansetron (ZOFRAN) 8 MG tablet Take 1 tablet (8 mg total) by mouth every 8 (eight) hours as needed for nausea or vomiting. 30 tablet 4  . Probiotic Product (PROBIOTIC DAILY PO) Take by mouth daily.     . prochlorperazine (COMPAZINE) 10 MG tablet Take 1 tablet (10 mg total) by mouth every 6 (six) hours as needed for nausea or vomiting. 60 tablet 1  . senna (SENOKOT) 8.6 MG tablet Take 1 tablet by mouth daily.     No current facility-administered medications on file prior to visit.   Not on File Family History  Problem Relation Age of Onset  . Hypertension Mother   . Diabetes Father   . Glaucoma Father   . Diabetes Brother   . Diabetes Brother   . Colon cancer Neg Hx    History   Social History  . Marital Status: Married    Spouse Name: N/A  . Number of Children: 5  . Years of Education: college   Social History Main Topics  . Smoking status: Current Every Day Smoker -- 1.00 packs/day for 45 years    Types: Cigarettes  . Smokeless tobacco: Never Used  . Alcohol Use: Yes     Comment: 2-3 beers per/day   . Drug Use: No  . Sexual Activity: Not on file   Other Topics Concern  . Not on file   Social History Narrative   Patient is right handed.   Patient drinks 2 cups daily      Depression screen [facility] 2/9 03/03/2015 08/22/2014 08/08/2014 07/22/2014  Decreased Interest 0 0 0 0  Down, Depressed, Hopeless 0 0 0 0  PHQ - 2 Score 0 0 0 0      Review of Systems  Constitutional: Positive for activity change, appetite change, fatigue and unexpected weight change. Negative for fever and chills.  Eyes: Negative for visual disturbance.  Respiratory: Negative for apnea, chest tightness and shortness of breath.        + snoring  Cardiovascular: Positive for leg swelling. Negative for chest pain and palpitations.  Gastrointestinal: Negative for nausea, vomiting and abdominal pain.   Musculoskeletal: Positive for joint swelling.  Skin: Negative for rash.  Neurological: Positive for weakness. Negative for dizziness, seizures, syncope, facial asymmetry and speech difficulty.  Psychiatric/Behavioral: Positive for sleep disturbance. Negative for confusion and dysphoric mood.       Objective:   Physical Exam  Constitutional: He is oriented  to person, place, and time. He appears well-developed and well-nourished. No distress.  Facial plethora Large body habitus  HENT:  Head: Normocephalic and atraumatic.  Eyes: No scleral icterus.  Cardiovascular:  Heart sounds very difficult to hear due to body habitus - I thought I felt a thrill along right lower sternal border but could not be replicated so suspect it might have been muscle spasm/twitching similar to which pt sometimes has in his arms.  Bowel sounds heard in chest but pt denies h/o HH or sig GERD sxs.  Pulmonary/Chest: Effort normal. He has no decreased breath sounds. He has no wheezes. He has no rhonchi. He has rales.  Subtle inspiratory rales resolved w/ repeated inhalations 2+ pedal edema to mid calf B.  Neurological: He is alert and oriented to person, place, and time.  Skin: Skin is warm and dry. He is not diaphoretic.  Psychiatric: He has a normal mood and affect. His behavior is normal.          Assessment & Plan:  Low total protein and albumin. He has his cbc, smear, and cmp done every month at least at cancer center but has not had tsh. Need flp at next OV whenever fasting Pt will sched for a full CPE - if cardiac exam still atypical or difficult to hear at that time will get EKG and consider cards referral.  1. Hypothyroidism, unspecified hypothyroidism type - tsh stable for yrs on 50 mcg  2. Essential hypertension - well controlled - refilled verapamil and lisinopril  3. Glioblastoma of occipital lobe - trying to wean down/off on dexamethasone but very slowly as sxs recurred rapidly last time he was  attempted to be taken off  4. Polypharmacy   5. Tobacco abuse - did not discuss today - i suspect he has stopped due to comorbidities - discuss at f/u  6. Screening for prostate cancer - ok to use prn viagra  7.     Insomnia - due to dexamethasone but only able to sleep for 3 1/2-5 hrs every night, often naps so trying to cut down, failed temazepam and ambien- try klonopin but may need to try 1 klonopin and 1 ativan if he needs to balance falling asleep and staying asleep.  Call if not working to try something else 8.   Obesity - on dexamethasone for many mos causes severe pedal edema, abd obesity 9.     Edema - increase water, decrease salt, elevate whenever possible. 10.    Corticosteroid dependency and side effects -  Long-standing dexamethasone causing severe facial and abd obesity - concerned pt is developing cushing's.  Watch sugar - check hgba1c at f/u. Check vit D at f/u, cons dexa.  Orders Placed This Encounter  Procedures  . TSH  . PSA    Meds ordered this encounter  Medications  . bevacizumab in sodium chloride 0.9 % 100 mL    Sig: Inject into the vein every 14 (fourteen) days.  . clonazePAM (KLONOPIN) 1 MG tablet    Sig: Take 1-2 tablets (1-2 mg total) by mouth at bedtime.    Dispense:  30 tablet    Refill:  3  . verapamil (COVERA HS) 180 MG (CO) 24 hr tablet    Sig: Take 1 tablet (180 mg total) by mouth at bedtime.    Dispense:  90 tablet    Refill:  3  . lisinopril (PRINIVIL,ZESTRIL) 40 MG tablet    Sig: Take 1 tablet (40 mg total) by mouth daily.  Dispense:  90 tablet    Refill:  3  . levothyroxine (SYNTHROID, LEVOTHROID) 50 MCG tablet    Sig: Take 1 tablet (50 mcg total) by mouth daily before breakfast.    Dispense:  90 tablet    Refill:  3    [name], MD MPH

## 2015-03-03 NOTE — Patient Instructions (Signed)
[facility] Discharge Instructions for Patients Receiving Chemotherapy  Today you received the following chemotherapy agents Avastin To help prevent nausea and vomiting after your treatment, we encourage you to take your nausea medication as prescribed.   If you develop nausea and vomiting that is not controlled by your nausea medication, call the clinic.   BELOW ARE SYMPTOMS THAT SHOULD BE REPORTED IMMEDIATELY:  *FEVER GREATER THAN 100.5 F  *CHILLS WITH OR WITHOUT FEVER  NAUSEA AND VOMITING THAT IS NOT CONTROLLED WITH YOUR NAUSEA MEDICATION  *UNUSUAL SHORTNESS OF BREATH  *UNUSUAL BRUISING OR BLEEDING  TENDERNESS IN MOUTH AND THROAT WITH OR WITHOUT PRESENCE OF ULCERS  *URINARY PROBLEMS  *BOWEL PROBLEMS  UNUSUAL RASH Items with * indicate a potential emergency and should be followed up as soon as possible.  Feel free to call the clinic you have any questions or concerns. The clinic phone number is [phone].  Please show the CHEMO ALERT CARD at check-in to the Emergency Department and triage nurse.   

## 2015-03-04 LAB — PSA: PSA: 0.61 ng/mL (ref <=4.00)

## 2015-03-04 MED ORDER — VERAPAMIL HCL 180 MG (CO) PO TB24: 180.0000 mg | ORAL_TABLET | Freq: Every day | ORAL | Status: AC

## 2015-03-04 MED ORDER — LEVOTHYROXINE SODIUM 50 MCG PO TABS: 50.0000 ug | ORAL_TABLET | Freq: Every day | ORAL | Status: DC

## 2015-03-04 MED ORDER — LISINOPRIL 40 MG PO TABS: 40.0000 mg | ORAL_TABLET | Freq: Every day | ORAL | Status: DC

## 2015-03-08 ENCOUNTER — Telehealth: Payer: Self-pay | Admitting: *Deleted

## 2015-03-08 ENCOUNTER — Telehealth: Payer: Self-pay | Admitting: Hematology

## 2015-03-08 ENCOUNTER — Other Ambulatory Visit: Payer: Self-pay | Admitting: Hematology and Oncology

## 2015-03-08 DIAGNOSIS — C714 Malignant neoplasm of occipital lobe: Secondary | ICD-10-CM

## 2015-03-08 MED ORDER — HYDROCODONE-ACETAMINOPHEN 5-325 MG PO TABS: 1.0000 | ORAL_TABLET | Freq: Four times a day (QID) | ORAL | Status: DC | PRN

## 2015-03-08 NOTE — Telephone Encounter (Signed)
Left message with note below. To call if has questions 

## 2015-03-08 NOTE — Progress Notes (Signed)
[facility] FOLLOW UP NOTE  Patient Care Team: [name], MD as PCP - General (Family Medicine) Kary Kos, MD as Consulting Physician (Neurosurgery) Eppie Gibson, MD as Attending Physician (Radiation Oncology) Truitt Merle, MD as Consulting Physician (Hematology)     Glioblastoma of occipital lobe   07/02/2014 Imaging Brain MRI: Two adjacent enhancing mass lesions in the right parietal lobe with central necrosis and extensive surrounding edema and mass effect.  7 mm midline shift to the left.  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   CT CAP(-)   07/07/2014 Surgery Subtotal right parietal tumors resection    08/03/2014 Concurrent Chemotherapy Concurrent radiation with Temodar 75 mg/m daily, completed on 09/15/2014.    09/29/2014 - 12/28/2014 Chemotherapy Adjuvant Temodar, 150-200mg /m2 for 5 days, every 28 day cycle, s/p 3 cycles, stopped due to disease progression.   10/14/2014 Imaging Two discrete foci of enhancement in the RIGHT occipital lobe surround the larger resection cavity, and are suspected to represent foci of residual tumor/GBM displaying interval growt   12/13/2014 Imaging Increased size/ enhancement of 2 lesions adjacent to the right occipital resection cavity, concerning for growth of residual tumor.   01/06/2015 -  Chemotherapy Bevacizumab 10mg /kg every 2 weeks    GBM (glioblastoma multiforme)   07/22/2014 Initial Diagnosis GBM (glioblastoma multiforme)    CURRENT THERAPY: Bevacizumab 10 mg/kg every 2 weeks, started on 01/06/2059 dexa 4mg  daily until 6/10, changed to 4mg  and 2mg  daily alternatively,  CHIEF COMPLAINTS: follow up GBM   HISTORY OF INITIAL PRESENTATION;   He initially presented was headaches and sinus congestion in mid October 2015. He was seen by his primary care physician and was treated for sinus infection. He subsequently developed left sided visual deficit, left arm and leg weakness and unsteady gait. He was sent to emergency  room by his primary care physician on 07/02/2014. CT  And MRI of brain reviewed to add adjacent enhancing mass lesions in the right parietal lobe with central necrosis and extensive surrounding edema and mass effect. He was admitted and underwent right craniotomy  For resection of the 2 brain masses by Dr. Lonell Grandchild. Postsurgical brain MRI revealed a 1.3 cm area of enhancement at the anterior margin of surgical resection cavity, likely surgical change. His case was reviewed in the tumor board and it was felt he had subtotal resection.  INTERVAL HISTROY: Jake [name] returns for follow-up. He tolerated avastin infusion well. He is on dexa 4/2mg  daily alternatively, and has tolerated this well for the past 2 weeks. He has been on dexamethasone since November 2015. He is looking forward to completing a taper and being able to ome off of the dexamethasone. He complains about some skin peeling at his fingertips. Reports no significant neuropathy,continues to have moderate fatigue, leg weakness. He continues to push himself to walk more, but needs to sit down due to fatigue.  His wife has been giving him a homeopathic sleep aide for the past couple of nights to help him sleep. His wife notes some confusion in [place]. She will hold the sleep aid for now and see if the confusion clears. He does have an upcoming restaging MRI of the brain scheduled for 03/10/2015. He denied double vision, or other new neuro symptoms.    MEDICAL HISTORY:  Past Medical History  Diagnosis Date  . Thyroid disease   . Hypertension   . Allergy   . Arthritis   .    Marland Kitchen Kidney stone  SURGICAL HISTORY: Past Surgical History  Procedure Laterality Date  . Knee arthroscopy w/ meniscal repair    . Treatment fistula anal    . 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 Marital Status: Married    Spouse Name: N/A    Number of Children: 5 children, age of 77-21  . Years of Education: N/A   Occupational History  . 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    Social History Main Topics  . Smoking status: Current Every Day Smoker -- 1.00 packs/day for 45 years    Types: Cigarettes  . Smokeless tobacco: Never Used  . Alcohol Use: Yes     Comment: 2-3 beers per/day   . Drug Use: No  . Sexual Activity: Not on file     FAMILY HISTORY: Family History  Problem Relation Age of Onset  . Hypertension Mother   . Diabetes Father   . Glaucoma Father   . Diabetes Brother   . Diabetes Brother   . Colon cancer Neg Hx   Paternal ancle had prostate cancer. No other family history of malignancy.  ALLERGIES:  has no allergies on file.   MEDICATIONS:  Current Outpatient Prescriptions  Medication Sig Dispense Refill  . dexamethasone (DECADRON) 4 MG tablet 1 tab daily (Patient taking differently: Take 2mg  alternate with 4mg  daily.) 30 tablet 1  . docusate sodium (COLACE) 100 MG capsule Take 100 mg by mouth 2 (two) times daily.    Marland Kitchen LORazepam (ATIVAN) 0.5 MG tablet Take 1-2 tablets every 8 hrs prn anxiety or nausea. 60 tablet 0  . Multiple Vitamins-Minerals (MULTIVITAMIN WITH MINERALS) tablet Take 1 tablet by mouth daily.    Marland Kitchen NEXIUM 24HR 20 MG capsule   0  . ondansetron (ZOFRAN) 8 MG tablet Take 1 tablet (8 mg total) by mouth every 8 (eight) hours as needed for nausea or vomiting. 30 tablet 4  . Probiotic Product (PROBIOTIC DAILY PO) Take by mouth daily.     . prochlorperazine (COMPAZINE) 10 MG tablet Take 1 tablet (10 mg total) by mouth every 6 (six) hours as needed for nausea or vomiting. 60 tablet 1  . senna (SENOKOT) 8.6 MG tablet Take 1 tablet by mouth daily.    . bevacizumab in sodium chloride 0.9 % 100 mL Inject into the vein every 14 (fourteen) days.    . clonazePAM (KLONOPIN) 1 MG tablet Take 1-2 tablets (1-2 mg  total) by mouth at bedtime. 30 tablet 3  . HYDROcodone-acetaminophen (NORCO/VICODIN) 5-325 MG per tablet Take 1 tablet by mouth every 6 (six) hours as needed for moderate pain. 30 tablet 0  . levothyroxine (SYNTHROID, LEVOTHROID) 50 MCG tablet Take 1 tablet (50 mcg total) by mouth daily before breakfast. 90 tablet 3  . lisinopril (PRINIVIL,ZESTRIL) 40 MG tablet Take 1 tablet (40 mg total) by mouth daily. 90 tablet 3  . verapamil (COVERA HS) 180 MG (CO) 24 hr tablet Take 1 tablet (180 mg total) by mouth at bedtime. 90 tablet 3   No current facility-administered medications for this visit.    REVIEW OF SYSTEMS:   Constitutional: Denies fevers, chills or abnormal night sweats Eyes: Denies blurriness of vision, double vision or watery eyes Ears, nose, mouth, throat, and face: Denies mucositis or sore throat Respiratory: Denies cough,  dyspnea or wheezes Cardiovascular: Denies palpitation, chest discomfort or lower extremity swelling Gastrointestinal:  Denies nausea, heartburn or change in bowel habits Skin: Denies abnormal skin rashes Lymphatics: Denies new lymphadenopathy or easy bruising Neurological:positive for mild headaches, and left side vision deficits. His left-sided weakness has nearly resolved. Denies numbness, tingling or new weaknesses Behavioral/Psych: Mood is stable, no new changes  All other systems were reviewed with the patient and are negative.  PHYSICAL EXAMINATION: ECOG PERFORMANCE STATUS: 1 - Symptomatic but completely ambulatory  KPS: 80%  Filed Vitals:   03/03/15 1217  BP: 134/76  Pulse: 89  Temp: 97.6 F (36.4 C)  Resp: 18   Filed Weights   03/03/15 1217  Weight: 253 lb 9.6 oz (115.032 kg)    GENERAL:alert, no distress and comfortable SKIN: skin color, texture, turgor are normal, no rashes or significant lesions HEAD: right-sided posterior craniotomy surgical wound is healing well, no surrounding edema, skin redness or discharge EYES: normal, conjunctiva  are pink and non-injected, sclera clear OROPHARYNX:no exudate, no erythema and lips, buccal mucosa, and tongue normal  NECK: supple, thyroid normal size, non-tender, without nodularity LYMPH:  no palpable lymphadenopathy in the cervical, axillary or inguinal LUNGS: clear to auscultation and percussion with normal breathing effort HEART: regular rate & rhythm and no murmurs and no lower extremity edema ABDOMEN:abdomen soft, non-tender and normal bowel sounds Musculoskeletal:no cyanosis of digits and no clubbing  PSYCH: alert & oriented x 3 with fluent speech NEURO: cranial nerve 2-12 are intact. Left side vision deficits is present in the left upper and mid quadrant. Motor and sensitivity are normal and symmetric on all extremities. Speech is fluent, coordination is grossly intact. Ambulates independently and Lutricia Feil sign negative. EXT: Bilateral 1+ to 2+ pitting edema in ankles  LABORATORY DATA:  CBC Latest Ref Rng 03/03/2015 02/17/2015 02/03/2015  WBC 4.0 - 10.3 10e3/uL 8.1 9.6 9.7  Hemoglobin 13.0 - 17.1 g/dL 13.4 14.1 14.0  Hematocrit 38.4 - 49.9 % 40.1 42.4 41.3  Platelets 140 - 400 10e3/uL 139(L) 150 136(L)    CMP Latest Ref Rng 02/17/2015 01/20/2015 12/21/2014  Glucose 70 - 140 mg/dl 98 104 122  BUN 7.0 - 26.0 mg/dL 22.7 23.1 17.9  Creatinine 0.7 - 1.3 mg/dL 0.8 0.8 0.8  Sodium 136 - 145 mEq/L 138 139 136  Potassium 3.5 - 5.1 mEq/L 4.1 3.9 4.5  Chloride 96 - 112 mEq/L - - -  CO2 22 - 29 mEq/L 25 22 17(L)  Calcium 8.4 - 10.4 mg/dL 9.5 9.2 9.7  Total Protein 6.4 - 8.3 g/dL 5.9(L) 6.2(L) 6.7  Total Bilirubin 0.20 - 1.20 mg/dL 0.57 0.48 0.37  Alkaline Phos 40 - 150 U/L 57 67 65  AST 5 - 34 U/L 22 20 22   ALT 0 - 55 U/L 32 34 30     Surgical path 07/07/2014  Diagnosis 1. Brain, biopsy - GLIOBLASTOMA MULTIFORME, WHO GRADE IV/IV. - SEE ONCOLOGY TABLE BELOW. 2. Brain, for tumor resection, higher right parietal - GLIOBLASTOMA MULTIFORME, WHO GRADE IV/IV. - SEE ONCOLOGY TABLE  BELOW. 3. Brain, for tumor resection, lower right occipital - GLIOBLASTOMA MULTIFORME, WHO GRADE IV/IV   Microscopic Comment 1. -3. ONCOLOGY TABLE - BRAIN AND SPINAL CORD 1. Procedure: Resection x2 2. Tumor site, including laterality: Right parietal and right occipital 3. Maximum tumor size (cm): At least 1.8 cm 4. Histologic type: Glioblastoma multiforme 5. Grade: WHO Grade IV/IV 6. Margins (if applicable): Can not be assessed 7. Ancillary studies: Can be performed upon clinician request.  RADIOGRAPHIC STUDIES: I have personally reviewed the radiological images as listed and agreed with the findings in the report.  Jake [name] Wo Contrast 07/08/2014    IMPRESSION:  1. Postoperative changes from interval right occipital craniotomy with resection of right parieto-occipital mass lesions.  2. 1.3 x 0.5 x 0.5 cm area of enhancement at the anterior margin of the more superior resection cavity in the right parieto-occipital region. It is unclear whether this reflects residual tumor or possibly normal enhancement of the choroid plexus in the adjacent compressed occipital horn of the right lateral ventricle. Attention on follow-up recommended.  3. Fairly linear enhancement about the more inferior resection cavity without definite evidence of residual tumor.  4. Small amount of associated postoperative infarct at the medial aspect of the more superior resection site.  5. Stable 6 mm focus of restricted diffusion in the left frontal lobe, compatible with an acute or subacute infarct.  6. Slightly improved vasogenic edema within the right parieto-occipital region with persistent 7 mm of right-to-left midline shift.     Electronically Signed   By: Jeannine Boga M.D.   On: 07/08/2014 23:45   10/14/2014 Jake Brain W [facility] Contrast  IMPRESSION: Two discrete foci of enhancement in the RIGHT occipital lobe surround the larger resection cavity, and are suspected to represent foci of residual tumor/GBM  displaying interval growth. Recommend continued short-term follow-up..     12/13/2014 Jake Brain W Wo Contrast  IMPRESSION: Increased size/ enhancement of 2 lesions adjacent to the right occipital resection cavity, concerning for growth of residual tumor. Mildly increased surrounding T2 hyperintensity.  ASSESSMENT & PLAN:  Jake [name] is a 59 year old gentleman with newly diagnosed right parietal GBM, status post subtotal resection. He has recovered very well, except mild headaches and left-sided vision deficits. He has finished concurrent chemoradiation or week ago.  1. GBM, s/p subtotal resection, and concurrent chemoRT completed on 09/15/2014, progressed on adjuvant Temodar, now on second line Avastin -Dr. Burr Medico reviewed his restaging brain MRI from 4.12.16,  previous 2 small foci of enhancement around the surgical site has significantly increased on the repeated ischemic. This was reviewed in brain tumor board , and it was agreed this is tumor for progression. Unfortunately, due to the position of this 2 lesions, subtotal resection are unlikely and surgery is not offered -We discussed systemic therapy options. I recommend second line Avastin. He progressed through Temodar, I'll stop Temodar. -Clinical trial options in another institution have been discussed, if he would like to pursue. He is now interested in the vaccine trial in [name] , Dr Burr Medico referred him but he has yet to hear from them regarding an appointment. - he is tolerating Avastin well, we'll continue -We'll continue to taper the dexamethasone to 2mg  by mouth daily. If he tolerates this well perhaps when he sees Dr. Burr Medico for follow up in 2 weeks he can be further decreased to 2 mg by mouth every other day until we can ultimately taper him completely off of the dexamethasone.   -cont Nexium daily when he is on steroids, vitamin the vitamin D and calcium, and regular exercise. -Follow up with radiation oncology Dr. Isidore Moos  In July - he is  scheduled to have a repeat his brain MRI in early July   2. Anorexia, fatigue, nausea -Improved after resume dexamethasone -Continue dexamethasone, we'll gradually wean off after he starts Avastin -Follow up with dietitian. Use Zofran as needed.  Plan: -The 5th cycle Avastin today and continue every 2 weeks  -  Repeat brain MRI  In early July - refer to [facility] neuro Oncology program  for clinical trial (again) -Return to clinic in 2 weeks, possible reduce his dexamethasone dose to 2mg  every other day if he is doing well   Patient was reviewed with Dr. Burr Medico.  All questions were answered. The patient knows to call the clinic with any problems, questions or concerns.  I spent 20 minutes counseling the patient face to face. The total time spent in the appointment was 25 minutes and more than 50% was on counseling.     Carlton Adam, PA-C 03/08/2015 8:47 PM

## 2015-03-08 NOTE — Telephone Encounter (Signed)
Called [name] neuro oncology clinic to schedule pt.  Gave pt demographics over the phone.  A nurse from the clinic will call pt to gather  more information. Called pt left vm to be expecting a call from [facility].

## 2015-03-08 NOTE — Telephone Encounter (Signed)
Patient's wife [name] called.  "Jake [name] is having trouble staying awake.  Could we have something sent in for joint pain to our [place]?  He has severe joint pain all over but especially in his knees, ankles and elbows for the past two days.  He took two hydrocodone yesterday and one today.  He has a problem with this medicine keeping him from going to the bathroom and would like something specifically for joints.  We also need to know about the trial at [facility] that was mentioned."  With further assessment [name] reports "he saw PCP for B/P, thyroid and sleep.  Was given Clonazepam which is a cousin to the lorazepam he takes.  He's taken this twice and the first night took both lorazepam and clonazepam.  He's started taking the decadron 4 mg daily".  Advised not to take both. Advised this is why he can't stay awake.  Dr. Burr Medico is out of office.  Will notify collaborative nurse and on-call provider.

## 2015-03-08 NOTE — Patient Instructions (Signed)
Decrease your dexamethasone to 2 mg by mouth daily Follow up in 2 weeks

## 2015-03-08 NOTE — Telephone Encounter (Signed)
Agree with assessment not to take clonazepam and lorazepam together to address day time sleepiness I refill a small prescription of hydrocodone until Dr. Burr Medico returns Unfortunately it cannot be called in and patient has to come in for prescription. Ready for pick up

## 2015-03-10 ENCOUNTER — Ambulatory Visit
Admission: RE | Admit: 2015-03-10 | Discharge: 2015-03-10 | Disposition: A | Discharge status: Home / Self Care | Payer: BLUE CROSS/BLUE SHIELD | Source: Ambulatory Visit | Attending: Radiation Oncology | Admitting: Radiation Oncology

## 2015-03-10 DIAGNOSIS — C714 Malignant neoplasm of occipital lobe: Secondary | ICD-10-CM

## 2015-03-10 MED ORDER — GADOBENATE DIMEGLUMINE 529 MG/ML IV SOLN
20.0000 mL | Freq: Once | INTRAVENOUS | Status: AC | PRN
  Administered 2015-03-10: 20 mL via INTRAVENOUS

## 2015-03-13 ENCOUNTER — Telehealth: Payer: Self-pay

## 2015-03-13 NOTE — Telephone Encounter (Signed)
The following email was submitted to your website from [facility] To: [name] MD, I would like to thank you for the care and rapid response with test result from my visit on 7/1. The sleep medication definitely works. Although I was sleepy for 3 days I certainly feel much better. I reduced the dosage to 1 tablet and am staying asleep for 7 to 8 hours. 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 Thank you! Jake [name] Date of birth: Apr 14, 1956

## 2015-03-15 ENCOUNTER — Encounter: Payer: Self-pay | Admitting: Radiation Oncology

## 2015-03-15 ENCOUNTER — Ambulatory Visit
Admission: RE | Admit: 2015-03-15 | Discharge: 2015-03-15 | Disposition: A | Discharge status: Home / Self Care | Payer: 59 | Source: Ambulatory Visit | Attending: Radiation Oncology | Admitting: Radiation Oncology

## 2015-03-15 VITALS — BP 148/84 | HR 93 | Temp 97.8°F | Resp 20 | Wt 253.9 lb

## 2015-03-15 DIAGNOSIS — C714 Malignant neoplasm of occipital lobe: Secondary | ICD-10-CM

## 2015-03-15 NOTE — Progress Notes (Signed)
PAIN: He is currently in no pain. Reports headaches twice a week NEURO: Pt alert & oriented x 3 with fluent speech, gait normal, reflexes normal and symmetric. Pt reports positive for blurred bilateral vision and loss of peripheral left eye, PERRLA.  Denies auditory abnormalities. Pt presenting appropriate quality, quantity and organization of sentences. Pt reports headaches described as sharp pain, rated a 6 over the right head incision. OTHER: Pt complains of fatigue, weakness and loss of sleep. Decadron? Yes.  Taking 2mg  once a day.  Mouth moist with yellow/brown exudate, pt was drinking coffee.  BP 148/84 mmHg  Pulse 93  Temp(Src) 97.8 F (36.6 C) (Oral)  Resp 20  Wt 253 lb 14.4 oz (115.168 kg)  SpO2 97% Wt Readings from Last 3 Encounters:  03/15/15 253 lb 14.4 oz (115.168 kg)  03/10/15 252 lb (114.306 kg)  03/03/15 253 lb 9.6 oz (115.032 kg)

## 2015-03-15 NOTE — Progress Notes (Signed)
Radiation Oncology         [phone] ________________________________  Name: Jake [name] MRN: [id number]  Date: 03/15/2015  DOB: March 11, 1956  Follow-Up Visit Note  Outpatient  CC: Jake Haber, MD  Jake Haber, MD  Diagnosis and Prior Radiotherapy:    ICD-9-CM ICD-10-CM   1. 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    PAIN:  He is currently in no pain. Reports headaches twice a week  NEURO:  He continues to have loss of peripheral vision in the left eye. He also reports bilateral blurred vision. He reports that his headaches, when present, are sharp and concentrated in the right occipital region. OTHER:  Pt complains of fatigue, weakness and loss of sleep.  Decadron? Yes. Taking 2mg  once a day, and continuing to taper.  Exercising on bike Glass blower/designer)  He underwent an MRI of his brain on 03/10/15. This was discussed at tumor board. This showed interval improvement in the intensity of the enhancement around the surgical cavity although some of the enhancement, while less enhanced and less defined, has enlarged compared to his previous MRI in April. This could be consistent with treatment changes versus his current tumor. He has been receiving Bevacizumab every two weeks since May 6th.   He reports he has had more energy lately and began exercising again on his exercise bike. They completed their package for a consult with Jake [name] yesterday. His wife was curious about the trial involving the polio virus being injected to the lesion.   He is scheduled to see Dr. Burr [name] this Friday, 7/15.    ALLERGIES:  has no allergies on file.  Meds: Current  Outpatient Prescriptions  Medication Sig Dispense Refill  . bevacizumab in sodium chloride 0.9 % 100 mL Inject into the vein every 14 (fourteen) days.    . clonazePAM (KLONOPIN) 1 MG tablet Take 1-2 tablets (1-2 mg total) by mouth at bedtime. (Patient not taking: Reported on 03/15/2015) 30 tablet 3  . dexamethasone (DECADRON) 4 MG tablet 1 tab daily (Patient taking differently: 2 mg daily. Take 2mg  alternate with 4mg  daily.) 30 tablet 1  . docusate sodium (COLACE) 100 MG capsule Take 100 mg by mouth 2 (two) times daily.    Marland Kitchen HYDROcodone-acetaminophen (NORCO/VICODIN) 5-325 MG per tablet Take 1 tablet by mouth every 6 (six) hours as needed for moderate pain. 30 tablet 0  . levothyroxine (SYNTHROID, LEVOTHROID) 50 MCG tablet Take 1 tablet (50 mcg total) by mouth daily before breakfast. 90 tablet 3  . lisinopril (PRINIVIL,ZESTRIL) 40 MG tablet Take 1 tablet (40 mg total) by mouth daily. 90 tablet 3  . LORazepam (ATIVAN) 0.5 MG tablet Take 1-2 tablets every 8 hrs prn anxiety or nausea. 60 tablet 0  . Multiple Vitamins-Minerals (MULTIVITAMIN WITH MINERALS) tablet Take 1 tablet by mouth daily.    Marland Kitchen NEXIUM 24HR 20 MG capsule   0  . ondansetron (ZOFRAN) 8 MG tablet Take 1 tablet (8 mg total) by mouth every 8 (eight) hours as needed for nausea or vomiting. (Patient not taking: Reported on 03/15/2015) 30 tablet 4  . Probiotic Product (PROBIOTIC DAILY PO) Take by mouth daily.     Marland Kitchen  prochlorperazine (COMPAZINE) 10 MG tablet Take 1 tablet (10 mg total) by mouth every 6 (six) hours as needed for nausea or vomiting. (Patient not taking: Reported on 03/15/2015) 60 tablet 1  . senna (SENOKOT) 8.6 MG tablet Take 1 tablet by mouth daily.    . verapamil (COVERA HS) 180 MG (CO) 24 hr tablet Take 1 tablet (180 mg total) by mouth at bedtime. 90 tablet 3   No current facility-administered medications for this encounter.    Physical Findings: The patient is in no acute distress. Patient is alert and oriented.  weight is  253 lb 14.4 oz (115.168 kg). His oral temperature is 97.8 F (36.6 C). His blood pressure is 148/84 and his pulse is 93. His respiration is 20 and oxygen saturation is 97%. .   General NAD,  HEENT no thrush,  Visual deficit in left upper and lower quadrants for both eyes, coordination intact, strength intact, gait independent, Scalp skin intact and smooth.  Lungs CTAB,  Heart RRR, Abdomen soft, nontender. Psychiatric: judgment intact Skin: Some pitting edema in his ankles bilaterally  Lab Findings: Lab Results  Component Value Date   WBC 8.1 03/03/2015   HGB 13.4 03/03/2015   HCT 40.1 03/03/2015   MCV 89.7 03/03/2015   PLT 139* 03/03/2015    Radiographic Findings: [name] JO Contrast  03/10/2015   CLINICAL DATA:  Followup GBM. Restaging. Surgical resection 07/07/2014. Radiation ending 09/15/2014  EXAM: MRI HEAD WITHOUT AND WITH CONTRAST  TECHNIQUE: Multiplanar, multiecho pulse sequences of the brain and surrounding structures were obtained without and with intravenous contrast.  CONTRAST:  26mL MULTIHANCE GADOBENATE DIMEGLUMINE 529 MG/ML IV SOLN  COMPARISON:  MRI 12/13/2014 and 10/14/2014  FINDINGS: Right occipital parietal craniotomy for tumor resection. Surgical resection cavity in the right occipital parietal lobe shows improvement in peripheral enhancement since the most recent MRI. The wall of the cavity shows no significant abnormal enhancement on today's study. The enhancing nodule anterior to the cavity has resolved. Inferior and anterior to the surgical cavity is an area of ill-defined enhancement which has enlarged in the interval. Previously there was a 16 x 17 mm enhancing nodule in this area, this area is now more ill-defined and larger measuring approximately 2 x 3 cm. Enhancement is less intense and less masslike on today's study. This will need to be followed closely to evaluate for recurrent tumor versus radiation change. No other new areas of abnormal enhancement  identified.  Mild atrophy. Negative for hydrocephalus. White matter hyperintensity in the occipital parietal lobes bilaterally shows progression is compatible with radiation change. Ovoid hyperintensity in the left frontal operculum is unchanged from prior studies and may be due to prior ischemia. Negative for acute infarct. No shift of the midline structures  IMPRESSION: Interval improvement in enhancement around the surgical cavity in the right occipital lobe. The nodular area of enhancement anterior and inferior to the surgical cavity has enlarged in the interval however the enhancement is ill-defined and less intense than that seen previously. This could be recurrent tumor or radiation change. Close MRI follow-up is suggested.   Electronically Signed   By: Franchot Gallo M.D.   On: 03/10/2015 15:25    Impression/Plan: MRI changes consistent with progression vs treatment changes. I favor treatment changes since he is doing better clinically.  We will schedule for a routine follow up in 2 months after brain MRI.  I encouraged him to see what Jake [name] has to offer for trials, if needed, in the future.  This document serves as a record of services personally performed by Eppie Gibson, MD. It was created on her behalf by Arlyce Harman, a trained medical scribe. The creation of this record is based on the scribe's personal observations and the provider's statements to them. This document has been checked and approved by the attending provider.  _____________________________________   Eppie Gibson, MD

## 2015-03-17 ENCOUNTER — Ambulatory Visit (HOSPITAL_BASED_OUTPATIENT_CLINIC_OR_DEPARTMENT_OTHER): Payer: BLUE CROSS/BLUE SHIELD

## 2015-03-17 ENCOUNTER — Encounter: Payer: Self-pay | Admitting: General Practice

## 2015-03-17 ENCOUNTER — Encounter: Payer: Self-pay | Admitting: Hematology

## 2015-03-17 ENCOUNTER — Other Ambulatory Visit: Payer: Self-pay | Admitting: *Deleted

## 2015-03-17 ENCOUNTER — Telehealth: Payer: Self-pay | Admitting: Hematology

## 2015-03-17 ENCOUNTER — Ambulatory Visit (HOSPITAL_BASED_OUTPATIENT_CLINIC_OR_DEPARTMENT_OTHER): Payer: BLUE CROSS/BLUE SHIELD | Admitting: Hematology

## 2015-03-17 ENCOUNTER — Other Ambulatory Visit (HOSPITAL_BASED_OUTPATIENT_CLINIC_OR_DEPARTMENT_OTHER): Payer: BLUE CROSS/BLUE SHIELD

## 2015-03-17 VITALS — BP 138/76 | HR 80 | Temp 97.9°F | Resp 18 | Ht 71.0 in | Wt 251.8 lb

## 2015-03-17 VITALS — BP 132/72 | HR 79 | Temp 97.7°F | Resp 18

## 2015-03-17 DIAGNOSIS — C714 Malignant neoplasm of occipital lobe: Secondary | ICD-10-CM

## 2015-03-17 DIAGNOSIS — C713 Malignant neoplasm of parietal lobe: Secondary | ICD-10-CM

## 2015-03-17 DIAGNOSIS — R630 Anorexia: Secondary | ICD-10-CM | POA: Diagnosis not present

## 2015-03-17 DIAGNOSIS — C719 Malignant neoplasm of brain, unspecified: Secondary | ICD-10-CM

## 2015-03-17 DIAGNOSIS — R5383 Other fatigue: Secondary | ICD-10-CM | POA: Diagnosis not present

## 2015-03-17 DIAGNOSIS — Z5112 Encounter for antineoplastic immunotherapy: Secondary | ICD-10-CM

## 2015-03-17 DIAGNOSIS — R110 Nausea: Secondary | ICD-10-CM

## 2015-03-17 LAB — CBC WITH DIFFERENTIAL/PLATELET
BASO%: 0.3 % (ref 0.0–2.0)
Basophils Absolute: 0 10*3/uL (ref 0.0–0.1)
EOS%: 0.8 % (ref 0.0–7.0)
Eosinophils Absolute: 0.1 10*3/uL (ref 0.0–0.5)
HCT: 38.3 % — ABNORMAL LOW (ref 38.4–49.9)
HEMOGLOBIN: 13 g/dL (ref 13.0–17.1)
LYMPH%: 15.1 % (ref 14.0–49.0)
MCH: 30.4 pg (ref 27.2–33.4)
MCHC: 33.9 g/dL (ref 32.0–36.0)
MCV: 89.7 fL (ref 79.3–98.0)
MONO#: 0.6 10*3/uL (ref 0.1–0.9)
MONO%: 7 % (ref 0.0–14.0)
NEUT%: 76.8 % — ABNORMAL HIGH (ref 39.0–75.0)
NEUTROS ABS: 6.1 10*3/uL (ref 1.5–6.5)
NRBC: 1 % — AB (ref 0–0)
Platelets: 174 10*3/uL (ref 140–400)
RBC: 4.27 10*6/uL (ref 4.20–5.82)
RDW: 18.3 % — ABNORMAL HIGH (ref 11.0–14.6)
WBC: 8 10*3/uL (ref 4.0–10.3)
lymph#: 1.2 10*3/uL (ref 0.9–3.3)

## 2015-03-17 LAB — COMPREHENSIVE METABOLIC PANEL (CC13)
ALT: 32 U/L (ref 0–55)
ANION GAP: 8 meq/L (ref 3–11)
AST: 25 U/L (ref 5–34)
Albumin: 3.2 g/dL — ABNORMAL LOW (ref 3.5–5.0)
Alkaline Phosphatase: 68 U/L (ref 40–150)
BUN: 16.2 mg/dL (ref 7.0–26.0)
CALCIUM: 9.7 mg/dL (ref 8.4–10.4)
CHLORIDE: 106 meq/L (ref 98–109)
CO2: 23 meq/L (ref 22–29)
CREATININE: 0.8 mg/dL (ref 0.7–1.3)
EGFR: >90 mL/min/{1.73_m2} (ref >90)
Glucose: 93 mg/dl (ref 70–140)
POTASSIUM: 4 meq/L (ref 3.5–5.1)
SODIUM: 137 meq/L (ref 136–145)
Total Bilirubin: 0.66 mg/dL (ref 0.20–1.20)
Total Protein: 6.2 g/dL — ABNORMAL LOW (ref 6.4–8.3)

## 2015-03-17 LAB — TECHNOLOGIST REVIEW

## 2015-03-17 MED ORDER — SODIUM CHLORIDE 0.9 % IV SOLN
1100.0000 mg | Freq: Once | INTRAVENOUS | Status: AC
  Administered 2015-03-17: 1100 mg via INTRAVENOUS
  Filled 2015-03-17: qty 44

## 2015-03-17 MED ORDER — SODIUM CHLORIDE 0.9 % IV SOLN
Freq: Once | INTRAVENOUS | Status: AC
  Administered 2015-03-17: 12:00:00 via INTRAVENOUS

## 2015-03-17 MED ORDER — LORAZEPAM 0.5 MG PO TABS: ORAL_TABLET | ORAL | Status: DC

## 2015-03-17 NOTE — Progress Notes (Signed)
[name] FOLLOW UP NOTE  Patient Care Team: [name], MD as PCP - General (Family Medicine) Kary Kos, MD as Consulting Physician (Neurosurgery) Eppie Gibson, MD as Attending Physician (Radiation Oncology) Truitt Merle, MD as Consulting Physician (Hematology)     Glioblastoma of occipital lobe   07/02/2014 Imaging Brain MRI: Two adjacent enhancing mass lesions in the right parietal lobe with central necrosis and extensive surrounding edema and mass effect.  7 mm midline shift to the left.  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   CT CAP(-)   07/07/2014 Surgery Subtotal right parietal tumors resection    08/03/2014 Concurrent Chemotherapy Concurrent radiation with Temodar 75 mg/m daily, completed on 09/15/2014.    09/29/2014 - 12/28/2014 Chemotherapy Adjuvant Temodar, 150-200mg /m2 for 5 days, every 28 day cycle, s/p 3 cycles, stopped due to disease progression.   10/14/2014 Imaging Two discrete foci of enhancement in the RIGHT occipital lobe surround the larger resection cavity, and are suspected to represent foci of residual tumor/GBM displaying interval growt   12/13/2014 Imaging Increased size/ enhancement of 2 lesions adjacent to the right occipital resection cavity, concerning for growth of residual tumor.   01/06/2015 -  Chemotherapy Bevacizumab 10mg /kg every 2 weeks    GBM (glioblastoma multiforme)   07/22/2014 Initial Diagnosis GBM (glioblastoma multiforme)    CURRENT THERAPY: Bevacizumab 10 mg/kg every 2 weeks, started on 01/06/2059 dexa 4mg  daily until 6/10, changed to 4mg  and 2mg  daily alternatively, then 2mg  daily from 7/1  CHIEF COMPLAINTS: follow up GBM   HISTORY OF INITIAL PRESENTATION;   He initially presented was headaches and sinus congestion in mid October 2015. He was seen by his primary care physician and was treated for sinus infection. He subsequently developed left sided visual deficit, left arm and leg weakness and unsteady gait. He  was sent to emergency room by his primary care physician on 07/02/2014. CT  And MRI of brain reviewed to add adjacent enhancing mass lesions in the right parietal lobe with central necrosis and extensive surrounding edema and mass effect. He was admitted and underwent right craniotomy  For resection of the 2 brain masses by Dr. Lonell Grandchild. Postsurgical brain MRI revealed a 1.3 cm area of enhancement at the anterior margin of surgical resection cavity, likely surgical change. His case was reviewed in the tumor board and it was felt he had subtotal resection.  INTERVAL HISTROY: Jake [name] returns for follow-up. He is clinically doing well overall. He has been on dexamethasone 2 mg daily for the past 2 weeks, he only had one episodes of moderate headache which required pain medication. He did have moderate fatigue for a few days after fasting, but otherwise tolerated treatment well. He is more active lately, right station bike twice a day, and feels that his lack are getting stronger. No fever or chills, no bleeding, his appetite is fair, he eats better than before. His vision deficits is stable, no other new neuro symptoms.   MEDICAL HISTORY:  Past Medical History  Diagnosis Date  . Thyroid disease   . Hypertension   . Allergy   . Arthritis   .    Marland Kitchen Kidney stone     SURGICAL HISTORY: Past Surgical History  Procedure Laterality Date  . Knee arthroscopy w/ meniscal repair    . Treatment fistula anal    . Craniotomy N/A 07/07/2014    Procedure: CRANIOTOMY TUMOR EXCISION/CURVE;  Surgeon: Elaina Hoops, MD;  Location: 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 Marital Status: Married    Spouse Name: N/A    Number of Children: 5 children, age of 72-21  . Years of Education: N/A   Occupational History  . 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    Social History Main Topics  . Smoking status: Current Every  Day Smoker -- 1.00 packs/day for 45 years    Types: Cigarettes  . Smokeless tobacco: Never Used  . Alcohol Use: Yes     Comment: 2-3 beers per/day   . Drug Use: No  . Sexual Activity: Not on file     FAMILY HISTORY: Family History  Problem Relation Age of Onset  . Hypertension Mother   . Diabetes Father   . Glaucoma Father   . Diabetes Brother   . Diabetes Brother   . Colon cancer Neg Hx   Paternal ancle had prostate cancer. No other family history of malignancy.  ALLERGIES:  has No Known Allergies.   MEDICATIONS:  Current Outpatient Prescriptions  Medication Sig Dispense Refill  . bevacizumab in sodium chloride 0.9 % 100 mL Inject into the vein every 14 (fourteen) days.    Marland Kitchen dexamethasone (DECADRON) 4 MG tablet 1 tab daily (Patient taking differently: 2 mg daily. Take 2mg  daily.) 30 tablet 1  . docusate sodium (COLACE) 100 MG capsule Take 100 mg by mouth 2 (two) times daily.    Marland Kitchen HYDROcodone-acetaminophen (NORCO/VICODIN) 5-325 MG per tablet Take 1 tablet by mouth every 6 (six) hours as needed for moderate pain. 30 tablet 0  . levothyroxine (SYNTHROID, LEVOTHROID) 50 MCG tablet Take 1 tablet (50 mcg total) by mouth daily before breakfast. 90 tablet 3  . lisinopril (PRINIVIL,ZESTRIL) 40 MG tablet Take 1 tablet (40 mg total) by mouth daily. 90 tablet 3  . Multiple Vitamins-Minerals (MULTIVITAMIN WITH MINERALS) tablet Take 1 tablet by mouth daily.    Marland Kitchen NEXIUM 24HR 20 MG capsule   0  . ondansetron (ZOFRAN) 8 MG tablet Take 1 tablet (8 mg total) by mouth every 8 (eight) hours as needed for nausea or vomiting. 30 tablet 4  . Probiotic Product (PROBIOTIC DAILY PO) Take by mouth daily.     . prochlorperazine (COMPAZINE) 10 MG tablet Take 1 tablet (10 mg total) by mouth every 6 (six) hours as needed for nausea or vomiting. 60 tablet 1  . senna (SENOKOT) 8.6 MG tablet Take 1 tablet by mouth daily.    . verapamil (COVERA HS) 180 MG (CO) 24 hr tablet Take 1 tablet (180 mg total) by mouth at  bedtime. 90 tablet 3  . clonazePAM (KLONOPIN) 1 MG tablet Take 1-2 tablets (1-2 mg total) by mouth at bedtime. (Patient not taking: Reported on 03/17/2015) 30 tablet 3  . LORazepam (ATIVAN) 0.5 MG tablet Take 1-2 tablets every 8 hrs prn anxiety or nausea, and sleep. 60 tablet 0   No current facility-administered medications for this visit.    REVIEW OF SYSTEMS:   Constitutional: Denies fevers, chills or abnormal night sweats Eyes: Denies blurriness of vision, double vision or watery eyes Ears, nose, mouth, throat, and face: Denies mucositis or sore throat Respiratory: Denies cough, dyspnea or wheezes Cardiovascular: Denies palpitation, chest discomfort or lower extremity swelling Gastrointestinal:  Denies nausea, heartburn or change in bowel habits Skin: Denies abnormal skin rashes Lymphatics: Denies new lymphadenopathy or easy bruising Neurological:positive for mild headaches, and left side vision deficits. His left-sided  weakness has nearly resolved. Denies numbness, tingling or new weaknesses Behavioral/Psych: Mood is stable, no new changes  All other systems were reviewed with the patient and are negative.  PHYSICAL EXAMINATION: ECOG PERFORMANCE STATUS: 1 - Symptomatic but completely ambulatory  KPS: 80%  Filed Vitals:   03/17/15 1119  BP: 138/76  Pulse: 80  Temp: 97.9 F (36.6 C)  Resp: 18   Filed Weights   03/17/15 1119  Weight: 251 lb 12.8 oz (114.216 kg)    GENERAL:alert, no distress and comfortable SKIN: skin color, texture, turgor are normal, no rashes or significant lesions HEAD: right-sided posterior craniotomy surgical wound is healing well, no surrounding edema, skin redness or discharge EYES: normal, conjunctiva are pink and non-injected, sclera clear OROPHARYNX:no exudate, no erythema and lips, buccal mucosa, and tongue normal  NECK: supple, thyroid normal size, non-tender, without nodularity LYMPH:  no palpable lymphadenopathy in the cervical, axillary or  inguinal LUNGS: clear to auscultation and percussion with normal breathing effort HEART: regular rate & rhythm and no murmurs and no lower extremity edema ABDOMEN:abdomen soft, non-tender and normal bowel sounds Musculoskeletal:no cyanosis of digits and no clubbing  PSYCH: alert & oriented x 3 with fluent speech NEURO: cranial nerve 2-12 are intact. Left side vision deficits is present in the left upper and mid quadrant. Motor and sensitivity are normal and symmetric on all extremities. Speech is fluent, coordination is grossly intact. Ambulates independently and Lutricia Feil sign negative. EXT: Bilateral pitting edema in ankles, better than before   LABORATORY DATA:  CBC Latest Ref Rng 03/17/2015 03/03/2015 02/17/2015  WBC 4.0 - 10.3 10e3/uL 8.0 8.1 9.6  Hemoglobin 13.0 - 17.1 g/dL 13.0 13.4 14.1  Hematocrit 38.4 - 49.9 % 38.3(L) 40.1 42.4  Platelets 140 - 400 10e3/uL 174 139(L) 150    CMP Latest Ref Rng 03/17/2015 02/17/2015 01/20/2015  Glucose 70 - 140 mg/dl 93 98 104  BUN 7.0 - 26.0 mg/dL 16.2 22.7 23.1  Creatinine 0.7 - 1.3 mg/dL 0.8 0.8 0.8  Sodium 136 - 145 mEq/L 137 138 139  Potassium 3.5 - 5.1 mEq/L 4.0 4.1 3.9  Chloride 96 - 112 mEq/L - - -  CO2 22 - 29 mEq/L 23 25 22   Calcium 8.4 - 10.4 mg/dL 9.7 9.5 9.2  Total Protein 6.4 - 8.3 g/dL 6.2(L) 5.9(L) 6.2(L)  Total Bilirubin 0.20 - 1.20 mg/dL 0.66 0.57 0.48  Alkaline Phos 40 - 150 U/L 68 57 67  AST 5 - 34 U/L 25 22 20   ALT 0 - 55 U/L 32 32 34     Surgical path 07/07/2014  Diagnosis 1. Brain, biopsy - GLIOBLASTOMA MULTIFORME, WHO GRADE IV/IV. - SEE ONCOLOGY TABLE BELOW. 2. Brain, for tumor resection, higher right parietal - GLIOBLASTOMA MULTIFORME, WHO GRADE IV/IV. - SEE ONCOLOGY TABLE BELOW. 3. Brain, for tumor resection, lower right occipital - GLIOBLASTOMA MULTIFORME, WHO GRADE IV/IV   Microscopic Comment 1. -3. ONCOLOGY TABLE - BRAIN AND SPINAL CORD 1. Procedure: Resection x2 2. Tumor site, including laterality: Right  parietal and right occipital 3. Maximum tumor size (cm): At least 1.8 cm 4. Histologic type: Glioblastoma multiforme 5. Grade: WHO Grade IV/IV 6. Margins (if applicable): Can not be assessed 7. Ancillary studies: Can be performed upon clinician request.  RADIOGRAPHIC STUDIES: I have personally reviewed the radiological images as listed and agreed with the findings in the report.  Mr [name] Wo Contrast 07/08/2014    IMPRESSION:  1. Postoperative changes from interval right occipital craniotomy with resection of right parieto-occipital  mass lesions.  2. 1.3 x 0.5 x 0.5 cm area of enhancement at the anterior margin of the more superior resection cavity in the right parieto-occipital region. It is unclear whether this reflects residual tumor or possibly normal enhancement of the choroid plexus in the adjacent compressed occipital horn of the right lateral ventricle. Attention on follow-up recommended.  3. Fairly linear enhancement about the more inferior resection cavity without definite evidence of residual tumor.  4. Small amount of associated postoperative infarct at the medial aspect of the more superior resection site.  5. Stable 6 mm focus of restricted diffusion in the left frontal lobe, compatible with an acute or subacute infarct.  6. Slightly improved vasogenic edema within the right parieto-occipital region with persistent 7 mm of right-to-left midline shift.     Electronically Signed   By: Jeannine Boga M.D.   On: 07/08/2014 23:45   10/14/2014 Mr Brain W BL Contrast  IMPRESSION: Two discrete foci of enhancement in the RIGHT occipital lobe surround the larger resection cavity, and are suspected to represent foci of residual tumor/GBM displaying interval growth. Recommend continued short-term follow-up..     12/13/2014 Mr Brain W Wo Contrast  IMPRESSION: Increased size/ enhancement of 2 lesions adjacent to the right occipital resection cavity, concerning for growth of  residual tumor. Mildly increased surrounding T2 hyperintensity.  ASSESSMENT & PLAN:  Mr. Sellin is a 59 year old gentleman with newly diagnosed right parietal GBM, status post subtotal resection. He has recovered very well, except mild headaches and left-sided vision deficits. He has finished concurrent chemoradiation or week ago.  1. GBM, s/p subtotal resection, and concurrent chemoRT completed on 09/15/2014, progressed on adjuvant Temodar, now on second line Avastin -I reviewed his restaging brain MRI from 4.12.16,  previous 2 small foci of enhancement around the surgical site has significantly increased on the repeated ischemic. This was reviewed in our brain tumor board this morning, and we agreed this is tumor for progression. Unfortunately, due to the position of this 2 lesions, subtotal resection are unlikely and surgery is not offered -We discussed systemic therapy options. I recommend second line Avastin. He progressed through Temodar, I'll stop Temodar. -His recent restaging brain MRI showed decreased edema, slightly increased the enhancement around the surgical cavity but less density. Mixed response or pseudoprogression from radiation were discussed with them. Dr. Isidore Moos has suggested close follow-up with a repeat MRI in 6-8 weeks. -We also discussed clinical trial options in another institution, if he would like to pursue. He is now interested in the vaccine trial in [name] , I have referred him - he is tolerating Avastin well, we'll continue -We'll continue tapering his dexamethasone. -cont Nexium daily when he is on steroids, vitamin the vitamin D and calcium, and regular exercise.  2. Anorexia, fatigue, nausea -Improved -Follow up with dietitian. Use Zofran as needed.  Plan: -The 6th cycle Avast today and continue every 2 weeks  - will contact [name] neuro Oncology program  for clinical trial -Decrease his dexamethasone to 2 mg daily, 2 days on, 1 day off for 1 week, if he does  well, then 2 mg every other day until I see him in 2 weeks   All questions were answered. The patient knows to call the clinic with any problems, questions or concerns.  I spent 20 minutes counseling the patient face to face. The total time spent in the appointment was 25 minutes and more than 50% was on counseling.     Truitt Merle, MD 03/17/2015  11:55 AM

## 2015-03-17 NOTE — Telephone Encounter (Signed)
Faxed Records to [facility]

## 2015-03-17 NOTE — Progress Notes (Signed)
Spiritual Care Note  Met briefly with Jake [name] and wife [name] to offer emotional support and encouragement.  Jake [name] works hard at maintaining a positive attitude and also dearly wants to make progress in treatment.  He finds hope in the possibility of participating in a clinical trial.  Couple plans to attend brain group again next month, finding meaningful support in that community.  [place], [place] Pager [phone] VM [phone]

## 2015-03-17 NOTE — Patient Instructions (Signed)
[facility] Discharge Instructions for Patients Receiving Chemotherapy  Today you received the following chemotherapy agents Avastin To help prevent nausea and vomiting after your treatment, we encourage you to take your nausea medication as prescribed.   If you develop nausea and vomiting that is not controlled by your nausea medication, call the clinic.   BELOW ARE SYMPTOMS THAT SHOULD BE REPORTED IMMEDIATELY:  *FEVER GREATER THAN 100.5 F  *CHILLS WITH OR WITHOUT FEVER  NAUSEA AND VOMITING THAT IS NOT CONTROLLED WITH YOUR NAUSEA MEDICATION  *UNUSUAL SHORTNESS OF BREATH  *UNUSUAL BRUISING OR BLEEDING  TENDERNESS IN MOUTH AND THROAT WITH OR WITHOUT PRESENCE OF ULCERS  *URINARY PROBLEMS  *BOWEL PROBLEMS  UNUSUAL RASH Items with * indicate a potential emergency and should be followed up as soon as possible.  Feel free to call the clinic you have any questions or concerns. The clinic phone number is [phone].  Please show the CHEMO ALERT CARD at check-in to the Emergency Department and triage nurse.   

## 2015-03-17 NOTE — Telephone Encounter (Signed)
lvm for pt regarding to 7.29

## 2015-03-30 ENCOUNTER — Other Ambulatory Visit: Payer: Self-pay | Admitting: *Deleted

## 2015-03-30 DIAGNOSIS — C714 Malignant neoplasm of occipital lobe: Secondary | ICD-10-CM

## 2015-03-31 ENCOUNTER — Other Ambulatory Visit (HOSPITAL_BASED_OUTPATIENT_CLINIC_OR_DEPARTMENT_OTHER): Payer: BLUE CROSS/BLUE SHIELD

## 2015-03-31 ENCOUNTER — Ambulatory Visit (HOSPITAL_BASED_OUTPATIENT_CLINIC_OR_DEPARTMENT_OTHER): Payer: BLUE CROSS/BLUE SHIELD

## 2015-03-31 ENCOUNTER — Encounter: Payer: Self-pay | Admitting: Hematology

## 2015-03-31 ENCOUNTER — Telehealth: Payer: Self-pay | Admitting: Hematology

## 2015-03-31 ENCOUNTER — Ambulatory Visit (HOSPITAL_BASED_OUTPATIENT_CLINIC_OR_DEPARTMENT_OTHER): Payer: BLUE CROSS/BLUE SHIELD | Admitting: Hematology

## 2015-03-31 ENCOUNTER — Encounter: Payer: Self-pay | Admitting: *Deleted

## 2015-03-31 VITALS — BP 132/89 | HR 87 | Temp 97.9°F | Resp 18 | Ht 71.0 in | Wt 248.7 lb

## 2015-03-31 DIAGNOSIS — C713 Malignant neoplasm of parietal lobe: Secondary | ICD-10-CM | POA: Diagnosis not present

## 2015-03-31 DIAGNOSIS — R630 Anorexia: Secondary | ICD-10-CM

## 2015-03-31 DIAGNOSIS — R110 Nausea: Secondary | ICD-10-CM

## 2015-03-31 DIAGNOSIS — C714 Malignant neoplasm of occipital lobe: Secondary | ICD-10-CM

## 2015-03-31 DIAGNOSIS — C719 Malignant neoplasm of brain, unspecified: Secondary | ICD-10-CM

## 2015-03-31 DIAGNOSIS — I10 Essential (primary) hypertension: Secondary | ICD-10-CM

## 2015-03-31 DIAGNOSIS — Z5112 Encounter for antineoplastic immunotherapy: Secondary | ICD-10-CM

## 2015-03-31 DIAGNOSIS — R5383 Other fatigue: Secondary | ICD-10-CM | POA: Diagnosis not present

## 2015-03-31 DIAGNOSIS — E039 Hypothyroidism, unspecified: Secondary | ICD-10-CM

## 2015-03-31 LAB — CBC WITH DIFFERENTIAL/PLATELET
BASO%: 0.3 % (ref 0.0–2.0)
Basophils Absolute: 0 10*3/uL (ref 0.0–0.1)
EOS ABS: 0.1 10*3/uL (ref 0.0–0.5)
EOS%: 1.2 % (ref 0.0–7.0)
HCT: 37.9 % — ABNORMAL LOW (ref 38.4–49.9)
HGB: 12.8 g/dL — ABNORMAL LOW (ref 13.0–17.1)
LYMPH#: 1.6 10*3/uL (ref 0.9–3.3)
LYMPH%: 21.4 % (ref 14.0–49.0)
MCH: 30.4 pg (ref 27.2–33.4)
MCHC: 33.8 g/dL (ref 32.0–36.0)
MCV: 90 fL (ref 79.3–98.0)
MONO#: 0.8 10*3/uL (ref 0.1–0.9)
MONO%: 10.8 % (ref 0.0–14.0)
NEUT%: 66.3 % (ref 39.0–75.0)
NEUTROS ABS: 5.1 10*3/uL (ref 1.5–6.5)
Platelets: 168 10*3/uL (ref 140–400)
RBC: 4.21 10*6/uL (ref 4.20–5.82)
RDW: 17.9 % — ABNORMAL HIGH (ref 11.0–14.6)
WBC: 7.7 10*3/uL (ref 4.0–10.3)
nRBC: 0 % (ref 0–0)

## 2015-03-31 LAB — UA PROTEIN, DIPSTICK - CHCC: PROTEIN: NEGATIVE mg/dL

## 2015-03-31 MED ORDER — SODIUM CHLORIDE 0.9 % IV SOLN
Freq: Once | INTRAVENOUS | Status: AC
  Administered 2015-03-31: 14:00:00 via INTRAVENOUS

## 2015-03-31 MED ORDER — SODIUM CHLORIDE 0.9 % IV SOLN
1100.0000 mg | Freq: Once | INTRAVENOUS | Status: AC
  Administered 2015-03-31: 1100 mg via INTRAVENOUS
  Filled 2015-03-31: qty 44

## 2015-03-31 NOTE — Telephone Encounter (Signed)
Added appt ...the patient will get sched from chemo

## 2015-03-31 NOTE — Patient Instructions (Signed)
[facility] Discharge Instructions for Patients Receiving Chemotherapy  Today you received the following chemotherapy agents Avastin To help prevent nausea and vomiting after your treatment, we encourage you to take your nausea medication as prescribed.   If you develop nausea and vomiting that is not controlled by your nausea medication, call the clinic.   BELOW ARE SYMPTOMS THAT SHOULD BE REPORTED IMMEDIATELY:  *FEVER GREATER THAN 100.5 F  *CHILLS WITH OR WITHOUT FEVER  NAUSEA AND VOMITING THAT IS NOT CONTROLLED WITH YOUR NAUSEA MEDICATION  *UNUSUAL SHORTNESS OF BREATH  *UNUSUAL BRUISING OR BLEEDING  TENDERNESS IN MOUTH AND THROAT WITH OR WITHOUT PRESENCE OF ULCERS  *URINARY PROBLEMS  *BOWEL PROBLEMS  UNUSUAL RASH Items with * indicate a potential emergency and should be followed up as soon as possible.  Feel free to call the clinic you have any questions or concerns. The clinic phone number is [phone].  Please show the CHEMO ALERT CARD at check-in to the Emergency Department and triage nurse.   

## 2015-03-31 NOTE — Progress Notes (Signed)
[place] Work  Clinical Social Work checked in with pt and wife as he had wanted to complete ADRs. CSW provided pt with packet and then he though he already had completed ADR paperwork. CSW reviewed chart and found no record of ADRs on file. CSW reviewed packet and pt plans to look through and complete packet with CSW on 04/14/15. Pt also had questions about billing and CSW referred him to call [facility] and talk with Darlena. They are excited about their referral to [name] next week. CSW to continue to follow and assist.   Clinical Social Work interventions: ADR education Resource education and referral  Jake [name], [place] Worker [place]  [place] Phone: [phone] Fax: [phone]

## 2015-03-31 NOTE — Progress Notes (Signed)
[place] FOLLOW UP NOTE  Patient Care Team: [name], MD as PCP - General (Family Medicine) Kary Kos, MD as Consulting Physician (Neurosurgery) Eppie Gibson, MD as Attending Physician (Radiation Oncology) Truitt Merle, MD as Consulting Physician (Hematology)     Glioblastoma of occipital lobe   07/02/2014 Imaging Brain MRI: Two adjacent enhancing mass lesions in the right parietal lobe with central necrosis and extensive surrounding edema and mass effect.  7 mm midline shift to the left.  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   CT CAP(-)   07/07/2014 Surgery Subtotal right parietal tumors resection    08/03/2014 Concurrent Chemotherapy Concurrent radiation with Temodar 75 mg/m daily, completed on 09/15/2014.    09/29/2014 - 12/28/2014 Chemotherapy Adjuvant Temodar, 150-200mg /m2 for 5 days, every 28 day cycle, s/p 3 cycles, stopped due to disease progression.   10/14/2014 Imaging Two discrete foci of enhancement in the RIGHT occipital lobe surround the larger resection cavity, and are suspected to represent foci of residual tumor/GBM displaying interval growt   12/13/2014 Imaging Increased size/ enhancement of 2 lesions adjacent to the right occipital resection cavity, concerning for growth of residual tumor.   01/06/2015 -  Chemotherapy Bevacizumab 10mg /kg every 2 weeks    GBM (glioblastoma multiforme)   07/22/2014 Initial Diagnosis GBM (glioblastoma multiforme)    CURRENT THERAPY: Bevacizumab 10 mg/kg every 2 weeks, started on 01/06/2059 dexa 4mg  daily until 6/10, changed to 4mg  and 2mg  daily alternatively, then 2mg  daily from 7/1, then 2mg  daily 2 days on, 1 day off from 7/25   CHIEF COMPLAINTS: follow up GBM   HISTORY OF INITIAL PRESENTATION;   He initially presented was headaches and sinus congestion in mid October 2015. He was seen by his primary care physician and was treated for sinus infection. He subsequently developed left sided visual deficit,  left arm and leg weakness and unsteady gait. He was sent to emergency room by his primary care physician on 07/02/2014. CT  And MRI of brain reviewed to add adjacent enhancing mass lesions in the right parietal lobe with central necrosis and extensive surrounding edema and mass effect. He was admitted and underwent right craniotomy  For resection of the 2 brain masses by Dr. Lonell Grandchild. Postsurgical brain MRI revealed a 1.3 cm area of enhancement at the anterior margin of surgical resection cavity, likely surgical change. His case was reviewed in the tumor board and it was felt he had subtotal resection.  INTERVAL HISTROY: Jake [name] returns for follow-up. He is doing well overall. He has decreased dexamethasone 22 mg daily, 2 days on, 1 day off, from this Monday. He is tolerating well. He had few episodes of headaches last week, resolved. He still has moderate fatigue, able to tolerate routine activities. He exercise on a stationary bike for 15 minutes, 3-4 times a week, appetite is moderate to low, but eats well. No fever or chills. He has a small ulcer at his left gum, mild pain, no issue with eating, No other new symptoms.    MEDICAL HISTORY:  Past Medical History  Diagnosis Date  . Thyroid disease   . Hypertension   . Allergy   . Arthritis   .    Marland Kitchen Kidney stone     SURGICAL HISTORY: Past Surgical History  Procedure Laterality Date  . Knee arthroscopy w/ meniscal repair    . Treatment fistula anal    . 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 Marital Status: Married    Spouse Name: N/A    Number of Children: 5 children, age of 9-21  . Years of Education: N/A   Occupational History  . 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    Social History Main Topics  . Smoking status: Current  Every Day Smoker -- 1.00 packs/day for 45 years    Types: Cigarettes  . Smokeless tobacco: Never Used  . Alcohol Use: Yes     Comment: 2-3 beers per/day   . Drug Use: No  . Sexual Activity: Not on file     FAMILY HISTORY: Family History  Problem Relation Age of Onset  . Hypertension Mother   . Diabetes Father   . Glaucoma Father   . Diabetes Brother   . Diabetes Brother   . Colon cancer Neg Hx   Paternal ancle had prostate cancer. No other family history of malignancy.  ALLERGIES:  has No Known Allergies.   MEDICATIONS:  Current Outpatient Prescriptions  Medication Sig Dispense Refill  . bevacizumab in sodium chloride 0.9 % 100 mL Inject into the vein every 14 (fourteen) days.    Marland Kitchen dexamethasone (DECADRON) 4 MG tablet 1 tab daily (Patient taking differently: 2 mg daily. Taking 2 mg daily x 2 then off one day-trying to wean.) 30 tablet 1  . docusate sodium (COLACE) 100 MG capsule Take 100 mg by mouth 2 (two) times daily.    Marland Kitchen HYDROcodone-acetaminophen (NORCO/VICODIN) 5-325 MG per tablet Take 1 tablet by mouth every 6 (six) hours as needed for moderate pain. 30 tablet 0  . levothyroxine (SYNTHROID, LEVOTHROID) 50 MCG tablet Take 1 tablet (50 mcg total) by mouth daily before breakfast. 90 tablet 3  . lisinopril (PRINIVIL,ZESTRIL) 40 MG tablet Take 1 tablet (40 mg total) by mouth daily. 90 tablet 3  . LORazepam (ATIVAN) 0.5 MG tablet Take 1-2 tablets every 8 hrs prn anxiety or nausea, and sleep. 60 tablet 0  . Multiple Vitamins-Minerals (MULTIVITAMIN WITH MINERALS) tablet Take 1 tablet by mouth daily.    Marland Kitchen NEXIUM 24HR 20 MG capsule   0  . ondansetron (ZOFRAN) 8 MG tablet Take 1 tablet (8 mg total) by mouth every 8 (eight) hours as needed for nausea or vomiting. 30 tablet 4  . Probiotic Product (PROBIOTIC DAILY PO) Take by mouth daily.     . prochlorperazine (COMPAZINE) 10 MG tablet Take 1 tablet (10 mg total) by mouth every 6 (six) hours as needed for nausea or vomiting. 60 tablet 1   . senna (SENOKOT) 8.6 MG tablet Take 1 tablet by mouth daily.    . verapamil (COVERA HS) 180 MG (CO) 24 hr tablet Take 1 tablet (180 mg total) by mouth at bedtime. 90 tablet 3  . clonazePAM (KLONOPIN) 1 MG tablet Take 1-2 tablets (1-2 mg total) by mouth at bedtime. (Patient not taking: Reported on 03/17/2015) 30 tablet 3   No current facility-administered medications for this visit.    REVIEW OF SYSTEMS:   Constitutional: Denies fevers, chills or abnormal night sweats Eyes: Denies blurriness of vision, double vision or watery eyes Ears, nose, mouth, throat, and face: Denies mucositis or sore throat Respiratory: Denies cough, dyspnea or wheezes Cardiovascular: Denies palpitation, chest discomfort or lower extremity swelling Gastrointestinal:  Denies nausea, heartburn or change in bowel habits Skin: Denies abnormal  skin rashes Lymphatics: Denies new lymphadenopathy or easy bruising Neurological:positive for mild headaches, and left side vision deficits. His left-sided weakness has nearly resolved. Denies numbness, tingling or new weaknesses Behavioral/Psych: Mood is stable, no new changes  All other systems were reviewed with the patient and are negative.  PHYSICAL EXAMINATION: ECOG PERFORMANCE STATUS: 1 - Symptomatic but completely ambulatory  KPS: 80%  Filed Vitals:   03/31/15 1300  BP: 132/89  Pulse: 87  Temp: 97.9 F (36.6 C)  Resp: 18   Filed Weights   03/31/15 1300  Weight: 248 lb 11.2 oz (112.81 kg)    GENERAL:alert, no distress and comfortable SKIN: skin color, texture, turgor are normal, no rashes or significant lesions HEAD: right-sided posterior craniotomy surgical wound is healing well, no surrounding edema, skin redness or discharge EYES: normal, conjunctiva are pink and non-injected, sclera clear OROPHARYNX:no exudate, no erythema and lips, buccal mucosa, and tongue normal  NECK: supple, thyroid normal size, non-tender, without nodularity LYMPH:  no palpable  lymphadenopathy in the cervical, axillary or inguinal LUNGS: clear to auscultation and percussion with normal breathing effort HEART: regular rate & rhythm and no murmurs and no lower extremity edema ABDOMEN:abdomen soft, non-tender and normal bowel sounds Musculoskeletal:no cyanosis of digits and no clubbing  PSYCH: alert & oriented x 3 with fluent speech NEURO: cranial nerve 2-12 are intact. Left side vision deficits is present in the left upper and mid quadrant. Motor and sensitivity are normal and symmetric on all extremities. Speech is fluent, coordination is grossly intact. Ambulates independently and Lutricia Feil sign negative. EXT: Bilateral pitting edema in ankles, better than before   LABORATORY DATA:  CBC Latest Ref Rng 03/31/2015 03/17/2015 03/03/2015  WBC 4.0 - 10.3 10e3/uL 7.7 8.0 8.1  Hemoglobin 13.0 - 17.1 g/dL 12.8(L) 13.0 13.4  Hematocrit 38.4 - 49.9 % 37.9(L) 38.3(L) 40.1  Platelets 140 - 400 10e3/uL 168 174 139(L)    CMP Latest Ref Rng 03/17/2015 02/17/2015 01/20/2015  Glucose 70 - 140 mg/dl 93 98 104  BUN 7.0 - 26.0 mg/dL 16.2 22.7 23.1  Creatinine 0.7 - 1.3 mg/dL 0.8 0.8 0.8  Sodium 136 - 145 mEq/L 137 138 139  Potassium 3.5 - 5.1 mEq/L 4.0 4.1 3.9  Chloride 96 - 112 mEq/L - - -  CO2 22 - 29 mEq/L 23 25 22   Calcium 8.4 - 10.4 mg/dL 9.7 9.5 9.2  Total Protein 6.4 - 8.3 g/dL 6.2(L) 5.9(L) 6.2(L)  Total Bilirubin 0.20 - 1.20 mg/dL 0.66 0.57 0.48  Alkaline Phos 40 - 150 U/L 68 57 67  AST 5 - 34 U/L 25 22 20   ALT 0 - 55 U/L 32 32 34     Surgical path 07/07/2014  Diagnosis 1. Brain, biopsy - GLIOBLASTOMA MULTIFORME, WHO GRADE IV/IV. - SEE ONCOLOGY TABLE BELOW. 2. Brain, for tumor resection, higher right parietal - GLIOBLASTOMA MULTIFORME, WHO GRADE IV/IV. - SEE ONCOLOGY TABLE BELOW. 3. Brain, for tumor resection, lower right occipital - GLIOBLASTOMA MULTIFORME, WHO GRADE IV/IV   Microscopic Comment 1. -3. ONCOLOGY TABLE - BRAIN AND SPINAL CORD 1. Procedure:  Resection x2 2. Tumor site, including laterality: Right parietal and right occipital 3. Maximum tumor size (cm): At least 1.8 cm 4. Histologic type: Glioblastoma multiforme 5. Grade: WHO Grade IV/IV 6. Margins (if applicable): Can not be assessed 7. Ancillary studies: Can be performed upon clinician request.  RADIOGRAPHIC STUDIES: I have personally reviewed the radiological images as listed and agreed with the findings in the report.  Mr [name] Wo  Contrast 07/08/2014    IMPRESSION:  1. Postoperative changes from interval right occipital craniotomy with resection of right parieto-occipital mass lesions.  2. 1.3 x 0.5 x 0.5 cm area of enhancement at the anterior margin of the more superior resection cavity in the right parieto-occipital region. It is unclear whether this reflects residual tumor or possibly normal enhancement of the choroid plexus in the adjacent compressed occipital horn of the right lateral ventricle. Attention on follow-up recommended.  3. Fairly linear enhancement about the more inferior resection cavity without definite evidence of residual tumor.  4. Small amount of associated postoperative infarct at the medial aspect of the more superior resection site.  5. Stable 6 mm focus of restricted diffusion in the left frontal lobe, compatible with an acute or subacute infarct.  6. Slightly improved vasogenic edema within the right parieto-occipital region with persistent 7 mm of right-to-left midline shift.     Electronically Signed   By: Jeannine Boga M.D.   On: 07/08/2014 23:45   10/14/2014 Mr Brain W JG Contrast  IMPRESSION: Two discrete foci of enhancement in the RIGHT occipital lobe surround the larger resection cavity, and are suspected to represent foci of residual tumor/GBM displaying interval growth. Recommend continued short-term follow-up..     12/13/2014 Mr Brain W Wo Contrast  IMPRESSION: Increased size/ enhancement of 2 lesions adjacent to the  right occipital resection cavity, concerning for growth of residual tumor. Mildly increased surrounding T2 hyperintensity.  ASSESSMENT & PLAN:  Mr. Yi is a 59 year old gentleman with newly diagnosed right parietal GBM, status post subtotal resection. He has recovered very well, except mild headaches and left-sided vision deficits. He has finished concurrent chemoradiation or week ago.  1. GBM, s/p subtotal resection, and concurrent chemoRT completed on 09/15/2014, progressed on adjuvant Temodar, now on second line Avastin -I reviewed his restaging brain MRI from 4.12.16,  previous 2 small foci of enhancement around the surgical site has significantly increased on the repeated ischemic. This was reviewed in our brain tumor board this morning, and we agreed this is tumor for progression. Unfortunately, due to the position of this 2 lesions, subtotal resection are unlikely and surgery is not offered -We discussed systemic therapy options. I recommend second line Avastin. He progressed through Temodar, I'll stop Temodar. -His recent restaging brain MRI showed decreased edema, slightly increased the enhancement around the surgical cavity but less density. Mixed response or pseudoprogression from radiation were discussed with them. Dr. Isidore Moos has suggested close follow-up with a repeat MRI in 6-8 weeks. -We also discussed clinical trial options in another institution, if he would like to pursue. He is now interested in the vaccine trial in [name] , and has an appointment with them next Friday  - he is tolerating Avastin well, we'll continue -We'll continue tapering his dexamethasone, change to dexa 2mg  every other day from next Monday  -cont Nexium daily when he is on steroids, vitamin the vitamin D and calcium, and regular exercise.  2. Anorexia, fatigue, nausea -Improved -Follow up with dietitian. Use Zofran as needed.  3. HTN and hypothyroidism  -continue follow up with PCP -TSH normal on  03/03/2015  Plan: -The 7th cycle Avast today and continue every 2 weeks  -see [name] neuro Oncology program next Friday 8/5 -Decrease his dexamethasone to 2 mg daily,every other day until I see him back in 2 weeks   All questions were answered. The patient knows to call the clinic with any problems, questions or concerns.  I spent 20  minutes counseling the patient face to face. The total time spent in the appointment was 25 minutes and more than 50% was on counseling.     Truitt Merle, MD 03/31/2015 1:36 PM

## 2015-04-07 ENCOUNTER — Other Ambulatory Visit: Payer: Self-pay | Admitting: Radiation Therapy

## 2015-04-07 ENCOUNTER — Other Ambulatory Visit: Payer: Self-pay | Admitting: Hematology

## 2015-04-07 DIAGNOSIS — C719 Malignant neoplasm of brain, unspecified: Secondary | ICD-10-CM

## 2015-04-07 DIAGNOSIS — C714 Malignant neoplasm of occipital lobe: Secondary | ICD-10-CM

## 2015-04-10 ENCOUNTER — Encounter: Payer: Self-pay | Admitting: Radiation Therapy

## 2015-04-10 NOTE — Progress Notes (Addendum)
1.  Do you need a wheel chair?    No  2. On oxygen? No  3. Have you ever had any surgery in the body part being scanned?  Yes 07/07/14  Dr. Saintclair Halsted   4. Have you ever had any surgery on your brain or heart?                                                     Yes, brain see #2  5. Have you ever had surgery on your eyes or ears?                          No  6. Do you have a pacemaker or defibrillator?   No  7. Do you have a Neurostimulator? No  8. Claustrophobic?  No  9. Any risk for metal in eyes?   No  10. Injury by bullet, buckshot, or shrapnel? No  11. Stent?        No                                                                                                               12. Hx of Cancer?                                                                                                               Type  13. Kidney or Liver disease?   No  14. Hx of Lupus, Rheumatoid Arthritis or Scleroderma?  No  15. IV Antibiotics or long term use of NSAIDS?   No  16. HX of Hypertension?  Yes  17. Diabetes?  No  18. Allergy to contrast?  No  19. Recent labs. To be drawn by Med Onc within the 6week window.  Mont Dutton

## 2015-04-14 ENCOUNTER — Ambulatory Visit (HOSPITAL_BASED_OUTPATIENT_CLINIC_OR_DEPARTMENT_OTHER): Payer: BLUE CROSS/BLUE SHIELD | Admitting: Nurse Practitioner

## 2015-04-14 ENCOUNTER — Other Ambulatory Visit (HOSPITAL_BASED_OUTPATIENT_CLINIC_OR_DEPARTMENT_OTHER): Payer: BLUE CROSS/BLUE SHIELD

## 2015-04-14 ENCOUNTER — Ambulatory Visit (HOSPITAL_BASED_OUTPATIENT_CLINIC_OR_DEPARTMENT_OTHER): Payer: BLUE CROSS/BLUE SHIELD

## 2015-04-14 VITALS — BP 144/69 | HR 91 | Temp 98.1°F | Resp 18 | Ht 71.0 in | Wt 251.0 lb

## 2015-04-14 VITALS — BP 127/68 | HR 78 | Temp 96.9°F | Resp 20

## 2015-04-14 DIAGNOSIS — R110 Nausea: Secondary | ICD-10-CM | POA: Diagnosis not present

## 2015-04-14 DIAGNOSIS — C719 Malignant neoplasm of brain, unspecified: Secondary | ICD-10-CM

## 2015-04-14 DIAGNOSIS — R5383 Other fatigue: Secondary | ICD-10-CM | POA: Diagnosis not present

## 2015-04-14 DIAGNOSIS — Z5112 Encounter for antineoplastic immunotherapy: Secondary | ICD-10-CM

## 2015-04-14 DIAGNOSIS — R630 Anorexia: Secondary | ICD-10-CM

## 2015-04-14 DIAGNOSIS — C714 Malignant neoplasm of occipital lobe: Secondary | ICD-10-CM | POA: Diagnosis not present

## 2015-04-14 DIAGNOSIS — E039 Hypothyroidism, unspecified: Secondary | ICD-10-CM

## 2015-04-14 DIAGNOSIS — I10 Essential (primary) hypertension: Secondary | ICD-10-CM

## 2015-04-14 LAB — CBC WITH DIFFERENTIAL/PLATELET
BASO%: 0.4 % (ref 0.0–2.0)
BASOS ABS: 0 10*3/uL (ref 0.0–0.1)
EOS%: 1 % (ref 0.0–7.0)
Eosinophils Absolute: 0.1 10*3/uL (ref 0.0–0.5)
HEMATOCRIT: 40.6 % (ref 38.4–49.9)
HEMOGLOBIN: 13.4 g/dL (ref 13.0–17.1)
LYMPH%: 20.3 % (ref 14.0–49.0)
MCH: 29.9 pg (ref 27.2–33.4)
MCHC: 33 g/dL (ref 32.0–36.0)
MCV: 90.4 fL (ref 79.3–98.0)
MONO#: 0.4 10*3/uL (ref 0.1–0.9)
MONO%: 4.7 % (ref 0.0–14.0)
NEUT#: 6.4 10*3/uL (ref 1.5–6.5)
NEUT%: 73.6 % (ref 39.0–75.0)
Platelets: 180 10*3/uL (ref 140–400)
RBC: 4.49 10*6/uL (ref 4.20–5.82)
RDW: 18.9 % — ABNORMAL HIGH (ref 11.0–14.6)
WBC: 8.7 10*3/uL (ref 4.0–10.3)
lymph#: 1.8 10*3/uL (ref 0.9–3.3)

## 2015-04-14 LAB — COMPREHENSIVE METABOLIC PANEL (CC13)
ALBUMIN: 3.3 g/dL — AB (ref 3.5–5.0)
ALT: 32 U/L (ref 0–55)
AST: 22 U/L (ref 5–34)
Alkaline Phosphatase: 64 U/L (ref 40–150)
Anion Gap: 10 mEq/L (ref 3–11)
BUN: 19 mg/dL (ref 7.0–26.0)
CALCIUM: 9.3 mg/dL (ref 8.4–10.4)
CO2: 22 mEq/L (ref 22–29)
Chloride: 106 mEq/L (ref 98–109)
Creatinine: 1 mg/dL (ref 0.7–1.3)
EGFR: 83 mL/min/{1.73_m2} — AB (ref >90)
Glucose: 123 mg/dl (ref 70–140)
Potassium: 3.6 mEq/L (ref 3.5–5.1)
Sodium: 138 mEq/L (ref 136–145)
TOTAL PROTEIN: 6.1 g/dL — AB (ref 6.4–8.3)
Total Bilirubin: 0.52 mg/dL (ref 0.20–1.20)

## 2015-04-14 MED ORDER — SODIUM CHLORIDE 0.9 % IV SOLN
1100.0000 mg | Freq: Once | INTRAVENOUS | Status: AC
  Administered 2015-04-14: 1100 mg via INTRAVENOUS
  Filled 2015-04-14: qty 44

## 2015-04-14 MED ORDER — SODIUM CHLORIDE 0.9 % IV SOLN
Freq: Once | INTRAVENOUS | Status: AC
  Administered 2015-04-14: 14:00:00 via INTRAVENOUS

## 2015-04-14 NOTE — Progress Notes (Addendum)
[place] OFFICE PROGRESS NOTE   Diagnosis:   Glioblastoma of occipital lobe   07/02/2014 Imaging Brain MRI: Two adjacent enhancing mass lesions in the right parietal lobe with central necrosis and extensive surrounding edema and mass effect. 7 mm midline shift to the left. 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 CT CAP(-)   07/07/2014 Surgery Subtotal right parietal tumors resection    08/03/2014 Concurrent Chemotherapy Concurrent radiation with Temodar 75 mg/m daily, completed on 09/15/2014.    09/29/2014 - 12/28/2014 Chemotherapy Adjuvant Temodar, 150-200mg /m2 for 5 days, every 28 day cycle, s/p 3 cycles, stopped due to disease progression.   10/14/2014 Imaging Two discrete foci of enhancement in the RIGHT occipital lobe surround the larger resection cavity, and are suspected to represent foci of residual tumor/GBM displaying interval growth   12/13/2014 Imaging Increased size/ enhancement of 2 lesions adjacent to the right occipital resection cavity, concerning for growth of residual tumor.   01/06/2015 -  Chemotherapy Bevacizumab 10mg /kg every 2 weeks    GBM (glioblastoma multiforme)   07/22/2014 Initial Diagnosis GBM (glioblastoma multiforme)    CURRENT THERAPY: Bevacizumab 10 mg/kg every 2 weeks, started on 01/06/2015 dexa 4mg  daily until 6/10, changed to 4mg  and 2mg  daily alternatively, then 2mg  daily from 7/1, then 2mg  daily 2 days on, 1 day off from 7/25        INTERVAL HISTORY:   Jake [name] returns for scheduled follow-up. He continues every 2 week Avastin. He denies any bleeding except associated with minor abrasions. He feels fatigued for a few days after each Avastin. No shortness of breath or chest pain. He has occasional ankle edema. No calf pain. He denies abdominal pain. No nausea or vomiting. He has occasional headaches. Stable visual deficits. He reports a good appetite. He is currently completing  dexamethasone taper using hydrocortisone per instructions from [place]. He reports a persistent ulcer at the left lower gumline. He notes pain with eating.  Objective:  Vital signs in last 24 hours:  Blood pressure 144/69, pulse 91, temperature 98.1 F (36.7 C), temperature source Oral, resp. rate 18, height 5\' 11"  (1.803 m), weight 251 lb (113.853 kg), SpO2 99 %.    HEENT: Question exposed bone left lower posterior gumline. Lymphatics: No palpable cervical or supravesicular lymph nodes. Resp: Lungs clear bilaterally. Cardio: Regular rate and rhythm. GI: Abdomen soft and nontender. No organomegaly. Vascular: Trace pitting edema at the lower legs bilaterally. Neuro: Alert and oriented. Motor strength 5 over 5. Persistent left-sided vision deficit.  Skin: Ecchymoses scattered over the forearms.    Lab Results:  Lab Results  Component Value Date   WBC 8.7 04/14/2015   HGB 13.4 04/14/2015   HCT 40.6 04/14/2015   MCV 90.4 04/14/2015   PLT 180 04/14/2015   NEUTROABS 6.4 04/14/2015    Imaging:  No results found.  Medications: I have reviewed the patient's current medications.  Assessment/Plan: 1. GBM, s/p subtotal resection, and concurrent chemoRT completed on 09/15/2014, progressed on adjuvant Temodar, now on second line Avastin 2. Anorexia, fatigue, nausea. Improved. 3. Hypertension and hypothyroidism    Disposition: Jake [name] continues every 2 week Avastin. He was seen at [facility] by Dr. Imagene Riches on 04/07/2015. He is being considered for a clinical trial ([id number]). A more recent MRI is needed. We have made a referral to radiology. He will schedule a follow-up visit at [facility] once the MRI has been completed. The plan is to continue Avastin for now. He will return for a  follow-up visit in 2 weeks.  We recommended he follow-up with his dentist regarding possible bone exposure left lower posterior gumline. He understands to let us know prior to proceeding with any procedure due to the  bleeding and delayed wound healing associated with Avastin.  Patient seen with Dr. Burr Medico.    Ned Card ANP/GNP-BC   04/14/2015  12:34 PM

## 2015-04-14 NOTE — Patient Instructions (Signed)
[facility] Discharge Instructions for Patients Receiving Chemotherapy  Today you received the following chemotherapy agents Avastin To help prevent nausea and vomiting after your treatment, we encourage you to take your nausea medication as prescribed.   If you develop nausea and vomiting that is not controlled by your nausea medication, call the clinic.   BELOW ARE SYMPTOMS THAT SHOULD BE REPORTED IMMEDIATELY:  *FEVER GREATER THAN 100.5 F  *CHILLS WITH OR WITHOUT FEVER  NAUSEA AND VOMITING THAT IS NOT CONTROLLED WITH YOUR NAUSEA MEDICATION  *UNUSUAL SHORTNESS OF BREATH  *UNUSUAL BRUISING OR BLEEDING  TENDERNESS IN MOUTH AND THROAT WITH OR WITHOUT PRESENCE OF ULCERS  *URINARY PROBLEMS  *BOWEL PROBLEMS  UNUSUAL RASH Items with * indicate a potential emergency and should be followed up as soon as possible.  Feel free to call the clinic you have any questions or concerns. The clinic phone number is [phone].  Please show the CHEMO ALERT CARD at check-in to the Emergency Department and triage nurse.   

## 2015-04-17 ENCOUNTER — Other Ambulatory Visit: Payer: Self-pay | Admitting: Hematology

## 2015-04-17 ENCOUNTER — Telehealth: Payer: Self-pay | Admitting: *Deleted

## 2015-04-17 NOTE — Telephone Encounter (Signed)
Per staff message and POF I have scheduled appts. Advised scheduler of appts. JMW  

## 2015-04-18 NOTE — Telephone Encounter (Signed)
PT.'S WIFE WILL CALL CENTRAL SCHEDULING,PEGGY, TO SCHEDULE HER HUSBAND'S MRI BRAIN.

## 2015-04-20 ENCOUNTER — Ambulatory Visit (HOSPITAL_COMMUNITY)
Admission: RE | Admit: 2015-04-20 | Discharge: 2015-04-20 | Disposition: A | Discharge status: Home / Self Care | Payer: BLUE CROSS/BLUE SHIELD | Source: Ambulatory Visit | Attending: Nurse Practitioner | Admitting: Nurse Practitioner

## 2015-04-20 DIAGNOSIS — Z923 Personal history of irradiation: Secondary | ICD-10-CM | POA: Diagnosis not present

## 2015-04-20 DIAGNOSIS — C719 Malignant neoplasm of brain, unspecified: Secondary | ICD-10-CM

## 2015-04-20 DIAGNOSIS — C718 Malignant neoplasm of overlapping sites of brain: Secondary | ICD-10-CM | POA: Diagnosis not present

## 2015-04-20 MED ORDER — GADOBENATE DIMEGLUMINE 529 MG/ML IV SOLN
20.0000 mL | Freq: Once | INTRAVENOUS | Status: AC | PRN
  Administered 2015-04-20: 20 mL via INTRAVENOUS

## 2015-04-21 ENCOUNTER — Telehealth: Payer: Self-pay | Admitting: Hematology

## 2015-04-21 NOTE — Telephone Encounter (Signed)
I called pt and discussed his brain MRI result. I suggested him to contact [facility] to see if he is eligible for the clinical trial.  I will fax the report to Dr. Imagene Riches at [facility] on Monday.   Jake [name]  04/21/2015

## 2015-04-24 ENCOUNTER — Telehealth: Payer: Self-pay | Admitting: *Deleted

## 2015-04-24 NOTE — Telephone Encounter (Signed)
Faxed results of MRI Brain to Dr. Aldine Contes @ [name] as per Dr. Ernestina Penna instructions - Att :  Percival Spanish. Fax     [phone]  ;     [phone].

## 2015-04-27 ENCOUNTER — Ambulatory Visit (INDEPENDENT_AMBULATORY_CARE_PROVIDER_SITE_OTHER): Payer: BLUE CROSS/BLUE SHIELD | Admitting: Emergency Medicine

## 2015-04-27 VITALS — BP 114/70 | HR 99 | Temp 97.4°F | Resp 18 | Wt 251.0 lb

## 2015-04-27 DIAGNOSIS — K1379 Other lesions of oral mucosa: Secondary | ICD-10-CM

## 2015-04-27 MED ORDER — CEPHALEXIN 500 MG PO CAPS: 500.0000 mg | ORAL_CAPSULE | Freq: Four times a day (QID) | ORAL | Status: DC

## 2015-04-27 NOTE — Progress Notes (Signed)
Subjective:  Patient ID: Jake [name], male    DOB: 1956/08/07  Age: 59 y.o. MRN: [id number]  CC: Mouth Lesions   HPI Jake [name] presents  with a concern about a bony ridge to its eroding through his left gum. He is having a full dental extraction and has a small area approximately a centimeter in length on the left mandibular jaw for the, is atrophied and the bone is protruding.  History Jake [name] has a past medical history of Thyroid disease; Hypertension; Allergy; Arthritis; Sleep apnea; Kidney stone; Glioblastoma multiforme of occipital lobe; Nocturnal leg cramps (09/29/2014); and History of radiation therapy (08/03/14- 09/15/14).   He has past surgical history that includes Knee arthroscopy w/ meniscal repair; Treatment fistula anal; and Craniotomy (N/A, 07/07/2014).   His  family history includes Diabetes in his brother, brother, and father; Glaucoma in his father; Hypertension in his mother. There is no history of Colon cancer.  He   reports that he has been smoking Cigarettes.  He has a 45 pack-year smoking history. He has never used smokeless tobacco. He reports that he drinks alcohol. He reports that he does not use illicit drugs.  Outpatient Prescriptions Prior to Visit  Medication Sig Dispense Refill  . bevacizumab in sodium chloride 0.9 % 100 mL Inject into the vein every 14 (fourteen) days.    . clonazePAM (KLONOPIN) 1 MG tablet Take 1-2 tablets (1-2 mg total) by mouth at bedtime. 30 tablet 3  . dexamethasone (DECADRON) 0.5 MG tablet Take 1.5 mg daily x2weeks, then 1 mg daily x2weeks, then 0.5 mg daily x2 weeks, then taper off. 8/6-8/19 dexamethasone 1.5mg  in the am 8/20-9/2 dexamethasone 1 mg in the am 9/3-9/16 dexamethasone 0.5mg  in am 9/17-9/23 dexamethasone 0.5 mg every other day 9/24 stop dexamethasone    . dexamethasone (DECADRON) 4 MG tablet 1 tab daily 30 tablet 1  . docusate sodium (COLACE) 100 MG capsule Take 100 mg by mouth 2 (two) times daily.    Marland Kitchen  HYDROcodone-acetaminophen (NORCO/VICODIN) 5-325 MG per tablet Take 1 tablet by mouth every 6 (six) hours as needed for moderate pain. 30 tablet 0  . hydrocortisone (CORTEF) 10 MG tablet Take 20 mg (2 tabs) in AM, 10 mg (1 tab) in PM    . levothyroxine (SYNTHROID, LEVOTHROID) 50 MCG tablet Take 1 tablet (50 mcg total) by mouth daily before breakfast. 90 tablet 3  . lisinopril (PRINIVIL,ZESTRIL) 40 MG tablet Take 1 tablet (40 mg total) by mouth daily. 90 tablet 3  . LORazepam (ATIVAN) 0.5 MG tablet take 1 to 2 tablets by mouth every 8 hours if needed for anxiety nausea or sleep 60 tablet 0  . Multiple Vitamins-Minerals (MULTIVITAMIN WITH MINERALS) tablet Take 1 tablet by mouth daily.    Marland Kitchen NEXIUM 24HR 20 MG capsule   0  . ondansetron (ZOFRAN) 8 MG tablet Take 1 tablet (8 mg total) by mouth every 8 (eight) hours as needed for nausea or vomiting. 30 tablet 4  . Probiotic Product (PROBIOTIC DAILY PO) Take by mouth daily.     . prochlorperazine (COMPAZINE) 10 MG tablet Take 1 tablet (10 mg total) by mouth every 6 (six) hours as needed for nausea or vomiting. 60 tablet 1  . senna (SENOKOT) 8.6 MG tablet Take 1 tablet by mouth daily.    . verapamil (COVERA HS) 180 MG (CO) 24 hr tablet Take 1 tablet (180 mg total) by mouth at bedtime. 90 tablet 3   No facility-administered medications prior to visit.  Social History   Social History  . Marital Status: Married    Spouse Name: N/A  . Number of Children: 5  . Years of Education: college   Social History Main Topics  . Smoking status: Current Every Day Smoker -- 1.00 packs/day for 45 years    Types: Cigarettes  . Smokeless tobacco: Never Used  . Alcohol Use: 0.0 oz/week    0 Standard drinks or equivalent per week     Comment: 2-3 beers per/day   . Drug Use: No  . Sexual Activity: Not Asked   Other Topics Concern  . None   Social History Narrative   Patient is right handed.   Patient drinks 2 cups daily        Review of Systems    Constitutional: Negative for fever, chills and appetite change.  HENT: Negative for congestion, ear pain, postnasal drip, sinus pressure and sore throat.   Eyes: Negative for pain and redness.  Respiratory: Negative for cough, shortness of breath and wheezing.   Cardiovascular: Negative for leg swelling.  Gastrointestinal: Negative for nausea, vomiting, abdominal pain, diarrhea, constipation and blood in stool.  Endocrine: Negative for polyuria.  Genitourinary: Negative for dysuria, urgency, frequency and flank pain.  Musculoskeletal: Negative for gait problem.  Skin: Negative for rash.  Neurological: Negative for weakness and headaches.  Psychiatric/Behavioral: Negative for confusion and decreased concentration. The patient is not nervous/anxious.     Objective:  BP 114/70 mmHg  Pulse 99  Temp(Src) 97.4 F (36.3 C) (Oral)  Resp 18  Wt 251 lb (113.853 kg)  SpO2 99%  Physical Exam  Constitutional: He is oriented to person, place, and time. He appears well-developed and well-nourished.  HENT:  Head: Normocephalic and atraumatic.  Mouth/Throat: Oral lesions (His original bone protruding from it gingiva) present.  Eyes: Conjunctivae are normal. Pupils are equal, round, and reactive to light.  Pulmonary/Chest: Effort normal.  Musculoskeletal: He exhibits no edema.  Neurological: He is alert and oriented to person, place, and time.  Skin: Skin is dry.  Psychiatric: He has a normal mood and affect. His behavior is normal. Thought content normal.      Assessment & Plan:   Jake [name] was seen today for mouth lesions.  Diagnoses and all orders for this visit:  Dental sore  Other orders -     cephALEXin (KEFLEX) 500 MG capsule; Take 1 capsule (500 mg total) by mouth 4 (four) times daily.   I am having Jake [name] start on cephALEXin. I am also having him maintain his multivitamin with minerals, Probiotic Product (PROBIOTIC DAILY PO), ondansetron, prochlorperazine, NEXIUM 24HR,  docusate sodium, senna, dexamethasone, bevacizumab in sodium chloride 0.9 % 100 mL, clonazePAM, verapamil, lisinopril, levothyroxine, HYDROcodone-acetaminophen, hydrocortisone, dexamethasone, and LORazepam.  Meds ordered this encounter  Medications  . cephALEXin (KEFLEX) 500 MG capsule    Sig: Take 1 capsule (500 mg total) by mouth 4 (four) times daily.    Dispense:  40 capsule    Refill:  0    Appropriate red flag conditions were discussed with the patient as well as actions that should be taken.  Patient expressed his understanding.  Follow-up: Return if symptoms worsen or fail to improve.  Roselee Culver, MD

## 2015-04-28 ENCOUNTER — Encounter: Payer: Self-pay | Admitting: Hematology

## 2015-04-28 ENCOUNTER — Ambulatory Visit
Admission: RE | Admit: 2015-04-28 | Discharge: 2015-04-28 | Disposition: A | Discharge status: Home / Self Care | Payer: BLUE CROSS/BLUE SHIELD | Source: Ambulatory Visit | Attending: Radiation Oncology | Admitting: Radiation Oncology

## 2015-04-28 ENCOUNTER — Other Ambulatory Visit (HOSPITAL_BASED_OUTPATIENT_CLINIC_OR_DEPARTMENT_OTHER): Payer: BLUE CROSS/BLUE SHIELD

## 2015-04-28 ENCOUNTER — Telehealth: Payer: Self-pay | Admitting: *Deleted

## 2015-04-28 ENCOUNTER — Telehealth: Payer: Self-pay | Admitting: Oncology

## 2015-04-28 ENCOUNTER — Ambulatory Visit (HOSPITAL_BASED_OUTPATIENT_CLINIC_OR_DEPARTMENT_OTHER): Payer: BLUE CROSS/BLUE SHIELD

## 2015-04-28 ENCOUNTER — Telehealth: Payer: Self-pay | Admitting: Hematology

## 2015-04-28 ENCOUNTER — Ambulatory Visit (HOSPITAL_BASED_OUTPATIENT_CLINIC_OR_DEPARTMENT_OTHER): Payer: BLUE CROSS/BLUE SHIELD | Admitting: Hematology

## 2015-04-28 VITALS — BP 147/80 | HR 92 | Temp 98.1°F | Resp 18 | Ht 71.0 in | Wt 250.9 lb

## 2015-04-28 VITALS — BP 118/74

## 2015-04-28 DIAGNOSIS — R110 Nausea: Secondary | ICD-10-CM

## 2015-04-28 DIAGNOSIS — C713 Malignant neoplasm of parietal lobe: Secondary | ICD-10-CM

## 2015-04-28 DIAGNOSIS — C719 Malignant neoplasm of brain, unspecified: Secondary | ICD-10-CM

## 2015-04-28 DIAGNOSIS — R5383 Other fatigue: Secondary | ICD-10-CM

## 2015-04-28 DIAGNOSIS — C714 Malignant neoplasm of occipital lobe: Secondary | ICD-10-CM

## 2015-04-28 DIAGNOSIS — Z5111 Encounter for antineoplastic chemotherapy: Secondary | ICD-10-CM | POA: Diagnosis not present

## 2015-04-28 DIAGNOSIS — Z5112 Encounter for antineoplastic immunotherapy: Secondary | ICD-10-CM

## 2015-04-28 DIAGNOSIS — E039 Hypothyroidism, unspecified: Secondary | ICD-10-CM

## 2015-04-28 DIAGNOSIS — R630 Anorexia: Secondary | ICD-10-CM

## 2015-04-28 LAB — COMPREHENSIVE METABOLIC PANEL (CC13)
ALT: 33 U/L (ref 0–55)
AST: 25 U/L (ref 5–34)
Albumin: 3.3 g/dL — ABNORMAL LOW (ref 3.5–5.0)
Alkaline Phosphatase: 68 U/L (ref 40–150)
Anion Gap: 8 mEq/L (ref 3–11)
BILIRUBIN TOTAL: 0.55 mg/dL (ref 0.20–1.20)
BUN: 15.8 mg/dL (ref 7.0–26.0)
CHLORIDE: 107 meq/L (ref 98–109)
CO2: 24 meq/L (ref 22–29)
CREATININE: 0.9 mg/dL (ref 0.7–1.3)
Calcium: 9.4 mg/dL (ref 8.4–10.4)
EGFR: >90 mL/min/{1.73_m2} (ref >90)
GLUCOSE: 112 mg/dL (ref 70–140)
Potassium: 4.3 mEq/L (ref 3.5–5.1)
Sodium: 139 mEq/L (ref 136–145)
TOTAL PROTEIN: 6.1 g/dL — AB (ref 6.4–8.3)

## 2015-04-28 LAB — CBC WITH DIFFERENTIAL/PLATELET
BASO%: 1.4 % (ref 0.0–2.0)
BASOS ABS: 0.1 10*3/uL (ref 0.0–0.1)
EOS%: 0.8 % (ref 0.0–7.0)
Eosinophils Absolute: 0.1 10*3/uL (ref 0.0–0.5)
HEMATOCRIT: 42.5 % (ref 38.4–49.9)
HGB: 13.9 g/dL (ref 13.0–17.1)
LYMPH%: 22.7 % (ref 14.0–49.0)
MCH: 29.7 pg (ref 27.2–33.4)
MCHC: 32.7 g/dL (ref 32.0–36.0)
MCV: 90.9 fL (ref 79.3–98.0)
MONO#: 0.5 10*3/uL (ref 0.1–0.9)
MONO%: 5.8 % (ref 0.0–14.0)
NEUT%: 69.3 % (ref 39.0–75.0)
NEUTROS ABS: 5.5 10*3/uL (ref 1.5–6.5)
Platelets: 156 10*3/uL (ref 140–400)
RBC: 4.67 10*6/uL (ref 4.20–5.82)
RDW: 18.6 % — ABNORMAL HIGH (ref 11.0–14.6)
WBC: 7.9 10*3/uL (ref 4.0–10.3)
lymph#: 1.8 10*3/uL (ref 0.9–3.3)

## 2015-04-28 MED ORDER — SODIUM CHLORIDE 0.9 % IV SOLN
Freq: Once | INTRAVENOUS | Status: AC
  Administered 2015-04-28: 14:00:00 via INTRAVENOUS

## 2015-04-28 MED ORDER — ATROPINE SULFATE 1 MG/ML IJ SOLN
INTRAMUSCULAR | Status: AC
  Filled 2015-04-28: qty 1

## 2015-04-28 MED ORDER — ONDANSETRON HCL 40 MG/20ML IJ SOLN
Freq: Once | INTRAMUSCULAR | Status: AC
  Administered 2015-04-28: 15:00:00 via INTRAVENOUS
  Filled 2015-04-28: qty 8

## 2015-04-28 MED ORDER — DEXTROSE 5 % IV SOLN
125.6000 mg/m2 | Freq: Once | INTRAVENOUS | Status: AC
  Administered 2015-04-28: 300 mg via INTRAVENOUS
  Filled 2015-04-28: qty 15

## 2015-04-28 MED ORDER — SODIUM CHLORIDE 0.9 % IV SOLN
9.7500 mg/kg | Freq: Once | INTRAVENOUS | Status: AC
  Administered 2015-04-28: 1100 mg via INTRAVENOUS
  Filled 2015-04-28: qty 44

## 2015-04-28 MED ORDER — ATROPINE SULFATE 1 MG/ML IJ SOLN: 1.0000 mg | Freq: Once | INTRAMUSCULAR | Status: DC | PRN

## 2015-04-28 NOTE — Telephone Encounter (Signed)
per pof to sch pt appt-sent MW email to sch pt trmt-will call pt after reply-sent Dr Burr Medico email to send referral for Dr Raliegh Ip

## 2015-04-28 NOTE — Telephone Encounter (Signed)
Talked to [name] in infusion.  She will notify Jake [name] that his follow up appointment with Dr. Isidore Moos has been rescheduled for Monday at 10:45 am.

## 2015-04-28 NOTE — Telephone Encounter (Signed)
Called & left message at Dr Ritta Slot office @ [phone] inquiring if he would see pt due to exposed bone in jaw & pain.  PCP suggested oral surgeon/dentist but pt states he has [employer] & is having a hard time finding a dentist that takes his insurance.

## 2015-04-28 NOTE — Progress Notes (Signed)
[facility] FOLLOW UP NOTE  Patient Care Team: [name], MD as PCP - General (Family Medicine) Kary Kos, MD as Consulting Physician (Neurosurgery) Eppie Gibson, MD as Attending Physician (Radiation Oncology) Truitt Merle, MD as Consulting Physician (Hematology)     Glioblastoma of occipital lobe   07/02/2014 Imaging Brain MRI: Two adjacent enhancing mass lesions in the right parietal lobe with central necrosis and extensive surrounding edema and mass effect.  7 mm midline shift to the left.  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   CT CAP(-)   07/07/2014 Surgery Subtotal right parietal tumors resection    08/03/2014 Concurrent Chemotherapy Concurrent radiation with Temodar 75 mg/m daily, completed on 09/15/2014.    09/29/2014 - 12/28/2014 Chemotherapy Adjuvant Temodar, 150-200mg /m2 for 5 days, every 28 day cycle, s/p 3 cycles, stopped due to disease progression.   10/14/2014 Imaging Two discrete foci of enhancement in the RIGHT occipital lobe surround the larger resection cavity, and are suspected to represent foci of residual tumor/GBM displaying interval growt   12/13/2014 Imaging Increased size/ enhancement of 2 lesions adjacent to the right occipital resection cavity, concerning for growth of residual tumor.   01/06/2015 -  Chemotherapy Bevacizumab 10mg /kg every 2 weeks    GBM (glioblastoma multiforme)   07/22/2014 Initial Diagnosis GBM (glioblastoma multiforme)    CURRENT THERAPY: Bevacizumab 10 mg/kg every 2 weeks, started on 01/06/2059 dexa 4mg  daily until 6/10, changed to 4mg  and 2mg  daily alternatively, then 2mg  daily from 7/1, then 2mg  daily 2 days on, 1 day off from 7/25, hydrocortisone was added onto her steroids tapering by [name] in early August.  CHIEF COMPLAINTS: follow up GBM   HISTORY OF INITIAL PRESENTATION;   He initially presented was headaches and sinus congestion in mid October 2015. He was seen by his primary care physician and was  treated for sinus infection. He subsequently developed left sided visual deficit, left arm and leg weakness and unsteady gait. He was sent to emergency room by his primary care physician on 07/02/2014. CT  And MRI of brain reviewed to add adjacent enhancing mass lesions in the right parietal lobe with central necrosis and extensive surrounding edema and mass effect. He was admitted and underwent right craniotomy  For resection of the 2 brain masses by Dr. Lonell Grandchild. Postsurgical brain MRI revealed a 1.3 cm area of enhancement at the anterior margin of surgical resection cavity, likely surgical change. His case was reviewed in the tumor board and it was felt he had subtotal resection.  INTERVAL HISTROY: Lenward returns for follow-up. He is doing moderately well. Since hydrocortisone was added on to his see was tapering about 3 weeks ago, he feels his energy and appetite appetite has improved. He is currently on dexamethasone 1 mg daily, and hydrocortisone 20 mg in the morning, 10 mg in the afternoon. His left vision deficient is unchanged. He has mild intermittent headaches, no other new neuro symptoms.  MEDICAL HISTORY:  Past Medical History  Diagnosis Date  . Thyroid disease   . Hypertension   . Allergy   . Arthritis   .    Marland Kitchen Kidney stone     SURGICAL HISTORY: Past Surgical History  Procedure Laterality Date  . Knee arthroscopy w/ meniscal repair    . Treatment fistula anal    . 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 Marital Status: Married    Spouse Name: N/A    Number of Children: 5 children, age of 28-21  . Years of Education: N/A   Occupational History  . 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    Social History Main Topics  . Smoking status: Current Every Day Smoker --  1.00 packs/day for 45 years    Types: Cigarettes  . Smokeless tobacco: Never Used  . Alcohol Use: Yes     Comment: 2-3 beers per/day   . Drug Use: No  . Sexual Activity: Not on file     FAMILY HISTORY: Family History  Problem Relation Age of Onset  . Hypertension Mother   . Diabetes Father   . Glaucoma Father   . Diabetes Brother   . Diabetes Brother   . Colon cancer Neg Hx   Paternal ancle had prostate cancer. No other family history of malignancy.  ALLERGIES:  has No Known Allergies.   MEDICATIONS:  Current Outpatient Prescriptions  Medication Sig Dispense Refill  . bevacizumab in sodium chloride 0.9 % 100 mL Inject into the vein every 14 (fourteen) days.    . cephALEXin (KEFLEX) 500 MG capsule Take 1 capsule (500 mg total) by mouth 4 (four) times daily. 40 capsule 0  . clonazePAM (KLONOPIN) 1 MG tablet Take 1-2 tablets (1-2 mg total) by mouth at bedtime. 30 tablet 3  . dexamethasone (DECADRON) 0.5 MG tablet Take 1.5 mg daily x2weeks, then 1 mg daily x2weeks, then 0.5 mg daily x2 weeks, then taper off. 8/6-8/19 dexamethasone 1.5mg  in the am 8/20-9/2 dexamethasone 1 mg in the am 9/3-9/16 dexamethasone 0.5mg  in am 9/17-9/23 dexamethasone 0.5 mg every other day 9/24 stop dexamethasone    . docusate sodium (COLACE) 100 MG capsule Take 100 mg by mouth 2 (two) times daily.    Marland Kitchen HYDROcodone-acetaminophen (NORCO/VICODIN) 5-325 MG per tablet Take 1 tablet by mouth every 6 (six) hours as needed for moderate pain. 30 tablet 0  . hydrocortisone (CORTEF) 10 MG tablet Take 20 mg (2 tabs) in AM, 10 mg (1 tab) in PM    . levothyroxine (SYNTHROID, LEVOTHROID) 50 MCG tablet Take 1 tablet (50 mcg total) by mouth daily before breakfast. 90 tablet 3  . lisinopril (PRINIVIL,ZESTRIL) 40 MG tablet Take 1 tablet (40 mg total) by mouth daily. 90 tablet 3  . LORazepam (ATIVAN) 0.5 MG tablet take 1 to 2 tablets by mouth every 8 hours if needed for anxiety nausea or sleep 60 tablet 0  . Multiple  Vitamins-Minerals (MULTIVITAMIN WITH MINERALS) tablet Take 1 tablet by mouth daily.    Marland Kitchen NEXIUM 24HR 20 MG capsule   0  . ondansetron (ZOFRAN) 8 MG tablet Take 1 tablet (8 mg total) by mouth every 8 (eight) hours as needed for nausea or vomiting. 30 tablet 4  . Probiotic Product (PROBIOTIC DAILY PO) Take by mouth daily.     . prochlorperazine (COMPAZINE) 10 MG tablet Take 1 tablet (10 mg total) by mouth every 6 (six) hours as needed for nausea or vomiting. 60 tablet 1  . senna (SENOKOT) 8.6 MG tablet Take 1 tablet by mouth daily.    . verapamil (COVERA HS) 180 MG (CO) 24 hr tablet Take 1 tablet (180 mg total) by mouth at bedtime. 90 tablet 3   No current facility-administered medications for this visit.    REVIEW OF SYSTEMS:   Constitutional: Denies fevers, chills  or abnormal night sweats Eyes: Denies blurriness of vision, double vision or watery eyes Ears, nose, mouth, throat, and face: Denies mucositis or sore throat Respiratory: Denies cough, dyspnea or wheezes Cardiovascular: Denies palpitation, chest discomfort or lower extremity swelling Gastrointestinal:  Denies nausea, heartburn or change in bowel habits Skin: Denies abnormal skin rashes Lymphatics: Denies new lymphadenopathy or easy bruising Neurological:positive for mild headaches, and left side vision deficits. His left-sided weakness has nearly resolved. Denies numbness, tingling or new weaknesses Behavioral/Psych: Mood is stable, no new changes  All other systems were reviewed with the patient and are negative.  PHYSICAL EXAMINATION: ECOG PERFORMANCE STATUS: 1 - Symptomatic but completely ambulatory  KPS: 80%  Filed Vitals:   04/28/15 1235  BP: 147/80  Pulse: 92  Temp: 98.1 F (36.7 C)  Resp: 18   Filed Weights   04/28/15 1235  Weight: 250 lb 14.4 oz (113.807 kg)    GENERAL:alert, no distress and comfortable SKIN: skin color, texture, turgor are normal, no rashes or significant lesions HEAD: right-sided  posterior craniotomy surgical wound is healing well, no surrounding edema, skin redness or discharge EYES: normal, conjunctiva are pink and non-injected, sclera clear OROPHARYNX:no exudate, no erythema and lips, buccal mucosa, and tongue normal  NECK: supple, thyroid normal size, non-tender, without nodularity LYMPH:  no palpable lymphadenopathy in the cervical, axillary or inguinal LUNGS: clear to auscultation and percussion with normal breathing effort HEART: regular rate & rhythm and no murmurs and no lower extremity edema ABDOMEN:abdomen soft, non-tender and normal bowel sounds Musculoskeletal:no cyanosis of digits and no clubbing  PSYCH: alert & oriented x 3 with fluent speech NEURO: cranial nerve 2-12 are intact. Left side vision deficits is present in the left upper and mid quadrant. Motor and sensitivity are normal and symmetric on all extremities. Speech is fluent, coordination is grossly intact. Ambulates independently and Lutricia Feil sign negative. EXT: Bilateral pitting edema in ankles, better than before   LABORATORY DATA:  CBC Latest Ref Rng 04/28/2015 04/14/2015 03/31/2015  WBC 4.0 - 10.3 10e3/uL 7.9 8.7 7.7  Hemoglobin 13.0 - 17.1 g/dL 13.9 13.4 12.8(L)  Hematocrit 38.4 - 49.9 % 42.5 40.6 37.9(L)  Platelets 140 - 400 10e3/uL 156 180 168    CMP Latest Ref Rng 04/28/2015 04/14/2015 03/17/2015  Glucose 70 - 140 mg/dl 112 123 93  BUN 7.0 - 26.0 mg/dL 15.8 19.0 16.2  Creatinine 0.7 - 1.3 mg/dL 0.9 1.0 0.8  Sodium 136 - 145 mEq/L 139 138 137  Potassium 3.5 - 5.1 mEq/L 4.3 3.6 4.0  Chloride 96 - 112 mEq/L - - -  CO2 22 - 29 mEq/L 24 22 23   Calcium 8.4 - 10.4 mg/dL 9.4 9.3 9.7  Total Protein 6.4 - 8.3 g/dL 6.1(L) 6.1(L) 6.2(L)  Total Bilirubin 0.20 - 1.20 mg/dL 0.55 0.52 0.66  Alkaline Phos 40 - 150 U/L 68 64 68  AST 5 - 34 U/L 25 22 25   ALT 0 - 55 U/L 33 32 32     Surgical path 07/07/2014  Diagnosis 1. Brain, biopsy - GLIOBLASTOMA MULTIFORME, WHO GRADE IV/IV. - SEE ONCOLOGY  TABLE BELOW. 2. Brain, for tumor resection, higher right parietal - GLIOBLASTOMA MULTIFORME, WHO GRADE IV/IV. - SEE ONCOLOGY TABLE BELOW. 3. Brain, for tumor resection, lower right occipital - GLIOBLASTOMA MULTIFORME, WHO GRADE IV/IV   Microscopic Comment 1. -3. ONCOLOGY TABLE - BRAIN AND SPINAL CORD 1. Procedure: Resection x2 2. Tumor site, including laterality: Right parietal and right occipital 3. Maximum tumor size (cm): At least 1.8  cm 4. Histologic type: Glioblastoma multiforme 5. Grade: WHO Grade IV/IV 6. Margins (if applicable): Can not be assessed 7. Ancillary studies: Can be performed upon clinician request.  RADIOGRAPHIC STUDIES: I have personally reviewed the radiological images as listed and agreed with the findings in the report.  Mr [name] Wo Contrast 04/20/2015 IMPRESSION: 1. Progressed mass like signal abnormality in the right occipital lobe most compatible with interval true progression of disease. Patchy associated enhancement. Effacement of the right occipital horn and mild mass effect on the atrium. 2. No ventriculomegaly. No other acute intracranial abnormality.  ASSESSMENT & PLAN:  Mr. Antrim is a 59 year old gentleman with newly diagnosed right parietal GBM, status post subtotal resection.   1. GBM, s/p subtotal resection, and concurrent chemoRT completed on 09/15/2014, progressed on adjuvant Temodar.  -I reviewed his restaging brain MRI from 4.12.16,  previous 2 small foci of enhancement around the surgical site has significantly increased on the repeated ischemic. This was reviewed in our brain tumor board this morning, and we agreed this is tumor for progression. Unfortunately, due to the position of this 2 lesions, subtotal resection are unlikely and surgery is not offered -We discussed systemic therapy options. I recommend second line Avastin. He progressed through Temodar, I'll stop Temodar. -His recent restaging brain MRI from 04/20/15 unfortunately  showed disease progression. This was reviewed with patient and his wife. I also discussed with neuro oncologist Dr. Imagene Riches today and she recommended adding irinotecan to avastin, every 2 weeks. She may have a clinical trial option for her in 3-4 months.  --Chemotherapy consent: Side effects including but does not not limited to, fatigue, nausea, vomiting, diarrhea, hair loss, neuropathy, fluid retention, renal and kidney dysfunction, neutropenic fever, needed for blood transfusion, bleeding, were discussed with patient in great detail. he agrees to proceed.  -We'll start Avastin and urine T10 today -She will continue tapering steroids per [name]'s recommendation -cont Nexium daily when he is on steroids, vitamin the vitamin D and calcium, and regular exercise.  2. Anorexia, fatigue, nausea -Improved -Follow up with dietitian. Use Zofran as needed.  3. HTN and hypothyroidism  -continue follow up with PCP -TSH normal on 03/03/2015  Plan: -add Irinotecan to Avast today and continue every 2 weeks  -I will see him back in 2 weeks   All questions were answered. The patient knows to call the clinic with any problems, questions or concerns.  I spent 20 minutes counseling the patient face to face. The total time spent in the appointment was 25 minutes and more than 50% was on counseling.     Truitt Merle, MD 04/28/2015 10:45 PM

## 2015-04-28 NOTE — Patient Instructions (Signed)
Irinotecan injection What is this medicine? IRINOTECAN (ir in oh TEE kan ) is a chemotherapy drug. It is used to treat colon and rectal cancer. This medicine may be used for other purposes; ask your health care provider or pharmacist if you have questions. COMMON BRAND NAME(S): Camptosar What should I tell my health care provider before I take this medicine? They need to know if you have any of these conditions: -blood disorders -dehydration -diarrhea -infection (especially a virus infection such as chickenpox, cold sores, or herpes) -liver disease -low blood counts, like low white cell, platelet, or red cell counts -recent or ongoing radiation therapy -an unusual or allergic reaction to irinotecan, sorbitol, other chemotherapy, other medicines, foods, dyes, or preservatives -pregnant or trying to get pregnant -breast-feeding How should I use this medicine? This drug is given as an infusion into a vein. It is administered in a hospital or clinic by a specially trained health care professional. Talk to your pediatrician regarding the use of this medicine in children. Special care may be needed. Overdosage: If you think you have taken too much of this medicine contact a poison control center or emergency room at once. NOTE: This medicine is only for you. Do not share this medicine with others. What if I miss a dose? It is important not to miss your dose. Call your doctor or health care professional if you are unable to keep an appointment. What may interact with this medicine? Do not take this medicine with any of the following medications: -atazanavir -certain medicines for fungal infections like itraconazole and ketoconazole -St. John's Wort This medicine may also interact with the following medications: -dexamethasone -diuretics -laxatives -medicines for seizures like carbamazepine, mephobarbital, phenobarbital, phenytoin, primidone -medicines to increase blood counts like  filgrastim, pegfilgrastim, sargramostim -prochlorperazine -vaccines This list may not describe all possible interactions. Give your health care provider a list of all the medicines, herbs, non-prescription drugs, or dietary supplements you use. Also tell them if you smoke, drink alcohol, or use illegal drugs. Some items may interact with your medicine. What should I watch for while using this medicine? Your condition will be monitored carefully while you are receiving this medicine. You will need important blood work done while you are taking this medicine. This drug may make you feel generally unwell. This is not uncommon, as chemotherapy can affect healthy cells as well as cancer cells. Report any side effects. Continue your course of treatment even though you feel ill unless your doctor tells you to stop. In some cases, you may be given additional medicines to help with side effects. Follow all directions for their use. You may get drowsy or dizzy. Do not drive, use machinery, or do anything that needs mental alertness until you know how this medicine affects you. Do not stand or sit up quickly, especially if you are an older patient. This reduces the risk of dizzy or fainting spells. Call your doctor or health care professional for advice if you get a fever, chills or sore throat, or other symptoms of a cold or flu. Do not treat yourself. This drug decreases your body's ability to fight infections. Try to avoid being around people who are sick. This medicine may increase your risk to bruise or bleed. Call your doctor or health care professional if you notice any unusual bleeding. Be careful brushing and flossing your teeth or using a toothpick because you may get an infection or bleed more easily. If you have any dental work done, tell  your dentist you are receiving this medicine. Avoid taking products that contain aspirin, acetaminophen, ibuprofen, naproxen, or ketoprofen unless instructed by your  doctor. These medicines may hide a fever. Do not become pregnant while taking this medicine. Women should inform their doctor if they wish to become pregnant or think they might be pregnant. There is a potential for serious side effects to an unborn child. Talk to your health care professional or pharmacist for more information. Do not breast-feed an infant while taking this medicine. What side effects may I notice from receiving this medicine? Side effects that you should report to your doctor or health care professional as soon as possible: -allergic reactions like skin rash, itching or hives, swelling of the face, lips, or tongue -low blood counts - this medicine may decrease the number of white blood cells, red blood cells and platelets. You may be at increased risk for infections and bleeding. -signs of infection - fever or chills, cough, sore throat, pain or difficulty passing urine -signs of decreased platelets or bleeding - bruising, pinpoint red spots on the skin, black, tarry stools, blood in the urine -signs of decreased red blood cells - unusually weak or tired, fainting spells, lightheadedness -breathing problems -chest pain -diarrhea -feeling faint or lightheaded, falls -flushing, runny nose, sweating during infusion -mouth sores or pain -pain, swelling, redness or irritation where injected -pain, swelling, warmth in the leg -pain, tingling, numbness in the hands or feet -problems with balance, talking, walking -stomach cramps, pain -trouble passing urine or change in the amount of urine -vomiting as to be unable to hold down drinks or food -yellowing of the eyes or skin Side effects that usually do not require medical attention (report to your doctor or health care professional if they continue or are bothersome): -constipation -hair loss -headache -loss of appetite -nausea, vomiting -stomach upset This list may not describe all possible side effects. Call your doctor for  medical advice about side effects. You may report side effects to FDA at 1-800-FDA-1088. Where should I keep my medicine? This drug is given in a hospital or clinic and will not be stored at home. NOTE: This sheet is a summary. It may not cover all possible information. If you have questions about this medicine, talk to your doctor, pharmacist, or health care provider.  2015, Elsevier/Gold Standard. (2013-02-15 16:29:32)   [facility] Discharge Instructions for Patients Receiving Chemotherapy  Today you received the following chemotherapy agents Avastin/Irinotecan.  To help prevent nausea and vomiting after your treatment, we encourage you to take your nausea medication as directed.   If you develop nausea and vomiting that is not controlled by your nausea medication, call the clinic.   BELOW ARE SYMPTOMS THAT SHOULD BE REPORTED IMMEDIATELY:  *FEVER GREATER THAN 100.5 F  *CHILLS WITH OR WITHOUT FEVER  NAUSEA AND VOMITING THAT IS NOT CONTROLLED WITH YOUR NAUSEA MEDICATION  *UNUSUAL SHORTNESS OF BREATH  *UNUSUAL BRUISING OR BLEEDING  TENDERNESS IN MOUTH AND THROAT WITH OR WITHOUT PRESENCE OF ULCERS  *URINARY PROBLEMS  *BOWEL PROBLEMS  UNUSUAL RASH Items with * indicate a potential emergency and should be followed up as soon as possible.  Feel free to call the clinic you have any questions or concerns. The clinic phone number is [phone].  Please show the [facility] at check-in to the Emergency Department and triage nurse.

## 2015-05-01 ENCOUNTER — Encounter: Payer: Self-pay | Admitting: Radiation Oncology

## 2015-05-01 ENCOUNTER — Telehealth: Payer: Self-pay | Admitting: *Deleted

## 2015-05-01 ENCOUNTER — Ambulatory Visit
Admission: RE | Admit: 2015-05-01 | Discharge: 2015-05-01 | Disposition: A | Discharge status: Home / Self Care | Payer: BLUE CROSS/BLUE SHIELD | Source: Ambulatory Visit | Attending: Radiation Oncology | Admitting: Radiation Oncology

## 2015-05-01 ENCOUNTER — Telehealth: Payer: Self-pay | Admitting: Hematology

## 2015-05-01 ENCOUNTER — Other Ambulatory Visit: Payer: BLUE CROSS/BLUE SHIELD

## 2015-05-01 VITALS — BP 121/87 | HR 93 | Resp 16 | Wt 250.6 lb

## 2015-05-01 DIAGNOSIS — C714 Malignant neoplasm of occipital lobe: Secondary | ICD-10-CM

## 2015-05-01 NOTE — Progress Notes (Signed)
Radiation Oncology         [phone] ________________________________  Name: Jake [name] MRN: [id number]  Date: 05/01/2015  DOB: 1955-10-12  Follow-Up Visit Note  Outpatient  CC: [name], MD  [name], MD  Diagnosis and Prior Radiotherapy:    ICD-9-CM ICD-10-CM   1. 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    Reports daily headaches. Reports he has increased his intake of pain medication due to headaches. On low dose Decadron. Denies ringing in the ears. Reports loss of peripheral vision in left eye and bilateral blurry vision continue. Denies ringing in the ears. Reports fatigue is less because he is sleeping better.   Reports he is no longer taking Temodar. Explains he has been continuing on Avastin and added irenotecan. Wife explains the patient is on the short list for the trial at [facility] where "bacteria will be injected into his tumor."   ALLERGIES:  has No Known Allergies.  Meds: Current Outpatient Prescriptions  Medication Sig Dispense Refill  . bevacizumab in sodium chloride 0.9 % 100 mL Inject into the vein every 14 (fourteen) days.    . cephALEXin (KEFLEX) 500 MG capsule Take 1 capsule (500 mg total) by mouth 4 (four) times daily. 40 capsule 0  . clonazePAM (KLONOPIN) 1 MG tablet Take 1-2 tablets (1-2 mg total) by mouth at bedtime. 30 tablet 3  . dexamethasone (DECADRON) 0.5 MG tablet Take 1.5 mg daily x2weeks, then 1 mg daily x2weeks, then 0.5 mg daily x2 weeks, then taper off. 8/6-8/19 dexamethasone 1.5mg  in the am 8/20-9/2 dexamethasone 1 mg in the am 9/3-9/16 dexamethasone 0.5mg  in am 9/17-9/23 dexamethasone 0.5 mg  every other day 9/24 stop dexamethasone    . docusate sodium (COLACE) 100 MG capsule Take 100 mg by mouth 2 (two) times daily.    Marland Kitchen HYDROcodone-acetaminophen (NORCO/VICODIN) 5-325 MG per tablet Take 1 tablet by mouth every 6 (six) hours as needed for moderate pain. 30 tablet 0  . hydrocortisone (CORTEF) 10 MG tablet Take 20 mg (2 tabs) in AM, 10 mg (1 tab) in PM    . levothyroxine (SYNTHROID, LEVOTHROID) 50 MCG tablet Take 1 tablet (50 mcg total) by mouth daily before breakfast. 90 tablet 3  . lisinopril (PRINIVIL,ZESTRIL) 40 MG tablet Take 1 tablet (40 mg total) by mouth daily. 90 tablet 3  . LORazepam (ATIVAN) 0.5 MG tablet take 1 to 2 tablets by mouth every 8 hours if needed for anxiety nausea or sleep 60 tablet 0  . Multiple Vitamins-Minerals (MULTIVITAMIN WITH MINERALS) tablet Take 1 tablet by mouth daily.    Marland Kitchen NEXIUM 24HR 20 MG capsule   0  . ondansetron (ZOFRAN) 8 MG tablet Take 1 tablet (8 mg total) by mouth every 8 (eight) hours as needed for nausea or vomiting. 30 tablet 4  . Probiotic Product (PROBIOTIC DAILY PO) Take by mouth daily.     . prochlorperazine (COMPAZINE) 10 MG tablet Take 1 tablet (10 mg total) by mouth every 6 (six) hours as needed for nausea or vomiting. 60 tablet 1  . senna (SENOKOT) 8.6 MG tablet Take 1 tablet by mouth daily.    . verapamil (COVERA  HS) 180 MG (CO) 24 hr tablet Take 1 tablet (180 mg total) by mouth at bedtime. 90 tablet 3   No current facility-administered medications for this encounter.    Physical Findings: The patient is in no acute distress. Patient is alert and oriented.  weight is 250 lb 9.6 oz (113.671 kg). His blood pressure is 121/87 and his pulse is 93. His respiration is 16. .   General NAD Further physical exam deferred today, spent visit discussing plan.    Lab Findings: Lab Results  Component Value Date   WBC 7.9 04/28/2015   HGB 13.9 04/28/2015   HCT 42.5 04/28/2015   MCV 90.9 04/28/2015   PLT 156 04/28/2015    Radiographic  Findings: Mr [name] [place] Contrast  04/20/2015   ADDENDUM REPORT: 04/20/2015 08:38  ADDENDUM: Study discussed by telephone with NP Ned Card on 04/20/2015 at 0825 hours. She will forward these results to [facility] who is also following this patient.   Electronically Signed   By: Genevie Ann M.D.   On: 04/20/2015 08:38   04/20/2015   CLINICAL DATA:  59 year old male Glioblastoma of parieto-occipital region.  Indication for treatment: curative  (with Temodar) Radiation treatment dates:   08/03/2014-09/15/2014 Site/dose: 1) Right occipito-parietal brain / 46 Gy in 23 fractions2) Right brain boost / 14 Gy in 7 fractions  Progression versus pseudo progression on prior MRI. Was clinically improved in July. Restaging. Subsequent encounter.  EXAM: MRI HEAD WITHOUT AND WITH CONTRAST  TECHNIQUE: Multiplanar, multiecho pulse sequences of the brain and surrounding structures were obtained without and with intravenous contrast.  CONTRAST:  78mL MULTIHANCE GADOBENATE DIMEGLUMINE 529 MG/ML IV SOLN  COMPARISON:  03/10/2015 and earlier.  FINDINGS: Increased masslike heterogeneous T2 and FLAIR hyperintense signal in the right occipital lobe encompassing an area of 54 x 38 x 43 mm (series 6, image 13, series 7 image 13, and series 10, image 8). A component of curvilinear restricted diffusion here (series 400, image 28) suggests hypercellularity. Increased effacement of the right occipital horn. Increased mass effect on the atrium of the right lateral ventricle (series 6, image 15 versus series 6, image 14 previously). Following contrast, a pattern of nodular in petechial enhancement occurs similar in appearance to the prior study, but encompassing 33 x 36 x 42 mm now (versus 33 x 23 x 35 mm at a comparable level previously.  Overlying craniotomy changes are stable. No splenium involvement or extension of signal changes across the midline. Patchy T2 and FLAIR hyperintensity elsewhere appears stable. No other abnormal enhancement.   No restricted diffusion or evidence of acute infarction. Major intracranial vascular flow voids are stable. No ventriculomegaly. No acute intracranial hemorrhage identified. Negative pituitary, cervicomedullary junction and visualized cervical spine. Normal bone marrow signal. Mastoids are clear. Stable paranasal sinuses. Negative orbits soft tissues. Trace retained secretions in the nasopharynx. No acute scalp soft tissue findings.  IMPRESSION: 1. Progressed mass like signal abnormality in the right occipital lobe most compatible with interval true progression of disease. Patchy associated enhancement. Effacement of the right occipital horn and mild mass effect on the atrium. 2. No ventriculomegaly.  No other acute intracranial abnormality.  Electronically Signed: By: Genevie Ann M.D. On: 04/20/2015 08:17    Impression/Plan:   MRI discussed with Dr Burr Medico and at tumor board. Very Concerning for progression.  He has started irinotecan in addition to the Avastin and will reevaluate in 6 wks with MRI.  Has applied for [name] trial as above.  To keep  his options open, we are filing paperwork for Optune Novocure treatment which may be another salvage alternative to systemic therapy. He is interested but concerned about the cost, and this will be explored with the company after they evaluate his insurance.  Will followup after next MRI.  Spent 25 minutes face to face with patient, over 50% of time spent on counseling and coordination of care.  This document serves as a record of services personally performed by Eppie Gibson, MD. It was created on her behalf by Arlyce Harman, a trained medical scribe. The creation of this record is based on the scribe's personal observations and the provider's statements to them. This document has been checked and approved by the attending provider.  _____________________________________   Eppie Gibson, MD

## 2015-05-01 NOTE — Telephone Encounter (Signed)
Per staff message and POF I have scheduled appts. Advised scheduler of appts. JMW  

## 2015-05-01 NOTE — Progress Notes (Addendum)
Weight and vitals stable. Denies pain. Taking decadron .05 mg once daily. No thrush noted. Reports taking a probiotics daily. Reports taking keflex qid for exposed left jaw bone. Patient reports that Dr. Burr Medico is referring the patient to an oral surgeon. Reports daily headaches. Reports he has increased his intake of pain medication due to headaches and left jaw bone pain. Denies ringing in the ears. Reports loss of peripheral vision in left eye and bilateral blurry vision continue. Denies ringing in the ears. Reports fatigue is less because he is sleeping better. Unsteady gait noted. Reports he is no longer taking Temodar. Explains he has been started on Avastin and CPT 11. Wife explains the patient is on the short list for the trial at [facility] where "bacteria will be injected into his tumor."  BP 121/87 mmHg  Pulse 93  Resp 16  Wt 250 lb 9.6 oz (113.671 kg) Wt Readings from Last 3 Encounters:  05/01/15 250 lb 9.6 oz (113.671 kg)  04/28/15 250 lb 14.4 oz (113.807 kg)  04/27/15 251 lb (113.853 kg)

## 2015-05-01 NOTE — Telephone Encounter (Signed)
per Lattie Haw in Dr Lawana Chambers office pt in [name] is aware and will call pt

## 2015-05-01 NOTE — Telephone Encounter (Signed)
Per staff message and POF I have scheduled appts. Advised scheduler of appts and MD visit on 9/9 to late for treatment   JMW

## 2015-05-03 ENCOUNTER — Ambulatory Visit: Payer: BLUE CROSS/BLUE SHIELD | Admitting: Radiation Oncology

## 2015-05-03 NOTE — Telephone Encounter (Signed)
Voicemail from spouse [name] to call her at [phone] or Severino at [phone] with information about Dr. Burr Medico getting in touch with dentist. "We haven't heard back yet.  He has a few more days of antibiotics to take.  Bone exposed in his mouth at gum-line and very sore." Called [name].  Message left that Dr. Ritta Slot office is aware of referral and will call patient with appointment.

## 2015-05-10 ENCOUNTER — Encounter (HOSPITAL_COMMUNITY): Payer: Self-pay | Admitting: Dentistry

## 2015-05-10 ENCOUNTER — Telehealth: Payer: Self-pay | Admitting: Hematology

## 2015-05-10 ENCOUNTER — Ambulatory Visit (HOSPITAL_COMMUNITY): Payer: Self-pay | Admitting: Dentistry

## 2015-05-10 VITALS — BP 139/80 | HR 97 | Temp 98.2°F

## 2015-05-10 DIAGNOSIS — C719 Malignant neoplasm of brain, unspecified: Secondary | ICD-10-CM | POA: Diagnosis not present

## 2015-05-10 DIAGNOSIS — R29898 Other symptoms and signs involving the musculoskeletal system: Secondary | ICD-10-CM

## 2015-05-10 DIAGNOSIS — K0820 Unspecified atrophy of edentulous alveolar ridge: Secondary | ICD-10-CM

## 2015-05-10 DIAGNOSIS — K08109 Complete loss of teeth, unspecified cause, unspecified class: Secondary | ICD-10-CM

## 2015-05-10 DIAGNOSIS — Z923 Personal history of irradiation: Secondary | ICD-10-CM

## 2015-05-10 DIAGNOSIS — M270 Developmental disorders of jaws: Secondary | ICD-10-CM

## 2015-05-10 MED ORDER — CHLORHEXIDINE GLUCONATE 0.12 % MT SOLN: OROMUCOSAL | Status: AC

## 2015-05-10 NOTE — Telephone Encounter (Signed)
s.w. pt and advised on added lab.....pt ok and aware °

## 2015-05-10 NOTE — Progress Notes (Signed)
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  HPI: Jake [name] is a 59 year old male with history of glioblastoma multiforme. The patient underwent craniotomy and surgical resection of the brain tumor on 07/07/2014 with Dr. Saintclair Halsted. The patient and underwent brain radiation therapy from 08/03/2014 through 09/15/2014 with Dr. Isidore Moos. Patient has been undergoing chemotherapy with Dr.Feng and is currently having Avastin and Ironotecan chemotherapy administered on an every two-week basis. Patient developed mandibular left lingual exposed bone approximately 3-4 weeks ago. Patient has constant sharp pain that is currently 5 out of 10 in intensity and jumps to 7-8/10 in intensity whenever cold or hot liquids hit that area. Pain then returns to 5 out of 10 over a period of minutes to hours. Patient has used Vicodin 5/325 for pain. This helps for "several hours".  Patient uses salt water rinses that helps for "a little while".  Patient denies having any trauma to this area from either food or from his existing upper or lower dentures. Patient indicates that he has not worn his lower denture since they were inserted in December 2013. "I just don't wear them". Patient does not think the upper denture is biting into the lower jaw when he eats food. The patient indicates that the remaining teeth were extracted in November 2013 by Dr. Derrek Gu. Upper and lower dentures were then fabricated and inserted in December of 2013. Patient indicates that implant therapy was being considered to allow him to wear an implant-retained lower complete denture.   PROBLEM LIST: Patient Active Problem List   Diagnosis  Date Noted  . Palliative care encounter 10/04/2014  . DNR (do not resuscitate) discussion 10/04/2014  . Fatigue 10/04/2014  . Neoplastic malignant related fatigue   . Nocturnal leg cramps 09/29/2014  . Glioblastoma of occipital lobe 07/22/2014  . GBM (glioblastoma multiforme) 07/22/2014  . Glioma 07/07/2014  . Essential hypertension   . Thyroid activity decreased   . Left-sided muscle weakness   . Brain tumor 07/02/2014  . Tobacco abuse 01/31/2013  . Hypothyroidism 01/31/2013  . Hypertension 01/31/2013  . Polypharmacy 01/31/2013    PMH: Past Medical History  Diagnosis Date  . Thyroid disease   . Hypertension   . Allergy   . Arthritis   . Sleep apnea   . Kidney stone   . Glioblastoma multiforme of occipital lobe   . Nocturnal leg cramps 09/29/2014  . History of radiation therapy 08/03/14- 09/15/14    right occipito-parietal brain/46 Gy 23 fx, right brain boost/ 14 Gy in 7 fx    PSH: Past Surgical History  Procedure Laterality Date  . Knee arthroscopy w/ meniscal repair    . Treatment fistula anal    . Craniotomy N/A 07/07/2014    Procedure: CRANIOTOMY TUMOR EXCISION/CURVE;  Surgeon: Elaina Hoops, MD;  Location: [facility];  Service: Neurosurgery;  Laterality: N/A;    ALLERGIES: No Known Allergies  MEDICATIONS: Current Outpatient Prescriptions  Medication Sig Dispense Refill  . bevacizumab in sodium chloride 0.9 % 100 mL Inject into the vein every 14 (fourteen) days.    . cephALEXin (KEFLEX) 500 MG capsule Take 1 capsule (500 mg total) by mouth 4 (four) times daily. 40 capsule 0  .  clonazePAM (KLONOPIN) 1 MG tablet Take 1-2 tablets (1-2 mg total) by mouth at bedtime. 30 tablet 3  . dexamethasone (DECADRON) 0.5 MG tablet Take 1.5 mg daily x2weeks, then 1 mg daily x2weeks, then 0.5 mg daily x2 weeks, then taper off. 8/6-8/19 dexamethasone 1.5mg  in the am 8/20-9/2 dexamethasone 1 mg in the am 9/3-9/16 dexamethasone 0.5mg  in am 9/17-9/23 dexamethasone 0.5 mg every other day  9/24 stop dexamethasone    . docusate sodium (COLACE) 100 MG capsule Take 100 mg by mouth 2 (two) times daily.    Marland Kitchen HYDROcodone-acetaminophen (NORCO/VICODIN) 5-325 MG per tablet Take 1 tablet by mouth every 6 (six) hours as needed for moderate pain. 30 tablet 0  . hydrocortisone (CORTEF) 10 MG tablet Take 20 mg (2 tabs) in AM, 10 mg (1 tab) in PM    . levothyroxine (SYNTHROID, LEVOTHROID) 50 MCG tablet Take 1 tablet (50 mcg total) by mouth daily before breakfast. 90 tablet 3  . lisinopril (PRINIVIL,ZESTRIL) 40 MG tablet Take 1 tablet (40 mg total) by mouth daily. 90 tablet 3  . LORazepam (ATIVAN) 0.5 MG tablet take 1 to 2 tablets by mouth every 8 hours if needed for anxiety nausea or sleep 60 tablet 0  . Multiple Vitamins-Minerals (MULTIVITAMIN WITH MINERALS) tablet Take 1 tablet by mouth daily.    Marland Kitchen NEXIUM 24HR 20 MG capsule   0  . ondansetron (ZOFRAN) 8 MG tablet Take 1 tablet (8 mg total) by mouth every 8 (eight) hours as needed for nausea or vomiting. 30 tablet 4  . Probiotic Product (PROBIOTIC DAILY PO) Take by mouth daily.     . prochlorperazine (COMPAZINE) 10 MG tablet Take 1 tablet (10 mg total) by mouth every 6 (six) hours as needed for nausea or vomiting. 60 tablet 1  . senna (SENOKOT) 8.6 MG tablet Take 1 tablet by mouth daily.    . verapamil (COVERA HS) 180 MG (CO) 24 hr tablet Take 1 tablet (180 mg total) by mouth at bedtime. 90 tablet 3   No current facility-administered medications for this visit.    LABS: Lab Results  Component Value Date   WBC 7.9 04/28/2015   HGB 13.9 04/28/2015   HCT 42.5 04/28/2015   MCV 90.9 04/28/2015   PLT 156 04/28/2015      Component Value Date/Time   NA 139 04/28/2015 1412   NA 134* 07/08/2014 0644   K 4.3 04/28/2015 1412   K 4.7 07/08/2014 0644   CL 99 07/08/2014 0644   CO2 24 04/28/2015 1412   CO2 23 07/08/2014 0644   GLUCOSE 112 04/28/2015 1412   GLUCOSE 100* 07/08/2014 0644   BUN 15.8 04/28/2015 1412   BUN 26* 07/08/2014 0644    CREATININE 0.9 04/28/2015 1412   CREATININE 0.75 07/08/2014 0644   CREATININE 0.86 01/25/2014 1015   CALCIUM 9.4 04/28/2015 1412   CALCIUM 8.4 07/08/2014 0644   GFRNONAA >90 07/08/2014 0644   GFRAA >90 07/08/2014 0644   Lab Results  Component Value Date   INR 0.99 07/06/2014   INR 1.02 07/02/2014   No results found for: PTT  SOCIAL HISTORY: Social History   Social History  . Marital Status: Married    Spouse Name: N/A  . Number of Children: 5  . Years of Education: college   Occupational History  . Not on file.   Social History Main Topics  . Smoking status: Current Every Day Smoker -- 1.00 packs/day for 45 years    Types: Cigarettes  . Smokeless tobacco:  Never Used  . Alcohol Use: 0.0 oz/week    0 Standard drinks or equivalent per week     Comment: 2-3 beers per/day   . Drug Use: No  . Sexual Activity: Not on file   Other Topics Concern  . Not on file   Social History Narrative   Patient is right handed.   Patient drinks 2 cups daily       FAMILY HISTORY: Family History  Problem Relation Age of Onset  . Hypertension Mother   . Diabetes Father   . Glaucoma Father   . Diabetes Brother   . Diabetes Brother   . Colon cancer Neg Hx     REVIEW OF SYSTEMS: Reviewed from chart for this patient.  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  HPI: Jake [name] is a 59 year old male with history of glioblastoma multiforme. The patient underwent craniotomy and surgical resection of the brain tumor on 07/07/2014 with Dr. Saintclair Halsted. The patient and underwent brain radiation therapy from 08/03/2014 through 09/15/2014 with Dr. Isidore Moos. Patient has been undergoing chemotherapy with Dr.Feng and is currently having Avastin and Ironotecan chemotherapy administered on an every two-week basis. Patient developed mandibular left lingual exposed bone approximately 3-4  weeks ago. Patient has constant sharp pain that is currently 5 out of 10 in intensity and jumps to 7-8/10 in intensity whenever cold or hot liquids hit that area. Pain then returns to 5 out of 10 over a period of minutes to hours. Patient has used Vicodin 5/325 for pain. This helps for "several hours".  Patient uses salt water rinses that helps for "a little while".  Patient denies having any trauma to this area from either food or from his existing upper or lower dentures. Patient indicates that he has not worn his lower denture since they were inserted in December 2013. "I just don't wear them". Patient does not think the upper denture is biting into the lower jaw when he eats food. The patient indicates that the remaining teeth were extracted in November 2013 by Dr. Derrek Gu. Upper and lower dentures were then fabricated and inserted in December of 2013. Patient indicates that implant therapy was being considered to allow him to wear an implant-retained lower complete denture.  DENTAL EXAMINATION: GENERAL: The patient is a well-developed, well-nourished male in no acute distress.  HEAD AND NECK: There is no palpable submandibular lymphadenopathy. The patient denies acute TMJ symptoms.  INTRAORAL EXAM: The patient has normal saliva.  The patient has bilateral mandibular lingual tori. There is a 12 x 3 mm area of exposed bone involving the mandibular left lingual alveolar ridge area #17 thru #18. The exposed bone appears to be present as a bony sequestrum. This bony sequestrum was able to be removed with a soft tissue pliers without complications and minimal heme. Exposed bone is still noted underneath this bone specimen, but the bone is now under the level of the gum tissue.  DENTITION: The patient is edentulous. There is atrophy of the mandibular alveolar ridge. PROSTHODONTIC: The patient has an upper and lower complete denture. The patient did not bring the lower denture with him today. The upper  denture is clinically stable and retentive and has good aesthetics. The patient no longer wears the lower denture. OCCLUSION:I am unable to assess the occlusion of the dentures without the lower denture.   RADIOGRAPHIC INTERPRETATION: Orthopantogram was taken today. The patient is edentulous. There  is some atrophy of the mandibular alveolar ridge. Bilateral maxillary sinuses are noted and are well aerated.  ASSESSMENTS: 1. History of glioblastoma multiforme with surgical resection on 07/07/2014. 2. Status post brain radiation therapy from 08/03/2014 through 09/15/2014 3. Active chemotherapy with Avastin and Ironotecan 4. The patient is edentulous 5. 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 Mandibular left lingual exposed bone in the area #17 and 18 8. Status post removal of bony sequestrum measuring 12 x 3 mm. 9. History of upper lower complete dentures but the patient only wears the upper denture.  10. History of Avastin chemotherapy with the risk for bleeding, delayed healing, and drug-induced osteonecrosis of the jaw.  PLAN/RECOMMENDATIONS: 1. I discussed the risks, benefits, and complications of various treatment options with the patient in relationship to his medical and dental conditions, active chemotherapy, and risk for bleeding, delayed healing, and osteonecrosis of the jaw.  We discussed various treatment options to include no treatment,  partial ostectomy and bony reshaping of the mandibular left lingual alveolar bone, future mandibular tori reductions, implant therapy, and fabrication of new lower denture as indicated. We also discussed utilizing chlorhexidine 0.12% oral rinses 3 times a day to assist in disinfection of the oral cavity at this time. The patient currently wishes to proceed with the use of the chlorhexidine rinses. Patient agrees to return to dental medicine in approximately 2 weeks for evaluation of healing and to determine if  further sequestrectomy procedure is needed at that time.  We will coordinate obtaining CBC from the [facility] prior to any dental surgery and will have a discussion with Dr. Burr Medico to ensure the medical ability/stability of the patient to proceed with dental procedures at this time. We will also discuss the ability to discontinue Avastin therapy, however, most likely this will not be able to be done at this time. Patient is to continue keeping lower denture out of his mouth. Patient was also cautioned about causing trauma from his upper denture to his lower ridge with chewing of his food. Patient is to avoid any additional pre-prosthetic surgery or implant therapy while under chemotherapy with Avastin or other chemotherapeutic agent that puts the patient at risk for further delayed healing or osteonecrosis of the jaw.  2. Discussion of findings with medical team and coordination of future medical and dental care as needed.  I spent in excess of  120 minutes during the conduct of this consultation and >50% of this time involved direct face-to-face encounter for counseling and/or coordination of the patient's care.    Lenn Cal, DDS

## 2015-05-10 NOTE — Patient Instructions (Addendum)
The patient is to rinses mouth 3 times daily after breakfast, lunch, at bedtime with 15 ML's of chlorhexidine gluconate 0.12% oral rinse. The patient is using rinse in a swish and spit manner and do not swallow. The patient is to keep lower denture out at this time. Patient is to avoid trauma to the mandibular left lingual area with chewing. Patient to return to dental medicine in approximately 2 weeks for evaluation of healing and to consider additional partial ostectomy/sequestrectomy at that time. Patient is to obtain CBC from the [facility] prior to the dental medicine procedure for 05/25/2015 at 1 PM.  Lenn Cal, DDS

## 2015-05-12 ENCOUNTER — Ambulatory Visit (HOSPITAL_BASED_OUTPATIENT_CLINIC_OR_DEPARTMENT_OTHER): Payer: BLUE CROSS/BLUE SHIELD

## 2015-05-12 ENCOUNTER — Other Ambulatory Visit (HOSPITAL_BASED_OUTPATIENT_CLINIC_OR_DEPARTMENT_OTHER): Payer: BLUE CROSS/BLUE SHIELD

## 2015-05-12 ENCOUNTER — Ambulatory Visit (HOSPITAL_BASED_OUTPATIENT_CLINIC_OR_DEPARTMENT_OTHER): Payer: BLUE CROSS/BLUE SHIELD | Admitting: Hematology

## 2015-05-12 ENCOUNTER — Encounter: Payer: Self-pay | Admitting: Hematology

## 2015-05-12 ENCOUNTER — Ambulatory Visit: Payer: BLUE CROSS/BLUE SHIELD | Admitting: Hematology

## 2015-05-12 ENCOUNTER — Other Ambulatory Visit: Payer: BLUE CROSS/BLUE SHIELD

## 2015-05-12 VITALS — BP 127/71 | HR 103 | Temp 97.6°F | Resp 19 | Ht 71.0 in | Wt 250.1 lb

## 2015-05-12 VITALS — BP 124/79 | HR 75

## 2015-05-12 DIAGNOSIS — C714 Malignant neoplasm of occipital lobe: Secondary | ICD-10-CM

## 2015-05-12 DIAGNOSIS — Z5112 Encounter for antineoplastic immunotherapy: Secondary | ICD-10-CM | POA: Diagnosis not present

## 2015-05-12 DIAGNOSIS — C713 Malignant neoplasm of parietal lobe: Secondary | ICD-10-CM | POA: Diagnosis not present

## 2015-05-12 DIAGNOSIS — R630 Anorexia: Secondary | ICD-10-CM | POA: Diagnosis not present

## 2015-05-12 DIAGNOSIS — R5383 Other fatigue: Secondary | ICD-10-CM | POA: Diagnosis not present

## 2015-05-12 DIAGNOSIS — Z5111 Encounter for antineoplastic chemotherapy: Secondary | ICD-10-CM | POA: Diagnosis not present

## 2015-05-12 DIAGNOSIS — R110 Nausea: Secondary | ICD-10-CM

## 2015-05-12 DIAGNOSIS — C719 Malignant neoplasm of brain, unspecified: Secondary | ICD-10-CM

## 2015-05-12 DIAGNOSIS — I10 Essential (primary) hypertension: Secondary | ICD-10-CM

## 2015-05-12 DIAGNOSIS — E039 Hypothyroidism, unspecified: Secondary | ICD-10-CM

## 2015-05-12 LAB — COMPREHENSIVE METABOLIC PANEL (CC13)
ALT: 30 U/L (ref 0–55)
ANION GAP: 9 meq/L (ref 3–11)
AST: 25 U/L (ref 5–34)
Albumin: 3.1 g/dL — ABNORMAL LOW (ref 3.5–5.0)
Alkaline Phosphatase: 70 U/L (ref 40–150)
BUN: 10 mg/dL (ref 7.0–26.0)
CALCIUM: 9.6 mg/dL (ref 8.4–10.4)
CHLORIDE: 106 meq/L (ref 98–109)
CO2: 23 mEq/L (ref 22–29)
Creatinine: 0.9 mg/dL (ref 0.7–1.3)
EGFR: 89 mL/min/{1.73_m2} — AB (ref >90)
Glucose: 146 mg/dl — ABNORMAL HIGH (ref 70–140)
POTASSIUM: 3.6 meq/L (ref 3.5–5.1)
Sodium: 138 mEq/L (ref 136–145)
Total Bilirubin: 0.54 mg/dL (ref 0.20–1.20)
Total Protein: 6.2 g/dL — ABNORMAL LOW (ref 6.4–8.3)

## 2015-05-12 LAB — CBC WITH DIFFERENTIAL/PLATELET
BASO%: 0.9 % (ref 0.0–2.0)
BASOS ABS: 0 10*3/uL (ref 0.0–0.1)
EOS%: 2.5 % (ref 0.0–7.0)
Eosinophils Absolute: 0.1 10*3/uL (ref 0.0–0.5)
HEMATOCRIT: 40 % (ref 38.4–49.9)
HGB: 13.2 g/dL (ref 13.0–17.1)
LYMPH#: 1.3 10*3/uL (ref 0.9–3.3)
LYMPH%: 25.6 % (ref 14.0–49.0)
MCH: 29.7 pg (ref 27.2–33.4)
MCHC: 33 g/dL (ref 32.0–36.0)
MCV: 89.9 fL (ref 79.3–98.0)
MONO#: 0.5 10*3/uL (ref 0.1–0.9)
MONO%: 9.8 % (ref 0.0–14.0)
NEUT#: 3.1 10*3/uL (ref 1.5–6.5)
NEUT%: 61.2 % (ref 39.0–75.0)
Platelets: 206 10*3/uL (ref 140–400)
RBC: 4.45 10*6/uL (ref 4.20–5.82)
RDW: 18.9 % — ABNORMAL HIGH (ref 11.0–14.6)
WBC: 5 10*3/uL (ref 4.0–10.3)

## 2015-05-12 LAB — UA PROTEIN, DIPSTICK - CHCC: Protein, ur: <30 mg/dL

## 2015-05-12 MED ORDER — SODIUM CHLORIDE 0.9 % IV SOLN
9.6000 mg/kg | Freq: Once | INTRAVENOUS | Status: AC
  Administered 2015-05-12: 1100 mg via INTRAVENOUS
  Filled 2015-05-12: qty 36

## 2015-05-12 MED ORDER — SODIUM CHLORIDE 0.9 % IV SOLN
Freq: Once | INTRAVENOUS | Status: AC
  Administered 2015-05-12: 15:00:00 via INTRAVENOUS

## 2015-05-12 MED ORDER — SODIUM CHLORIDE 0.9 % IV SOLN
Freq: Once | INTRAVENOUS | Status: AC
  Administered 2015-05-12: 15:00:00 via INTRAVENOUS
  Filled 2015-05-12: qty 8

## 2015-05-12 MED ORDER — IRINOTECAN HCL CHEMO INJECTION 100 MG/5ML
125.5000 mg/m2 | Freq: Once | INTRAVENOUS | Status: AC
  Administered 2015-05-12: 300 mg via INTRAVENOUS
  Filled 2015-05-12: qty 15

## 2015-05-12 NOTE — Progress Notes (Signed)
[facility] FOLLOW UP NOTE  Patient Care Team: [name], MD as PCP - General (Family Medicine) Kary Kos, MD as Consulting Physician (Neurosurgery) Eppie Gibson, MD as Attending Physician (Radiation Oncology) Truitt Merle, MD as Consulting Physician (Hematology)     Glioblastoma of occipital lobe   07/02/2014 Imaging Brain MRI: Two adjacent enhancing mass lesions in the right parietal lobe with central necrosis and extensive surrounding edema and mass effect.  7 mm midline shift to the left.  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   CT CAP(-)   07/07/2014 Surgery Subtotal right parietal tumors resection    08/03/2014 Concurrent Chemotherapy Concurrent radiation with Temodar 75 mg/m daily, completed on 09/15/2014.    09/29/2014 - 12/28/2014 Chemotherapy Adjuvant Temodar, 150-200mg /m2 for 5 days, every 28 day cycle, s/p 3 cycles, stopped due to disease progression.   10/14/2014 Imaging Two discrete foci of enhancement in the RIGHT occipital lobe surround the larger resection cavity, and are suspected to represent foci of residual tumor/GBM displaying interval growt   12/13/2014 Imaging Increased size/ enhancement of 2 lesions adjacent to the right occipital resection cavity, concerning for growth of residual tumor.   01/06/2015 -  Chemotherapy Bevacizumab 10mg /kg every 2 weeks    Progression     GBM (glioblastoma multiforme)   07/22/2014 Initial Diagnosis GBM (glioblastoma multiforme)    CURRENT THERAPY: Bevacizumab 10 mg/kg every 2 weeks, started on 01/06/2059 dexa 4mg  daily until 6/10, changed to 4mg  and 2mg  daily alternatively, then 2mg  daily from 7/1, then 2mg  daily 2 days on, 1 day off from 7/25, hydrocortisone was added onto her steroids tapering by [name] in early August.  CHIEF COMPLAINTS: follow up GBM   HISTORY OF INITIAL PRESENTATION;   He initially presented was headaches and sinus congestion in mid October 2015. He was seen by his primary care  physician and was treated for sinus infection. He subsequently developed left sided visual deficit, left arm and leg weakness and unsteady gait. He was sent to emergency room by his primary care physician on 07/02/2014. CT  And MRI of brain reviewed to add adjacent enhancing mass lesions in the right parietal lobe with central necrosis and extensive surrounding edema and mass effect. He was admitted and underwent right craniotomy  For resection of the 2 brain masses by Dr. Lonell Grandchild. Postsurgical brain MRI revealed a 1.3 cm area of enhancement at the anterior margin of surgical resection cavity, likely surgical change. His case was reviewed in the tumor board and it was felt he had subtotal resection.  INTERVAL HISTROY: [name] for follow-up. He tolerated the first dose irinotecan well, no noticeable  Side effects. He was also seen by a dentist Dr. Enrique Sack,  And will follow-up next week. He is continuing tapering off the steroids, and will be likely off in 2 weeks. His symptoms is stable, no no symptoms.  His energy level and appetite remains moderate and stable.   MEDICAL HISTORY:  Past Medical History  Diagnosis Date  . Thyroid disease   . Hypertension   . Allergy   . Arthritis   .    Marland Kitchen Kidney stone     SURGICAL HISTORY: Past Surgical History  Procedure Laterality Date  . Knee arthroscopy w/ meniscal repair    . Treatment fistula anal    . 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 Marital Status: Married    Spouse Name: N/A    Number of Children: 5 children, age of 55-21  . Years of Education: N/A   Occupational History  . 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    Social History Main Topics  . Smoking status: Current Every Day Smoker -- 1.00 packs/day for 45 years     Types: Cigarettes  . Smokeless tobacco: Never Used  . Alcohol Use: Yes     Comment: 2-3 beers per/day   . Drug Use: No  . Sexual Activity: Not on file     FAMILY HISTORY: Family History  Problem Relation Age of Onset  . Hypertension Mother   . Diabetes Father   . Glaucoma Father   . Diabetes Brother   . Diabetes Brother   . Colon cancer Neg Hx   Paternal ancle had prostate cancer. No other family history of malignancy.  ALLERGIES:  has No Known Allergies.   MEDICATIONS:  Current Outpatient Prescriptions  Medication Sig Dispense Refill  . bevacizumab in sodium chloride 0.9 % 100 mL Inject into the vein every 14 (fourteen) days.    . chlorhexidine (PERIDEX) 0.12 % solution Rinse with 15 mls three daily for 30 seconds. Use after breakfast, lunch and at bedtime. Spit out excess. Do not swallow. 1440 mL prn  . clonazePAM (KLONOPIN) 1 MG tablet Take 1-2 tablets (1-2 mg total) by mouth at bedtime. 30 tablet 3  . dexamethasone (DECADRON) 0.5 MG tablet Take 1.5 mg daily x2weeks, then 1 mg daily x2weeks, then 0.5 mg daily x2 weeks, then taper off. 8/6-8/19 dexamethasone 1.5mg  in the am 8/20-9/2 dexamethasone 1 mg in the am 9/3-9/16 dexamethasone 0.5mg  in am 9/17-9/23 dexamethasone 0.5 mg every other day 9/24 stop dexamethasone    . docusate sodium (COLACE) 100 MG capsule Take 100 mg by mouth 2 (two) times daily.    Marland Kitchen HYDROcodone-acetaminophen (NORCO/VICODIN) 5-325 MG per tablet Take 1 tablet by mouth every 6 (six) hours as needed for moderate pain. 30 tablet 0  . hydrocortisone (CORTEF) 10 MG tablet Take 20 mg (2 tabs) in AM, 10 mg (1 tab) in PM    . levothyroxine (SYNTHROID, LEVOTHROID) 50 MCG tablet Take 1 tablet (50 mcg total) by mouth daily before breakfast. 90 tablet 3  . lisinopril (PRINIVIL,ZESTRIL) 40 MG tablet Take 1 tablet (40 mg total) by mouth daily. 90 tablet 3  . LORazepam (ATIVAN) 0.5 MG tablet take 1 to 2 tablets by mouth every 8 hours if needed for anxiety nausea or sleep 60  tablet 0  . Multiple Vitamins-Minerals (MULTIVITAMIN WITH MINERALS) tablet Take 1 tablet by mouth daily.    Marland Kitchen NEXIUM 24HR 20 MG capsule   0  . ondansetron (ZOFRAN) 8 MG tablet Take 1 tablet (8 mg total) by mouth every 8 (eight) hours as needed for nausea or vomiting. 30 tablet 4  . Probiotic Product (PROBIOTIC DAILY PO) Take by mouth daily.     . prochlorperazine (COMPAZINE) 10 MG tablet Take 1 tablet (10 mg total) by mouth every 6 (six) hours as needed for nausea or vomiting. 60 tablet 1  . verapamil (COVERA HS) 180 MG (CO) 24 hr tablet Take 1 tablet (180 mg total) by mouth at bedtime. 90 tablet 3  . senna (SENOKOT) 8.6 MG tablet Take 1 tablet by mouth daily.     No current facility-administered medications for this visit.  REVIEW OF SYSTEMS:   Constitutional: Denies fevers, chills or abnormal night sweats Eyes: Denies blurriness of vision, double vision or watery eyes Ears, nose, mouth, throat, and face: Denies mucositis or sore throat Respiratory: Denies cough, dyspnea or wheezes Cardiovascular: Denies palpitation, chest discomfort or lower extremity swelling Gastrointestinal:  Denies nausea, heartburn or change in bowel habits Skin: Denies abnormal skin rashes Lymphatics: Denies new lymphadenopathy or easy bruising Neurological:positive for mild headaches, and left side vision deficits. His left-sided weakness has nearly resolved. Denies numbness, tingling or new weaknesses Behavioral/Psych: Mood is stable, no new changes  All other systems were reviewed with the patient and are negative.  PHYSICAL EXAMINATION: ECOG PERFORMANCE STATUS: 1 - Symptomatic but completely ambulatory  KPS: 80%  Filed Vitals:   05/12/15 1337  BP: 127/71  Pulse: 103  Temp: 97.6 F (36.4 C)  Resp: 19   Filed Weights   05/12/15 1337  Weight: 250 lb 1.6 oz (113.445 kg)    GENERAL:alert, no distress and comfortable SKIN: skin color, texture, turgor are normal, no rashes or significant  lesions HEAD: right-sided posterior craniotomy surgical wound is healing well, no surrounding edema, skin redness or discharge EYES: normal, conjunctiva are pink and non-injected, sclera clear OROPHARYNX:no exudate, no erythema and lips, buccal mucosa, and tongue normal  NECK: supple, thyroid normal size, non-tender, without nodularity LYMPH:  no palpable lymphadenopathy in the cervical, axillary or inguinal LUNGS: clear to auscultation and percussion with normal breathing effort HEART: regular rate & rhythm and no murmurs and no lower extremity edema ABDOMEN:abdomen soft, non-tender and normal bowel sounds Musculoskeletal:no cyanosis of digits and no clubbing  PSYCH: alert & oriented x 3 with fluent speech NEURO: cranial nerve 2-12 are intact. Left side vision deficits is present in the left upper and mid quadrant. Motor and sensitivity are normal and symmetric on all extremities. Speech is fluent, coordination is grossly intact. Ambulates independently and Lutricia Feil sign negative. EXT: Bilateral pitting edema in ankles, better than before   LABORATORY DATA:  CBC Latest Ref Rng 05/12/2015 04/28/2015 04/14/2015  WBC 4.0 - 10.3 10e3/uL 5.0 7.9 8.7  Hemoglobin 13.0 - 17.1 g/dL 13.2 13.9 13.4  Hematocrit 38.4 - 49.9 % 40.0 42.5 40.6  Platelets 140 - 400 10e3/uL 206 156 180    CMP Latest Ref Rng 05/12/2015 04/28/2015 04/14/2015  Glucose 70 - 140 mg/dl 146(H) 112 123  BUN 7.0 - 26.0 mg/dL 10.0 15.8 19.0  Creatinine 0.7 - 1.3 mg/dL 0.9 0.9 1.0  Sodium 136 - 145 mEq/L 138 139 138  Potassium 3.5 - 5.1 mEq/L 3.6 4.3 3.6  Chloride 96 - 112 mEq/L - - -  CO2 22 - 29 mEq/L 23 24 22   Calcium 8.4 - 10.4 mg/dL 9.6 9.4 9.3  Total Protein 6.4 - 8.3 g/dL 6.2(L) 6.1(L) 6.1(L)  Total Bilirubin 0.20 - 1.20 mg/dL 0.54 0.55 0.52  Alkaline Phos 40 - 150 U/L 70 68 64  AST 5 - 34 U/L 25 25 22   ALT 0 - 55 U/L 30 33 32     Surgical path 07/07/2014  Diagnosis 1. Brain, biopsy - GLIOBLASTOMA MULTIFORME, WHO GRADE  IV/IV. - SEE ONCOLOGY TABLE BELOW. 2. Brain, for tumor resection, higher right parietal - GLIOBLASTOMA MULTIFORME, WHO GRADE IV/IV. - SEE ONCOLOGY TABLE BELOW. 3. Brain, for tumor resection, lower right occipital - GLIOBLASTOMA MULTIFORME, WHO GRADE IV/IV   Microscopic Comment 1. -3. ONCOLOGY TABLE - BRAIN AND SPINAL CORD 1. Procedure: Resection x2 2. Tumor site, including laterality: Right parietal and right  occipital 3. Maximum tumor size (cm): At least 1.8 cm 4. Histologic type: Glioblastoma multiforme 5. Grade: WHO Grade IV/IV 6. Margins (if applicable): Can not be assessed 7. Ancillary studies: Can be performed upon clinician request.  RADIOGRAPHIC STUDIES: I have personally reviewed the radiological images as listed and agreed with the findings in the report.  Mr [name] Wo Contrast 04/20/2015 IMPRESSION: 1. Progressed mass like signal abnormality in the right occipital lobe most compatible with interval true progression of disease. Patchy associated enhancement. Effacement of the right occipital horn and mild mass effect on the atrium. 2. No ventriculomegaly. No other acute intracranial abnormality.  ASSESSMENT & PLAN:  Mr. Pasillas is a 59 year old gentleman with newly diagnosed right parietal GBM, status post subtotal resection.   1. GBM, s/p subtotal resection, and concurrent chemoRT completed on 09/15/2014, progressed on adjuvant Temodar.  -I reviewed his restaging brain MRI from 4.12.16,  previous 2 small foci of enhancement around the surgical site has significantly increased on the repeated ischemic. This was reviewed in our brain tumor board this morning, and we agreed this is tumor for progression. Unfortunately, due to the position of this 2 lesions, subtotal resection are unlikely and surgery is not offered -We discussed systemic therapy options. I recommend second line Avastin. He progressed through Temodar, I'll stop Temodar. -His recent restaging brain MRI from  04/20/15 unfortunately showed disease progression. This was reviewed with patient and his wife. I also discussed with neuro oncologist Dr. Imagene Riches and she recommended adding irinotecan to avastin, every 2 weeks. She may have a clinical trial option for her in Nov 2016  - she tolerated the first dose irinotecan well,  Lab reviewed, adequate for treatment, we'll proceed with second dose,  And Avastin today. -She will continue tapering steroids per [name]'s recommendation -cont Nexium daily when he is on steroids, vitamin the vitamin D and calcium, and regular exercise.  2. Anorexia, fatigue, nausea -Improved -Follow up with dietitian. Use Zofran as needed.  3. HTN and hypothyroidism  -continue follow up with PCP -TSH normal on 03/03/2015  4. Dental issue - he will  follow-up with Dr. Enrique Sack next week   Plan: -second dose Irinotecan and Avast today and continue every 2 weeks  -I will see him back in 2 weeks  -repeat brain MRI in 3 weeks   All questions were answered. The patient knows to call the clinic with any problems, questions or concerns.  I spent 20 minutes counseling the patient face to face. The total time spent in the appointmen Your K willt was 25 minutes and more than 50% was on counseling.     Truitt Merle, MD 05/12/2015 2:27 PM

## 2015-05-12 NOTE — Patient Instructions (Signed)
[name] Discharge Instructions for Patients Receiving Chemotherapy  Today you received the following chemotherapy agents avastin, irinotecan  To help prevent nausea and vomiting after your treatment, we encourage you to take your nausea medication   If you develop nausea and vomiting that is not controlled by your nausea medication, call the clinic.   BELOW ARE SYMPTOMS THAT SHOULD BE REPORTED IMMEDIATELY:  *FEVER GREATER THAN 100.5 F  *CHILLS WITH OR WITHOUT FEVER  NAUSEA AND VOMITING THAT IS NOT CONTROLLED WITH YOUR NAUSEA MEDICATION  *UNUSUAL SHORTNESS OF BREATH  *UNUSUAL BRUISING OR BLEEDING  TENDERNESS IN MOUTH AND THROAT WITH OR WITHOUT PRESENCE OF ULCERS  *URINARY PROBLEMS  *BOWEL PROBLEMS  UNUSUAL RASH Items with * indicate a potential emergency and should be followed up as soon as possible.  Feel free to call the clinic you have any questions or concerns. The clinic phone number is [phone].  Please show the [name] at check-in to the Emergency Department and triage nurse.

## 2015-05-15 ENCOUNTER — Ambulatory Visit: Payer: BLUE CROSS/BLUE SHIELD | Admitting: Radiation Oncology

## 2015-05-25 ENCOUNTER — Encounter (HOSPITAL_COMMUNITY): Payer: Self-pay | Admitting: Dentistry

## 2015-05-25 ENCOUNTER — Other Ambulatory Visit (HOSPITAL_BASED_OUTPATIENT_CLINIC_OR_DEPARTMENT_OTHER): Payer: BLUE CROSS/BLUE SHIELD

## 2015-05-25 ENCOUNTER — Ambulatory Visit (HOSPITAL_COMMUNITY): Payer: Self-pay | Admitting: Dentistry

## 2015-05-25 VITALS — BP 130/67 | HR 92 | Temp 97.7°F

## 2015-05-25 DIAGNOSIS — C719 Malignant neoplasm of brain, unspecified: Secondary | ICD-10-CM | POA: Diagnosis not present

## 2015-05-25 DIAGNOSIS — K0820 Unspecified atrophy of edentulous alveolar ridge: Secondary | ICD-10-CM

## 2015-05-25 DIAGNOSIS — M898X9 Other specified disorders of bone, unspecified site: Secondary | ICD-10-CM | POA: Diagnosis not present

## 2015-05-25 DIAGNOSIS — R29898 Other symptoms and signs involving the musculoskeletal system: Secondary | ICD-10-CM

## 2015-05-25 DIAGNOSIS — Z923 Personal history of irradiation: Secondary | ICD-10-CM

## 2015-05-25 DIAGNOSIS — K08109 Complete loss of teeth, unspecified cause, unspecified class: Secondary | ICD-10-CM

## 2015-05-25 DIAGNOSIS — M272 Inflammatory conditions of jaws: Secondary | ICD-10-CM | POA: Diagnosis not present

## 2015-05-25 LAB — CBC WITH DIFFERENTIAL/PLATELET
BASO%: 0.8 % (ref 0.0–2.0)
BASOS ABS: 0 10*3/uL (ref 0.0–0.1)
EOS%: 1 % (ref 0.0–7.0)
Eosinophils Absolute: 0.1 10*3/uL (ref 0.0–0.5)
HEMATOCRIT: 39.8 % (ref 38.4–49.9)
HGB: 13.2 g/dL (ref 13.0–17.1)
LYMPH#: 1.5 10*3/uL (ref 0.9–3.3)
LYMPH%: 25.3 % (ref 14.0–49.0)
MCH: 30.1 pg (ref 27.2–33.4)
MCHC: 33.1 g/dL (ref 32.0–36.0)
MCV: 90.7 fL (ref 79.3–98.0)
MONO#: 0.6 10*3/uL (ref 0.1–0.9)
MONO%: 10.6 % (ref 0.0–14.0)
NEUT#: 3.7 10*3/uL (ref 1.5–6.5)
NEUT%: 62.3 % (ref 39.0–75.0)
PLATELETS: 234 10*3/uL (ref 140–400)
RBC: 4.39 10*6/uL (ref 4.20–5.82)
RDW: 19.2 % — ABNORMAL HIGH (ref 11.0–14.6)
WBC: 5.9 10*3/uL (ref 4.0–10.3)

## 2015-05-25 LAB — COMPREHENSIVE METABOLIC PANEL (CC13)
ALT: 35 U/L (ref 0–55)
ANION GAP: 8 meq/L (ref 3–11)
AST: 27 U/L (ref 5–34)
Albumin: 3.2 g/dL — ABNORMAL LOW (ref 3.5–5.0)
Alkaline Phosphatase: 72 U/L (ref 40–150)
BUN: 12.5 mg/dL (ref 7.0–26.0)
CALCIUM: 9.5 mg/dL (ref 8.4–10.4)
CHLORIDE: 107 meq/L (ref 98–109)
CO2: 25 mEq/L (ref 22–29)
Creatinine: 1 mg/dL (ref 0.7–1.3)
EGFR: 87 mL/min/{1.73_m2} — AB (ref >90)
Glucose: 95 mg/dl (ref 70–140)
POTASSIUM: 3.9 meq/L (ref 3.5–5.1)
Sodium: 140 mEq/L (ref 136–145)
Total Bilirubin: 0.37 mg/dL (ref 0.20–1.20)
Total Protein: 6 g/dL — ABNORMAL LOW (ref 6.4–8.3)

## 2015-05-25 NOTE — Patient Instructions (Addendum)
Plan/Recommendations: 1. Continue chlorhexidine rinses 3 times daily as prescribed. 2. Brush tongue 2-3 times a day. 3. Return to clinic in 2 weeks for reevaluation of healing and additional partial ostectomy as indicated. 4. Obtain CBC from cancer Center at approximately 12:30 PM on 06/08/2015 to allow for dental procedure at 1 PM. 5. Call if problems arise before then. 6. Discussion of findings with medical team and coordination of future medical and dental care as needed.    Lenn Cal, DDS

## 2015-05-25 NOTE — Progress Notes (Signed)
Limited oral examination   Date:    05/25/2015 Patient Name:   Jake [name] Date of Birth:   Apr 29, 1956 Medical Record Number: [id number]  VITALS: BP 130/67 mmHg  Pulse 92  Temp(Src) 97.7 F (36.5 C) (Oral)  HPI: Kaoru Rezendes is a 59 year old male with history of glioblastoma multiforme. The patient underwent craniotomy and surgical resection of the brain tumor on 07/07/2014 with Dr. Saintclair Halsted. The patient then underwent brain radiation therapy from 08/03/2014 through 09/15/2014 with Dr. Isidore Moos. Patient has been undergoing chemotherapy with Dr.Feng and is currently having Avastin and Ironotecan chemotherapy administered on an every two-week basis. Patient developed mandibular left lingual exposed bone approximately 5-6 weeks ago. The patient was examined on 05/10/2015 and found to have a 12 x 3 mm area of exposed bone involving the mandibular left lingual alveolar ridge area #17 thru #18. A bony sequestrum was noted at that time. The bony sequestrum was able to be removed with a soft tissue pliers without complications and minimal heme. There was some persistent exposed bone still present. Patient was then placed on chlorhexidine rinses to use and a 3 times a day basis and a follow-up appointment was scheduled for today. The patient now presents for reevaluation of healing and additional partial ostectomy as indicated. A CBC was obtained today and results are listed below. There is no evidence of thrombocytopenia.    Lab Results  Component Value Date   WBC 5.9 05/25/2015   HGB 13.2 05/25/2015   HCT 39.8 05/25/2015   MCV 90.7 05/25/2015   PLT 234 05/25/2015    SUBJECTIVE: Patient indicates that the discomfort is much better. Patient indicates that the lower left area is "still sore". The patient is able to feel the exposed bone with his tongue by report. Patient is using the chlorhexidine rinses 3 times daily. Patient is brushing his tongue daily.  OBJECTIVE: There is a 2 x 7 mm area of  exposed bone. There is a loose bony sequestrum again noted.  ASSESSMENT: 1. History of glioblastoma multiforme with surgical resection on 07/07/2014. 2. Status post brain radiation therapy from 08/03/2014 through 09/15/2014 3. Active chemotherapy with Avastin and Ironotecan 4. The patient is edentulous 5. 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 Persistent mandibular left lingual exposed bone in the area #17 and 18 now measuring 2 x 7 mm 8. Status post removal of bony sequestrum measuring 12 x 3 mm on 05/10/15 9. History of upper and lower complete dentures but the patient only wears the upper denture.  10. History of Avastin chemotherapy with the risk for bleeding, delayed healing, and drug-induced osteonecrosis of the jaw.  PLAN/RECOMMENDATIONS: 1. I discussed the risks, benefits, and complications of various treatment options with the patient in relationship to his medical and dental conditions, active chemotherapy, and risk for bleeding, delayed healing, and osteonecrosis of the jaw.  We discussed various treatment options to include no treatment versus partial ostectomy and bony reshaping of the mandibular left lingual alveolar bone. We also discussed continued utilization of chlorhexidine 0.12% oral rinses 3 times a day to assist in disinfection of the oral cavity at this time. The patient currently wishes to proceed with the partial ostectomy procedure and use of the chlorhexidine rinses.   Procedure: Patient was prepped and draped for the partial ostectomy procedure. Topical local and aesthetic was applied to the lower left lingual area. A 2 x 7 mm area of exposed bone was removed. A rongeur, bone file, and surgical  curette was utilized to remove further bone in the area to hopefully allow the tissues to cover the surgical defect in the future. Minimal heme was noted. Patient was given a pre-and postoperative rinse with chlorhexidine 0.12% rinse. The  patient tolerated the procedure well.  Plan/Recommendations: 1. Continue chlorhexidine rinses 3 times daily as prescribed. 2. Brush tongue 2-3 times a day. 3. Return to clinic in 2 weeks for reevaluation of healing and additional partial ostectomy as indicated. 4. Obtain CBC from cancer Center at approximately 12:30 PM on 06/08/2015 to allow for dental procedure at 1 PM. 5. Call problems arise before then. 6. Discussion of findings with medical team and coordination of future medical and dental care as needed.    Lenn Cal, DDS

## 2015-05-26 ENCOUNTER — Ambulatory Visit (HOSPITAL_BASED_OUTPATIENT_CLINIC_OR_DEPARTMENT_OTHER): Payer: BLUE CROSS/BLUE SHIELD | Admitting: Hematology

## 2015-05-26 ENCOUNTER — Encounter: Payer: Self-pay | Admitting: Hematology

## 2015-05-26 ENCOUNTER — Ambulatory Visit (HOSPITAL_BASED_OUTPATIENT_CLINIC_OR_DEPARTMENT_OTHER): Payer: BLUE CROSS/BLUE SHIELD

## 2015-05-26 ENCOUNTER — Other Ambulatory Visit: Payer: BLUE CROSS/BLUE SHIELD

## 2015-05-26 VITALS — BP 138/74 | HR 86 | Temp 97.7°F | Resp 19 | Ht 71.0 in | Wt 245.9 lb

## 2015-05-26 DIAGNOSIS — Z5112 Encounter for antineoplastic immunotherapy: Secondary | ICD-10-CM

## 2015-05-26 DIAGNOSIS — C714 Malignant neoplasm of occipital lobe: Secondary | ICD-10-CM

## 2015-05-26 DIAGNOSIS — Z5111 Encounter for antineoplastic chemotherapy: Secondary | ICD-10-CM

## 2015-05-26 DIAGNOSIS — C713 Malignant neoplasm of parietal lobe: Secondary | ICD-10-CM | POA: Diagnosis not present

## 2015-05-26 MED ORDER — ATROPINE SULFATE 1 MG/ML IJ SOLN
1.0000 mg | Freq: Once | INTRAMUSCULAR | Status: AC | PRN
  Administered 2015-05-26: 1 mg via INTRAVENOUS

## 2015-05-26 MED ORDER — SODIUM CHLORIDE 0.9 % IV SOLN
9.7000 mg/kg | Freq: Once | INTRAVENOUS | Status: AC
  Administered 2015-05-26: 1100 mg via INTRAVENOUS
  Filled 2015-05-26: qty 36

## 2015-05-26 MED ORDER — SODIUM CHLORIDE 0.9 % IV SOLN
Freq: Once | INTRAVENOUS | Status: AC
  Administered 2015-05-26: 15:00:00 via INTRAVENOUS

## 2015-05-26 MED ORDER — SODIUM CHLORIDE 0.9 % IV SOLN
Freq: Once | INTRAVENOUS | Status: AC
Start: 2015-05-26 — End: 2015-05-26
  Administered 2015-05-26: 16:00:00 via INTRAVENOUS
  Filled 2015-05-26: qty 8

## 2015-05-26 MED ORDER — ATROPINE SULFATE 1 MG/ML IJ SOLN
INTRAMUSCULAR | Status: AC
  Filled 2015-05-26: qty 1

## 2015-05-26 MED ORDER — DEXTROSE 5 % IV SOLN
125.5000 mg/m2 | Freq: Once | INTRAVENOUS | Status: AC
  Administered 2015-05-26: 300 mg via INTRAVENOUS
  Filled 2015-05-26: qty 15

## 2015-05-26 MED ORDER — LOPERAMIDE HCL 2 MG PO CAPS: 2.0000 mg | ORAL_CAPSULE | ORAL | Status: DC | PRN

## 2015-05-26 MED ORDER — LORAZEPAM 0.5 MG PO TABS: ORAL_TABLET | ORAL | Status: DC

## 2015-05-26 NOTE — Patient Instructions (Signed)
[facility] Discharge Instructions for Patients Receiving Chemotherapy  Today you received the following chemotherapy agents Avastin and Camptosar.  To help prevent nausea and vomiting after your treatment, we encourage you to take your nausea medication as prescribed.   If you develop nausea and vomiting that is not controlled by your nausea medication, call the clinic.   BELOW ARE SYMPTOMS THAT SHOULD BE REPORTED IMMEDIATELY:  *FEVER GREATER THAN 100.5 F  *CHILLS WITH OR WITHOUT FEVER  NAUSEA AND VOMITING THAT IS NOT CONTROLLED WITH YOUR NAUSEA MEDICATION  *UNUSUAL SHORTNESS OF BREATH  *UNUSUAL BRUISING OR BLEEDING  TENDERNESS IN MOUTH AND THROAT WITH OR WITHOUT PRESENCE OF ULCERS  *URINARY PROBLEMS  *BOWEL PROBLEMS  UNUSUAL RASH Items with * indicate a potential emergency and should be followed up as soon as possible.  Feel free to call the clinic you have any questions or concerns. The clinic phone number is [phone].  Please show the [facility] at check-in to the Emergency Department and triage nurse.

## 2015-05-26 NOTE — Progress Notes (Signed)
[facility] FOLLOW UP NOTE  Patient Care Team: [name], MD as PCP - General (Family Medicine) Kary Kos, MD as Consulting Physician (Neurosurgery) Eppie Gibson, MD as Attending Physician (Radiation Oncology) Truitt Merle, MD as Consulting Physician (Hematology)     Glioblastoma of occipital lobe   07/02/2014 Imaging Brain MRI: Two adjacent enhancing mass lesions in the right parietal lobe with central necrosis and extensive surrounding edema and mass effect.  7 mm midline shift to the left.  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   CT CAP(-)   07/07/2014 Surgery Subtotal right parietal tumors resection    08/03/2014 Concurrent Chemotherapy Concurrent radiation with Temodar 75 mg/m daily, completed on 09/15/2014.    09/29/2014 - 12/28/2014 Chemotherapy Adjuvant Temodar, 150-200mg /m2 for 5 days, every 28 day cycle, s/p 3 cycles, stopped due to disease progression.   10/14/2014 Imaging Two discrete foci of enhancement in the RIGHT occipital lobe surround the larger resection cavity, and are suspected to represent foci of residual tumor/GBM displaying interval growt   12/13/2014 Imaging Increased size/ enhancement of 2 lesions adjacent to the right occipital resection cavity, concerning for growth of residual tumor.   01/06/2015 -  Chemotherapy Bevacizumab 10mg /kg every 2 weeks   04/18/2015 Progression Brain MRI showed progressed masslike signal abnormality in the right occipital lobe, most compatible with interval to progression of disease.   04/28/2015 -  Chemotherapy Irinotecan 1 25 mg/m, every 2 weeks, was added on to Avastin due to disease progression.    GBM (glioblastoma multiforme)   07/22/2014 Initial Diagnosis GBM (glioblastoma multiforme)    CURRENT THERAPY: 1. Bevacizumab 10 mg/kg every 2 weeks, started on 01/06/2059 2. Irinotecan 125 mg/m, every 2 weeks, was added on 04/28/2015 3. dexa 4mg  daily until 6/10, changed to 4mg  and 2mg  daily alternatively,  then 2mg  daily from 7/1, then 2mg  daily 2 days on, 1 day off from 7/25, hydrocortisone was added onto her steroids tapering by [name] in early August.  CHIEF COMPLAINTS: follow up GBM   HISTORY OF INITIAL PRESENTATION;   He initially presented was headaches and sinus congestion in mid October 2015. He was seen by his primary care physician and was treated for sinus infection. He subsequently developed left sided visual deficit, left arm and leg weakness and unsteady gait. He was sent to emergency room by his primary care physician on 07/02/2014. CT  And MRI of brain reviewed to add adjacent enhancing mass lesions in the right parietal lobe with central necrosis and extensive surrounding edema and mass effect. He was admitted and underwent right craniotomy  For resection of the 2 brain masses by Dr. Lonell Grandchild. Postsurgical brain MRI revealed a 1.3 cm area of enhancement at the anterior margin of surgical resection cavity, likely surgical change. His case was reviewed in the tumor board and it was felt he had subtotal resection.  INTERVAL HISTROY: Nehal returns for follow-up. He developed moderate diarrhea for 4-5 days after his prior cycle chem, with watery stool, no abdominal pain, fever or nausea. He did not take anything for diarrhea. It resolved on its own. He was able to keep oral fluids intake, otherwise he tolerated chemotherapy well. His dexa has been tapered down to 0.5 mg a day, he still on hydrocortisone 30 mg daily, he has mild intermittent headaches, no other new neurologic symptoms. His muscle weakness has improved some lately, he is able to tolerate more activity. He is eating well.  MEDICAL HISTORY:  Past Medical History  Diagnosis Date  . Thyroid disease   . Hypertension   . Allergy   . Arthritis   .    Marland Kitchen Kidney stone     SURGICAL HISTORY: Past Surgical History  Procedure Laterality Date  . Knee arthroscopy w/ meniscal repair    . Treatment fistula anal    . 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 Marital Status: Married    Spouse Name: N/A    Number of Children: 5 children, age of 63-21  . Years of Education: N/A   Occupational History  . 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    Social History Main Topics  . Smoking status: Current Every Day Smoker -- 1.00 packs/day for 45 years    Types: Cigarettes  . Smokeless tobacco: Never Used  . Alcohol Use: Yes     Comment: 2-3 beers per/day   . Drug Use: No  . Sexual Activity: Not on file     FAMILY HISTORY: Family History  Problem Relation Age of Onset  . Hypertension Mother   . Diabetes Father   . Glaucoma Father   . Diabetes Brother   . Diabetes Brother   . Colon cancer Neg Hx   Paternal ancle had prostate cancer. No other family history of malignancy.  ALLERGIES:  has No Known Allergies.   MEDICATIONS:  Current Outpatient Prescriptions  Medication Sig Dispense Refill  . bevacizumab in sodium chloride 0.9 % 100 mL Inject into the vein every 14 (fourteen) days.    . chlorhexidine (PERIDEX) 0.12 % solution Rinse with 15 mls three daily for 30 seconds. Use after breakfast, lunch and at bedtime. Spit out excess. Do not swallow. 1440 mL prn  . clonazePAM (KLONOPIN) 1 MG tablet Take 1-2 tablets (1-2 mg total) by mouth at bedtime. 30 tablet 3  . dexamethasone (DECADRON) 0.5 MG tablet Take 1.5 mg daily x2weeks, then 1 mg daily x2weeks, then 0.5 mg daily x2 weeks, then taper off. 8/6-8/19 dexamethasone 1.5mg  in the am 8/20-9/2 dexamethasone 1 mg in the am 9/3-9/16 dexamethasone 0.5mg  in am 9/17-9/23 dexamethasone 0.5 mg every other day 9/24 stop dexamethasone    . docusate sodium (COLACE) 100 MG capsule Take 100 mg by mouth 2 (two) times daily.    Marland Kitchen HYDROcodone-acetaminophen (NORCO/VICODIN)  5-325 MG per tablet Take 1 tablet by mouth every 6 (six) hours as needed for moderate pain. 30 tablet 0  . hydrocortisone (CORTEF) 10 MG tablet Take 20 mg (2 tabs) in AM, 10 mg (1 tab) in PM    . levothyroxine (SYNTHROID, LEVOTHROID) 50 MCG tablet Take 1 tablet (50 mcg total) by mouth daily before breakfast. 90 tablet 3  . lisinopril (PRINIVIL,ZESTRIL) 40 MG tablet Take 1 tablet (40 mg total) by mouth daily. 90 tablet 3  . LORazepam (ATIVAN) 0.5 MG tablet take 1 to 2 tablets by mouth every 8 hours if needed for anxiety nausea or sleep 90 tablet 0  . Multiple Vitamins-Minerals (MULTIVITAMIN WITH MINERALS) tablet Take 1 tablet by mouth daily.    Marland Kitchen NEXIUM 24HR 20 MG capsule   0  . ondansetron (ZOFRAN) 8 MG tablet Take 1 tablet (8 mg total) by mouth every 8 (eight) hours as needed for nausea or vomiting. 30 tablet 4  . Probiotic Product (PROBIOTIC  DAILY PO) Take by mouth daily.     . prochlorperazine (COMPAZINE) 10 MG tablet Take 1 tablet (10 mg total) by mouth every 6 (six) hours as needed for nausea or vomiting. 60 tablet 1  . senna (SENOKOT) 8.6 MG tablet Take 1 tablet by mouth daily.    . verapamil (COVERA HS) 180 MG (CO) 24 hr tablet Take 1 tablet (180 mg total) by mouth at bedtime. 90 tablet 3  . loperamide (IMODIUM) 2 MG capsule Take 1 capsule (2 mg total) by mouth as needed for diarrhea or loose stools. 60 capsule 0   No current facility-administered medications for this visit.   Facility-Administered Medications Ordered in Other Visits  Medication Dose Route Frequency Provider Last Rate Last Dose  . atropine injection 1 mg  1 mg Intravenous Once PRN Truitt Merle, MD      . irinotecan (CAMPTOSAR) 300 mg in dextrose 5 % 500 mL chemo infusion  125.5 mg/m2 (Treatment Plan Actual) Intravenous Once Truitt Merle, MD        REVIEW OF SYSTEMS:   Constitutional: Denies fevers, chills or abnormal night sweats Eyes: Denies blurriness of vision, double vision or watery eyes Ears, nose, mouth, throat, and  face: Denies mucositis or sore throat Respiratory: Denies cough, dyspnea or wheezes Cardiovascular: Denies palpitation, chest discomfort or lower extremity swelling Gastrointestinal:  Denies nausea, heartburn or change in bowel habits Skin: Denies abnormal skin rashes Lymphatics: Denies new lymphadenopathy or easy bruising Neurological:positive for mild headaches, and left side vision deficits. His left-sided weakness has nearly resolved. Denies numbness, tingling or new weaknesses Behavioral/Psych: Mood is stable, no new changes  All other systems were reviewed with the patient and are negative.  PHYSICAL EXAMINATION: ECOG PERFORMANCE STATUS: 1 - Symptomatic but completely ambulatory  KPS: 80%  Filed Vitals:   05/26/15 1359  BP: 138/74  Pulse: 86  Temp: 97.7 F (36.5 C)  Resp: 19   Filed Weights   05/26/15 1359  Weight: 245 lb 14.4 oz (111.54 kg)    GENERAL:alert, no distress and comfortable SKIN: skin color, texture, turgor are normal, no rashes or significant lesions HEAD: right-sided posterior craniotomy surgical wound is healing well, no surrounding edema, skin redness or discharge EYES: normal, conjunctiva are pink and non-injected, sclera clear OROPHARYNX:no exudate, no erythema and lips, buccal mucosa, and tongue normal  NECK: supple, thyroid normal size, non-tender, without nodularity LYMPH:  no palpable lymphadenopathy in the cervical, axillary or inguinal LUNGS: clear to auscultation and percussion with normal breathing effort HEART: regular rate & rhythm and no murmurs and no lower extremity edema ABDOMEN:abdomen soft, non-tender and normal bowel sounds Musculoskeletal:no cyanosis of digits and no clubbing  PSYCH: alert & oriented x 3 with fluent speech NEURO: cranial nerve 2-12 are intact. Left side vision deficits is present in the left upper and mid quadrant. Motor and sensitivity are normal and symmetric on all extremities. Speech is fluent, coordination is  grossly intact. Ambulates independently and Lutricia Feil sign negative. EXT: Bilateral pitting edema in ankles, stable    LABORATORY DATA:  CBC Latest Ref Rng 05/25/2015 05/12/2015 04/28/2015  WBC 4.0 - 10.3 10e3/uL 5.9 5.0 7.9  Hemoglobin 13.0 - 17.1 g/dL 13.2 13.2 13.9  Hematocrit 38.4 - 49.9 % 39.8 40.0 42.5  Platelets 140 - 400 10e3/uL 234 206 156    CMP Latest Ref Rng 05/25/2015 05/12/2015 04/28/2015  Glucose 70 - 140 mg/dl 95 146(H) 112  BUN 7.0 - 26.0 mg/dL 12.5 10.0 15.8  Creatinine 0.7 - 1.3 mg/dL  1.0 0.9 0.9  Sodium 136 - 145 mEq/L 140 138 139  Potassium 3.5 - 5.1 mEq/L 3.9 3.6 4.3  Chloride 96 - 112 mEq/L - - -  CO2 22 - 29 mEq/L 25 23 24   Calcium 8.4 - 10.4 mg/dL 9.5 9.6 9.4  Total Protein 6.4 - 8.3 g/dL 6.0(L) 6.2(L) 6.1(L)  Total Bilirubin 0.20 - 1.20 mg/dL 0.37 0.54 0.55  Alkaline Phos 40 - 150 U/L 72 70 68  AST 5 - 34 U/L 27 25 25   ALT 0 - 55 U/L 35 30 33     Surgical path 07/07/2014  Diagnosis 1. Brain, biopsy - GLIOBLASTOMA MULTIFORME, WHO GRADE IV/IV. - SEE ONCOLOGY TABLE BELOW. 2. Brain, for tumor resection, higher right parietal - GLIOBLASTOMA MULTIFORME, WHO GRADE IV/IV. - SEE ONCOLOGY TABLE BELOW. 3. Brain, for tumor resection, lower right occipital - GLIOBLASTOMA MULTIFORME, WHO GRADE IV/IV   Microscopic Comment 1. -3. ONCOLOGY TABLE - BRAIN AND SPINAL CORD 1. Procedure: Resection x2 2. Tumor site, including laterality: Right parietal and right occipital 3. Maximum tumor size (cm): At least 1.8 cm 4. Histologic type: Glioblastoma multiforme 5. Grade: WHO Grade IV/IV 6. Margins (if applicable): Can not be assessed 7. Ancillary studies: Can be performed upon clinician request.  RADIOGRAPHIC STUDIES: I have personally reviewed the radiological images as listed and agreed with the findings in the report.  Mr [name] Wo Contrast 04/20/2015 IMPRESSION: 1. Progressed mass like signal abnormality in the right occipital lobe most compatible with interval true  progression of disease. Patchy associated enhancement. Effacement of the right occipital horn and mild mass effect on the atrium. 2. No ventriculomegaly. No other acute intracranial abnormality.  ASSESSMENT & PLAN:  Mr. Jake [name] is a 59 year old gentleman with newly diagnosed right parietal GBM, status post subtotal resection.   1. GBM, s/p subtotal resection, and concurrent chemoRT completed on 09/15/2014, progressed on adjuvant Temodar.  -I reviewed his restaging brain MRI from 4.12.16,  previous 2 small foci of enhancement around the surgical site has significantly increased on the repeated ischemic. This was reviewed in our brain tumor board this morning, and we agreed this is tumor for progression. Unfortunately, due to the position of this 2 lesions, subtotal resection are unlikely and surgery is not offered -We discussed systemic therapy options. I recommend second line Avastin. He progressed through Temodar, I'll stop Temodar. -His recent restaging brain MRI from 04/20/15 unfortunately showed disease progression. This was reviewed with patient and his wife. I also discussed with [name] neuro oncologist Dr. Imagene Riches and she recommended adding irinotecan to avastin, every 2 weeks. She may have a clinical trial option for her in Nov 2016  - she tolerated the first two dose irinotecan well,  Lab reviewed, adequate for treatment, we'll proceed with third dose,  and continue Avastin today. -She will continue tapering steroids per [name]'s recommendation -cont Nexium daily when he is on steroids, vitamin the vitamin D and calcium, and regular exercise. -He is scheduled to have a repeat brain MRI on September 30, and see radiation oncologist Dr. Isidore Moos on the following week  2. Diarrhea -Secondary to chemotherapy -I reviewed the strategy of diarrhea management, he knows to use Imodium as needed after chemotherapy -I encouraged him to keep adequate fluids intake  3. Anorexia, fatigue,  nausea -Improved -Follow up with dietitian. Use Zofran as needed.  3. HTN and hypothyroidism  -continue follow up with PCP -TSH normal on 03/03/2015  4. Dental issue - he will  follow-up with Dr. Enrique Sack  next week, has had a 2 small dental procedure.   Plan: -third dose Irinotecan and Avast today and continue every 2 weeks  -I will see him back in 2 weeks  -repeat brain MRI on 9/30   All questions were answered. The patient knows to call the clinic with any problems, questions or concerns.  I spent 20 minutes counseling the patient face to face. The total time spent in the appointmen was 25 minutes and more than 50% was on counseling.     Truitt Merle, MD 05/26/2015 4:41 PM

## 2015-05-29 ENCOUNTER — Telehealth: Payer: Self-pay | Admitting: Hematology

## 2015-05-29 NOTE — Telephone Encounter (Signed)
Called and left a message with  10/7 appointments

## 2015-06-02 ENCOUNTER — Ambulatory Visit (HOSPITAL_COMMUNITY)
Admission: RE | Admit: 2015-06-02 | Discharge: 2015-06-02 | Disposition: A | Discharge status: Home / Self Care | Payer: BLUE CROSS/BLUE SHIELD | Source: Ambulatory Visit | Attending: Hematology | Admitting: Hematology

## 2015-06-02 DIAGNOSIS — C714 Malignant neoplasm of occipital lobe: Secondary | ICD-10-CM | POA: Diagnosis not present

## 2015-06-02 MED ORDER — GADOBENATE DIMEGLUMINE 529 MG/ML IV SOLN
20.0000 mL | Freq: Once | INTRAVENOUS | Status: AC | PRN
  Administered 2015-06-02: 20 mL via INTRAVENOUS

## 2015-06-05 ENCOUNTER — Ambulatory Visit
Admission: RE | Admit: 2015-06-05 | Discharge: 2015-06-05 | Disposition: A | Discharge status: Home / Self Care | Payer: BLUE CROSS/BLUE SHIELD | Source: Ambulatory Visit | Attending: Radiation Oncology | Admitting: Radiation Oncology

## 2015-06-05 NOTE — Progress Notes (Signed)
Patient was a No Show due to scheduling confusion.  I talked w/ patient over phone and reviewed MRI results (progression). Will discuss further with Dr. Imagene Riches at [facility] to review trial options vs proceeding with Optune therapy.  -----------------------------------  Eppie Gibson, MD

## 2015-06-07 ENCOUNTER — Encounter: Payer: Self-pay | Admitting: Radiation Oncology

## 2015-06-07 ENCOUNTER — Telehealth: Payer: Self-pay | Admitting: Hematology

## 2015-06-07 NOTE — Progress Notes (Signed)
I talked with Dr. Imagene Riches at [facility] and discussed Mr Sasaki' progression. She will arrange a followup in her office soon to discuss trials that are possible options. She will get back in touch with Mont Dutton, RTT  as to whether they will proceed with a trial at [facility] or release him for Optune instead.     I left a voicemail with patient to review my discussion w/ Dr Imagene Riches and future plans.  -----------------------------------  Eppie Gibson, MD

## 2015-06-07 NOTE — Telephone Encounter (Signed)
s.w. pt and advised on tomorrow lab.Marland KitchenMarland KitchenMarland KitchenMarland Kitchenpt ok and aware

## 2015-06-08 ENCOUNTER — Ambulatory Visit (HOSPITAL_COMMUNITY): Payer: Self-pay | Admitting: Dentistry

## 2015-06-08 ENCOUNTER — Other Ambulatory Visit (HOSPITAL_BASED_OUTPATIENT_CLINIC_OR_DEPARTMENT_OTHER): Payer: BLUE CROSS/BLUE SHIELD

## 2015-06-08 ENCOUNTER — Telehealth: Payer: Self-pay | Admitting: Hematology

## 2015-06-08 ENCOUNTER — Encounter (HOSPITAL_COMMUNITY): Payer: Self-pay | Admitting: Dentistry

## 2015-06-08 ENCOUNTER — Other Ambulatory Visit: Payer: Self-pay | Admitting: Hematology

## 2015-06-08 VITALS — BP 122/74 | HR 75 | Temp 98.0°F

## 2015-06-08 DIAGNOSIS — R29898 Other symptoms and signs involving the musculoskeletal system: Secondary | ICD-10-CM

## 2015-06-08 DIAGNOSIS — C714 Malignant neoplasm of occipital lobe: Secondary | ICD-10-CM | POA: Diagnosis not present

## 2015-06-08 DIAGNOSIS — C719 Malignant neoplasm of brain, unspecified: Secondary | ICD-10-CM

## 2015-06-08 DIAGNOSIS — K08109 Complete loss of teeth, unspecified cause, unspecified class: Secondary | ICD-10-CM

## 2015-06-08 DIAGNOSIS — Z923 Personal history of irradiation: Secondary | ICD-10-CM

## 2015-06-08 DIAGNOSIS — K0820 Unspecified atrophy of edentulous alveolar ridge: Secondary | ICD-10-CM

## 2015-06-08 DIAGNOSIS — C713 Malignant neoplasm of parietal lobe: Secondary | ICD-10-CM | POA: Diagnosis not present

## 2015-06-08 LAB — CBC WITH DIFFERENTIAL/PLATELET
BASO%: 0.8 % (ref 0.0–2.0)
Basophils Absolute: 0.1 10*3/uL (ref 0.0–0.1)
EOS%: 1.2 % (ref 0.0–7.0)
Eosinophils Absolute: 0.1 10*3/uL (ref 0.0–0.5)
HEMATOCRIT: 41.8 % (ref 38.4–49.9)
HGB: 13.8 g/dL (ref 13.0–17.1)
LYMPH#: 1.5 10*3/uL (ref 0.9–3.3)
LYMPH%: 22.6 % (ref 14.0–49.0)
MCH: 29.7 pg (ref 27.2–33.4)
MCHC: 33 g/dL (ref 32.0–36.0)
MCV: 90.2 fL (ref 79.3–98.0)
MONO#: 0.7 10*3/uL (ref 0.1–0.9)
MONO%: 10.9 % (ref 0.0–14.0)
NEUT%: 64.5 % (ref 39.0–75.0)
NEUTROS ABS: 4.1 10*3/uL (ref 1.5–6.5)
Platelets: 219 10*3/uL (ref 140–400)
RBC: 4.63 10*6/uL (ref 4.20–5.82)
RDW: 20.2 % — ABNORMAL HIGH (ref 11.0–14.6)
WBC: 6.4 10*3/uL (ref 4.0–10.3)

## 2015-06-08 LAB — COMPREHENSIVE METABOLIC PANEL (CC13)
ALT: 37 U/L (ref 0–55)
AST: 28 U/L (ref 5–34)
Albumin: 3.5 g/dL (ref 3.5–5.0)
Alkaline Phosphatase: 64 U/L (ref 40–150)
Anion Gap: 7 mEq/L (ref 3–11)
BILIRUBIN TOTAL: 0.54 mg/dL (ref 0.20–1.20)
BUN: 10.6 mg/dL (ref 7.0–26.0)
CALCIUM: 9.7 mg/dL (ref 8.4–10.4)
CHLORIDE: 105 meq/L (ref 98–109)
CO2: 26 meq/L (ref 22–29)
CREATININE: 1 mg/dL (ref 0.7–1.3)
EGFR: 81 mL/min/{1.73_m2} — ABNORMAL LOW (ref >90)
Glucose: 93 mg/dl (ref 70–140)
Potassium: 4 mEq/L (ref 3.5–5.1)
Sodium: 137 mEq/L (ref 136–145)
TOTAL PROTEIN: 6.4 g/dL (ref 6.4–8.3)

## 2015-06-08 NOTE — Patient Instructions (Signed)
Plan/Recommendations: 1. Continue chlorhexidine rinses 3 times daily as prescribed. 2. Brush tongue 2-3 times a day. 3. Return to clinic in one month for reevaluation of healing. 4. Call if problems arise before then. 5. Discussion of findings with medical team and coordination of future medical and dental care as needed.    Lenn Cal, DDS

## 2015-06-08 NOTE — Telephone Encounter (Signed)
lvm for pt regarding to appt changes

## 2015-06-08 NOTE — Progress Notes (Signed)
Limited oral examination   Date:    06/08/2015 Patient Name:   Jake [name] Date of Birth:   12/16/1955 Medical Record Number: [id number]  VITALS: BP 122/74 mmHg  Pulse 75  Temp(Src) 98 F (36.7 C) (Oral)  HPI: [place] is a 59 year old male with history of glioblastoma multiforme. The patient underwent craniotomy and surgical resection of the brain tumor on 07/07/2014 with Dr. Saintclair Halsted. The patient then underwent brain radiation therapy from 08/03/2014 through 09/15/2014 with Dr. Isidore Moos. Patient has been undergoing chemotherapy with Dr.Feng and is currently having Avastin and Ironotecan chemotherapy administered on an every two-week basis. Patient developed mandibular left lingual exposed bone approximately 5-6 weeks ago. The patient was examined on 05/10/2015 and found to have a 12 x 3 mm area of exposed bone involving the mandibular left lingual alveolar ridge area #17 thru #18. A bony sequestrum was noted at that time. The bony sequestrum was able to be removed with a soft tissue pliers without complications and minimal heme. There was some persistent exposed bone still present. Patient was then placed on chlorhexidine rinses to use on a 3 times a day basis and a follow-up appointment was scheduled for follow up.  The patient had additional ostectomy performed on 05/25/15 due to persistent exposed bone.  The patient now presents for reevaluation of healing and additional partial ostectomy as indicated. A CBC was obtained today and results are listed below. There is no evidence of thrombocytopenia.    Lab Results  Component Value Date   WBC 6.4 06/08/2015   HGB 13.8 06/08/2015   HCT 41.8 06/08/2015   MCV 90.2 06/08/2015   PLT 219 06/08/2015    SUBJECTIVE: Patient indicates that the lower left discomfort is much better. The patient denies being able to feel the exposed bone with his tongue by report. Patient is using the chlorhexidine rinses 3 times daily. Patient is brushing his  tongue daily. Patient indicates that his next chemotherapy session for tomorrow has been cancelled. Patient is going Monday to [facility] to discuss four other potential  chemotherapy regimens.  OBJECTIVE: There is a persistent 2 x 6 mm area of exposed bone. There is no loose bony sequestrum noted.  There is no protruding bone coming from the exposed mandible. The soft tissues around the exposed mandible are not erythematous.   ASSESSMENT: 1. History of glioblastoma multiforme with surgical resection on 07/07/2014. 2. Status post brain radiation therapy from 08/03/2014 through 09/15/2014 3. Active chemotherapy with Avastin and Ironotecan 4. The patient is edentulous 5. 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 Persistent mandibular left lingual exposed bone in the area #17 and 18 now measuring 2 x 6 mm 8. Status post removal of bony sequestrum 0n 05/10/15 and 05/25/15. 9. History of upper and lower complete dentures but the patient only wears the upper denture.  10. History of Avastin chemotherapy with the risk for bleeding, delayed healing, and drug-induced osteonecrosis of the jaw.  I discussed the risks, benefits, and complications of various treatment options with the patient in relationship to his medical and dental conditions, active chemotherapy, and risk for bleeding, delayed healing, and osteonecrosis of the jaw.  We discussed various treatment options to include no treatment versus partial ostectomy and bony reshaping of the mandibular left lingual alveolar bone. We also discussed continued utilization of chlorhexidine 0.12% oral rinses 3 times a day to assist in disinfection of the oral cavity at this time. The patient currently wishes  to defer any additional partial ostectomy at this time. The patient will continue to use the chlorhexidine rinses.   Plan/Recommendations: 1. Continue chlorhexidine rinses 3 times daily as  prescribed. 2. Brush tongue 2-3 times a day. 3. Return to clinic in one month for reevaluation of healing. 4. Call problems arise before then. 5. Discussion of findings with medical team and coordination of future medical and dental care as needed.    Lenn Cal, DDS

## 2015-06-09 ENCOUNTER — Ambulatory Visit: Payer: Self-pay

## 2015-06-09 ENCOUNTER — Other Ambulatory Visit: Payer: Self-pay

## 2015-06-09 ENCOUNTER — Encounter: Payer: Self-pay | Admitting: Hematology

## 2015-06-12 NOTE — Progress Notes (Signed)
This encounter was created in error - please disregard.

## 2015-06-16 ENCOUNTER — Ambulatory Visit (HOSPITAL_BASED_OUTPATIENT_CLINIC_OR_DEPARTMENT_OTHER): Payer: BLUE CROSS/BLUE SHIELD | Admitting: Hematology

## 2015-06-16 ENCOUNTER — Encounter: Payer: Self-pay | Admitting: Hematology

## 2015-06-16 ENCOUNTER — Telehealth: Payer: Self-pay | Admitting: Hematology

## 2015-06-16 VITALS — BP 129/80 | HR 80 | Temp 97.1°F | Resp 20 | Ht 71.0 in | Wt 238.6 lb

## 2015-06-16 DIAGNOSIS — R5383 Other fatigue: Secondary | ICD-10-CM | POA: Diagnosis not present

## 2015-06-16 DIAGNOSIS — C714 Malignant neoplasm of occipital lobe: Secondary | ICD-10-CM | POA: Diagnosis not present

## 2015-06-16 DIAGNOSIS — R630 Anorexia: Secondary | ICD-10-CM

## 2015-06-16 DIAGNOSIS — C713 Malignant neoplasm of parietal lobe: Secondary | ICD-10-CM | POA: Diagnosis not present

## 2015-06-16 DIAGNOSIS — I10 Essential (primary) hypertension: Secondary | ICD-10-CM

## 2015-06-16 DIAGNOSIS — E039 Hypothyroidism, unspecified: Secondary | ICD-10-CM

## 2015-06-16 DIAGNOSIS — R110 Nausea: Secondary | ICD-10-CM

## 2015-06-16 MED ORDER — HYDROCODONE-ACETAMINOPHEN 5-325 MG PO TABS: 1.0000 | ORAL_TABLET | Freq: Four times a day (QID) | ORAL | Status: DC | PRN

## 2015-06-16 NOTE — Progress Notes (Signed)
[place] FOLLOW UP NOTE  Patient Care Team: [name], MD as PCP - General (Family Medicine) Kary Kos, MD as Consulting Physician (Neurosurgery) Eppie Gibson, MD as Attending Physician (Radiation Oncology) Truitt Merle, MD as Consulting Physician (Hematology)     Glioblastoma of occipital lobe [facility])   07/02/2014 Imaging Brain MRI: Two adjacent enhancing mass lesions in the right parietal lobe with central necrosis and extensive surrounding edema and mass effect.  7 mm midline shift to the left.  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   CT CAP(-)   07/07/2014 Surgery Subtotal right parietal tumors resection    08/03/2014 Concurrent Chemotherapy Concurrent radiation with Temodar 75 mg/m daily, completed on 09/15/2014.    09/29/2014 - 12/28/2014 Chemotherapy Adjuvant Temodar, 150-200mg /m2 for 5 days, every 28 day cycle, s/p 3 cycles, stopped due to disease progression.   10/14/2014 Imaging Two discrete foci of enhancement in the RIGHT occipital lobe surround the larger resection cavity, and are suspected to represent foci of residual tumor/GBM displaying interval growt   12/13/2014 Imaging Increased size/ enhancement of 2 lesions adjacent to the right occipital resection cavity, concerning for growth of residual tumor.   01/06/2015 -  Chemotherapy Bevacizumab 10mg /kg every 2 weeks   04/18/2015 Progression Brain MRI showed progressed masslike signal abnormality in the right occipital lobe, most compatible with interval to progression of disease.   04/28/2015 - 05/26/2015 Chemotherapy Irinotecan 1 25 mg/m, every 2 weeks, was added on to Avastin due to disease progression. Stopped due to disease progression.     GBM (glioblastoma multiforme) ([place])   07/22/2014 Initial Diagnosis GBM (glioblastoma multiforme)    CURRENT THERAPY: 1. Bevacizumab 10 mg/kg every 2 weeks, started on 01/06/2059, stopped on 05/26/2015 due to disease progression  2. Irinotecan 125 mg/m, every  2 weeks, was added on 04/28/2015, stopped after 05/26/2015 due to disease progression  3. dexa 4mg  daily until 6/10, changed to 4mg  and 2mg  daily alternatively, then 2mg  daily from 7/1, then 2mg  daily 2 days on, 1 day off from 7/25, hydrocortisone was added onto her steroids tapering by [name] in early August. Currently on dexa 0.5mg  dialy, 2 days on, one day off, and hydrocortisone 30 mg daily  CHIEF COMPLAINTS: follow up GBM   HISTORY OF INITIAL PRESENTATION;   He initially presented was headaches and sinus congestion in mid October 2015. He was seen by his primary care physician and was treated for sinus infection. He subsequently developed left sided visual deficit, left arm and leg weakness and unsteady gait. He was sent to emergency room by his primary care physician on 07/02/2014. CT  And MRI of brain reviewed to add adjacent enhancing mass lesions in the right parietal lobe with central necrosis and extensive surrounding edema and mass effect. He was admitted and underwent right craniotomy  For resection of the 2 brain masses by Dr. Lonell Grandchild. Postsurgical brain MRI revealed a 1.3 cm area of enhancement at the anterior margin of surgical resection cavity, likely surgical change. His case was reviewed in the tumor board and it was felt he had subtotal resection.  INTERVAL HISTROY: Jarious returns for follow-up. His restaging brain MRI showed further disease progression about 2 weeks ago, and chemotherapy and Avastin was stopped. He went to [facility] and saw Dr. Imagene Riches again. She was just approved for new vaccine trial yesterday, and will likely start in mid or late November. He is very excited about the opportunity to participate in this trial. He is clinically  stable, occasional headaches, no other new symptoms. His dexamethasone has been slowly tapered off, managed by [name].  MEDICAL HISTORY:  Past Medical History  Diagnosis Date  . Thyroid disease   . Hypertension   . Allergy   . Arthritis   .     Marland Kitchen Kidney stone     SURGICAL HISTORY: Past Surgical History  Procedure Laterality Date  . Knee arthroscopy w/ meniscal repair    . Treatment fistula anal    . 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 Marital Status: Married    Spouse Name: N/A    Number of Children: 5 children, age of 66-21  . Years of Education: N/A   Occupational History  . 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    Social History Main Topics  . Smoking status: Current Every Day Smoker -- 1.00 packs/day for 45 years    Types: Cigarettes  . Smokeless tobacco: Never Used  . Alcohol Use: Yes     Comment: 2-3 beers per/day   . Drug Use: No  . Sexual Activity: Not on file     FAMILY HISTORY: Family History  Problem Relation Age of Onset  . Hypertension Mother   . Diabetes Father   . Glaucoma Father   . Diabetes Brother   . Diabetes Brother   . Colon cancer Neg Hx   Paternal ancle had prostate cancer. No other family history of malignancy.  ALLERGIES:  has No Known Allergies.   MEDICATIONS:  Current Outpatient Prescriptions  Medication Sig Dispense Refill  . bevacizumab in sodium chloride 0.9 % 100 mL Inject into the vein every 14 (fourteen) days.    . chlorhexidine (PERIDEX) 0.12 % solution Rinse with 15 mls three daily for 30 seconds. Use after breakfast, lunch and at bedtime. Spit out excess. Do not swallow. 1440 mL prn  . clonazePAM (KLONOPIN) 1 MG tablet Take 1-2 tablets (1-2 mg total) by mouth at bedtime. 30 tablet 3  . dexamethasone (DECADRON) 0.5 MG tablet Take 1.5 mg daily x2weeks, then 1 mg daily x2weeks, then 0.5 mg daily x2 weeks, then taper off. 8/6-8/19 dexamethasone 1.5mg  in the am 8/20-9/2 dexamethasone 1 mg in the am 9/3-9/16 dexamethasone 0.5mg  in am 9/17-9/23 dexamethasone 0.5  mg every other day 9/24 stop dexamethasone 06/16/15 back on 0.5 mg x 2 days & off 1 day    . docusate sodium (COLACE) 100 MG capsule Take 100 mg by mouth 2 (two) times daily.    Marland Kitchen HYDROcodone-acetaminophen (NORCO/VICODIN) 5-325 MG tablet Take 1 tablet by mouth every 6 (six) hours as needed for moderate pain. 30 tablet 0  . hydrocortisone (CORTEF) 10 MG tablet Take 20 mg (2 tabs) in AM, 10 mg (1 tab) in PM    . levothyroxine (SYNTHROID, LEVOTHROID) 50 MCG tablet Take 1 tablet (50 mcg total) by mouth daily before breakfast. 90 tablet 3  . lisinopril (PRINIVIL,ZESTRIL) 40 MG tablet Take 1 tablet (40 mg total) by mouth daily. 90 tablet 3  . loperamide (IMODIUM) 2 MG capsule Take 1 capsule (2 mg total) by mouth as needed for diarrhea or loose stools. 60 capsule 0  . LORazepam (ATIVAN) 0.5 MG tablet take 1 to 2 tablets by mouth every 8 hours if needed for anxiety nausea or sleep 90  tablet 0  . Multiple Vitamins-Minerals (MULTIVITAMIN WITH MINERALS) tablet Take 1 tablet by mouth daily.    Marland Kitchen NEXIUM 24HR 20 MG capsule   0  . ondansetron (ZOFRAN) 8 MG tablet Take 1 tablet (8 mg total) by mouth every 8 (eight) hours as needed for nausea or vomiting. 30 tablet 4  . Probiotic Product (PROBIOTIC DAILY PO) Take by mouth daily.     . prochlorperazine (COMPAZINE) 10 MG tablet Take 1 tablet (10 mg total) by mouth every 6 (six) hours as needed for nausea or vomiting. 60 tablet 1  . senna (SENOKOT) 8.6 MG tablet Take 1 tablet by mouth daily.    . verapamil (COVERA HS) 180 MG (CO) 24 hr tablet Take 1 tablet (180 mg total) by mouth at bedtime. 90 tablet 3   No current facility-administered medications for this visit.    REVIEW OF SYSTEMS:   Constitutional: Denies fevers, chills or abnormal night sweats Eyes: Denies blurriness of vision, double vision or watery eyes Ears, nose, mouth, throat, and face: Denies mucositis or sore throat Respiratory: Denies cough, dyspnea or wheezes Cardiovascular: Denies palpitation,  chest discomfort or lower extremity swelling Gastrointestinal:  Denies nausea, heartburn or change in bowel habits Skin: Denies abnormal skin rashes Lymphatics: Denies new lymphadenopathy or easy bruising Neurological:positive for mild headaches, and left side vision deficits. His left-sided weakness has nearly resolved. Denies numbness, tingling or new weaknesses Behavioral/Psych: Mood is stable, no new changes  All other systems were reviewed with the patient and are negative.  PHYSICAL EXAMINATION: ECOG PERFORMANCE STATUS: 1 - Symptomatic but completely ambulatory  KPS: 80%  Filed Vitals:   06/16/15 1405  BP: 129/80  Pulse: 80  Temp: 97.1 F (36.2 C)  Resp: 20   Filed Weights   06/16/15 1405  Weight: 238 lb 9.6 oz (108.228 kg)    GENERAL:alert, no distress and comfortable SKIN: skin color, texture, turgor are normal, no rashes or significant lesions HEAD: right-sided posterior craniotomy surgical wound is healing well, no surrounding edema, skin redness or discharge EYES: normal, conjunctiva are pink and non-injected, sclera clear OROPHARYNX:no exudate, no erythema and lips, buccal mucosa, and tongue normal  NECK: supple, thyroid normal size, non-tender, without nodularity LYMPH:  no palpable lymphadenopathy in the cervical, axillary or inguinal LUNGS: clear to auscultation and percussion with normal breathing effort HEART: regular rate & rhythm and no murmurs and no lower extremity edema ABDOMEN:abdomen soft, non-tender and normal bowel sounds Musculoskeletal:no cyanosis of digits and no clubbing  PSYCH: alert & oriented x 3 with fluent speech NEURO: cranial nerve 2-12 are intact. Left side vision deficits is present in the left upper and mid quadrant. Motor and sensitivity are normal and symmetric on all extremities. Speech is fluent, coordination is grossly intact. Ambulates independently and Lutricia Feil sign negative. EXT: Bilateral pitting edema in ankles, stable     LABORATORY DATA:  CBC Latest Ref Rng 06/08/2015 05/25/2015 05/12/2015  WBC 4.0 - 10.3 10e3/uL 6.4 5.9 5.0  Hemoglobin 13.0 - 17.1 g/dL 13.8 13.2 13.2  Hematocrit 38.4 - 49.9 % 41.8 39.8 40.0  Platelets 140 - 400 10e3/uL 219 234 206    CMP Latest Ref Rng 06/08/2015 05/25/2015 05/12/2015  Glucose 70 - 140 mg/dl 93 95 146(H)  BUN 7.0 - 26.0 mg/dL 10.6 12.5 10.0  Creatinine 0.7 - 1.3 mg/dL 1.0 1.0 0.9  Sodium 136 - 145 mEq/L 137 140 138  Potassium 3.5 - 5.1 mEq/L 4.0 3.9 3.6  Chloride 96 - 112 mEq/L - - -  CO2 22 - 29 mEq/L 26 25 23   Calcium 8.4 - 10.4 mg/dL 9.7 9.5 9.6  Total Protein 6.4 - 8.3 g/dL 6.4 6.0(L) 6.2(L)  Total Bilirubin 0.20 - 1.20 mg/dL 0.54 0.37 0.54  Alkaline Phos 40 - 150 U/L 64 72 70  AST 5 - 34 U/L 28 27 25   ALT 0 - 55 U/L 37 35 30     Surgical path 07/07/2014  Diagnosis 1. Brain, biopsy - GLIOBLASTOMA MULTIFORME, WHO GRADE IV/IV. - SEE ONCOLOGY TABLE BELOW. 2. Brain, for tumor resection, higher right parietal - GLIOBLASTOMA MULTIFORME, WHO GRADE IV/IV. - SEE ONCOLOGY TABLE BELOW. 3. Brain, for tumor resection, lower right occipital - GLIOBLASTOMA MULTIFORME, WHO GRADE IV/IV   Microscopic Comment 1. -3. ONCOLOGY TABLE - BRAIN AND SPINAL CORD 1. Procedure: Resection x2 2. Tumor site, including laterality: Right parietal and right occipital 3. Maximum tumor size (cm): At least 1.8 cm 4. Histologic type: Glioblastoma multiforme 5. Grade: WHO Grade IV/IV 6. Margins (if applicable): Can not be assessed 7. Ancillary studies: Can be performed upon clinician request.  RADIOGRAPHIC STUDIES: I have personally reviewed the radiological images as listed and agreed with the findings in the report.  [name] Contrast 06/02/2015 IMPRESSION: Further increased masslike T2 hyperintensity and irregular enhancement in the right occipital and posterior right temporal lobes since August again suggesting true progression of disease. Intracranial mass effect is  stable.  ASSESSMENT & PLAN:  Mr. Camps is a 58 year old gentleman with newly diagnosed right parietal GBM, status post subtotal resection.   1. GBM, s/p subtotal resection, and concurrent chemoRT completed on 09/15/2014, progressed on adjuvant Temodar.  -I reviewed his restaging brain MRI from 4.12.16,  previous 2 small foci of enhancement around the surgical site has significantly increased on the repeated ischemic. This was reviewed in our brain tumor board this morning, and we agreed this is tumor for progression. Unfortunately, due to the position of this 2 lesions, subtotal resection are unlikely and surgery is not offered -he is s/p 2 lines of chemo and avastin. -His recent restaging brain MRI from 06/02/2015 unfortunately showed further disease progression. This was reviewed with patient and his wife. I also discussed with [place] neuro oncologist Dr. Imagene Riches and she has put him on a clinical vaccin trial, likely start in mid or later Nov  -He will continue tapering steroids per [name]'s recommendation -cont Nexium daily when he is on steroids, vitamin the vitamin D and calcium, and regular exercise.  2. Anorexia, fatigue, nausea -Improved since off chemo  -Follow up with dietitian. Use Zofran as needed.  3. HTN and hypothyroidism  -continue follow up with PCP -TSH normal on 03/03/2015  4. Dental issue - he will  follow-up with Dr. Enrique Sack next week, has one more dental procedure.   Plan: -Hold any chemo for now, start clinical trial at [facility] in Nov  -RTC in 3 weeks for follow up  -I refilled his vicodin today   All questions were answered. The patient knows to call the clinic with any problems, questions or concerns.  I spent 20 minutes counseling the patient face to face. The total time spent in the appointmen was 25 minutes and more than 50% was on counseling.     Truitt Merle, MD 06/16/2015 10:29 PM

## 2015-06-16 NOTE — Telephone Encounter (Signed)
per pof to sch pt appt-sent MW emailt o sch pt trmt-pt to get updated sch 10/21

## 2015-06-19 ENCOUNTER — Telehealth: Payer: Self-pay | Admitting: *Deleted

## 2015-06-19 NOTE — Telephone Encounter (Signed)
Per staff message and POF I have scheduled appts. Advised scheduler of appts. JMW  

## 2015-06-23 ENCOUNTER — Other Ambulatory Visit: Payer: Self-pay | Admitting: *Deleted

## 2015-06-23 ENCOUNTER — Ambulatory Visit: Payer: Self-pay

## 2015-06-23 ENCOUNTER — Telehealth: Payer: Self-pay | Admitting: Hematology

## 2015-06-23 ENCOUNTER — Other Ambulatory Visit: Payer: Self-pay

## 2015-06-23 ENCOUNTER — Encounter: Payer: Self-pay | Admitting: Radiation Therapy

## 2015-06-23 NOTE — Progress Notes (Signed)
Jake [name] has elected to move forward with a clinical trial at [facility] rather than being treated with [place] for now. Since he will be followed by both Dr. Burr Medico and the doctors at [facility], Dr. Isidore Moos requested that she see Jake [name] on a PRN basis. The patient was very happy with this plan, but said he will miss seeing Dr. Isidore Moos regularly.   I have asked Jake [name] to call me if he would like to be seen by Dr. Isidore Moos.    Mont Dutton R.T. (R) (T) Radiation [facility] [phone] Office [phone] Pager [phone] Fax [email or web address]

## 2015-06-23 NOTE — Telephone Encounter (Signed)
cancel all chemo per pof,done

## 2015-06-29 ENCOUNTER — Telehealth: Payer: Self-pay | Admitting: *Deleted

## 2015-06-29 NOTE — Telephone Encounter (Signed)
Called pt & informed that h/a is likely related to decrease in steroid per Dr Burr Medico & she suggest that he contact [name] for instructions since he will participate in a clinical trial there. He states that he is down to 1/2 mg dex one day & off one day & alternating & h/a did seem to happen when he decreased dose last.  He is in agreement to call [name] & has contact #.  He will let us know what they tell him.

## 2015-06-29 NOTE — Telephone Encounter (Signed)
Call from spouse reporting "Levy is having new sharpe headaches the past week and a half he rates as a seven on pain scale.  Happens every morning after he's been up a while.  Please let Dr. Burr Medico know so an MRI to check if this is related to the brain tumor if needed."     Called Bronsyn([phone]) who reports "sudden occuring throbbing headache behind and above his right eye after I've drank my coffee and reading.  I take hydrocodone but it takes several hours before it is a dull ache and diminishes.  It's not from the surgical area.  No visual changes, B/P controlled at 120/75 to 80 using lisinopril and verapamil.  Slight nausea trying to taper off dexamethasone.  Coffee has caffeine and I have a hard time drinking water."  This nurse will notify Dr. Burr Medico.  Next scheduled F/U is 07-07-2015.  Advised he switch to decaffeinated coffee and Drink more water.

## 2015-07-02 ENCOUNTER — Emergency Department (HOSPITAL_COMMUNITY): Payer: BLUE CROSS/BLUE SHIELD

## 2015-07-02 ENCOUNTER — Emergency Department (HOSPITAL_COMMUNITY)
Admission: EM | Admit: 2015-07-02 | Discharge: 2015-07-02 | Disposition: A | Discharge status: Home / Self Care | Payer: BLUE CROSS/BLUE SHIELD | Attending: Emergency Medicine | Admitting: Emergency Medicine

## 2015-07-02 ENCOUNTER — Encounter (HOSPITAL_COMMUNITY): Payer: Self-pay | Admitting: *Deleted

## 2015-07-02 DIAGNOSIS — Z923 Personal history of irradiation: Secondary | ICD-10-CM | POA: Diagnosis not present

## 2015-07-02 DIAGNOSIS — R519 Headache, unspecified: Secondary | ICD-10-CM

## 2015-07-02 DIAGNOSIS — R51 Headache: Secondary | ICD-10-CM | POA: Insufficient documentation

## 2015-07-02 DIAGNOSIS — E079 Disorder of thyroid, unspecified: Secondary | ICD-10-CM | POA: Diagnosis not present

## 2015-07-02 DIAGNOSIS — M1990 Unspecified osteoarthritis, unspecified site: Secondary | ICD-10-CM | POA: Insufficient documentation

## 2015-07-02 DIAGNOSIS — Z793 Long term (current) use of hormonal contraceptives: Secondary | ICD-10-CM | POA: Insufficient documentation

## 2015-07-02 DIAGNOSIS — Z87442 Personal history of urinary calculi: Secondary | ICD-10-CM | POA: Diagnosis not present

## 2015-07-02 DIAGNOSIS — I10 Essential (primary) hypertension: Secondary | ICD-10-CM | POA: Insufficient documentation

## 2015-07-02 DIAGNOSIS — Z8639 Personal history of other endocrine, nutritional and metabolic disease: Secondary | ICD-10-CM | POA: Diagnosis not present

## 2015-07-02 DIAGNOSIS — Z720 Tobacco use: Secondary | ICD-10-CM | POA: Diagnosis not present

## 2015-07-02 DIAGNOSIS — Z79899 Other long term (current) drug therapy: Secondary | ICD-10-CM | POA: Insufficient documentation

## 2015-07-02 DIAGNOSIS — Z8669 Personal history of other diseases of the nervous system and sense organs: Secondary | ICD-10-CM | POA: Insufficient documentation

## 2015-07-02 MED ORDER — KETOROLAC TROMETHAMINE 10 MG PO TABS: 10.0000 mg | ORAL_TABLET | Freq: Three times a day (TID) | ORAL | Status: DC | PRN

## 2015-07-02 MED ORDER — METOCLOPRAMIDE HCL 10 MG PO TABS: 10.0000 mg | ORAL_TABLET | Freq: Four times a day (QID) | ORAL | Status: DC

## 2015-07-02 MED ORDER — DIPHENHYDRAMINE HCL 25 MG PO CAPS
25.0000 mg | ORAL_CAPSULE | Freq: Once | ORAL | Status: AC
  Administered 2015-07-02: 25 mg via ORAL
  Filled 2015-07-02: qty 1

## 2015-07-02 MED ORDER — DIPHENHYDRAMINE HCL 25 MG PO TABS: 25.0000 mg | ORAL_TABLET | Freq: Four times a day (QID) | ORAL | Status: DC

## 2015-07-02 MED ORDER — KETOROLAC TROMETHAMINE 30 MG/ML IJ SOLN
30.0000 mg | Freq: Once | INTRAMUSCULAR | Status: AC
  Administered 2015-07-02: 30 mg via INTRAMUSCULAR
  Filled 2015-07-02: qty 1

## 2015-07-02 MED ORDER — METOCLOPRAMIDE HCL 10 MG PO TABS
10.0000 mg | ORAL_TABLET | Freq: Once | ORAL | Status: AC
  Administered 2015-07-02: 10 mg via ORAL
  Filled 2015-07-02: qty 1

## 2015-07-02 NOTE — ED Notes (Signed)
Patient transported to CT 

## 2015-07-02 NOTE — Discharge Instructions (Signed)
Please use medication as directed. Please call Dr. Burr Medico first thing tomorrow morning and inform them of today's visit and all relevant data. If new or worsening signs or symptoms present please return to the emergency room for further evaluation and management.

## 2015-07-02 NOTE — ED Provider Notes (Signed)
CSN: [id number]     Arrival date & time 07/02/15  1234 History   First MD Initiated Contact with Patient 07/02/15 1336     Chief Complaint  Patient presents with  . Headache   HPI   59 year old male with a significant past medical history of glioblastoma presents today with right-sided headaches. Patient was initially diagnosed in October 2015 with headaches, followed with CT scan and MRI. He was underwent craniotomy at that time with subsequent chemotherapy. Patient has continued progression, recently discontinued chemotherapy, is awaiting clinical trial at [facility]. Patient reports that originally his headache symptoms were on the left with the original glioblastoma, but reports today's presentation is on the right. Patient notes 2 weeks of symptoms, contacted Dr. Annamaria Boots who suggested he contact [name] for instruction since he will be participating in the clinical trial. He reports that he's been tapering off of oral steroid dose, down to 1/2 mg dexamethasone a day, off 1 day. He reports headache started at that time he began decreasing his dose. He reports that [facility] oncology prescribed him Elavil to take at night, this has not improved symptoms.   Patient reports 2 week history of a throbbing headache behind his right eye, with no radiation of symptoms. Patient denies any focal neurological deficits, fever or chills, trauma to the head, neck stiffness, any other concerning signs or symptoms at this time. Patient reports he takes hydrocodone which "takes the edge off" but does not abort headaches.   Follow-up appointment with Dr. Marc Morgans 07/07/2015   Past Medical History  Diagnosis Date  . Thyroid disease   . Hypertension   . Allergy   . Arthritis   . Sleep apnea   . Kidney stone   . Glioblastoma multiforme of occipital lobe ([place])   . Nocturnal leg cramps 09/29/2014  . History of radiation therapy 08/03/14- 09/15/14    right occipito-parietal brain/46 Gy 23 fx, right brain boost/ 14 Gy in 7 fx    Past Surgical History  Procedure Laterality Date  . Knee arthroscopy w/ meniscal repair    . Treatment fistula anal    . Craniotomy N/A 07/07/2014    Procedure: CRANIOTOMY TUMOR EXCISION/CURVE;  Surgeon: Elaina Hoops, MD;  Location: [place] [facility];  Service: Neurosurgery;  Laterality: N/A;   Family History  Problem Relation Age of Onset  . Hypertension Mother   . Diabetes Father   . Glaucoma Father   . Diabetes Brother   . Diabetes Brother   . Colon cancer Neg Hx    Social History  Substance Use Topics  . Smoking status: Current Every Day Smoker -- 1.00 packs/day for 45 years    Types: Cigarettes  . Smokeless tobacco: Never Used  . Alcohol Use: 0.0 oz/week    0 Standard drinks or equivalent per week     Comment: 2-3 beers per/day     Review of Systems  All other systems reviewed and are negative.   Allergies  Other  Home Medications   Prior to Admission medications   Medication Sig Start Date End Date Taking? Authorizing Provider  amitriptyline (ELAVIL) 25 MG tablet Take 12.5 mg by mouth at bedtime. Increase to 1 tablet after 1 week 06/30/15  Yes Historical Provider, MD  chlorhexidine (PERIDEX) 0.12 % solution Rinse with 15 mls three daily for 30 seconds. Use after breakfast, lunch and at bedtime. Spit out excess. Do not swallow. 05/10/15  Yes Lenn Cal, DDS  clonazePAM (KLONOPIN) 1 MG tablet Take 1-2 tablets (1-2  mg total) by mouth at bedtime. 03/03/15  Yes Shawnee Knapp, MD  dexamethasone (DECADRON) 0.5 MG tablet Take 1.5 mg daily x2weeks, then 1 mg daily x2weeks, then 0.5 mg daily x2 weeks, then taper off. 8/6-8/19 dexamethasone 1.5mg  in the am 8/20-9/2 dexamethasone 1 mg in the am 9/3-9/16 dexamethasone 0.5mg  in am 9/17-9/23 dexamethasone 0.5 mg every other day 9/24 stop dexamethasone 06/16/15 back on 0.5 mg x 2 days & off 1 day 04/07/15  Yes Historical Provider, MD  docusate sodium (COLACE) 100 MG capsule Take 100 mg by mouth daily.    Yes Historical Provider, MD   HYDROcodone-acetaminophen (NORCO/VICODIN) 5-325 MG tablet Take 1 tablet by mouth every 6 (six) hours as needed for moderate pain. 06/16/15  Yes Truitt Merle, MD  hydrocortisone (CORTEF) 10 MG tablet Take 20 mg (2 tabs) in AM, 10 mg (1 tab) in PM 04/07/15  Yes Historical Provider, MD  levothyroxine (SYNTHROID, LEVOTHROID) 50 MCG tablet Take 1 tablet (50 mcg total) by mouth daily before breakfast. 03/04/15  Yes Shawnee Knapp, MD  lisinopril (PRINIVIL,ZESTRIL) 40 MG tablet Take 1 tablet (40 mg total) by mouth daily. 03/04/15  Yes Shawnee Knapp, MD  loperamide (IMODIUM) 2 MG capsule Take 1 capsule (2 mg total) by mouth as needed for diarrhea or loose stools. 05/26/15  Yes Truitt Merle, MD  LORazepam (ATIVAN) 0.5 MG tablet take 1 to 2 tablets by mouth every 8 hours if needed for anxiety nausea or sleep 05/26/15  Yes Truitt Merle, MD  Multiple Vitamins-Minerals (MULTIVITAMIN WITH MINERALS) tablet Take 1 tablet by mouth daily.   Yes Historical Provider, MD  NEXIUM 24HR 20 MG capsule Take 20 mg by mouth daily.  10/21/14  Yes Historical Provider, MD  ondansetron (ZOFRAN) 8 MG tablet Take 1 tablet (8 mg total) by mouth every 8 (eight) hours as needed for nausea or vomiting. 07/23/14  Yes Truitt Merle, MD  Probiotic Product (PROBIOTIC DAILY PO) Take 1 capsule by mouth daily.    Yes Historical Provider, MD  senna (SENOKOT) 8.6 MG tablet Take 1 tablet by mouth at bedtime.    Yes Historical Provider, MD  verapamil (COVERA HS) 180 MG (CO) 24 hr tablet Take 1 tablet (180 mg total) by mouth at bedtime. 03/04/15  Yes Shawnee Knapp, MD  diphenhydrAMINE (BENADRYL) 25 MG tablet Take 1 tablet (25 mg total) by mouth every 6 (six) hours. 07/02/15   Okey Regal, PA-C  ketorolac (TORADOL) 10 MG tablet Take 1 tablet (10 mg total) by mouth every 8 (eight) hours as needed. 07/02/15   Okey Regal, PA-C  metoCLOPramide (REGLAN) 10 MG tablet Take 1 tablet (10 mg total) by mouth every 6 (six) hours. 07/02/15   Quayshawn Nin, PA-C   BP 135/81 mmHg  Pulse 80   Temp(Src) 98.3 F (36.8 C) (Oral)  Resp 18  SpO2 96%   Physical Exam  Constitutional: He is oriented to person, place, and time. He appears well-developed and well-nourished.  HENT:  Head: Normocephalic and atraumatic.  Eyes: Conjunctivae are normal. Pupils are equal, round, and reactive to light. Right eye exhibits no discharge. Left eye exhibits no discharge. No scleral icterus.  Neck: Normal range of motion. No JVD present. No tracheal deviation present.  Cardiovascular: Normal rate, normal heart sounds and intact distal pulses.  Exam reveals no gallop and no friction rub.   No murmur heard. Pulmonary/Chest: Effort normal and breath sounds normal. No stridor. No respiratory distress. He has no wheezes. He has no rales. He  exhibits no tenderness.  Abdominal: Soft. Bowel sounds are normal. He exhibits no distension and no mass. There is no tenderness. There is no rebound and no guarding.  Musculoskeletal: Normal range of motion. He exhibits no edema or tenderness.  Neurological: He is alert and oriented to person, place, and time. He has normal strength. No cranial nerve deficit or sensory deficit. He displays a negative Romberg sign. Coordination and gait normal. GCS eye subscore is 4. GCS verbal subscore is 5. GCS motor subscore is 6.  Reflex Scores:      Patellar reflexes are 2+ on the right side and 2+ on the left side. Skin: Skin is warm and dry. No rash noted. No erythema. No pallor.  Psychiatric: He has a normal mood and affect. His behavior is normal. Judgment and thought content normal.  Nursing note and vitals reviewed.   ED Course  Procedures (including critical care time) Labs Review Labs Reviewed - No data to display  Imaging Review No results found. I have personally reviewed and evaluated these images and lab results as part of my medical decision-making.   EKG Interpretation None      MDM   Final diagnoses:  Headache   Labs:  Imaging: CT head without  contrast  Consults:  Therapeutics: Toradol, Benadryl, Reglan  Discharge Meds:   Assessment/Plan: Pt presents to the ED with complaint of headache. History of the same at time of presentation of glioblastoma. Patient's headache symptoms started around the time he began to taper from prednisone, likely the cause. Most recent MRI brain was on 06/02/15 that showed progression of heterogeneous mass involving in the entire right occipital lobe and tracking anteriorly into the posterior right temporal lobe. Pt care transferred to oncoming provider pending CT scan.          Okey Regal, PA-C 07/02/15 [place], MD 07/03/15 [phone]

## 2015-07-02 NOTE — ED Provider Notes (Signed)
I assumed care of Jake [name] from [employer], PA-C at their end-of-shift check-out. Patient is a 59 y.o. male who presented for evaluation of Headache  I have reviewed their documentation of the patient's HPI, ROS, Physical Exam.  Please refer to their MDM and documentation for course prior to now. Treatments, Labs, and Imaging personally viewed by myself & considered in my MDM.  BP 133/84 mmHg  Pulse 76  Temp(Src) 98.3 F (36.8 C) (Oral)  Resp 18  SpO2 94% Results for orders placed or performed in visit on 06/08/15  CBC with Differential  Result Value Ref Range   WBC 6.4 4.0 - 10.3 10e3/uL   NEUT# 4.1 1.5 - 6.5 10e3/uL   HGB 13.8 13.0 - 17.1 g/dL   HCT 41.8 38.4 - 49.9 %   Platelets 219 140 - 400 10e3/uL   MCV 90.2 79.3 - 98.0 fL   MCH 29.7 27.2 - 33.4 pg   MCHC 33.0 32.0 - 36.0 g/dL   RBC 4.63 4.20 - 5.82 10e6/uL   RDW 20.2 (H) 11.0 - 14.6 %   lymph# 1.5 0.9 - 3.3 10e3/uL   MONO# 0.7 0.1 - 0.9 10e3/uL   Eosinophils Absolute 0.1 0.0 - 0.5 10e3/uL   Basophils Absolute 0.1 0.0 - 0.1 10e3/uL   NEUT% 64.5 39.0 - 75.0 %   LYMPH% 22.6 14.0 - 49.0 %   MONO% 10.9 0.0 - 14.0 %   EOS% 1.2 0.0 - 7.0 %   BASO% 0.8 0.0 - 2.0 %  Comprehensive metabolic panel (Cmet) - [facility]  Result Value Ref Range   Sodium 137 136 - 145 mEq/L   Potassium 4.0 3.5 - 5.1 mEq/L   Chloride 105 98 - 109 mEq/L   CO2 26 22 - 29 mEq/L   Glucose 93 70 - 140 mg/dl   BUN 10.6 7.0 - 26.0 mg/dL   Creatinine 1.0 0.7 - 1.3 mg/dL   Total Bilirubin 0.54 0.20 - 1.20 mg/dL   Alkaline Phosphatase 64 40 - 150 U/L   AST 28 5 - 34 U/L   ALT 37 0 - 55 U/L   Total Protein 6.4 6.4 - 8.3 g/dL   Albumin 3.5 3.5 - 5.0 g/dL   Calcium 9.7 8.4 - 10.4 mg/dL   Anion Gap 7 3 - 11 mEq/L   EGFR 81 (L) >90 ml/min/1.73 m2   Ct Head Wo Contrast  07/02/2015  CLINICAL DATA:  Headaches x several weeks, known brain tumor EXAM: CT HEAD WITHOUT CONTRAST TECHNIQUE: Contiguous axial images were obtained from the base of the skull  through the vertex without intravenous contrast. COMPARISON:  MRI brain dated 06/02/2015 FINDINGS: 8.3 x 5.0 cm hypodense mass along the posterior right temporal and right occipital lobes (series 201/ image 14), grossly unchanged from prior MRI. Associated mass effect. Low-density extends into the subcortical right parietal lobe and possibly the posterior frontal lobe (series 201/ image 22), progressed from recent MRI. Mass effect on the temporal horn of the right lateral ventricle. No hydrocephalus. No definite midline shift. No evidence of parenchymal hemorrhage or extra-axial fluid collection. No CT evidence of acute infarction. Cerebral volume is within normal limits.  No ventriculomegaly. The visualized paranasal sinuses are essentially clear. The mastoid air cells are unopacified. No evidence of calvarial fracture. IMPRESSION: 8.3 x 5.0 cm hypodense mass along the posterior right temporal and right occipital lobes, grossly unchanged, but with progression of low density extending anteriorly into the right parietal and frontal lobes. This appearance is worrisome for infiltrating  tumor progression. Stable mass effect on the right lateral ventricle. No hydrocephalus. No definite midline shift. Electronically Signed   By: Julian Hy M.D.   On: 07/02/2015 16:56   Upon assumption of the patient's care, they require CT Head to monitor for mass affect changes from prior imaging and should this be negative for progression patient will not require further assessment in the ED at this time. Close follow up arranged with patient's Oncologist.  ED course update, reveals CT Head reveals:  CT "grossly unchanged, but with progression of low density extending anteriorly into the right parietal and frontal lobes. This appearance is worrisome for infiltrating tumor progression. Stable mass effect on the right lateral ventricle. No hydrocephalus. No definite midline shift."  Patient well appearing and has no further  questions or concerns prior to discharge. HA controlled at this time. Discharged with RX regimen per Okey Regal, PA-C.  Clinical Impression:  1. Headache    Disposition:  Patient to follow up with PCP and Oncologist as needed and return precautions discussed for worsening or new concerning symptoms. Patient care discussed with Dr. Vanita Panda, who oversaw their evaluation & treatment & voiced agreement. House Officer: Voncille Lo, MD, Emergency Medicine.  Voncille Lo, MD 07/02/15 [place], MD 07/03/15 [phone]

## 2015-07-02 NOTE — ED Notes (Signed)
Pt is here with a couple weeks of headaches and hydrocodone is not working.  Pt is having pain to right eye and behind it with pain 8/10.  Pt had brain tumor removed last Halloween and now these headaches are worse. PT states the headache has been consistent for 10 days.  Pt states the right eye is blurriness is not new

## 2015-07-02 NOTE — ED Notes (Signed)
Family contact Mr Leotis Shames [phone].

## 2015-07-02 NOTE — ED Notes (Signed)
Pt remains monitored by blood pressure and pulse ox. Pts family remains at bedside.  

## 2015-07-03 ENCOUNTER — Telehealth: Payer: Self-pay

## 2015-07-03 ENCOUNTER — Observation Stay (HOSPITAL_COMMUNITY)
Admission: EM | Admit: 2015-07-03 | Discharge: 2015-07-04 | Disposition: A | Discharge status: Home / Self Care | Payer: BLUE CROSS/BLUE SHIELD | Attending: Internal Medicine | Admitting: Internal Medicine

## 2015-07-03 ENCOUNTER — Emergency Department (HOSPITAL_COMMUNITY): Payer: BLUE CROSS/BLUE SHIELD

## 2015-07-03 ENCOUNTER — Encounter (HOSPITAL_COMMUNITY): Payer: Self-pay | Admitting: Emergency Medicine

## 2015-07-03 DIAGNOSIS — G4762 Sleep related leg cramps: Secondary | ICD-10-CM | POA: Diagnosis not present

## 2015-07-03 DIAGNOSIS — Z720 Tobacco use: Secondary | ICD-10-CM | POA: Insufficient documentation

## 2015-07-03 DIAGNOSIS — R51 Headache: Principal | ICD-10-CM | POA: Insufficient documentation

## 2015-07-03 DIAGNOSIS — F329 Major depressive disorder, single episode, unspecified: Secondary | ICD-10-CM | POA: Diagnosis present

## 2015-07-03 DIAGNOSIS — R5383 Other fatigue: Secondary | ICD-10-CM

## 2015-07-03 DIAGNOSIS — M791 Myalgia: Secondary | ICD-10-CM | POA: Insufficient documentation

## 2015-07-03 DIAGNOSIS — R509 Fever, unspecified: Secondary | ICD-10-CM | POA: Diagnosis present

## 2015-07-03 DIAGNOSIS — C714 Malignant neoplasm of occipital lobe: Secondary | ICD-10-CM | POA: Diagnosis not present

## 2015-07-03 DIAGNOSIS — M1990 Unspecified osteoarthritis, unspecified site: Secondary | ICD-10-CM | POA: Diagnosis not present

## 2015-07-03 DIAGNOSIS — E079 Disorder of thyroid, unspecified: Secondary | ICD-10-CM | POA: Diagnosis not present

## 2015-07-03 DIAGNOSIS — I10 Essential (primary) hypertension: Secondary | ICD-10-CM | POA: Diagnosis present

## 2015-07-03 DIAGNOSIS — Z79899 Other long term (current) drug therapy: Secondary | ICD-10-CM | POA: Insufficient documentation

## 2015-07-03 DIAGNOSIS — G4730 Sleep apnea, unspecified: Secondary | ICD-10-CM | POA: Insufficient documentation

## 2015-07-03 DIAGNOSIS — R519 Headache, unspecified: Secondary | ICD-10-CM | POA: Diagnosis present

## 2015-07-03 DIAGNOSIS — E038 Other specified hypothyroidism: Secondary | ICD-10-CM

## 2015-07-03 DIAGNOSIS — D496 Neoplasm of unspecified behavior of brain: Secondary | ICD-10-CM | POA: Diagnosis present

## 2015-07-03 DIAGNOSIS — F32A Depression, unspecified: Secondary | ICD-10-CM | POA: Diagnosis present

## 2015-07-03 DIAGNOSIS — C713 Malignant neoplasm of parietal lobe: Secondary | ICD-10-CM | POA: Diagnosis not present

## 2015-07-03 DIAGNOSIS — E039 Hypothyroidism, unspecified: Secondary | ICD-10-CM | POA: Diagnosis present

## 2015-07-03 DIAGNOSIS — R630 Anorexia: Secondary | ICD-10-CM

## 2015-07-03 DIAGNOSIS — C719 Malignant neoplasm of brain, unspecified: Secondary | ICD-10-CM | POA: Diagnosis present

## 2015-07-03 LAB — COMPREHENSIVE METABOLIC PANEL
ALK PHOS: 52 U/L (ref 38–126)
ALT: 21 U/L (ref 17–63)
AST: 24 U/L (ref 15–41)
Albumin: 3.3 g/dL — ABNORMAL LOW (ref 3.5–5.0)
Anion gap: 7 (ref 5–15)
BILIRUBIN TOTAL: 0.6 mg/dL (ref 0.3–1.2)
BUN: 15 mg/dL (ref 6–20)
CALCIUM: 9 mg/dL (ref 8.9–10.3)
CO2: 24 mmol/L (ref 22–32)
CREATININE: 1.04 mg/dL (ref 0.61–1.24)
Chloride: 105 mmol/L (ref 101–111)
Glucose, Bld: 92 mg/dL (ref 65–99)
Potassium: 3.4 mmol/L — ABNORMAL LOW (ref 3.5–5.1)
Sodium: 136 mmol/L (ref 135–145)
Total Protein: 6.3 g/dL — ABNORMAL LOW (ref 6.5–8.1)

## 2015-07-03 LAB — URINALYSIS, ROUTINE W REFLEX MICROSCOPIC
Bilirubin Urine: NEGATIVE
Glucose, UA: NEGATIVE mg/dL
Hgb urine dipstick: NEGATIVE
Ketones, ur: NEGATIVE mg/dL
Leukocytes, UA: NEGATIVE
NITRITE: NEGATIVE
Protein, ur: NEGATIVE mg/dL
SPECIFIC GRAVITY, URINE: 1.015 (ref 1.005–1.030)
UROBILINOGEN UA: 0.2 mg/dL (ref 0.0–1.0)
pH: 6 (ref 5.0–8.0)

## 2015-07-03 LAB — CBC WITH DIFFERENTIAL/PLATELET
BASOS ABS: 0 10*3/uL (ref 0.0–0.1)
Basophils Relative: 0 %
Eosinophils Absolute: 0 10*3/uL (ref 0.0–0.7)
Eosinophils Relative: 0 %
HEMATOCRIT: 41.1 % (ref 39.0–52.0)
HEMOGLOBIN: 13.2 g/dL (ref 13.0–17.0)
LYMPHS PCT: 18 %
Lymphs Abs: 1.8 10*3/uL (ref 0.7–4.0)
MCH: 29.6 pg (ref 26.0–34.0)
MCHC: 32.1 g/dL (ref 30.0–36.0)
MCV: 92.2 fL (ref 78.0–100.0)
Monocytes Absolute: 0.9 10*3/uL (ref 0.1–1.0)
Monocytes Relative: 9 %
NEUTROS ABS: 7.2 10*3/uL (ref 1.7–7.7)
NEUTROS PCT: 73 %
PLATELETS: 194 10*3/uL (ref 150–400)
RBC: 4.46 MIL/uL (ref 4.22–5.81)
RDW: 18.4 % — ABNORMAL HIGH (ref 11.5–15.5)
WBC: 10 10*3/uL (ref 4.0–10.5)

## 2015-07-03 LAB — I-STAT CG4 LACTIC ACID, ED
LACTIC ACID, VENOUS: 1.24 mmol/L (ref 0.5–2.0)
Lactic Acid, Venous: 0.54 mmol/L (ref 0.5–2.0)

## 2015-07-03 LAB — INFLUENZA PANEL BY PCR (TYPE A & B)
H1N1FLUPCR: NOT DETECTED
INFLBPCR: NEGATIVE
Influenza A By PCR: NEGATIVE

## 2015-07-03 MED ORDER — SENNA 8.6 MG PO TABS
1.0000 | ORAL_TABLET | Freq: Every day | ORAL | Status: DC
  Administered 2015-07-03: 8.6 mg via ORAL
  Filled 2015-07-03: qty 1

## 2015-07-03 MED ORDER — ACETAMINOPHEN 325 MG PO TABS: 650.0000 mg | ORAL_TABLET | Freq: Four times a day (QID) | ORAL | Status: DC | PRN

## 2015-07-03 MED ORDER — PROCHLORPERAZINE EDISYLATE 5 MG/ML IJ SOLN
10.0000 mg | Freq: Four times a day (QID) | INTRAMUSCULAR | Status: DC | PRN
Start: 2015-07-03 — End: 2015-07-03
  Administered 2015-07-03: 10 mg via INTRAVENOUS
  Filled 2015-07-03: qty 2

## 2015-07-03 MED ORDER — ACETAMINOPHEN 325 MG PO TABS
650.0000 mg | ORAL_TABLET | Freq: Once | ORAL | Status: AC | PRN
  Administered 2015-07-03: 650 mg via ORAL
  Filled 2015-07-03: qty 2

## 2015-07-03 MED ORDER — LORAZEPAM 0.5 MG PO TABS: 0.5000 mg | ORAL_TABLET | Freq: Three times a day (TID) | ORAL | Status: DC | PRN

## 2015-07-03 MED ORDER — ACETAMINOPHEN 650 MG RE SUPP: 650.0000 mg | Freq: Four times a day (QID) | RECTAL | Status: DC | PRN

## 2015-07-03 MED ORDER — METOCLOPRAMIDE HCL 5 MG/ML IJ SOLN
10.0000 mg | Freq: Once | INTRAMUSCULAR | Status: AC
  Administered 2015-07-03: 10 mg via INTRAVENOUS
  Filled 2015-07-03: qty 2

## 2015-07-03 MED ORDER — ONDANSETRON HCL 8 MG PO TABS: 8.0000 mg | ORAL_TABLET | Freq: Three times a day (TID) | ORAL | Status: DC | PRN

## 2015-07-03 MED ORDER — HYDROMORPHONE HCL 1 MG/ML IJ SOLN
1.0000 mg | INTRAMUSCULAR | Status: DC | PRN
  Filled 2015-07-03: qty 1

## 2015-07-03 MED ORDER — HYDROMORPHONE HCL 1 MG/ML IJ SOLN
1.0000 mg | Freq: Once | INTRAMUSCULAR | Status: AC
  Administered 2015-07-03: 1 mg via INTRAVENOUS
  Filled 2015-07-03: qty 1

## 2015-07-03 MED ORDER — HYDROCORTISONE 20 MG PO TABS: 20.0000 mg | ORAL_TABLET | Freq: Every day | ORAL | Status: DC

## 2015-07-03 MED ORDER — DIPHENHYDRAMINE HCL 25 MG PO CAPS
25.0000 mg | ORAL_CAPSULE | Freq: Four times a day (QID) | ORAL | Status: DC
  Administered 2015-07-03 – 2015-07-04 (×3): 25 mg via ORAL
  Filled 2015-07-03 (×3): qty 1

## 2015-07-03 MED ORDER — DEXAMETHASONE SODIUM PHOSPHATE 4 MG/ML IJ SOLN: 4.0000 mg | Freq: Two times a day (BID) | INTRAMUSCULAR | Status: DC

## 2015-07-03 MED ORDER — ADULT MULTIVITAMIN W/MINERALS CH
1.0000 | ORAL_TABLET | Freq: Every day | ORAL | Status: DC
  Administered 2015-07-03 – 2015-07-04 (×2): 1 via ORAL
  Filled 2015-07-03: qty 1

## 2015-07-03 MED ORDER — HYDROCODONE-ACETAMINOPHEN 5-325 MG PO TABS: 1.0000 | ORAL_TABLET | Freq: Four times a day (QID) | ORAL | Status: DC | PRN

## 2015-07-03 MED ORDER — LOPERAMIDE HCL 2 MG PO CAPS: 2.0000 mg | ORAL_CAPSULE | ORAL | Status: DC | PRN

## 2015-07-03 MED ORDER — GADOBENATE DIMEGLUMINE 529 MG/ML IV SOLN
20.0000 mL | Freq: Once | INTRAVENOUS | Status: AC | PRN
  Administered 2015-07-03: 20 mL via INTRAVENOUS

## 2015-07-03 MED ORDER — LEVOTHYROXINE SODIUM 50 MCG PO TABS
50.0000 ug | ORAL_TABLET | Freq: Every day | ORAL | Status: DC
  Administered 2015-07-04: 50 ug via ORAL
  Filled 2015-07-03 (×2): qty 1

## 2015-07-03 MED ORDER — DIPHENHYDRAMINE HCL 50 MG/ML IJ SOLN
25.0000 mg | Freq: Once | INTRAMUSCULAR | Status: AC
  Administered 2015-07-03: 25 mg via INTRAVENOUS
  Filled 2015-07-03: qty 1

## 2015-07-03 MED ORDER — DEXAMETHASONE 0.5 MG PO TABS
0.5000 mg | ORAL_TABLET | Freq: Once | ORAL | Status: AC
  Administered 2015-07-03: 0.5 mg via ORAL
  Filled 2015-07-03: qty 1

## 2015-07-03 MED ORDER — LISINOPRIL 20 MG PO TABS
40.0000 mg | ORAL_TABLET | Freq: Every day | ORAL | Status: DC
  Administered 2015-07-03 – 2015-07-04 (×2): 40 mg via ORAL
  Filled 2015-07-03: qty 1
  Filled 2015-07-03 (×2): qty 2

## 2015-07-03 MED ORDER — DEXAMETHASONE SODIUM PHOSPHATE 4 MG/ML IJ SOLN
4.0000 mg | Freq: Four times a day (QID) | INTRAMUSCULAR | Status: DC
  Administered 2015-07-03 – 2015-07-04 (×3): 4 mg via INTRAVENOUS
  Filled 2015-07-03 (×3): qty 1

## 2015-07-03 MED ORDER — DEXAMETHASONE SODIUM PHOSPHATE 4 MG/ML IJ SOLN
4.0000 mg | Freq: Once | INTRAMUSCULAR | Status: AC
  Administered 2015-07-03: 4 mg via INTRAVENOUS
  Filled 2015-07-03: qty 1

## 2015-07-03 MED ORDER — DIPHENHYDRAMINE HCL 25 MG PO TABS
25.0000 mg | ORAL_TABLET | Freq: Four times a day (QID) | ORAL | Status: DC
  Filled 2015-07-03 (×3): qty 1

## 2015-07-03 MED ORDER — DIPHENHYDRAMINE HCL 50 MG/ML IJ SOLN
25.0000 mg | Freq: Once | INTRAMUSCULAR | Status: AC
  Administered 2015-07-03: 11:00:00 via INTRAVENOUS
  Filled 2015-07-03: qty 1

## 2015-07-03 MED ORDER — SACCHAROMYCES BOULARDII 250 MG PO CAPS
250.0000 mg | ORAL_CAPSULE | Freq: Two times a day (BID) | ORAL | Status: DC
  Administered 2015-07-03 – 2015-07-04 (×2): 250 mg via ORAL
  Filled 2015-07-03 (×3): qty 1

## 2015-07-03 MED ORDER — SODIUM CHLORIDE 0.9 % IV BOLUS (SEPSIS)
1000.0000 mL | Freq: Once | INTRAVENOUS | Status: AC
  Administered 2015-07-03: 1000 mL via INTRAVENOUS

## 2015-07-03 MED ORDER — AMITRIPTYLINE HCL 25 MG PO TABS
12.5000 mg | ORAL_TABLET | Freq: Every day | ORAL | Status: DC
  Administered 2015-07-03: 12.5 mg via ORAL
  Filled 2015-07-03: qty 1

## 2015-07-03 MED ORDER — DOCUSATE SODIUM 100 MG PO CAPS: 100.0000 mg | ORAL_CAPSULE | Freq: Every day | ORAL | Status: DC

## 2015-07-03 MED ORDER — SODIUM CHLORIDE 0.9 % IV SOLN
INTRAVENOUS | Status: DC
  Administered 2015-07-03 (×2): via INTRAVENOUS

## 2015-07-03 MED ORDER — VERAPAMIL HCL ER 180 MG PO TBCR
180.0000 mg | EXTENDED_RELEASE_TABLET | Freq: Every day | ORAL | Status: DC
  Administered 2015-07-03: 180 mg via ORAL
  Filled 2015-07-03 (×2): qty 1

## 2015-07-03 MED ORDER — HYDROCORTISONE 20 MG PO TABS
20.0000 mg | ORAL_TABLET | Freq: Every day | ORAL | Status: DC
  Administered 2015-07-03: 20 mg via ORAL
  Filled 2015-07-03: qty 1

## 2015-07-03 MED ORDER — VERAPAMIL HCL 180 MG (CO) PO TB24: 180.0000 mg | ORAL_TABLET | Freq: Every day | ORAL | Status: DC

## 2015-07-03 MED ORDER — PANTOPRAZOLE SODIUM 40 MG PO TBEC
40.0000 mg | DELAYED_RELEASE_TABLET | Freq: Every day | ORAL | Status: DC
  Administered 2015-07-03 – 2015-07-04 (×2): 40 mg via ORAL
  Filled 2015-07-03 (×2): qty 1

## 2015-07-03 MED ORDER — METOCLOPRAMIDE HCL 10 MG PO TABS
10.0000 mg | ORAL_TABLET | Freq: Four times a day (QID) | ORAL | Status: DC
  Administered 2015-07-03 – 2015-07-04 (×4): 10 mg via ORAL
  Filled 2015-07-03 (×4): qty 1

## 2015-07-03 MED ORDER — MULTI-VITAMIN/MINERALS PO TABS: 1.0000 | ORAL_TABLET | Freq: Every day | ORAL | Status: DC

## 2015-07-03 MED ORDER — HYDROCORTISONE 10 MG PO TABS: 10.0000 mg | ORAL_TABLET | Freq: Every day | ORAL | Status: DC

## 2015-07-03 MED ORDER — KETOROLAC TROMETHAMINE 10 MG PO TABS
10.0000 mg | ORAL_TABLET | Freq: Three times a day (TID) | ORAL | Status: DC | PRN
  Filled 2015-07-03: qty 1

## 2015-07-03 MED ORDER — PROBIOTIC DAILY PO CAPS: ORAL_CAPSULE | Freq: Every day | ORAL | Status: DC

## 2015-07-03 MED ORDER — CHLORHEXIDINE GLUCONATE 0.12 % MT SOLN
15.0000 mL | Freq: Four times a day (QID) | OROMUCOSAL | Status: DC
  Administered 2015-07-03 – 2015-07-04 (×2): 15 mL via OROMUCOSAL
  Filled 2015-07-03 (×4): qty 15

## 2015-07-03 NOTE — Telephone Encounter (Signed)
Wife called to inform us that pt is in ER for evaluation of HA. He went to ER yesterday too. He had fever of 104 but it is now down.

## 2015-07-03 NOTE — Progress Notes (Signed)
Jake [name]   DOB:05-04-1956   CB#:[id number]   OHY#:[id number]  Subjective: Pt is well known to me, and has been under my care for his GBM. His currently off treatment due to disease progression, and is waiting for clinical trial at [facility]. He presented with worsening headaches since last week, and fever today. He was admitted for further management.   Objective:  Filed Vitals:   07/03/15 1705  BP: 148/78  Pulse: 78  Temp: 99.3 F (37.4 C)  Resp: 16    Body mass index is 33.77 kg/(m^2).  Intake/Output Summary (Last 24 hours) at 07/03/15 2107 Last data filed at 07/03/15 1800  Gross per 24 hour  Intake    120 ml  Output      0 ml  Net    120 ml    Obese, drowsy appearance  Sclerae unicteric  Oropharynx clear  No peripheral adenopathy  Lungs clear -- no rales or rhonchi  Heart regular rate and rhythm  Abdomen benign  MSK no focal spinal tenderness, no peripheral edema  Neuro nonfocal except left vision deficit   CBG (last 3)  No results for input(s): GLUCAP in the last 72 hours.   Labs:  Lab Results  Component Value Date   WBC 10.0 07/03/2015   HGB 13.2 07/03/2015   HCT 41.1 07/03/2015   MCV 92.2 07/03/2015   PLT 194 07/03/2015   NEUTROABS 7.2 07/03/2015    @LASTCHEMISTRY @  Urine Studies No results for input(s): UHGB, CRYS in the last 72 hours.  Invalid input(s): UACOL, UAPR, USPG, UPH, UTP, UGL, UKET, UBIL, UNIT, UROB, ULEU, UEPI, UWBC, URBC, UBAC, CAST, UCOM, BILUA  Basic Metabolic Panel:  Recent Labs Lab 07/03/15 0700  NA 136  K 3.4*  CL 105  CO2 24  GLUCOSE 92  BUN 15  CREATININE 1.04  CALCIUM 9.0   GFR Estimated Creatinine Clearance: 95.2 mL/min (by C-G formula based on Cr of 1.04). Liver Function Tests:  Recent Labs Lab 07/03/15 0700  AST 24  ALT 21  ALKPHOS 52  BILITOT 0.6  PROT 6.3*  ALBUMIN 3.3*   No results for input(s): LIPASE, AMYLASE in the last 168 hours. No results for input(s): AMMONIA in the last 168 hours. Coagulation  profile No results for input(s): INR, PROTIME in the last 168 hours.  CBC:  Recent Labs Lab 07/03/15 0707  WBC 10.0  NEUTROABS 7.2  HGB 13.2  HCT 41.1  MCV 92.2  PLT 194   Cardiac Enzymes: No results for input(s): CKTOTAL, CKMB, CKMBINDEX, TROPONINI in the last 168 hours. BNP: Invalid input(s): POCBNP CBG: No results for input(s): GLUCAP in the last 168 hours. D-Dimer No results for input(s): DDIMER in the last 72 hours. Hgb A1c No results for input(s): HGBA1C in the last 72 hours. Lipid Profile No results for input(s): CHOL, HDL, LDLCALC, TRIG, CHOLHDL, LDLDIRECT in the last 72 hours. Thyroid function studies No results for input(s): TSH, T4TOTAL, T3FREE, THYROIDAB in the last 72 hours.  Invalid input(s): FREET3 Anemia work up No results for input(s): VITAMINB12, FOLATE, FERRITIN, TIBC, IRON, RETICCTPCT in the last 72 hours. Microbiology No results found for this or any previous visit (from the past 240 hour(s)).    Studies:  Dg Chest 2 View  07/03/2015  CLINICAL DATA:  Fever. EXAM: CHEST  2 VIEW COMPARISON:  12/25/2010 chest radiograph FINDINGS: Stable cardiomediastinal silhouette with normal heart size. No pneumothorax. No pleural effusion. Clear lungs, with no focal lung consolidation and no pulmonary edema. There is a  mild anterior compression deformity of a lower thoracic vertebral body, which is new since 07/02/2014. IMPRESSION: 1. No active cardiopulmonary disease. No focal lung consolidation to suggest a pneumonia. 2. Mild anterior compression deformity of a lower thoracic vertebral body, new since 07/02/2014. Electronically Signed   By: Ilona Sorrel M.D.   On: 07/03/2015 07:40   Ct Head Wo Contrast  07/02/2015  CLINICAL DATA:  Headaches x several weeks, known brain tumor EXAM: CT HEAD WITHOUT CONTRAST TECHNIQUE: Contiguous axial images were obtained from the base of the skull through the vertex without intravenous contrast. COMPARISON:  MRI brain dated  06/02/2015 FINDINGS: 8.3 x 5.0 cm hypodense mass along the posterior right temporal and right occipital lobes (series 201/ image 14), grossly unchanged from prior MRI. Associated mass effect. Low-density extends into the subcortical right parietal lobe and possibly the posterior frontal lobe (series 201/ image 22), progressed from recent MRI. Mass effect on the temporal horn of the right lateral ventricle. No hydrocephalus. No definite midline shift. No evidence of parenchymal hemorrhage or extra-axial fluid collection. No CT evidence of acute infarction. Cerebral volume is within normal limits.  No ventriculomegaly. The visualized paranasal sinuses are essentially clear. The mastoid air cells are unopacified. No evidence of calvarial fracture. IMPRESSION: 8.3 x 5.0 cm hypodense mass along the posterior right temporal and right occipital lobes, grossly unchanged, but with progression of low density extending anteriorly into the right parietal and frontal lobes. This appearance is worrisome for infiltrating tumor progression. Stable mass effect on the right lateral ventricle. No hydrocephalus. No definite midline shift. Electronically Signed   By: Julian Hy M.D.   On: 07/02/2015 16:56   Mr [name] PP Contrast  07/03/2015  CLINICAL DATA:  59 year old male with headache, fever and diaphoresis. Glioblastoma. Patient reports he is being considered but has not yet started immunotherapy at [place] ( Dr. Lonia Skinner confirms to meet during telephone discussion of this study that he has not yet started treatment at [facility]). Subsequent encounter. EXAM: MRI HEAD WITHOUT AND WITH CONTRAST TECHNIQUE: Multiplanar, multiecho pulse sequences of the brain and surrounding structures were obtained without and with intravenous contrast. CONTRAST:  33mL MULTIHANCE GADOBENATE DIMEGLUMINE 529 MG/ML IV SOLN COMPARISON:  06/02/2015, and earlier FINDINGS: Further progressed and now widespread posterior right hemisphere masslike T2  hyperintensity. Increased mass effect on the right lateral ventricle atrium. New leftward midline shift of 3-4 mm. Confluent T2 and FLAIR hyperintensity now tracking into the superior right parietal lobe and more confluent in the area of the posterior limb of the right external capsule. Signal abnormality extends towards the splenium but does not cross midline. Petechial hemorrhage in the posterior right occipital lobe is unchanged and might be postoperative in nature. Following contrast patchy and nodular enhancement throughout the posterior right hemisphere continues and is more confluent throughout. The anterior and superior extent of the enhancement also appears mildly increased (series 13, image 8 today versus series 13, image 8 in September). Diffusion abnormality within the right occipital lobe is stable to mildly progressed. No ventriculomegaly. No acute intracranial hemorrhage identified. No restricted diffusion or evidence of acute infarction. basilar cisterns remain patent. Major intracranial vascular flow voids are stable. Stable left hemisphere gray and white matter signal. No dural thickening or hyper enhancement identified. Negative pituitary. Cervicomedullary junction is within normal limits. Negative visualized cervical spine. No acute intracranial hemorrhage identified. Visible internal auditory structures appear normal. Mild paranasal sinus mucosal thickening is increased. Negative orbit and scalp soft tissues. IMPRESSION: Progressed signal  abnormality and mass effect in the posterior right hemisphere since September. New mild leftward midline shift of 3-4 mm. Mildly progressed and more confluent associated abnormal enhancement. In light of no new or interval therapy since September, strongly favor this is true progression of disease. Study discussed by telephone with Dr. Lonia Skinner on 07/03/2015 at 12:11 . Electronically Signed   By: Genevie Ann M.D.   On: 07/03/2015 12:12    Assessment: 58  y.o.  1. Fever, rule out infection  2. GBM, recent disease progression through second line chemotherapy, currently off treatment, waiting for clinical trials at [facility]. 3. Worsening headaches, likely secondary to disease progression 4. History of hypertension and hypothyroidism 5. Fatigue and anorexia    Plan:  -I discussed his brain MRI from today, which showed further disease progression  -ED physician spoke with [facility] who recommended increase his dexa to 4mg  q12h (per pt's wife) -I tried to reach his neuro-oncologist Dr. Imagene Riches but unsuccessful, will try again tomorrow to update his condition. -He was scheduled to start a vaccine trial at the end of November, the trial has specific requirement for steroids usage  -I agree with ID work up. No clinical evidence of localized infection, however given his long-term use of steroids, infection is possible and I would favor broad antibiotics for now  -I will follow up as needed, please call me for questions.    Truitt Merle, MD 07/03/2015  9:07 PM  [phone]

## 2015-07-03 NOTE — ED Provider Notes (Signed)
CSN: [id number]     Arrival date & time 07/03/15  6073 History   First MD Initiated Contact with Patient 07/03/15 [phone]     Chief Complaint  Patient presents with  . Fever    brain cancer pt     (Consider location/radiation/quality/duration/timing/severity/associated sxs/prior Treatment) HPI Comments: 59 y.o. Male with history of Glioblastoma currently not on chemotherapy presents for fever.  The patient has been off chemotherapy for about 6 weeks in preparation for going into a treatment study at [facility].  He has also been weaning off of his steroids.  He recently over the last week or so has been experiencing severe headaches which is not completely unusual for the patient but they do seem to be worse than usual.  The patient was seen yesterday in the ER and had a CT that showed progression of disease.  Patient denies cough, shortness of breath, pain with urination, blood in urine, rash.  He denies neck pain or rigidity.  His fever started overnight and his wife took it this morning and thought she saw that the thermometer read 104.  Patient is a 59 y.o. male presenting with fever.  Fever Associated symptoms: chills, headaches and myalgias   Associated symptoms: no chest pain, no congestion, no cough, no diarrhea, no dysuria, no nausea, no rash, no rhinorrhea and no vomiting     Past Medical History  Diagnosis Date  . Thyroid disease   . Hypertension   . Allergy   . Arthritis   . Sleep apnea   . Kidney stone   . Glioblastoma multiforme of occipital lobe ([place])   . Nocturnal leg cramps 09/29/2014  . History of radiation therapy 08/03/14- 09/15/14    right occipito-parietal brain/46 Gy 23 fx, right brain boost/ 14 Gy in 7 fx   Past Surgical History  Procedure Laterality Date  . Knee arthroscopy w/ meniscal repair    . Treatment fistula anal    . Craniotomy N/A 07/07/2014    Procedure: CRANIOTOMY TUMOR EXCISION/CURVE;  Surgeon: Elaina Hoops, MD;  Location: [place] [facility];  Service:  Neurosurgery;  Laterality: N/A;   Family History  Problem Relation Age of Onset  . Hypertension Mother   . Diabetes Father   . Glaucoma Father   . Diabetes Brother   . Diabetes Brother   . Colon cancer Neg Hx    Social History  Substance Use Topics  . Smoking status: Current Every Day Smoker -- 1.00 packs/day for 45 years    Types: Cigarettes  . Smokeless tobacco: Never Used  . Alcohol Use: 0.0 oz/week    0 Standard drinks or equivalent per week     Comment: 2-3 beers per/day     Review of Systems  Constitutional: Positive for fever, chills and fatigue.  HENT: Negative for congestion, postnasal drip, rhinorrhea and sinus pressure.   Eyes: Negative for pain and redness.  Respiratory: Negative for cough, chest tightness and shortness of breath.   Cardiovascular: Negative for chest pain and palpitations.  Gastrointestinal: Negative for nausea, vomiting, abdominal pain and diarrhea.  Genitourinary: Negative for dysuria, urgency, hematuria and flank pain.  Musculoskeletal: Positive for myalgias. Negative for back pain, gait problem, neck pain and neck stiffness.  Skin: Negative for rash.  Neurological: Positive for headaches. Negative for dizziness, weakness and numbness.  Hematological: Does not bruise/bleed easily.      Allergies  Other  Home Medications   Prior to Admission medications   Medication Sig Start Date End Date Taking?  Authorizing Provider  amitriptyline (ELAVIL) 25 MG tablet Take 12.5 mg by mouth at bedtime. Increase to 1 tablet after 1 week 06/30/15  Yes Historical Provider, MD  chlorhexidine (PERIDEX) 0.12 % solution Rinse with 15 mls three daily for 30 seconds. Use after breakfast, lunch and at bedtime. Spit out excess. Do not swallow. 05/10/15  Yes Lenn Cal, DDS  clonazePAM (KLONOPIN) 1 MG tablet Take 1-2 tablets (1-2 mg total) by mouth at bedtime. Patient taking differently: Take 0.5-1 mg by mouth at bedtime as needed for anxiety.  03/03/15  Yes Shawnee Knapp, MD  diphenhydrAMINE (BENADRYL) 25 MG tablet Take 1 tablet (25 mg total) by mouth every 6 (six) hours. 07/02/15  Yes Okey Regal, PA-C  docusate sodium (COLACE) 100 MG capsule Take 100 mg by mouth at bedtime.    Yes Historical Provider, MD  HYDROcodone-acetaminophen (NORCO/VICODIN) 5-325 MG tablet Take 1 tablet by mouth every 6 (six) hours as needed for moderate pain. 06/16/15  Yes Truitt Merle, MD  hydrocortisone (CORTEF) 10 MG tablet Take 20 mg (2 tabs) in AM, 10 mg (1 tab) in PM 04/07/15  Yes Historical Provider, MD  ketorolac (TORADOL) 10 MG tablet Take 1 tablet (10 mg total) by mouth every 8 (eight) hours as needed. Patient taking differently: Take 10 mg by mouth every 8 (eight) hours as needed (For pain.).  07/02/15  Yes Okey Regal, PA-C  levothyroxine (SYNTHROID, LEVOTHROID) 50 MCG tablet Take 1 tablet (50 mcg total) by mouth daily before breakfast. 03/04/15  Yes Shawnee Knapp, MD  lisinopril (PRINIVIL,ZESTRIL) 40 MG tablet Take 1 tablet (40 mg total) by mouth daily. 03/04/15  Yes Shawnee Knapp, MD  loperamide (IMODIUM) 2 MG capsule Take 1 capsule (2 mg total) by mouth as needed for diarrhea or loose stools. 05/26/15  Yes Truitt Merle, MD  LORazepam (ATIVAN) 0.5 MG tablet take 1 to 2 tablets by mouth every 8 hours if needed for anxiety nausea or sleep 05/26/15  Yes Truitt Merle, MD  metoCLOPramide (REGLAN) 10 MG tablet Take 1 tablet (10 mg total) by mouth every 6 (six) hours. 07/02/15  Yes Jeffrey Hedges, PA-C  Multiple Vitamins-Minerals (MULTIVITAMIN WITH MINERALS) tablet Take 1 tablet by mouth daily.   Yes Historical Provider, MD  NEXIUM 24HR 20 MG capsule Take 20 mg by mouth daily.  10/21/14  Yes Historical Provider, MD  ondansetron (ZOFRAN) 8 MG tablet Take 1 tablet (8 mg total) by mouth every 8 (eight) hours as needed for nausea or vomiting. 07/23/14  Yes Truitt Merle, MD  Probiotic Product (PROBIOTIC DAILY PO) Take 1 capsule by mouth daily.    Yes Historical Provider, MD  senna (SENOKOT) 8.6 MG tablet  Take 1 tablet by mouth at bedtime.    Yes Historical Provider, MD  verapamil (COVERA HS) 180 MG (CO) 24 hr tablet Take 1 tablet (180 mg total) by mouth at bedtime. 03/04/15  Yes Shawnee Knapp, MD  acetaminophen (TYLENOL) 325 MG tablet Take 2 tablets (650 mg total) by mouth every 6 (six) hours as needed for mild pain (or Fever >/= 101). 07/04/15   Robbie Lis, MD  dexamethasone (DECADRON) 4 MG tablet Take 1 tablet (4 mg total) by mouth 2 (two) times daily. 07/04/15   Robbie Lis, MD   BP 124/70 mmHg  Pulse 61  Temp(Src) 98.1 F (36.7 C) (Oral)  Resp 16  Ht 5\' 11"  (1.803 m)  Wt 240 lb 12.8 oz (109.226 kg)  BMI 33.60 kg/m2  SpO2  98% Physical Exam  Constitutional: He is oriented to person, place, and time. He appears well-developed and well-nourished. No distress.  HENT:  Head: Normocephalic and atraumatic.  Right Ear: External ear normal.  Left Ear: External ear normal.  Mouth/Throat: Oropharynx is clear and moist. No oropharyngeal exudate.  Eyes: EOM are normal. Pupils are equal, round, and reactive to light.  Neck: Normal range of motion and full passive range of motion without pain. Neck supple. No spinous process tenderness and no muscular tenderness present. No rigidity. Normal range of motion present. No Brudzinski's sign noted.  Cardiovascular: Regular rhythm, normal heart sounds and intact distal pulses.  Tachycardia present.   No murmur heard. Pulmonary/Chest: Effort normal. No respiratory distress. He has no wheezes. He has no rales.  Abdominal: Soft. He exhibits no distension. There is no tenderness.  Musculoskeletal: He exhibits no edema.  Neurological: He is alert and oriented to person, place, and time. He has normal strength. No sensory deficit. He exhibits normal muscle tone.  Skin: Skin is warm and dry. No rash noted. He is not diaphoretic.  Vitals reviewed.   ED Course  Procedures (including critical care time) Labs Review Labs Reviewed  COMPREHENSIVE METABOLIC PANEL  - Abnormal; Notable for the following:    Potassium 3.4 (*)    Total Protein 6.3 (*)    Albumin 3.3 (*)    All other components within normal limits  CBC WITH DIFFERENTIAL/PLATELET - Abnormal; Notable for the following:    RDW 18.4 (*)    All other components within normal limits  COMPREHENSIVE METABOLIC PANEL - Abnormal; Notable for the following:    Glucose, Bld 125 (*)    Total Protein 6.2 (*)    Albumin 3.0 (*)    All other components within normal limits  CBC - Abnormal; Notable for the following:    RDW 17.7 (*)    All other components within normal limits  GLUCOSE, CAPILLARY - Abnormal; Notable for the following:    Glucose-Capillary 112 (*)    All other components within normal limits  CULTURE, BLOOD (ROUTINE X 2)  CULTURE, BLOOD (ROUTINE X 2)  URINE CULTURE  URINALYSIS, ROUTINE W REFLEX MICROSCOPIC (NOT AT [facility])  INFLUENZA PANEL BY PCR (TYPE A & B, H1N1)  TSH  I-STAT CG4 LACTIC ACID, ED  I-STAT CG4 LACTIC ACID, ED    Imaging Review Dg Chest 2 View  07/03/2015  CLINICAL DATA:  Fever. EXAM: CHEST  2 VIEW COMPARISON:  12/25/2010 chest radiograph FINDINGS: Stable cardiomediastinal silhouette with normal heart size. No pneumothorax. No pleural effusion. Clear lungs, with no focal lung consolidation and no pulmonary edema. There is a mild anterior compression deformity of a lower thoracic vertebral body, which is new since 07/02/2014. IMPRESSION: 1. No active cardiopulmonary disease. No focal lung consolidation to suggest a pneumonia. 2. Mild anterior compression deformity of a lower thoracic vertebral body, new since 07/02/2014. Electronically Signed   By: Ilona Sorrel M.D.   On: 07/03/2015 07:40   Mr [name] BP Contrast  07/03/2015  CLINICAL DATA:  59 year old male with headache, fever and diaphoresis. Glioblastoma. Patient reports he is being considered but has not yet started immunotherapy at [facility] ( Dr. Lonia Skinner confirms to meet during telephone discussion of this study  that he has not yet started treatment at [facility]). Subsequent encounter. EXAM: MRI HEAD WITHOUT AND WITH CONTRAST TECHNIQUE: Multiplanar, multiecho pulse sequences of the brain and surrounding structures were obtained without and with intravenous contrast. CONTRAST:  11mL MULTIHANCE  GADOBENATE DIMEGLUMINE 529 MG/ML IV SOLN COMPARISON:  06/02/2015, and earlier FINDINGS: Further progressed and now widespread posterior right hemisphere masslike T2 hyperintensity. Increased mass effect on the right lateral ventricle atrium. New leftward midline shift of 3-4 mm. Confluent T2 and FLAIR hyperintensity now tracking into the superior right parietal lobe and more confluent in the area of the posterior limb of the right external capsule. Signal abnormality extends towards the splenium but does not cross midline. Petechial hemorrhage in the posterior right occipital lobe is unchanged and might be postoperative in nature. Following contrast patchy and nodular enhancement throughout the posterior right hemisphere continues and is more confluent throughout. The anterior and superior extent of the enhancement also appears mildly increased (series 13, image 8 today versus series 13, image 8 in September). Diffusion abnormality within the right occipital lobe is stable to mildly progressed. No ventriculomegaly. No acute intracranial hemorrhage identified. No restricted diffusion or evidence of acute infarction. basilar cisterns remain patent. Major intracranial vascular flow voids are stable. Stable left hemisphere gray and white matter signal. No dural thickening or hyper enhancement identified. Negative pituitary. Cervicomedullary junction is within normal limits. Negative visualized cervical spine. No acute intracranial hemorrhage identified. Visible internal auditory structures appear normal. Mild paranasal sinus mucosal thickening is increased. Negative orbit and scalp soft tissues. IMPRESSION: Progressed signal abnormality and  mass effect in the posterior right hemisphere since September. New mild leftward midline shift of 3-4 mm. Mildly progressed and more confluent associated abnormal enhancement. In light of no new or interval therapy since September, strongly favor this is true progression of disease. Study discussed by telephone with Dr. Lonia Skinner on 07/03/2015 at 12:11 . Electronically Signed   By: Genevie Ann M.D.   On: 07/03/2015 12:12   I have personally reviewed and evaluated these images and lab results as part of my medical decision-making.   EKG Interpretation None      MDM  Patient was seen and evaluated at bedside.  Patient reporting intractable headache but no other symptoms to explain fever.  Neck supple.  CT head from previous visit reviewed.  Chest xray, laboratory studies, UA unremarkable.  Discussed case with Dr. Burr Medico who recommended MRI.  MRI completed and showed worsening of disease.  I contacted directly and spoke with the neurologist at [facility] on call for the tumor center where the patient is hoping to start in a clinical trial at the end of the month.  He did no recommend transfer of patient as he said there was nothing more treatment wise that they could offer the patient then what could be done at [facility] but said they were available for phone consultation at any time.  He recommended starting the patient on 4 mg of Decadron twice daily and then putting the patient on a fast taper as he felt likely with the disease progression there was likely swelling attributing at least partially to the headache.  He also recommended observation in the hospital for recurrent fever as he did not think it likely that the fever was secondary to the tumor but recommended holding on empiric antibiotics at this time.Dorothy Puffer did not appear to be secondary to meningitis and MRI without signs of encephalitis and patient high risk for bedside LP and after discussion with both oncology and [name] neuro both agreed that LP not  be completed at this time.  Discussed case with hospitalist who agreed with admission for observation.  Patient's headache would at times becontrolled with Dilaudid, benadryl,  compazine but required multiple dosings. Final diagnoses:  Intractable headache    1. Glioblastoma  2. Headache  3. Fever    Harvel Quale, MD 07/05/15 [phone]

## 2015-07-03 NOTE — H&P (Signed)
[facility] History and Physical  Jake [name]:[id number] DOB: [birth year] DOA: 07/03/2015  Referring physician: ER physician; Lonia Skinner PCP: Jake Haber, MD  Chief Complaint: headache   HPI:  59 y.o. male with past medical history of Glioblastoma Multiforme, currently not on chemotherapy but was supposed to be enrolled in clinical trial, history of hypertension, depression who presented to [facility] ED with intractable headaches for past week or so and worsening in past 24 hours. Patient reported headache was in front and radiated to the sides. Pain was 10/10 in intensity, intermittent, sharp and not relieved with analgesia at home. No associated blurry vision or vision loss. No changes in color. He had fever at home as high as 59 F (per pt wife she thought it was 104 but may have been different number). No lightheadedness or loss of consciousness. No abdominal pain, nausea or vomiting. No blood in stool or urine.  In ED, pt was hemodynamically stable. He had a fever of 101.4 F which has improved to 98.3 F. Abx not given since no obvious source of infection. CXR and UA did not show acute infection. MRI brain and CT head showed what seems to be progression of brain tumor with midline shift and mass effect. He was started on decadron 4 mg IV every 6 hours. He was admitted for observation and management of his headaches.   Assessment & Plan     Principal Problem:   Intractable headache / GBM (glioblastoma multiforme) (HCC) - Likely from progression of brain tumor and mass effect, mild leftward midline shift - Appreciate oncology recommendations - Will start decadron 4 mg IV every 6 hours - Continue pain management efforts - May need Rad oncology consult for input on management  Active Problems:   Fever, unspecified - No clear source of infection - UA unremarkable - CXR showed no active cardiopulmonary disease - Will see with oncology if plan is for abx. For now will continue  to monitor fever curve.    Hypothyroidism - Continue synthroid    Essential hypertension - Continue lisinopril and verapamil     Depression - Continue Elavil - Not depressed, stable    DVT prophylaxis:  - SCD's bilaterally   Radiological Exams on Admission: Dg Chest 2 View 07/03/2015 1. No active cardiopulmonary disease. No focal lung consolidation to suggest a pneumonia. 2. Mild anterior compression deformity of a lower thoracic vertebral body, new since 07/02/2014. Electronically Signed   By: Ilona Sorrel M.D.   On: 07/03/2015 07:40   Ct Head Wo Contrast 07/02/2015  8.3 x 5.0 cm hypodense mass along the posterior right temporal and right occipital lobes, grossly unchanged, but with progression of low density extending anteriorly into the right parietal and frontal lobes. This appearance is worrisome for infiltrating tumor progression. Stable mass effect on the right lateral ventricle. No hydrocephalus. No definite midline shift. Electronically Signed   By: Julian Hy M.D.   On: 07/02/2015 16:56   Mr [name] EU Contrast 07/03/2015  Progressed signal abnormality and mass effect in the posterior right hemisphere since September. New mild leftward midline shift of 3-4 mm. Mildly progressed and more confluent associated abnormal enhancement. In light of no new or interval therapy since September, strongly favor this is true progression of disease.    Code Status: Full Family Communication: Plan of care discussed with the patient  Disposition Plan: Admit for further evaluation  Jake Lenz, MD  [facility] Pager [phone]  Time spent in minutes: 75 minutes  Review  of Systems:  Constitutional: Negative for fever, chills and malaise/fatigue. Negative for diaphoresis.  HENT: Negative for hearing loss, ear pain, nosebleeds, congestion, sore throat, neck pain, tinnitus and ear discharge.   Eyes: Negative for blurred vision, double vision, photophobia, pain, discharge and  redness.  Respiratory: Negative for cough, hemoptysis, sputum production, shortness of breath, wheezing and stridor.   Cardiovascular: Negative for chest pain, palpitations, orthopnea, claudication and leg swelling.  Gastrointestinal: Negative for nausea, vomiting and abdominal pain. Negative for heartburn, constipation, blood in stool and melena.  Genitourinary: Negative for dysuria, urgency, frequency, hematuria and flank pain.  Musculoskeletal: Negative for myalgias, back pain, joint pain and falls.  Skin: Negative for itching and rash.  Neurological: positive for headache, negative for sensory loss, negative for weakness. No tremors. Endo/Heme/Allergies: Negative for environmental allergies and polydipsia. Does not bruise/bleed easily.  Psychiatric/Behavioral: Negative for suicidal ideas. The patient is not nervous/anxious.      Past Medical History  Diagnosis Date  . Thyroid disease   . Hypertension   . Allergy   . Arthritis   . Sleep apnea   . Kidney stone   . Glioblastoma multiforme of occipital lobe ([place])   . Nocturnal leg cramps 09/29/2014  . History of radiation therapy 08/03/14- 09/15/14    right occipito-parietal brain/46 Gy 23 fx, right brain boost/ 14 Gy in 7 fx   Past Surgical History  Procedure Laterality Date  . Knee arthroscopy w/ meniscal repair    . Treatment fistula anal    . Craniotomy N/A 07/07/2014    Procedure: CRANIOTOMY TUMOR EXCISION/CURVE;  Surgeon: Elaina Hoops, MD;  Location: [place] [facility];  Service: Neurosurgery;  Laterality: N/A;   Social History:  reports that he has been smoking Cigarettes.  He has a 45 pack-year smoking history. He has never used smokeless tobacco. He reports that he drinks alcohol. He reports that he does not use illicit drugs.  Allergies  Allergen Reactions  . Other Nausea And Vomiting    Steroids - cause cramping all through the body - Headaches    Family History:  Family History  Problem Relation Age of Onset  .  Hypertension Mother   . Diabetes Father   . Glaucoma Father   . Diabetes Brother   . Diabetes Brother   . Colon cancer Neg Hx      Prior to Admission medications   Medication Sig Start Date End Date Taking? Authorizing Provider  amitriptyline (ELAVIL) 25 MG tablet Take 12.5 mg by mouth at bedtime. Increase to 1 tablet after 1 week 06/30/15  Yes Historical Provider, MD  clonazePAM (KLONOPIN) 1 MG tablet Take 1-2 tablets (1-2 mg total) by mouth at bedtime. Patient taking differently: Take 0.5-1 mg by mouth at bedtime as needed for anxiety.  03/03/15  Yes Shawnee Knapp, MD  diphenhydrAMINE (BENADRYL) 25 MG tablet Take 1 tablet (25 mg total) by mouth every 6 (six) hours. 07/02/15  Yes Okey Regal, PA-C  docusate sodium (COLACE) 100 MG capsule Take 100 mg by mouth at bedtime.    Yes Historical Provider, MD  HYDROcodone-acetaminophen (NORCO/VICODIN) 5-325 MG tablet Take 1 tablet by mouth every 6 (six) hours as needed for moderate pain. 06/16/15  Yes Truitt Merle, MD  hydrocortisone (CORTEF) 10 MG tablet Take 20 mg (2 tabs) in AM, 10 mg (1 tab) in PM 04/07/15  Yes Historical Provider, MD  ketorolac (TORADOL) 10 MG tablet Take 1 tablet (10 mg total) by mouth every 8 (eight) hours as needed. Patient  taking differently: Take 10 mg by mouth every 8 (eight) hours as needed (For pain.).  07/02/15  Yes Okey Regal, PA-C  levothyroxine (SYNTHROID, LEVOTHROID) 50 MCG tablet Take 1 tablet (50 mcg total) by mouth daily before breakfast. 03/04/15  Yes Shawnee Knapp, MD  lisinopril (PRINIVIL,ZESTRIL) 40 MG tablet Take 1 tablet (40 mg total) by mouth daily. 03/04/15  Yes Shawnee Knapp, MD  loperamide (IMODIUM) 2 MG capsule Take 1 capsule (2 mg total) by mouth as needed for diarrhea or loose stools. 05/26/15  Yes Truitt Merle, MD  LORazepam (ATIVAN) 0.5 MG tablet take 1 to 2 tablets by mouth every 8 hours if needed for anxiety nausea or sleep 05/26/15  Yes Truitt Merle, MD  metoCLOPramide (REGLAN) 10 MG tablet Take 1 tablet (10 mg total)  by mouth every 6 (six) hours. 07/02/15  Yes Jeffrey Hedges, PA-C  Multiple Vitamins-Minerals (MULTIVITAMIN WITH MINERALS) tablet Take 1 tablet by mouth daily.   Yes Historical Provider, MD  NEXIUM 24HR 20 MG capsule Take 20 mg by mouth daily.  10/21/14  Yes Historical Provider, MD  ondansetron (ZOFRAN) 8 MG tablet Take 1 tablet (8 mg total) by mouth every 8 (eight) hours as needed for nausea or vomiting. 07/23/14  Yes Truitt Merle, MD  Probiotic Product (PROBIOTIC DAILY PO) Take 1 capsule by mouth daily.    Yes Historical Provider, MD  senna (SENOKOT) 8.6 MG tablet Take 1 tablet by mouth at bedtime.    Yes Historical Provider, MD  verapamil (COVERA HS) 180 MG (CO) 24 hr tablet Take 1 tablet (180 mg total) by mouth at bedtime. 03/04/15  Yes Shawnee Knapp, MD   Physical Exam: Filed Vitals:   07/03/15 1204 07/03/15 1425 07/03/15 1429 07/03/15 1557  BP: 146/84 146/73 146/73 155/106  Pulse: 63 77 77 85  Temp:  98.3 F (36.8 C) 99.3 F (37.4 C) 102.3 F (39.1 C)  TempSrc:  Oral Oral Oral  Resp: 17 16  17   Height:      Weight:      SpO2: 96% 98%  94%    Physical Exam  Constitutional: Appears well-developed and well-nourished. No distress.  HENT: Normocephalic. No tonsillar erythema or exudates Eyes: Conjunctivae are normal. No scleral icterus.  Neck: Normal ROM. Neck supple. No JVD. No tracheal deviation. No thyromegaly.  CVS: RRR, S1/S2 +, no murmurs, no gallops, no carotid bruit.  Pulmonary: Effort and breath sounds normal, no stridor, rhonchi, wheezes, rales.  Abdominal: Soft. BS +,  no distension, tenderness, rebound or guarding.  Musculoskeletal: Normal range of motion. No edema and no tenderness.  Lymphadenopathy: No lymphadenopathy noted, cervical, inguinal. Neuro: Alert. Normal reflexes, muscle tone coordination. No focal neurologic deficits. Skin: Skin is warm and dry. No rash noted.  No erythema. No pallor.  Psychiatric: Normal mood and affect. Behavior, judgment, thought content normal.    Labs on Admission:  Basic Metabolic Panel:  Recent Labs Lab 07/03/15 0700  NA 136  K 3.4*  CL 105  CO2 24  GLUCOSE 92  BUN 15  CREATININE 1.04  CALCIUM 9.0   Liver Function Tests:  Recent Labs Lab 07/03/15 0700  AST 24  ALT 21  ALKPHOS 52  BILITOT 0.6  PROT 6.3*  ALBUMIN 3.3*   No results for input(s): LIPASE, AMYLASE in the last 168 hours. No results for input(s): AMMONIA in the last 168 hours. CBC:  Recent Labs Lab 07/03/15 0707  WBC 10.0  NEUTROABS 7.2  HGB 13.2  HCT 41.1  MCV 92.2  PLT 194   Cardiac Enzymes: No results for input(s): CKTOTAL, CKMB, CKMBINDEX, TROPONINI in the last 168 hours. BNP: Invalid input(s): POCBNP CBG: No results for input(s): GLUCAP in the last 168 hours.  If 7PM-7AM, please contact night-coverage [email or web address] Password [email or web address] 07/03/2015, 4:29 PM

## 2015-07-03 NOTE — ED Notes (Signed)
Urine sent to lab, form attached.

## 2015-07-03 NOTE — ED Notes (Signed)
Pt from home via EMS. Pt was seen at [facility] yesterday for a headache. He woke up this morning with a fever and diaphoretic. Pt stated to EMS he is part of a medical trial at [facility] where they inject him with "bacteria to initiate an immune response".

## 2015-07-03 NOTE — ED Notes (Signed)
Bed: PT47 Expected date:  Expected time:  Means of arrival:  Comments: 59 yo M  Brain cancer, fever

## 2015-07-03 NOTE — ED Notes (Signed)
Pt gone to CT 

## 2015-07-03 NOTE — Progress Notes (Signed)
Reviewed admission with hospitalist

## 2015-07-03 NOTE — ED Notes (Signed)
Calling in report to 3W . Talked to sec. Calling back in 5 mins

## 2015-07-04 ENCOUNTER — Telehealth: Payer: Self-pay | Admitting: Hematology

## 2015-07-04 DIAGNOSIS — R51 Headache: Secondary | ICD-10-CM | POA: Diagnosis not present

## 2015-07-04 DIAGNOSIS — I10 Essential (primary) hypertension: Secondary | ICD-10-CM | POA: Diagnosis not present

## 2015-07-04 DIAGNOSIS — F329 Major depressive disorder, single episode, unspecified: Secondary | ICD-10-CM | POA: Diagnosis not present

## 2015-07-04 DIAGNOSIS — R509 Fever, unspecified: Secondary | ICD-10-CM | POA: Diagnosis not present

## 2015-07-04 LAB — GLUCOSE, CAPILLARY: GLUCOSE-CAPILLARY: 112 mg/dL — AB (ref 65–99)

## 2015-07-04 LAB — URINE CULTURE

## 2015-07-04 LAB — CBC
HCT: 40.9 % (ref 39.0–52.0)
HEMOGLOBIN: 13 g/dL (ref 13.0–17.0)
MCH: 29.2 pg (ref 26.0–34.0)
MCHC: 31.8 g/dL (ref 30.0–36.0)
MCV: 91.9 fL (ref 78.0–100.0)
Platelets: 181 10*3/uL (ref 150–400)
RBC: 4.45 MIL/uL (ref 4.22–5.81)
RDW: 17.7 % — ABNORMAL HIGH (ref 11.5–15.5)
WBC: 8.9 10*3/uL (ref 4.0–10.5)

## 2015-07-04 LAB — COMPREHENSIVE METABOLIC PANEL
ALBUMIN: 3 g/dL — AB (ref 3.5–5.0)
ALK PHOS: 47 U/L (ref 38–126)
ALT: 18 U/L (ref 17–63)
ANION GAP: 8 (ref 5–15)
AST: 21 U/L (ref 15–41)
BUN: 11 mg/dL (ref 6–20)
CALCIUM: 9.2 mg/dL (ref 8.9–10.3)
CHLORIDE: 104 mmol/L (ref 101–111)
CO2: 23 mmol/L (ref 22–32)
Creatinine, Ser: 0.77 mg/dL (ref 0.61–1.24)
GFR calc non Af Amer: >60 mL/min (ref >60)
GLUCOSE: 125 mg/dL — AB (ref 65–99)
Potassium: 4.2 mmol/L (ref 3.5–5.1)
SODIUM: 135 mmol/L (ref 135–145)
Total Bilirubin: 0.8 mg/dL (ref 0.3–1.2)
Total Protein: 6.2 g/dL — ABNORMAL LOW (ref 6.5–8.1)

## 2015-07-04 LAB — TSH: TSH: 0.54 u[IU]/mL (ref 0.350–4.500)

## 2015-07-04 MED ORDER — DEXAMETHASONE SODIUM PHOSPHATE 4 MG/ML IJ SOLN: 4.0000 mg | Freq: Two times a day (BID) | INTRAMUSCULAR | Status: DC

## 2015-07-04 MED ORDER — ACETAMINOPHEN 325 MG PO TABS: 650.0000 mg | ORAL_TABLET | Freq: Four times a day (QID) | ORAL | Status: AC | PRN

## 2015-07-04 MED ORDER — DEXAMETHASONE 4 MG PO TABS: 4.0000 mg | ORAL_TABLET | Freq: Two times a day (BID) | ORAL | Status: DC

## 2015-07-04 NOTE — Telephone Encounter (Signed)
Faxed MRI to Dr. Imagene Riches to [phone]

## 2015-07-04 NOTE — Discharge Summary (Signed)
Physician Discharge Summary  Jake [name] WUJ:[id number] DOB: 07/30/1956 DOA: 07/03/2015  PCP: [name], MD  Admit date: 07/03/2015 Discharge date: 07/04/2015  Recommendations for Outpatient Follow-up:  Take decadron 4 mg every 12 hours on discharge If you spike a fever please call cancer center and your oncology will recommend antibiotic for you to take. I spoke with his oncologist who recommended decadron as above and if pt spikes fever they will call in abx for this patient.  Please know that your oncologist will schedule an appointment for you to be seen in cancer center.  Discharge Diagnoses:  Principal Problem:   Intractable headache Active Problems:   GBM (glioblastoma multiforme) (HCC)   Hypothyroidism   Essential hypertension   Depression   Fever, unspecified    Discharge Condition: stable   Diet recommendation: as tolerated   History of present illness:  59 y.o. male with past medical history of Glioblastoma Multiforme, currently not on chemotherapy but was supposed to be enrolled in clinical trial, history of hypertension, depression who presented to [facility] ED with intractable headaches for past week or so and worsening in past 24 hours. Pt reported front and side headaches with mostly on the left, throbbing, 10/10 in intensity. He also had associated fever per his wife report (up to possibly 104 F). No blurry vision. No lightheadedness.  In ED, pt was hemodynamically stable. He had a fever of 101.4 F which has improved to 98.3 F. Abx not given since no obvious source of infection. CXR and UA did not show acute infection. MRI brain and CT head showed what seems to be progression of brain tumor with midline shift and mass effect. He was started on decadron 4 mg IV every 6 hours. He was admitted for observation and management of his headaches.   Hospital Course:  Assessment & Plan    Principal Problem:  Intractable headache / GBM (glioblastoma multiforme)  (HCC) - Likely from progression of brain tumor and mass effect, mild leftward midline shift - Appreciate oncology recommendations - We will continue decadron Q 12 hours on discharge - Appreciate oncology sch appt for this patient to monitor his clinical course outpt  Active Problems:  Fever, unspecified - No clear source of infection - UA unremarkable - CXR showed no active cardiopulmonary disease - Abx not given on this admission    Hypothyroidism - Continue synthroid   Essential hypertension - Continue lisinopril and verapamil    Depression - Continue Elavil - Not depressed, stable    DVT prophylaxis:  - SCD's bilaterally in hospital   Radiological Exams on Admission: Dg Chest 2 View 07/03/2015 1. No active cardiopulmonary disease. No focal lung consolidation to suggest a pneumonia. 2. Mild anterior compression deformity of a lower thoracic vertebral body, new since 07/02/2014. Electronically Signed By: Ilona Sorrel M.D. On: 07/03/2015 07:40   Ct Head Wo Contrast 07/02/2015 8.3 x 5.0 cm hypodense mass along the posterior right temporal and right occipital lobes, grossly unchanged, but with progression of low density extending anteriorly into the right parietal and frontal lobes. This appearance is worrisome for infiltrating tumor progression. Stable mass effect on the right lateral ventricle. No hydrocephalus. No definite midline shift. Electronically Signed By: Julian Hy M.D. On: 07/02/2015 16:56   Mr [name] NF Contrast 07/03/2015 Progressed signal abnormality and mass effect in the posterior right hemisphere since September. New mild leftward midline shift of 3-4 mm. Mildly progressed and more confluent associated abnormal enhancement. In light of no new or interval therapy  since September, strongly favor this is true progression of disease.     SignedLeisa Lenz, MD  [facility] 07/04/2015, 10:26 AM  Pager #: [phone]  Time spent  in minutes: less than 30 minutes   Discharge Exam: Filed Vitals:   07/04/15 0451  BP: 124/70  Pulse: 61  Temp: 98.1 F (36.7 C)  Resp: 16   Filed Vitals:   07/03/15 1700 07/03/15 1705 07/03/15 2208 07/04/15 0451  BP:  148/78 143/88 124/70  Pulse:  78 86 61  Temp: 99.1 F (37.3 C) 99.3 F (37.4 C) 98.3 F (36.8 C) 98.1 F (36.7 C)  TempSrc: Oral Oral Oral Oral  Resp:  16 16 16   Height:  5\' 11"  (1.803 m)    Weight:  109.77 kg (242 lb)  109.226 kg (240 lb 12.8 oz)  SpO2:   92% 98%    General: Pt is alert, follows commands appropriately, not in acute distress Cardiovascular: Regular rate and rhythm, S1/S2 + Respiratory: Clear to auscultation bilaterally, no wheezing, no crackles, no rhonchi Abdominal: Soft, non tender, non distended, bowel sounds +, no guarding Extremities: no edema, no cyanosis, pulses palpable bilaterally DP and PT Neuro: Grossly nonfocal  Discharge Instructions  Discharge Instructions    Call MD for:  persistant dizziness or light-headedness    Complete by:  As directed      Call MD for:  redness, tenderness, or signs of infection (pain, swelling, redness, odor or green/yellow discharge around incision site)    Complete by:  As directed      Call MD for:  severe uncontrolled pain    Complete by:  As directed      Diet - low sodium heart healthy    Complete by:  As directed      Discharge instructions    Complete by:  As directed   Take decadron 4 mg every 12 hours on discharge If you spike a fever please call cancer center and your oncology will recommend antibiotic for you to take Please know that your oncologist will schedule an appointment for you to be seen in cancer center.     Increase activity slowly    Complete by:  As directed             Medication List    TAKE these medications        acetaminophen 325 MG tablet  Commonly known as:  TYLENOL  Take 2 tablets (650 mg total) by mouth every 6 (six) hours as needed for mild pain (or  Fever >/= 101).     amitriptyline 25 MG tablet  Commonly known as:  ELAVIL  Take 12.5 mg by mouth at bedtime. Increase to 1 tablet after 1 week     chlorhexidine 0.12 % solution  Commonly known as:  PERIDEX  Rinse with 15 mls three daily for 30 seconds. Use after breakfast, lunch and at bedtime. Spit out excess. Do not swallow.     clonazePAM 1 MG tablet  Commonly known as:  KLONOPIN  Take 1-2 tablets (1-2 mg total) by mouth at bedtime.     dexamethasone 4 MG tablet  Commonly known as:  DECADRON  Take 1 tablet (4 mg total) by mouth 2 (two) times daily.     diphenhydrAMINE 25 MG tablet  Commonly known as:  BENADRYL  Take 1 tablet (25 mg total) by mouth every 6 (six) hours.     docusate sodium 100 MG capsule  Commonly known as:  COLACE  Take 100 mg by mouth at bedtime.     HYDROcodone-acetaminophen 5-325 MG tablet  Commonly known as:  NORCO/VICODIN  Take 1 tablet by mouth every 6 (six) hours as needed for moderate pain.     hydrocortisone 10 MG tablet  Commonly known as:  CORTEF  Take 20 mg (2 tabs) in AM, 10 mg (1 tab) in PM     ketorolac 10 MG tablet  Commonly known as:  TORADOL  Take 1 tablet (10 mg total) by mouth every 8 (eight) hours as needed.     levothyroxine 50 MCG tablet  Commonly known as:  SYNTHROID, LEVOTHROID  Take 1 tablet (50 mcg total) by mouth daily before breakfast.     lisinopril 40 MG tablet  Commonly known as:  PRINIVIL,ZESTRIL  Take 1 tablet (40 mg total) by mouth daily.     loperamide 2 MG capsule  Commonly known as:  IMODIUM  Take 1 capsule (2 mg total) by mouth as needed for diarrhea or loose stools.     LORazepam 0.5 MG tablet  Commonly known as:  ATIVAN  take 1 to 2 tablets by mouth every 8 hours if needed for anxiety nausea or sleep     metoCLOPramide 10 MG tablet  Commonly known as:  REGLAN  Take 1 tablet (10 mg total) by mouth every 6 (six) hours.     multivitamin with minerals tablet  Take 1 tablet by mouth daily.     NEXIUM  24HR 20 MG capsule  Generic drug:  esomeprazole  Take 20 mg by mouth daily.     ondansetron 8 MG tablet  Commonly known as:  ZOFRAN  Take 1 tablet (8 mg total) by mouth every 8 (eight) hours as needed for nausea or vomiting.     PROBIOTIC DAILY PO  Take 1 capsule by mouth daily.     senna 8.6 MG tablet  Commonly known as:  SENOKOT  Take 1 tablet by mouth at bedtime.     verapamil 180 MG (CO) 24 hr tablet  Commonly known as:  COVERA HS  Take 1 tablet (180 mg total) by mouth at bedtime.           Follow-up Information    Follow up with [name], MD. Schedule an appointment as soon as possible for a visit in 1 week.   Specialty:  Family Medicine   Why:  Follow up appt after recent hospitalization   Contact information:   [place] [address] [phone]        The results of significant diagnostics from this hospitalization (including imaging, microbiology, ancillary and laboratory) are listed below for reference.    Significant Diagnostic Studies: Dg Chest 2 View  07/03/2015  CLINICAL DATA:  Fever. EXAM: CHEST  2 VIEW COMPARISON:  12/25/2010 chest radiograph FINDINGS: Stable cardiomediastinal silhouette with normal heart size. No pneumothorax. No pleural effusion. Clear lungs, with no focal lung consolidation and no pulmonary edema. There is a mild anterior compression deformity of a lower thoracic vertebral body, which is new since 07/02/2014. IMPRESSION: 1. No active cardiopulmonary disease. No focal lung consolidation to suggest a pneumonia. 2. Mild anterior compression deformity of a lower thoracic vertebral body, new since 07/02/2014. Electronically Signed   By: Ilona Sorrel M.D.   On: 07/03/2015 07:40   Ct Head Wo Contrast  07/02/2015  CLINICAL DATA:  Headaches x several weeks, known brain tumor EXAM: CT HEAD WITHOUT CONTRAST TECHNIQUE: Contiguous axial images were obtained from the base  of the skull through the vertex without intravenous  contrast. COMPARISON:  MRI brain dated 06/02/2015 FINDINGS: 8.3 x 5.0 cm hypodense mass along the posterior right temporal and right occipital lobes (series 201/ image 14), grossly unchanged from prior MRI. Associated mass effect. Low-density extends into the subcortical right parietal lobe and possibly the posterior frontal lobe (series 201/ image 22), progressed from recent MRI. Mass effect on the temporal horn of the right lateral ventricle. No hydrocephalus. No definite midline shift. No evidence of parenchymal hemorrhage or extra-axial fluid collection. No CT evidence of acute infarction. Cerebral volume is within normal limits.  No ventriculomegaly. The visualized paranasal sinuses are essentially clear. The mastoid air cells are unopacified. No evidence of calvarial fracture. IMPRESSION: 8.3 x 5.0 cm hypodense mass along the posterior right temporal and right occipital lobes, grossly unchanged, but with progression of low density extending anteriorly into the right parietal and frontal lobes. This appearance is worrisome for infiltrating tumor progression. Stable mass effect on the right lateral ventricle. No hydrocephalus. No definite midline shift. Electronically Signed   By: Julian Hy M.D.   On: 07/02/2015 16:56   Mr [name] WU Contrast  07/03/2015  CLINICAL DATA:  59 year old male with headache, fever and diaphoresis. Glioblastoma. Patient reports he is being considered but has not yet started immunotherapy at [facility] ( Dr. Lonia Skinner confirms to meet during telephone discussion of this study that he has not yet started treatment at [facility]). Subsequent encounter. EXAM: MRI HEAD WITHOUT AND WITH CONTRAST TECHNIQUE: Multiplanar, multiecho pulse sequences of the brain and surrounding structures were obtained without and with intravenous contrast. CONTRAST:  57mL MULTIHANCE GADOBENATE DIMEGLUMINE 529 MG/ML IV SOLN COMPARISON:  06/02/2015, and earlier FINDINGS: Further progressed and now widespread  posterior right hemisphere masslike T2 hyperintensity. Increased mass effect on the right lateral ventricle atrium. New leftward midline shift of 3-4 mm. Confluent T2 and FLAIR hyperintensity now tracking into the superior right parietal lobe and more confluent in the area of the posterior limb of the right external capsule. Signal abnormality extends towards the splenium but does not cross midline. Petechial hemorrhage in the posterior right occipital lobe is unchanged and might be postoperative in nature. Following contrast patchy and nodular enhancement throughout the posterior right hemisphere continues and is more confluent throughout. The anterior and superior extent of the enhancement also appears mildly increased (series 13, image 8 today versus series 13, image 8 in September). Diffusion abnormality within the right occipital lobe is stable to mildly progressed. No ventriculomegaly. No acute intracranial hemorrhage identified. No restricted diffusion or evidence of acute infarction. basilar cisterns remain patent. Major intracranial vascular flow voids are stable. Stable left hemisphere gray and white matter signal. No dural thickening or hyper enhancement identified. Negative pituitary. Cervicomedullary junction is within normal limits. Negative visualized cervical spine. No acute intracranial hemorrhage identified. Visible internal auditory structures appear normal. Mild paranasal sinus mucosal thickening is increased. Negative orbit and scalp soft tissues. IMPRESSION: Progressed signal abnormality and mass effect in the posterior right hemisphere since September. New mild leftward midline shift of 3-4 mm. Mildly progressed and more confluent associated abnormal enhancement. In light of no new or interval therapy since September, strongly favor this is true progression of disease. Study discussed by telephone with Dr. Lonia Skinner on 07/03/2015 at 12:11 . Electronically Signed   By: Genevie Ann M.D.   On:  07/03/2015 12:12    Microbiology: Recent Results (from the past 240 hour(s))  Urine culture  Status: None (Preliminary result)   Collection Time: 07/03/15  8:22 AM  Result Value Ref Range Status   Specimen Description URINE, CLEAN CATCH  Final   Special Requests NONE  Final   Culture   Final    TOO YOUNG TO READ Performed at [facility]    Report Status PENDING  Incomplete     Labs: Basic Metabolic Panel:  Recent Labs Lab 07/03/15 0700 07/04/15 0411  NA 136 135  K 3.4* 4.2  CL 105 104  CO2 24 23  GLUCOSE 92 125*  BUN 15 11  CREATININE 1.04 0.77  CALCIUM 9.0 9.2   Liver Function Tests:  Recent Labs Lab 07/03/15 0700 07/04/15 0411  AST 24 21  ALT 21 18  ALKPHOS 52 47  BILITOT 0.6 0.8  PROT 6.3* 6.2*  ALBUMIN 3.3* 3.0*   No results for input(s): LIPASE, AMYLASE in the last 168 hours. No results for input(s): AMMONIA in the last 168 hours. CBC:  Recent Labs Lab 07/03/15 0707 07/04/15 0411  WBC 10.0 8.9  NEUTROABS 7.2  --   HGB 13.2 13.0  HCT 41.1 40.9  MCV 92.2 91.9  PLT 194 181   Cardiac Enzymes: No results for input(s): CKTOTAL, CKMB, CKMBINDEX, TROPONINI in the last 168 hours. BNP: BNP (last 3 results) No results for input(s): BNP in the last 8760 hours.  ProBNP (last 3 results) No results for input(s): PROBNP in the last 8760 hours.  CBG:  Recent Labs Lab 07/04/15 0733  GLUCAP 112*

## 2015-07-04 NOTE — Progress Notes (Signed)
Patient and wife verbalized understanding of discharge instructions. Patient is stable at discharge.  

## 2015-07-04 NOTE — Discharge Instructions (Signed)
Dexamethasone tablets What is this medicine? DEXAMETHASONE (dex a METH a sone) is a corticosteroid. It is commonly used to treat inflammation of the skin, joints, lungs, and other organs. Common conditions treated include asthma, allergies, and arthritis. It is also used for other conditions, such as blood disorders and diseases of the adrenal glands. This medicine may be used for other purposes; ask your health care provider or pharmacist if you have questions. What should I tell my health care provider before I take this medicine? They need to know if you have any of these conditions: -Cushing's syndrome -diabetes -glaucoma -heart problems or disease -high blood pressure -infection like herpes, measles, tuberculosis, or chickenpox -kidney disease -liver disease -mental problems -myasthenia gravis -osteoporosis -previous heart attack -seizures -stomach, ulcer or intestine disease including colitis and diverticulitis -thyroid problem -an unusual or allergic reaction to dexamethasone, corticosteroids, other medicines, lactose, foods, dyes, or preservatives -pregnant or trying to get pregnant -breast-feeding How should I use this medicine? Take this medicine by mouth with a drink of water. Follow the directions on the prescription label. Take it with food or milk to avoid stomach upset. If you are taking this medicine once a day, take it in the morning. Do not take more medicine than you are told to take. Do not suddenly stop taking your medicine because you may develop a severe reaction. Your doctor will tell you how much medicine to take. If your doctor wants you to stop the medicine, the dose may be slowly lowered over time to avoid any side effects. Talk to your pediatrician regarding the use of this medicine in children. Special care may be needed. Patients over 86 years old may have a stronger reaction and need a smaller dose. Overdosage: If you think you have taken too much of this  medicine contact a poison control center or emergency room at once. NOTE: This medicine is only for you. Do not share this medicine with others. What if I miss a dose? If you miss a dose, take it as soon as you can. If it is almost time for your next dose, talk to your doctor or health care professional. You may need to miss a dose or take an extra dose. Do not take double or extra doses without advice. What may interact with this medicine? Do not take this medicine with any of the following medications: -mifepristone, RU-486 -vaccines This medicine may also interact with the following medications: -amphotericin B -antibiotics like clarithromycin, erythromycin, and troleandomycin -aspirin and aspirin-like drugs -barbiturates like phenobarbital -carbamazepine -cholestyramine -cholinesterase inhibitors like donepezil, galantamine, rivastigmine, and tacrine -cyclosporine -digoxin -diuretics -ephedrine -male hormones, like estrogens or progestins and birth control pills -indinavir -isoniazid -ketoconazole -medicines for diabetes -medicines that improve muscle tone or strength for conditions like myasthenia gravis -NSAIDs, medicines for pain and inflammation, like ibuprofen or naproxen -phenytoin -rifampin -thalidomide -warfarin This list may not describe all possible interactions. Give your health care provider a list of all the medicines, herbs, non-prescription drugs, or dietary supplements you use. Also tell them if you smoke, drink alcohol, or use illegal drugs. Some items may interact with your medicine. What should I watch for while using this medicine? Visit your doctor or health care professional for regular checks on your progress. If you are taking this medicine over a prolonged period, carry an identification card with your name and address, the type and dose of your medicine, and your doctor's name and address. This medicine may increase your risk of getting an  watch for while using this medicine?  Visit your doctor or health care professional for regular checks on your progress. If you are taking this medicine over a prolonged period, carry an identification card with your name and address, the type and dose of your medicine, and your doctor's name and address.  This medicine may increase your risk of getting an infection.  Stay away from people who are sick. Tell your doctor or health care professional if you are around anyone with measles or chickenpox.  If you are going to have surgery, tell your doctor or health care professional that you have taken this medicine within the last twelve months.  Ask your doctor or health care professional about your diet. You may need to lower the amount of salt you eat.  The medicine can increase your blood sugar. If you are a diabetic check with your doctor if you need help adjusting the dose of your diabetic medicine.  What side effects may I notice from receiving this medicine?  Side effects that you should report to your doctor or health care professional as soon as possible:  -allergic reactions like skin rash, itching or hives, swelling of the face, lips, or tongue  -changes in vision  -fever, sore throat, sneezing, cough, or other signs of infection, wounds that will not heal  -increased thirst  -mental depression, mood swings, mistaken feelings of self importance or of being mistreated  -pain in hips, back, ribs, arms, shoulders, or legs  -redness, blistering, peeling or loosening of the skin, including inside the mouth  -trouble passing urine or change in the amount of urine  -swelling of feet or lower legs  -unusual bleeding or bruising  Side effects that usually do not require medical attention (report to your doctor or health care professional if they continue or are bothersome):  -headache  -nausea, vomiting  -skin problems, acne, thin and shiny skin  -weight gain  This list may not describe all possible side effects. Call your doctor for medical advice about side effects. You may report side effects to FDA at 1-800-FDA-1088.  Where should I keep my medicine?  Keep out of the reach of children.  Store at room temperature between 20 and 25 degrees C (68 and 77 degrees F). Protect from light. Throw away any unused medicine after the expiration date.  NOTE: This sheet is a summary. It may  not cover all possible information. If you have questions about this medicine, talk to your doctor, pharmacist, or health care provider.      2016, [employer]. (2007-12-10 14:02:13)

## 2015-07-07 ENCOUNTER — Other Ambulatory Visit (HOSPITAL_BASED_OUTPATIENT_CLINIC_OR_DEPARTMENT_OTHER): Payer: BLUE CROSS/BLUE SHIELD

## 2015-07-07 ENCOUNTER — Telehealth: Payer: Self-pay | Admitting: Hematology

## 2015-07-07 ENCOUNTER — Ambulatory Visit (HOSPITAL_BASED_OUTPATIENT_CLINIC_OR_DEPARTMENT_OTHER): Payer: BLUE CROSS/BLUE SHIELD | Admitting: Hematology

## 2015-07-07 ENCOUNTER — Ambulatory Visit: Payer: Self-pay

## 2015-07-07 ENCOUNTER — Encounter: Payer: Self-pay | Admitting: Hematology

## 2015-07-07 VITALS — BP 144/92 | HR 79 | Temp 98.4°F | Resp 18 | Ht 71.0 in | Wt 235.5 lb

## 2015-07-07 DIAGNOSIS — C719 Malignant neoplasm of brain, unspecified: Secondary | ICD-10-CM

## 2015-07-07 DIAGNOSIS — R630 Anorexia: Secondary | ICD-10-CM

## 2015-07-07 DIAGNOSIS — C714 Malignant neoplasm of occipital lobe: Secondary | ICD-10-CM

## 2015-07-07 DIAGNOSIS — C713 Malignant neoplasm of parietal lobe: Secondary | ICD-10-CM

## 2015-07-07 DIAGNOSIS — I10 Essential (primary) hypertension: Secondary | ICD-10-CM

## 2015-07-07 DIAGNOSIS — E039 Hypothyroidism, unspecified: Secondary | ICD-10-CM

## 2015-07-07 DIAGNOSIS — R110 Nausea: Secondary | ICD-10-CM

## 2015-07-07 DIAGNOSIS — R5383 Other fatigue: Secondary | ICD-10-CM | POA: Diagnosis not present

## 2015-07-07 LAB — CBC WITH DIFFERENTIAL/PLATELET
BASO%: 0.7 % (ref 0.0–2.0)
Basophils Absolute: 0.1 10*3/uL (ref 0.0–0.1)
EOS ABS: 0 10*3/uL (ref 0.0–0.5)
EOS%: 0 % (ref 0.0–7.0)
HCT: 43.4 % (ref 38.4–49.9)
HGB: 14.2 g/dL (ref 13.0–17.1)
LYMPH%: 14.8 % (ref 14.0–49.0)
MCH: 29.2 pg (ref 27.2–33.4)
MCHC: 32.6 g/dL (ref 32.0–36.0)
MCV: 89.4 fL (ref 79.3–98.0)
MONO#: 1 10*3/uL — AB (ref 0.1–0.9)
MONO%: 9.1 % (ref 0.0–14.0)
NEUT#: 8.3 10*3/uL — ABNORMAL HIGH (ref 1.5–6.5)
NEUT%: 75.4 % — AB (ref 39.0–75.0)
PLATELETS: 224 10*3/uL (ref 140–400)
RBC: 4.86 10*6/uL (ref 4.20–5.82)
RDW: 19.4 % — ABNORMAL HIGH (ref 11.0–14.6)
WBC: 11.1 10*3/uL — AB (ref 4.0–10.3)
lymph#: 1.6 10*3/uL (ref 0.9–3.3)

## 2015-07-07 LAB — COMPREHENSIVE METABOLIC PANEL (CC13)
ALBUMIN: 3.1 g/dL — AB (ref 3.5–5.0)
ALK PHOS: 62 U/L (ref 40–150)
ALT: 30 U/L (ref 0–55)
AST: 25 U/L (ref 5–34)
Anion Gap: 9 mEq/L (ref 3–11)
BUN: 14.3 mg/dL (ref 7.0–26.0)
CALCIUM: 9.8 mg/dL (ref 8.4–10.4)
CHLORIDE: 108 meq/L (ref 98–109)
CO2: 22 mEq/L (ref 22–29)
CREATININE: 0.8 mg/dL (ref 0.7–1.3)
EGFR: >90 mL/min/{1.73_m2} (ref >90)
GLUCOSE: 92 mg/dL (ref 70–140)
POTASSIUM: 4 meq/L (ref 3.5–5.1)
SODIUM: 138 meq/L (ref 136–145)
Total Bilirubin: 0.43 mg/dL (ref 0.20–1.20)
Total Protein: 6.1 g/dL — ABNORMAL LOW (ref 6.4–8.3)

## 2015-07-07 MED ORDER — NYSTATIN 100000 UNIT/ML MT SUSP: 5.0000 mL | Freq: Four times a day (QID) | OROMUCOSAL | Status: AC

## 2015-07-07 NOTE — Telephone Encounter (Signed)
per pof to sch pt appt-*cld 7 left a message of time & date of next appt

## 2015-07-07 NOTE — Progress Notes (Signed)
[place] FOLLOW UP NOTE  Patient Care Team: [name], MD as PCP - General (Family Medicine) Kary Kos, MD as Consulting Physician (Neurosurgery) Eppie Gibson, MD as Attending Physician (Radiation Oncology) Truitt Merle, MD as Consulting Physician (Hematology)     Glioblastoma of occipital lobe [facility]) (Resolved)   07/02/2014 Imaging Brain MRI: Two adjacent enhancing mass lesions in the right parietal lobe with central necrosis and extensive surrounding edema and mass effect.  7 mm midline shift to the left.  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   CT CAP(-)   07/07/2014 Surgery Subtotal right parietal tumors resection    08/03/2014 Concurrent Chemotherapy Concurrent radiation with Temodar 75 mg/m daily, completed on 09/15/2014.    09/29/2014 - 12/28/2014 Chemotherapy Adjuvant Temodar, 150-200mg /m2 for 5 days, every 28 day cycle, s/p 3 cycles, stopped due to disease progression.   10/14/2014 Imaging Two discrete foci of enhancement in the RIGHT occipital lobe surround the larger resection cavity, and are suspected to represent foci of residual tumor/GBM displaying interval growt   12/13/2014 Imaging Increased size/ enhancement of 2 lesions adjacent to the right occipital resection cavity, concerning for growth of residual tumor.   01/06/2015 -  Chemotherapy Bevacizumab 10mg /kg every 2 weeks   04/18/2015 Progression Brain MRI showed progressed masslike signal abnormality in the right occipital lobe, most compatible with interval to progression of disease.   04/28/2015 - 05/26/2015 Chemotherapy Irinotecan 1 25 mg/m, every 2 weeks, was added on to Avastin due to disease progression. Stopped due to disease progression.    07/03/2015 - 07/04/2015 Hospital Admission He was admitted for worsening headaches and fever. ID workup was negative. His dexamethasone was increased, and his headaches are much improved. He was discharged home Today.    GBM (glioblastoma multiforme)  ([place])   07/22/2014 Initial Diagnosis GBM (glioblastoma multiforme)    CURRENT THERAPY: 1. Bevacizumab 10 mg/kg every 2 weeks, started on 01/06/2015, stopped on 05/26/2015 due to disease progression  2. Irinotecan 125 mg/m, every 2 weeks, was added on 04/28/2015, stopped after 05/26/2015 due to disease progression  3. dexa 4mg  daily until 6/10, changed to 4mg  and 2mg  daily alternatively, then 2mg  daily from 7/1, then 2mg  daily 2 days on, 1 day off from 7/25, hydrocortisone was added onto her steroids tapering by [name] in early August. Currently on dexa 0.5mg  dialy, 2 days on, one day off, and hydrocortisone 30 mg daily. Dexa was increased to 4mg  q12h from 07/03/15 due to the progression and worsening headaches  CHIEF COMPLAINTS: follow up GBM   HISTORY OF INITIAL PRESENTATION;   He initially presented was headaches and sinus congestion in mid October 2015. He was seen by his primary care physician and was treated for sinus infection. He subsequently developed left sided visual deficit, left arm and leg weakness and unsteady gait. He was sent to emergency room by his primary care physician on 07/02/2014. CT  And MRI of brain reviewed to add adjacent enhancing mass lesions in the right parietal lobe with central necrosis and extensive surrounding edema and mass effect. He was admitted and underwent right craniotomy  For resection of the 2 brain masses by Dr. Lonell Grandchild. Postsurgical brain MRI revealed a 1.3 cm area of enhancement at the anterior margin of surgical resection cavity, likely surgical change. His case was reviewed in the tumor board and it was felt he had subtotal resection.  INTERVAL HISTROY: [place] returns for follow-up. He developed worsening severe headaches about 7-10 days ago, and  was admitted to [facility] for observation on 07/03/2015 for headache and fever. Infection disease workup was negative, repeat brain MRI showed disease progression. His headaches significantly improved  after dexamethasone dose escalation, and he were discharged home next day with dexamethasone 4 mg every 12 hours. He feels much better this week, mild headaches, increased at 3-4 out of 10, able to function well at home, no more fevers. His appetite improved also with high-dose steroids.  MEDICAL HISTORY:  Past Medical History  Diagnosis Date  . Thyroid disease   . Hypertension   . Allergy   . Arthritis   .    Marland Kitchen Kidney stone     SURGICAL HISTORY: Past Surgical History  Procedure Laterality Date  . Knee arthroscopy w/ meniscal repair    . Treatment fistula anal    . 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 Marital Status: Married    Spouse Name: N/A    Number of Children: 5 children, age of 28-21  . Years of Education: N/A   Occupational History  . 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    Social History Main Topics  . Smoking status: Current Every Day Smoker -- 1.00 packs/day for 45 years    Types: Cigarettes  . Smokeless tobacco: Never Used  . Alcohol Use: Yes     Comment: 2-3 beers per/day   . Drug Use: No  . Sexual Activity: Not on file     FAMILY HISTORY: Family History  Problem Relation Age of Onset  . Hypertension Mother   . Diabetes Father   . Glaucoma Father   . Diabetes Brother   . Diabetes Brother   . Colon cancer Neg Hx   Paternal ancle had prostate cancer. No other family history of malignancy.  ALLERGIES:  is allergic to other.   MEDICATIONS:  Current Outpatient Prescriptions  Medication Sig Dispense Refill  . acetaminophen (TYLENOL) 325 MG tablet Take 2 tablets (650 mg total) by mouth every 6 (six) hours as needed for mild pain (or Fever >/= 101). 30 tablet 0  . amitriptyline (ELAVIL) 25 MG tablet Take 12.5 mg by mouth at bedtime.  Increase to 1 tablet after 1 week    . chlorhexidine (PERIDEX) 0.12 % solution Rinse with 15 mls three daily for 30 seconds. Use after breakfast, lunch and at bedtime. Spit out excess. Do not swallow. 1440 mL prn  . clonazePAM (KLONOPIN) 1 MG tablet Take 1-2 tablets (1-2 mg total) by mouth at bedtime. (Patient taking differently: Take 0.5-1 mg by mouth at bedtime as needed for anxiety. ) 30 tablet 3  . dexamethasone (DECADRON) 4 MG tablet Take 1 tablet (4 mg total) by mouth 2 (two) times daily. 60 tablet 0  . diphenhydrAMINE (BENADRYL) 25 MG tablet Take 1 tablet (25 mg total) by mouth every 6 (six) hours. 20 tablet 0  . docusate sodium (COLACE) 100 MG capsule Take 100 mg by mouth at bedtime.     Marland Kitchen HYDROcodone-acetaminophen (NORCO/VICODIN) 5-325 MG tablet Take 1 tablet by mouth every 6 (six) hours as needed for moderate pain. 30 tablet 0  . hydrocortisone (CORTEF) 10 MG tablet Take 20 mg (2 tabs) in AM, 10 mg (1 tab) in PM    . ketorolac (TORADOL) 10 MG tablet  Take 1 tablet (10 mg total) by mouth every 8 (eight) hours as needed. (Patient taking differently: Take 10 mg by mouth every 8 (eight) hours as needed (For pain.). ) 10 tablet 0  . levothyroxine (SYNTHROID, LEVOTHROID) 50 MCG tablet Take 1 tablet (50 mcg total) by mouth daily before breakfast. 90 tablet 3  . lisinopril (PRINIVIL,ZESTRIL) 40 MG tablet Take 1 tablet (40 mg total) by mouth daily. 90 tablet 3  . loperamide (IMODIUM) 2 MG capsule Take 1 capsule (2 mg total) by mouth as needed for diarrhea or loose stools. 60 capsule 0  . LORazepam (ATIVAN) 0.5 MG tablet take 1 to 2 tablets by mouth every 8 hours if needed for anxiety nausea or sleep 90 tablet 0  . metoCLOPramide (REGLAN) 10 MG tablet Take 1 tablet (10 mg total) by mouth every 6 (six) hours. 10 tablet 0  . Multiple Vitamins-Minerals (MULTIVITAMIN WITH MINERALS) tablet Take 1 tablet by mouth daily.    Marland Kitchen NEXIUM 24HR 20 MG capsule Take 20 mg by mouth daily.   0  . nystatin (MYCOSTATIN)  100000 UNIT/ML suspension Take 5 mLs (500,000 Units total) by mouth 4 (four) times daily. 473 mL 1  . ondansetron (ZOFRAN) 8 MG tablet Take 1 tablet (8 mg total) by mouth every 8 (eight) hours as needed for nausea or vomiting. 30 tablet 4  . Probiotic Product (PROBIOTIC DAILY PO) Take 1 capsule by mouth daily.     Marland Kitchen senna (SENOKOT) 8.6 MG tablet Take 1 tablet by mouth at bedtime.     . verapamil (COVERA HS) 180 MG (CO) 24 hr tablet Take 1 tablet (180 mg total) by mouth at bedtime. 90 tablet 3   No current facility-administered medications for this visit.    REVIEW OF SYSTEMS:   Constitutional: Denies fevers, chills or abnormal night sweats Eyes: Denies blurriness of vision, double vision or watery eyes Ears, nose, mouth, throat, and face: Denies mucositis or sore throat Respiratory: Denies cough, dyspnea or wheezes Cardiovascular: Denies palpitation, chest discomfort or lower extremity swelling Gastrointestinal:  Denies nausea, heartburn or change in bowel habits Skin: Denies abnormal skin rashes Lymphatics: Denies new lymphadenopathy or easy bruising Neurological:positive for mild headaches, and left side vision deficits. His left-sided weakness has nearly resolved. Denies numbness, tingling or new weaknesses Behavioral/Psych: Mood is stable, no new changes  All other systems were reviewed with the patient and are negative.  PHYSICAL EXAMINATION: ECOG PERFORMANCE STATUS: 1 - Symptomatic but completely ambulatory  KPS: 80%  Filed Vitals:   07/07/15 1328  BP: 144/92  Pulse: 79  Temp: 98.4 F (36.9 C)  Resp: 18   Filed Weights   07/07/15 1328  Weight: 235 lb 8 oz (106.822 kg)    GENERAL:alert, no distress and comfortable SKIN: skin color, texture, turgor are normal, no rashes or significant lesions HEAD: right-sided posterior craniotomy surgical wound is healing well, no surrounding edema, skin redness or discharge EYES: normal, conjunctiva are pink and non-injected, sclera  clear OROPHARYNX:no exudate, no erythema and lips, buccal mucosa, and (+) black hairy tongue  NECK: supple, thyroid normal size, non-tender, without nodularity LYMPH:  no palpable lymphadenopathy in the cervical, axillary or inguinal LUNGS: clear to auscultation and percussion with normal breathing effort HEART: regular rate & rhythm and no murmurs and no lower extremity edema ABDOMEN:abdomen soft, non-tender and normal bowel sounds Musculoskeletal:no cyanosis of digits and no clubbing  PSYCH: alert & oriented x 3 with fluent speech NEURO: cranial nerve 2-12 are intact. Left side vision  deficits is present in the left upper and mid quadrant. Motor and sensitivity are normal and symmetric on all extremities. Speech is fluent, coordination is grossly intact. Ambulates independently and Lutricia Feil sign negative. EXT: Bilateral pitting edema in ankles, stable    LABORATORY DATA:  CBC Latest Ref Rng 07/07/2015 07/04/2015 07/03/2015  WBC 4.0 - 10.3 10e3/uL 11.1(H) 8.9 10.0  Hemoglobin 13.0 - 17.1 g/dL 14.2 13.0 13.2  Hematocrit 38.4 - 49.9 % 43.4 40.9 41.1  Platelets 140 - 400 10e3/uL 224 181 194    CMP Latest Ref Rng 07/07/2015 07/04/2015 07/03/2015  Glucose 70 - 140 mg/dl 92 125(H) 92  BUN 7.0 - 26.0 mg/dL 14.3 11 15   Creatinine 0.7 - 1.3 mg/dL 0.8 0.77 1.04  Sodium 136 - 145 mEq/L 138 135 136  Potassium 3.5 - 5.1 mEq/L 4.0 4.2 3.4(L)  Chloride 101 - 111 mmol/L - 104 105  CO2 22 - 29 mEq/L 22 23 24   Calcium 8.4 - 10.4 mg/dL 9.8 9.2 9.0  Total Protein 6.4 - 8.3 g/dL 6.1(L) 6.2(L) 6.3(L)  Total Bilirubin 0.20 - 1.20 mg/dL 0.43 0.8 0.6  Alkaline Phos 40 - 150 U/L 62 47 52  AST 5 - 34 U/L 25 21 24   ALT 0 - 55 U/L 30 18 21      Surgical path 07/07/2014  Diagnosis 1. Brain, biopsy - GLIOBLASTOMA MULTIFORME, WHO GRADE IV/IV. - SEE ONCOLOGY TABLE BELOW. 2. Brain, for tumor resection, higher right parietal - GLIOBLASTOMA MULTIFORME, WHO GRADE IV/IV. - SEE ONCOLOGY TABLE BELOW. 3. Brain, for  tumor resection, lower right occipital - GLIOBLASTOMA MULTIFORME, WHO GRADE IV/IV   Microscopic Comment 1. -3. ONCOLOGY TABLE - BRAIN AND SPINAL CORD 1. Procedure: Resection x2 2. Tumor site, including laterality: Right parietal and right occipital 3. Maximum tumor size (cm): At least 1.8 cm 4. Histologic type: Glioblastoma multiforme 5. Grade: WHO Grade IV/IV 6. Margins (if applicable): Can not be assessed 7. Ancillary studies: Can be performed upon clinician request.  RADIOGRAPHIC STUDIES: I have personally reviewed the radiological images as listed and agreed with the findings in the report.  Mr [name] Wo Contrast 06/02/2015 IMPRESSION: Further increased masslike T2 hyperintensity and irregular enhancement in the right occipital and posterior right temporal lobes since August again suggesting true progression of disease. Intracranial mass effect is stable.  ASSESSMENT & PLAN:  Mr. Jake [name] is a 59 year old gentleman with newly diagnosed right parietal GBM, status post subtotal resection.   1. GBM, s/p subtotal resection, and concurrent chemoRT completed on 09/15/2014, progressed on adjuvant Temodar.  -I reviewed his restaging brain MRI from 4.12.16,  previous 2 small foci of enhancement around the surgical site has significantly increased on the repeated ischemic. This was reviewed in our brain tumor board this morning, and we agreed this is tumor for progression. Unfortunately, due to the position of this 2 lesions, subtotal resection are unlikely and surgery is not offered -he is s/p 2 lines of chemo and avastin. -His recent restaging brain MRI from 06/02/2015 unfortunately showed further disease progression. This was reviewed with patient and his wife. I also discussed with [place] neuro oncologist Dr. Imagene Riches and she has put him on a clinical vaccin trial, likely start in mid or later Nov  -He developed worsening headaches with tapering steroids, now much improved with increased  dexamethasone dose. He is on dexa 4mg  twice a day now -I spoke with Dr. Imagene Riches early this week, and mailed his recent brian MRI to her. She will review and decide if  he is still eligible for the trail. He needs to tape down his steroids in order to be eligible for the trial. I encouraged him to call Dr. Imagene Riches next week about the steroids tapering. I'm concerned that he may not be able to tolerate the low dose of dexamethasone which the trial requires. -cont Nexium daily when he is on steroids, vitamin the vitamin D and calcium, and regular exercise.  2. Anorexia, fatigue, nausea -Improved since off chemo  -Follow up with dietitian. Use Zofran as needed.  3. HTN and hypothyroidism  -continue follow up with PCP -TSH normal on 03/03/2015  4. Dental issue - he will  follow-up with Dr. Enrique Sack    Plan: -Hold any chemo for now, start clinical trial at [facility] in late Nov, he will follow up with Dr. Imagene Riches for steroids management  -RTC in 3 weeks for follow up   All questions were answered. The patient knows to call the clinic with any problems, questions or concerns.  I spent 20 minutes counseling the patient face to face. The total time spent in the appointmen was 25 minutes and more than 50% was on counseling.     Truitt Merle, MD 07/07/2015 11:18 PM

## 2015-07-08 LAB — CULTURE, BLOOD (ROUTINE X 2)
CULTURE: NO GROWTH
CULTURE: NO GROWTH

## 2015-07-10 ENCOUNTER — Inpatient Hospital Stay: Payer: BLUE CROSS/BLUE SHIELD | Admitting: Family Medicine

## 2015-07-10 ENCOUNTER — Telehealth: Payer: Self-pay

## 2015-07-10 NOTE — Telephone Encounter (Signed)
Wife called stating pt is going to [name] for consultation on Friday and surgery is scheduled for Monday. He was accepted for trial and moved up on the list. She states [name] has not received the MRI results yet. S/w Vanda in x-ray the MRI was sent to Dr. Aldine Contes neurology at [facility] sent on Nov 3. Tracking number [id number].  Called [name] and LVM with explanation and asking if Dr Imagene Riches has received the MRI. The trial nurse is who called the family and told them that [facility] has not received MRI yet.

## 2015-07-11 ENCOUNTER — Ambulatory Visit (HOSPITAL_COMMUNITY): Payer: Self-pay | Admitting: Dentistry

## 2015-07-11 ENCOUNTER — Encounter (HOSPITAL_COMMUNITY): Payer: Self-pay | Admitting: Dentistry

## 2015-07-11 VITALS — BP 144/82 | HR 82

## 2015-07-11 DIAGNOSIS — Z923 Personal history of irradiation: Secondary | ICD-10-CM

## 2015-07-11 DIAGNOSIS — C719 Malignant neoplasm of brain, unspecified: Secondary | ICD-10-CM

## 2015-07-11 DIAGNOSIS — K08109 Complete loss of teeth, unspecified cause, unspecified class: Secondary | ICD-10-CM

## 2015-07-11 DIAGNOSIS — K0820 Unspecified atrophy of edentulous alveolar ridge: Secondary | ICD-10-CM

## 2015-07-11 DIAGNOSIS — R29898 Other symptoms and signs involving the musculoskeletal system: Secondary | ICD-10-CM

## 2015-07-11 DIAGNOSIS — M898X9 Other specified disorders of bone, unspecified site: Secondary | ICD-10-CM

## 2015-07-11 NOTE — Patient Instructions (Signed)
Plan/Recommendations: 1. Continue chlorhexidine rinses 3 times daily as prescribed. 2. Brush tongue 2-3 times a day. 3. Return to clinic in two months for reevaluation of healing. 4. Call if problems arise before then. 5. Discussion of findings with medical team and coordination of future medical and dental care as needed.    Lenn Cal, DDS

## 2015-07-11 NOTE — Progress Notes (Signed)
Limited oral examination   Date:    07/11/2015   Patient Name:   Jake [name] Date of Birth:   1956/04/22 Medical Record Number: [id number]  VITALS: BP 144/82 mmHg  Pulse 82  HPI: Jake [name] is a 59 year old male with history of glioblastoma multiforme. The patient underwent craniotomy and surgical resection of the brain tumor on 07/07/2014 with Dr. Saintclair Halsted. The patient then underwent brain radiation therapy from 08/03/2014 through 09/15/2014 with Dr. Isidore Moos. Patient has been undergoing chemotherapy with Dr.Feng and is currently having Avastin and Ironotecan chemotherapy administered on an every two-week basis.   The patient was examined dentally on 05/10/2015 and found to have a 12 x 3 mm area of exposed bone involving the mandibular left lingual alveolar ridge area #17 thru #18. A bony sequestrum was noted at that time. The bony sequestrum was able to be removed with a soft tissue pliers without complications and minimal heme. There was some persistent exposed bone still present. Patient was then placed on chlorhexidine rinses to use on a 3 times a day basis and a follow-up appointment was scheduled for follow up.  The patient had additional ostectomy performed on 05/25/15 due to persistent exposed bone.  Patient was again seen on 06/08/2015 for reevaluation of healing. Minimal exposed bone was noted but no partial ostectomy was performed. The patient now presents for reevaluation of healing and additional partial ostectomy as indicated.   Lab Results  Component Value Date   WBC 11.1* 07/07/2015   HGB 14.2 07/07/2015   HCT 43.4 07/07/2015   MCV 89.4 07/07/2015   PLT 224 07/07/2015    SUBJECTIVE: Patient indicates that the lower left discomfort is much better. The patient again denies being able to feel the exposed bone with his tongue by report. Patient is using the chlorhexidine rinses 3 times daily. Patient is brushing his tongue daily. The patient was recently accepted into a  clinical study at [facility]. Patient is scheduled to have surgery on Monday, 07/17/2015, at [facility] that will be followed by additional therapy per protocol.  OBJECTIVE: There is a persistent 2 x 4 mm area of exposed bone. There is no loose bony sequestrum noted.  There is no protruding bone coming from the exposed mandible. The soft tissues around the exposed mandible are not erythematous. The patient appears to be healing in by secondary intention.  ASSESSMENT: 1. History of glioblastoma multiforme with surgical resection on 07/07/2014. 2. Status post brain radiation therapy from 08/03/2014 through 09/15/2014 3. History of chemotherapy with Avastin and Ironotecan with Dr. Burr Medico. 4. Anticipated surgery and additional treatment per investigational protocol at [facility]. 5. The patient is edentulous 6. 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 Persistent mandibular left lingual exposed bone in the area #17 and 18 now measuring 2 x 4 mm 9. Status post removal of bony sequestrum 0n 05/10/15 and 05/25/15. 10. History of upper and lower complete dentures but the patient only wears the upper denture.  11. History of Avastin chemotherapy with the risk for bleeding, delayed healing, and drug-induced osteonecrosis of the jaw.  I discussed the risks, benefits, and complications of various treatment options with the patient in relationship to his medical and dental conditions, active chemotherapy, and risk for bleeding, delayed healing, and osteonecrosis of the jaw.  We discussed various treatment options to include no treatment versus partial ostectomy and bony reshaping of the mandibular left lingual alveolar bone. We also discussed continued utilization  of chlorhexidine 0.12% oral rinses 3 times a day to assist in disinfection of the oral cavity at this time. The patient currently wishes to defer any additional partial ostectomy at  this time. The patient will continue to use the chlorhexidine rinses.   Plan/Recommendations: 1. Continue chlorhexidine rinses 3 times daily as prescribed. 2. Brush tongue 2-3 times a day. 3. Return to clinic in two months for reevaluation of healing. 4. Call if problems arise before then. 5. Discussion of findings with medical team and coordination of future medical and dental care as needed.    Lenn Cal, DDS

## 2015-07-13 NOTE — Telephone Encounter (Signed)
Called to verify if they heard from [facility] about getting the MRI. Pt stated they received MRI results yesterday.

## 2015-07-20 ENCOUNTER — Encounter: Payer: Self-pay | Admitting: Hematology

## 2015-07-20 ENCOUNTER — Telehealth: Payer: Self-pay | Admitting: Hematology

## 2015-07-20 NOTE — Telephone Encounter (Signed)
pt cld to CX appt and stated will call to r/s @ a later date

## 2015-07-20 NOTE — Progress Notes (Signed)
This encounter was created in error - please disregard.

## 2015-07-21 ENCOUNTER — Ambulatory Visit: Payer: Self-pay

## 2015-08-01 ENCOUNTER — Other Ambulatory Visit: Payer: Self-pay | Admitting: Hematology

## 2015-08-04 ENCOUNTER — Ambulatory Visit: Payer: Self-pay

## 2015-08-15 ENCOUNTER — Telehealth: Payer: Self-pay

## 2015-08-15 NOTE — Telephone Encounter (Signed)
Pt called to let us know that he is getting surgical workup at [facility] on the 16th and planned for surgery on the 19th. Expected to stay at [facility] until Saturday 24th.

## 2015-08-17 ENCOUNTER — Encounter: Payer: Self-pay | Admitting: *Deleted

## 2015-08-17 NOTE — Progress Notes (Signed)
[place] Work  Clinical Social Work was contacted by patient inquiring about Brain Tumor Support group for this month. Clinical Social Worker contacted patient at home. CSW had to leave message with upcoming schedule for the new year. CSW left words of encouragement as well. CSW to continue to follow and assist as needed. Pt returned CSW call promptly and pt reports finances are a concern. CSW to research additional assistance options and refer as appropriate.   Clinical Social Work interventions: Supportive listening Resource assistance  Jake [name], [place] Worker [place]  [place] Phone: [phone] Fax: [phone]

## 2015-08-23 ENCOUNTER — Encounter: Payer: Self-pay | Admitting: *Deleted

## 2015-08-23 NOTE — Progress Notes (Signed)
[place] Work  Clinical Social Work was notified by [employer] that pt was approved for assistance with utilities and power bill and water bill were paid by them. CSW phoned pt's spouse to check in and update her on that info. Per wife, pt's surgery went well and he is doing well at [facility]. Family very appreciative and doing well.    Clinical Social Work interventions: Resource assistance update  Loren Racer, [place] Worker [place]  [place] Phone: [phone] Fax: [phone]

## 2015-09-01 ENCOUNTER — Non-Acute Institutional Stay (SKILLED_NURSING_FACILITY): Payer: BLUE CROSS/BLUE SHIELD | Admitting: Internal Medicine

## 2015-09-01 DIAGNOSIS — E038 Other specified hypothyroidism: Secondary | ICD-10-CM

## 2015-09-01 DIAGNOSIS — K219 Gastro-esophageal reflux disease without esophagitis: Secondary | ICD-10-CM

## 2015-09-01 DIAGNOSIS — B370 Candidal stomatitis: Secondary | ICD-10-CM

## 2015-09-01 DIAGNOSIS — M62838 Other muscle spasm: Secondary | ICD-10-CM

## 2015-09-01 DIAGNOSIS — R531 Weakness: Secondary | ICD-10-CM

## 2015-09-01 DIAGNOSIS — F17200 Nicotine dependence, unspecified, uncomplicated: Secondary | ICD-10-CM

## 2015-09-01 DIAGNOSIS — C719 Malignant neoplasm of brain, unspecified: Secondary | ICD-10-CM | POA: Diagnosis not present

## 2015-09-01 DIAGNOSIS — K5900 Constipation, unspecified: Secondary | ICD-10-CM | POA: Diagnosis not present

## 2015-09-01 DIAGNOSIS — R51 Headache: Secondary | ICD-10-CM

## 2015-09-01 DIAGNOSIS — R519 Headache, unspecified: Secondary | ICD-10-CM

## 2015-09-01 DIAGNOSIS — F411 Generalized anxiety disorder: Secondary | ICD-10-CM

## 2015-09-01 DIAGNOSIS — I10 Essential (primary) hypertension: Secondary | ICD-10-CM

## 2015-09-03 NOTE — Progress Notes (Signed)
Patient ID: Jake [name], male   DOB: 10-Apr-1956, 60 y.o.   MRN: [id number]     [facility]   PCP: [name], MD  Code Status: full code  Allergies  Allergen Reactions  . Other Nausea And Vomiting    Steroids - cause cramping all through the body - Headaches    Chief Complaint  Patient presents with  . New Admit To SNF     HPI:  60 y.o. patient is here for short term rehabilitation post hospital admission from 08/21/15-08/31/15 post MRI brain lab/ AIRO guided right tempora sterostatic brain biopsy with placement of two therapeutic catheters. The frozen pathology showed recurrent malignant glioma. Infusion of D2C7 vaccine was started on 08/22/15. Post infusion, his catheters were removed on 08/25/15. He is seen in his room today sitting on his wheelchair. He denies any concerns this visit. No new nursing concern.   Review of Systems:  Constitutional: Negative for fever, chills. Positive for generalized weakness HENT: Negative for headache, congestion, nasal discharge, difficulty swallowing.   Eyes: Negative for eye pain, blurred vision, double vision and discharge.  Respiratory: Negative for cough, shortness of breath and wheezing.   Cardiovascular: Negative for chest pain, palpitations, leg swelling.  Gastrointestinal: Negative for heartburn, nausea, vomiting, abdominal pain. Appetite is poor. Had bowel movement today Genitourinary: Negative for dysuria and flank pain.  Musculoskeletal: Negative for back pain, falls Skin: Negative for rash.  Neurological: Negative for dizziness Psychiatric/Behavioral: Negative for depression   Past Medical History  Diagnosis Date  . Thyroid disease   . Hypertension   . Allergy   . Arthritis   . Sleep apnea   . Kidney stone   . Glioblastoma multiforme of occipital lobe ([place])   . Nocturnal leg cramps 09/29/2014  . History of radiation therapy 08/03/14- 09/15/14    right occipito-parietal brain/46  Gy 23 fx, right brain boost/ 14 Gy in 7 fx   Past Surgical History  Procedure Laterality Date  . Knee arthroscopy w/ meniscal repair    . Treatment fistula anal    . Craniotomy N/A 07/07/2014    Procedure: CRANIOTOMY TUMOR EXCISION/CURVE;  Surgeon: Elaina Hoops, MD;  Location: [facility];  Service: Neurosurgery;  Laterality: N/A;   Social History:   reports that he has been smoking Cigarettes.  He has a 45 pack-year smoking history. He has never used smokeless tobacco. He reports that he drinks alcohol. He reports that he does not use illicit drugs.  Family History  Problem Relation Age of Onset  . Hypertension Mother   . Diabetes Father   . Glaucoma Father   . Diabetes Brother   . Diabetes Brother   . Colon cancer Neg Hx     Medications:   Medication List       This list is accurate as of: 09/01/15 11:59 PM.  Always use your most recent med list.               acetaminophen 325 MG tablet  Commonly known as:  TYLENOL  Take 2 tablets (650 mg total) by mouth every 6 (six) hours as needed for mild pain (or Fever >/= 101).     amitriptyline 25 MG tablet  Commonly known as:  ELAVIL  Take 12.5 mg by mouth at bedtime. Increase to 1 tablet after 1 week     butalbital-acetaminophen-caffeine 50-325-40-30 MG capsule  Commonly known as:  FIORICET WITH CODEINE  Take 1 capsule by mouth every 4 (four) hours  as needed for headache.     calcium carbonate 750 MG chewable tablet  Commonly known as:  TUMS EX  Chew 2 tablets by mouth 2 (two) times daily.     chlorhexidine 0.12 % solution  Commonly known as:  PERIDEX  Rinse with 15 mls three daily for 30 seconds. Use after breakfast, lunch and at bedtime. Spit out excess. Do not swallow.     cyclobenzaprine 10 MG tablet  Commonly known as:  FLEXERIL  Take 10 mg by mouth 3 (three) times daily as needed for muscle spasms.     dexamethasone 4 MG tablet  Commonly known as:  DECADRON  Take 1 tablet (4 mg total) by mouth 2 (two) times  daily.     docusate sodium 100 MG capsule  Commonly known as:  COLACE  Take 100 mg by mouth at bedtime.     LACTOBACILLUS PO  Take 1 capsule by mouth daily.     levothyroxine 50 MCG tablet  Commonly known as:  SYNTHROID, LEVOTHROID  Take 1 tablet (50 mcg total) by mouth daily before breakfast.     lisinopril 40 MG tablet  Commonly known as:  PRINIVIL,ZESTRIL  Take 1 tablet (40 mg total) by mouth daily.     LORazepam 0.5 MG tablet  Commonly known as:  ATIVAN  take 1 to 2 tablets by mouth every 8 hours if needed for ANXIETY, NAUSEA, OR SLEEP     multivitamin with minerals tablet  Take 1 tablet by mouth daily.     NEXIUM 24HR 20 MG capsule  Generic drug:  esomeprazole  Take 20 mg by mouth daily.     nicotine 14 mg/24hr patch  Commonly known as:  NICODERM CQ - dosed in mg/24 hours  Place 14 mg onto the skin daily.     nystatin 100000 UNIT/ML suspension  Commonly known as:  MYCOSTATIN  Take 5 mLs (500,000 Units total) by mouth 4 (four) times daily.     oxyCODONE 5 MG immediate release tablet  Commonly known as:  Oxy IR/ROXICODONE  Take 5-10 mg by mouth every 4 (four) hours as needed for severe pain.     senna 8.6 MG tablet  Commonly known as:  SENOKOT  Take 1 tablet by mouth at bedtime.     verapamil 180 MG (CO) 24 hr tablet  Commonly known as:  COVERA HS  Take 1 tablet (180 mg total) by mouth at bedtime.         Physical Exam: Filed Vitals:   09/01/15 1822  BP: 110/73  Pulse: 80  Temp: 98 F (36.7 C)  Resp: 16  SpO2: 95%    General- adult male, obese, in no acute distress Head- normocephalic, atraumatic, well healed craniotomy incision to right side Nose- normal nasal mucosa, no maxillary or frontal sinus tenderness, no nasal discharge Throat- moist mucus membrane Eyes- PERRLA, EOMI, no pallor, no icterus, no discharge, normal conjunctiva, normal sclera Neck- no cervical lymphadenopathy Cardiovascular- normal s1,s2, no murmurs, no leg  edema Respiratory- bilateral clear to auscultation, no wheeze, no rhonchi, no crackles, no use of accessory muscles Abdomen- bowel sounds present, soft, non tender Musculoskeletal- able to move all 4 extremities, generalized weakness, palpable distal pulses  Neurological- no focal deficit, alert and oriented to person, place and time Skin- warm and dry Psychiatry- normal mood and affect    Labs reviewed: Basic Metabolic Panel:  Recent Labs  07/03/15 0700 07/04/15 0411 07/07/15 1312  NA 136 135 138  K 3.4* 4.2  4.0  CL 105 104  --   CO2 24 23 22   GLUCOSE 92 125* 92  BUN 15 11 14.3  CREATININE 1.04 0.77 0.8  CALCIUM 9.0 9.2 9.8   Liver Function Tests:  Recent Labs  07/03/15 0700 07/04/15 0411 07/07/15 1312  AST 24 21 25   ALT 21 18 30   ALKPHOS 52 47 62  BILITOT 0.6 0.8 0.43  PROT 6.3* 6.2* 6.1*  ALBUMIN 3.3* 3.0* 3.1*   No results for input(s): LIPASE, AMYLASE in the last 8760 hours. No results for input(s): AMMONIA in the last 8760 hours. CBC:  Recent Labs  06/08/15 1147 07/03/15 0707 07/04/15 0411 07/07/15 1312  WBC 6.4 10.0 8.9 11.1*  NEUTROABS 4.1 7.2  --  8.3*  HGB 13.8 13.2 13.0 14.2  HCT 41.8 41.1 40.9 43.4  MCV 90.2 92.2 91.9 89.4  PLT 219 194 181 224   Cardiac Enzymes: No results for input(s): CKTOTAL, CKMB, CKMBINDEX, TROPONINI in the last 8760 hours. BNP: Invalid input(s): POCBNP CBG:  Recent Labs  07/04/15 0733  GLUCAP 112*    Radiological Exams: Dg Chest 2 View  07/03/2015  CLINICAL DATA:  Fever. EXAM: CHEST  2 VIEW COMPARISON:  12/25/2010 chest radiograph FINDINGS: Stable cardiomediastinal silhouette with normal heart size. No pneumothorax. No pleural effusion. Clear lungs, with no focal lung consolidation and no pulmonary edema. There is a mild anterior compression deformity of a lower thoracic vertebral body, which is new since 07/02/2014. IMPRESSION: 1. No active cardiopulmonary disease. No focal lung consolidation to suggest a  pneumonia. 2. Mild anterior compression deformity of a lower thoracic vertebral body, new since 07/02/2014. Electronically Signed   By: Ilona Sorrel M.D.   On: 07/03/2015 07:40   Mr [name] [id number] Contrast  07/03/2015  CLINICAL DATA:  60 year old male with headache, fever and diaphoresis. Glioblastoma. Patient reports he is being considered but has not yet started immunotherapy at [facility] ( Dr. Lonia Skinner confirms to meet during telephone discussion of this study that he has not yet started treatment at [facility]). Subsequent encounter. EXAM: MRI HEAD WITHOUT AND WITH CONTRAST TECHNIQUE: Multiplanar, multiecho pulse sequences of the brain and surrounding structures were obtained without and with intravenous contrast. CONTRAST:  61mL MULTIHANCE GADOBENATE DIMEGLUMINE 529 MG/ML IV SOLN COMPARISON:  06/02/2015, and earlier FINDINGS: Further progressed and now widespread posterior right hemisphere masslike T2 hyperintensity. Increased mass effect on the right lateral ventricle atrium. New leftward midline shift of 3-4 mm. Confluent T2 and FLAIR hyperintensity now tracking into the superior right parietal lobe and more confluent in the area of the posterior limb of the right external capsule. Signal abnormality extends towards the splenium but does not cross midline. Petechial hemorrhage in the posterior right occipital lobe is unchanged and might be postoperative in nature. Following contrast patchy and nodular enhancement throughout the posterior right hemisphere continues and is more confluent throughout. The anterior and superior extent of the enhancement also appears mildly increased (series 13, image 8 today versus series 13, image 8 in September). Diffusion abnormality within the right occipital lobe is stable to mildly progressed. No ventriculomegaly. No acute intracranial hemorrhage identified. No restricted diffusion or evidence of acute infarction. basilar cisterns remain patent. Major intracranial vascular flow  voids are stable. Stable left hemisphere gray and white matter signal. No dural thickening or hyper enhancement identified. Negative pituitary. Cervicomedullary junction is within normal limits. Negative visualized cervical spine. No acute intracranial hemorrhage identified. Visible internal auditory structures appear normal. Mild paranasal sinus mucosal thickening is  increased. Negative orbit and scalp soft tissues. IMPRESSION: Progressed signal abnormality and mass effect in the posterior right hemisphere since September. New mild leftward midline shift of 3-4 mm. Mildly progressed and more confluent associated abnormal enhancement. In light of no new or interval therapy since September, strongly favor this is true progression of disease. Study discussed by telephone with Dr. Lonia Skinner on 07/03/2015 at 12:11 . Electronically Signed   By: Genevie Ann M.D.   On: 07/03/2015 12:12     Assessment/Plan  Generalized weakness Will have him work with physical therapy and occupational therapy team to help with gait training and muscle strengthening exercises.fall precautions. Skin care. Encourage to be out of bed.   Glioblastoma multiforme Recurrent glioma. S/p intratumoral infusion of D2C7. Has f/u with neurosurgery. Denies headache at present. Continue decadron 2 mg bid. Continue amitriptyline 12.5 mg qhs. Continue oxycodone 5 mg 1-2 tab q4h prn for pain  Headache Currently headache free. Continue prn fioricet  HTN Stable. Continue lisinopril 40 mg daily, verapamil 180 mg daily, monitor BP reading. monitor cmp  Hypothyroidism Continue levothyroxine 50 mcg daily  Tobacco use Continue nicotine patch  Muscle spasm Continue flexeril 10 mg tid prn  GAD Continue lorazepam 0.5 mg 1 tab q8h prn and monitor  gerd Stable, continue nexium 20 mg daily and prn tums  Constipation Continue docusate 100 mg daily and senokot daily, hydration to be maintained  Oral thrush Continue nystatin and  chlorhexidine mouth wash, maintain oral hygiene   Goals of care: short term rehabilitation   Labs/tests ordered: cbc, cmp  Family/ staff Communication: reviewed care plan with patient and nursing supervisor    Blanchie Serve, MD  [facility] [phone] (Monday-Friday 8 am - 5 pm) [phone] (afterhours)

## 2015-09-06 ENCOUNTER — Telehealth (HOSPITAL_COMMUNITY): Payer: Self-pay

## 2015-09-06 NOTE — Telephone Encounter (Signed)
09/06/2015   Patient's wife Jake [name] called to cancel F/U appt. w/Dr. Enrique Sack on 09/12/15 due to patient is recovering from surgery and is now in skilled nursing for rehab.  Patient's wife will call back at a later date to reschedule once patient is discharged.  LRI

## 2015-09-12 ENCOUNTER — Ambulatory Visit (HOSPITAL_COMMUNITY): Payer: Self-pay | Admitting: Dentistry

## 2015-09-19 ENCOUNTER — Emergency Department (HOSPITAL_COMMUNITY): Payer: BLUE CROSS/BLUE SHIELD

## 2015-09-19 ENCOUNTER — Inpatient Hospital Stay (HOSPITAL_COMMUNITY)
Admission: EM | Admit: 2015-09-19 | Discharge: 2015-09-22 | DRG: 054 | Disposition: A | Discharge status: Skilled Nursing Facility | Payer: BLUE CROSS/BLUE SHIELD | Attending: Family Medicine | Admitting: Family Medicine

## 2015-09-19 ENCOUNTER — Encounter (HOSPITAL_COMMUNITY): Payer: Self-pay | Admitting: Emergency Medicine

## 2015-09-19 DIAGNOSIS — I10 Essential (primary) hypertension: Secondary | ICD-10-CM | POA: Diagnosis present

## 2015-09-19 DIAGNOSIS — F17210 Nicotine dependence, cigarettes, uncomplicated: Secondary | ICD-10-CM | POA: Diagnosis present

## 2015-09-19 DIAGNOSIS — Z79899 Other long term (current) drug therapy: Secondary | ICD-10-CM | POA: Diagnosis not present

## 2015-09-19 DIAGNOSIS — Z7189 Other specified counseling: Secondary | ICD-10-CM | POA: Diagnosis not present

## 2015-09-19 DIAGNOSIS — E039 Hypothyroidism, unspecified: Secondary | ICD-10-CM | POA: Diagnosis present

## 2015-09-19 DIAGNOSIS — D649 Anemia, unspecified: Secondary | ICD-10-CM | POA: Diagnosis present

## 2015-09-19 DIAGNOSIS — R52 Pain, unspecified: Secondary | ICD-10-CM | POA: Diagnosis present

## 2015-09-19 DIAGNOSIS — R51 Headache: Secondary | ICD-10-CM

## 2015-09-19 DIAGNOSIS — R410 Disorientation, unspecified: Secondary | ICD-10-CM

## 2015-09-19 DIAGNOSIS — G4730 Sleep apnea, unspecified: Secondary | ICD-10-CM | POA: Diagnosis present

## 2015-09-19 DIAGNOSIS — Z515 Encounter for palliative care: Secondary | ICD-10-CM | POA: Diagnosis present

## 2015-09-19 DIAGNOSIS — R519 Headache, unspecified: Secondary | ICD-10-CM | POA: Diagnosis present

## 2015-09-19 DIAGNOSIS — G936 Cerebral edema: Secondary | ICD-10-CM | POA: Diagnosis present

## 2015-09-19 DIAGNOSIS — Z923 Personal history of irradiation: Secondary | ICD-10-CM | POA: Diagnosis not present

## 2015-09-19 DIAGNOSIS — G9340 Encephalopathy, unspecified: Secondary | ICD-10-CM | POA: Diagnosis present

## 2015-09-19 DIAGNOSIS — C719 Malignant neoplasm of brain, unspecified: Principal | ICD-10-CM | POA: Diagnosis present

## 2015-09-19 DIAGNOSIS — Z006 Encounter for examination for normal comparison and control in clinical research program: Secondary | ICD-10-CM

## 2015-09-19 LAB — BASIC METABOLIC PANEL
Anion gap: 9 (ref 5–15)
BUN: 33 mg/dL — AB (ref 6–20)
CHLORIDE: 114 mmol/L — AB (ref 101–111)
CO2: 20 mmol/L — ABNORMAL LOW (ref 22–32)
Calcium: 9.1 mg/dL (ref 8.9–10.3)
Creatinine, Ser: 0.85 mg/dL (ref 0.61–1.24)
GFR calc Af Amer: >60 mL/min (ref >60)
GFR calc non Af Amer: >60 mL/min (ref >60)
GLUCOSE: 114 mg/dL — AB (ref 65–99)
POTASSIUM: 3.7 mmol/L (ref 3.5–5.1)
Sodium: 143 mmol/L (ref 135–145)

## 2015-09-19 LAB — CBC WITH DIFFERENTIAL/PLATELET
Basophils Absolute: 0 10*3/uL (ref 0.0–0.1)
Basophils Relative: 0 %
EOS PCT: 0 %
Eosinophils Absolute: 0 10*3/uL (ref 0.0–0.7)
HCT: 32.7 % — ABNORMAL LOW (ref 39.0–52.0)
Hemoglobin: 10.8 g/dL — ABNORMAL LOW (ref 13.0–17.0)
LYMPHS ABS: 1 10*3/uL (ref 0.7–4.0)
LYMPHS PCT: 13 %
MCH: 29.3 pg (ref 26.0–34.0)
MCHC: 33 g/dL (ref 30.0–36.0)
MCV: 88.9 fL (ref 78.0–100.0)
MONO ABS: 0.4 10*3/uL (ref 0.1–1.0)
MONOS PCT: 5 %
Neutro Abs: 5.9 10*3/uL (ref 1.7–7.7)
Neutrophils Relative %: 82 %
PLATELETS: 159 10*3/uL (ref 150–400)
RBC: 3.68 MIL/uL — ABNORMAL LOW (ref 4.22–5.81)
RDW: 20 % — AB (ref 11.5–15.5)
WBC: 7.2 10*3/uL (ref 4.0–10.5)

## 2015-09-19 MED ORDER — MORPHINE SULFATE (PF) 4 MG/ML IV SOLN
4.0000 mg | Freq: Once | INTRAVENOUS | Status: AC
  Administered 2015-09-19: 4 mg via INTRAVENOUS
  Filled 2015-09-19: qty 1

## 2015-09-19 MED ORDER — DEXAMETHASONE SODIUM PHOSPHATE 10 MG/ML IJ SOLN
10.0000 mg | Freq: Once | INTRAMUSCULAR | Status: AC
  Administered 2015-09-19: 10 mg via INTRAVENOUS
  Filled 2015-09-19: qty 1

## 2015-09-19 NOTE — ED Notes (Signed)
Bed: [id number] Expected date:  Expected time:  Means of arrival:  Comments: EMS- headache x months/medication trial

## 2015-09-19 NOTE — ED Notes (Addendum)
Per EMS, patient is from [facility].  Patient has been having a headache since 09-19-15.  He has a history of brain tumor and is undergoing experimental treatment at [facility]. [name] told him to expect headache and brain swelling. He was advised to get a CT scan.   BP:  109/79 P:96 R:16 98% on room air

## 2015-09-19 NOTE — H&P (Signed)
[facility] History and Physical  Jake [name] [id number] DOB: 03/04/1956 DOA: 09/19/2015  Referring physician: Dr. Vallery Ridge. PCP: [name], MD  Specialists: Dr. Burr Medico. Oncologist.  Chief Complaint: Headache.  History obtained from patient's wife.  HPI: Jake [name] is a 60 y.o. male with history of recurrent glioblastoma multiforme who has recently received treatment at [facility] last month -  AIRO guided right tempora sterostatic brain biopsy with placement of two therapeutic catheters. The frozen pathology showed recurrent malignant glioma. Infusion of D2C7 vaccine was started on 08/22/15. Post infusion, his catheters were removed on 08/25/15 and eventually was admitted to rehabilitation was brought to the ER after patient was having severe headache requiring increasing pain medications. CT head shows worsening mass with midline shift and he had physician had discussed with [name] oncology and also Dr. Irene Limbo, oncologist on call at [facility]. At this time it is decided that patient will be admitted for symptomatic management with Decadron and pain relief medications. Patient's oncologist at [facility] will be seeing patient in consult. On my exam patient appears drowsy but follows commands.  Review of Systems: As presented in the history of presenting illness, rest negative.  Past Medical History  Diagnosis Date  . Thyroid disease   . Hypertension   . Allergy   . Arthritis   . Sleep apnea   . Kidney stone   . Glioblastoma multiforme of occipital lobe ([place])   . Nocturnal leg cramps 09/29/2014  . History of radiation therapy 08/03/14- 09/15/14    right occipito-parietal brain/46 Gy 23 fx, right brain boost/ 14 Gy in 7 fx   Past Surgical History  Procedure Laterality Date  . Knee arthroscopy w/ meniscal repair    . Treatment fistula anal    . Craniotomy N/A 07/07/2014    Procedure: CRANIOTOMY TUMOR EXCISION/CURVE;  Surgeon: Elaina Hoops, MD;  Location: [facility];   Service: Neurosurgery;  Laterality: N/A;  . Brain surgery     Social History:  reports that he has been smoking Cigarettes.  He has a 22.5 pack-year smoking history. He has never used smokeless tobacco. He reports that he drinks alcohol. He reports that he does not use illicit drugs. Where does patient live home. Presently in rehabilitation. Can patient participate in ADLs? No.  Allergies  Allergen Reactions  . Other Nausea And Vomiting    Steroids - cause cramping all through the body - Headaches    Family History:  Family History  Problem Relation Age of Onset  . Hypertension Mother   . Diabetes Father   . Glaucoma Father   . Diabetes Brother   . Diabetes Brother   . Colon cancer Neg Hx       Prior to Admission medications   Medication Sig Start Date End Date Taking? Authorizing Provider  amitriptyline (ELAVIL) 25 MG tablet Take 25 mg by mouth at bedtime.  06/30/15  Yes Historical Provider, MD  calcium carbonate (TUMS EX) 750 MG chewable tablet Chew 2 tablets by mouth 2 (two) times daily.   Yes Historical Provider, MD  chlorhexidine (PERIDEX) 0.12 % solution Rinse with 15 mls three daily for 30 seconds. Use after breakfast, lunch and at bedtime. Spit out excess. Do not swallow. 05/10/15  Yes Lenn Cal, DDS  cyclobenzaprine (FLEXERIL) 10 MG tablet Take 10 mg by mouth 3 (three) times daily as needed for muscle spasms.   Yes Historical Provider, MD  dexamethasone (DECADRON) 4 MG tablet Take 1 tablet (4 mg total) by mouth 2 (  two) times daily. Patient taking differently: Take 2 mg by mouth 2 (two) times daily.  07/04/15  Yes Robbie Lis, MD  docusate sodium (COLACE) 100 MG capsule Take 100 mg by mouth daily.    Yes Historical Provider, MD  HYDROcodone-acetaminophen (NORCO/VICODIN) 5-325 MG tablet Take 1 tablet by mouth every 4 (four) hours as needed for moderate pain.   Yes Historical Provider, MD  LACTOBACILLUS PO Take 1 capsule by mouth daily.   Yes Historical Provider, MD   levothyroxine (SYNTHROID, LEVOTHROID) 50 MCG tablet Take 1 tablet (50 mcg total) by mouth daily before breakfast. 03/04/15  Yes Shawnee Knapp, MD  lisinopril (PRINIVIL,ZESTRIL) 40 MG tablet Take 1 tablet (40 mg total) by mouth daily. 03/04/15  Yes Shawnee Knapp, MD  LORazepam (ATIVAN) 0.5 MG tablet take 1 to 2 tablets by mouth every 8 hours if needed for ANXIETY, NAUSEA, OR SLEEP Patient taking differently: take 1  tablet by mouth every 8 hours if needed for ANXIETY, NAUSEA, OR SLEEP 08/07/15  Yes Truitt Merle, MD  Multiple Vitamins-Minerals (MULTIVITAMIN WITH MINERALS) tablet Take 1 tablet by mouth daily.   Yes Historical Provider, MD  NEXIUM 24HR 20 MG capsule Take 20 mg by mouth daily.  10/21/14  Yes Historical Provider, MD  nystatin (MYCOSTATIN) 100000 UNIT/ML suspension Take 5 mLs (500,000 Units total) by mouth 4 (four) times daily. 07/07/15  Yes Truitt Merle, MD  senna (SENOKOT) 8.6 MG tablet Take 1 tablet by mouth at bedtime.    Yes Historical Provider, MD  verapamil (COVERA HS) 180 MG (CO) 24 hr tablet Take 1 tablet (180 mg total) by mouth at bedtime. 03/04/15  Yes Shawnee Knapp, MD  acetaminophen (TYLENOL) 325 MG tablet Take 2 tablets (650 mg total) by mouth every 6 (six) hours as needed for mild pain (or Fever >/= 101). Patient not taking: Reported on 09/19/2015 07/04/15   Robbie Lis, MD  butalbital-acetaminophen-caffeine (FIORICET WITH CODEINE) [id number] MG capsule Take 1 capsule by mouth every 4 (four) hours as needed for headache.    Historical Provider, MD  oxyCODONE (OXY IR/ROXICODONE) 5 MG immediate release tablet Take 5-10 mg by mouth every 4 (four) hours as needed for severe pain.    Historical Provider, MD    Physical Exam: Filed Vitals:   09/19/15 1835 09/19/15 2142 09/19/15 2246  BP: 104/64 112/88 122/97  Pulse: 75    Temp: 97.5 F (36.4 C)    TempSrc: Oral    Resp: 16 11 16   SpO2: 100% 95% 95%     General:  Moderately built and nourished.  Eyes: Anicteric no pallor.  ENT: No  discharge from the ears eyes nose and mouth.  Neck: No mass felt.  Cardiovascular: S1 and S2 heard.  Respiratory: No rhonchi or crepitations.  Abdomen: Soft nontender bowel sounds present.  Skin: No rash.  Musculoskeletal: No edema.  Psychiatric: Patient is drowsy.  Neurologic: Patient is drowsy.  Labs on Admission:  Basic Metabolic Panel:  Recent Labs Lab 09/19/15 2140  NA 143  K 3.7  CL 114*  CO2 20*  GLUCOSE 114*  BUN 33*  CREATININE 0.85  CALCIUM 9.1   Liver Function Tests: No results for input(s): AST, ALT, ALKPHOS, BILITOT, PROT, ALBUMIN in the last 168 hours. No results for input(s): LIPASE, AMYLASE in the last 168 hours. No results for input(s): AMMONIA in the last 168 hours. CBC:  Recent Labs Lab 09/19/15 2140  WBC 7.2  NEUTROABS 5.9  HGB 10.8*  HCT 32.7*  MCV 88.9  PLT 159   Cardiac Enzymes: No results for input(s): CKTOTAL, CKMB, CKMBINDEX, TROPONINI in the last 168 hours.  BNP (last 3 results) No results for input(s): BNP in the last 8760 hours.  ProBNP (last 3 results) No results for input(s): PROBNP in the last 8760 hours.  CBG: No results for input(s): GLUCAP in the last 168 hours.  Radiological Exams on Admission: Ct Head Wo Contrast  09/19/2015  CLINICAL DATA:  Glioblastoma.  Rule out acute hemorrhage. EXAM: CT HEAD WITHOUT CONTRAST TECHNIQUE: Contiguous axial images were obtained from the base of the skull through the vertex without intravenous contrast. COMPARISON:  MRI 07/03/2015.  CT 07/02/2015. FINDINGS: Large mass again noted in the posterior right cerebral hemisphere involving the right occipital lobe, parietal lobe, posterior temporal and frontal lobes. Areas of high density within the mass are unchanged and likely reflect calcifications. No evidence of hemorrhage. The overall mass has extended to now involve and cross the posterior corpus callosum. Increasing right to left midline shift, now all 9 mm. Mass-effect on the right  lateral ventricle. No hydrocephalus. IMPRESSION: Enlarging right posterior cerebral hemisphere mass, now crossing the posterior corpus callosum. No evidence for hemorrhage. Increasing right to left midline shift, now 9 mm. Electronically Signed   By: Rolm Baptise M.D.   On: 09/19/2015 18:56     Assessment/Plan Principal Problem:   Intractable headache Active Problems:   Hypothyroidism   Essential hypertension   GBM (glioblastoma multiforme) (HCC)   Intractable pain   1. Intractable headache with worsening brain mass with midline shift in a patient with known history of recurrent glioblastoma multiforme - ER physician has already [facility] and they have recommended admission to [facility] for symptomatic management and also on call oncologist at [facility] Dr. Irene Limbo also was notified and Dr. Irene Limbo as advised patient to be placed on Decadron IV every 6 hourly 6 mg after 10 mg bolus. Patient will be continued on pain medications. Oncologist will be seeing patient in consult. Palliative care consult also has been requested. 2. Acute encephalopathy - probably from pain and medications and #1. 3. Hypertension - continue home medications. 4. Hypothyroidism on Synthroid. 5. Anemia follow CBC. 6. Recent admission to [facility] for AIRO guided right tempora sterostatic brain biopsy with placement of two therapeutic catheters. The frozen pathology showed recurrent malignant glioma. Infusion of D2C7 vaccine was started on 08/22/15. Post infusion, his catheters were removed on 08/25/15.    DVT Prophylaxis SCDs.  Code Status: Full code.  Family Communication: Discussed with patient's wife.  Disposition Plan: Admit to inpatient.    Tobiah Celestine N. [facility] Pager [phone].  If 7PM-7AM, please contact night-coverage [email or web address] Password [email or web address] 09/19/2015, 11:19 PM

## 2015-09-19 NOTE — ED Provider Notes (Signed)
CSN: [id number]     Arrival date & time 09/19/15  1810 History   First MD Initiated Contact with Patient 09/19/15 1817     Chief Complaint  Patient presents with  . Headache     (Consider location/radiation/quality/duration/timing/severity/associated sxs/prior Treatment) HPI Patient has history of a glioblastoma. He is undergoing experimental therapy with viral targeted injections. Since the injection he has had increasing headache and requiring increased doses of pain medication. The headache is at the back of his head and radiates forward to his right eye. He already has left visual field loss which he reports is old. No acute visual changes. No vomiting. He has some pre-existing weakness in the left arm. There has not been an acute change and degree of weakness or motor dysfunction. No fever. The patient's wife reports that he has required every 4 hour dosing of Vicodin which was atypical for him despite his tumor. She reports is making him groggy and kind of loopy. Past Medical History  Diagnosis Date  . Thyroid disease   . Hypertension   . Allergy   . Arthritis   . Sleep apnea   . Kidney stone   . Glioblastoma multiforme of occipital lobe ([place])   . Nocturnal leg cramps 09/29/2014  . History of radiation therapy 08/03/14- 09/15/14    right occipito-parietal brain/46 Gy 23 fx, right brain boost/ 14 Gy in 7 fx   Past Surgical History  Procedure Laterality Date  . Knee arthroscopy w/ meniscal repair    . Treatment fistula anal    . Craniotomy N/A 07/07/2014    Procedure: CRANIOTOMY TUMOR EXCISION/CURVE;  Surgeon: Elaina Hoops, MD;  Location: [place] [facility];  Service: Neurosurgery;  Laterality: N/A;  . Brain surgery     Family History  Problem Relation Age of Onset  . Hypertension Mother   . Diabetes Father   . Glaucoma Father   . Diabetes Brother   . Diabetes Brother   . Colon cancer Neg Hx    Social History  Substance Use Topics  . Smoking status: Current Every Day Smoker --  0.50 packs/day for 45 years    Types: Cigarettes  . Smokeless tobacco: Never Used  . Alcohol Use: 0.0 oz/week    0 Standard drinks or equivalent per week     Comment: 2-3 beers per/day     Review of Systems 10 Systems reviewed and are negative for acute change except as noted in the HPI.    Allergies  Other  Home Medications   Prior to Admission medications   Medication Sig Start Date End Date Taking? Authorizing Provider  amitriptyline (ELAVIL) 25 MG tablet Take 25 mg by mouth at bedtime.  06/30/15  Yes Historical Provider, MD  calcium carbonate (TUMS EX) 750 MG chewable tablet Chew 2 tablets by mouth 2 (two) times daily.   Yes Historical Provider, MD  chlorhexidine (PERIDEX) 0.12 % solution Rinse with 15 mls three daily for 30 seconds. Use after breakfast, lunch and at bedtime. Spit out excess. Do not swallow. 05/10/15  Yes Lenn Cal, DDS  cyclobenzaprine (FLEXERIL) 10 MG tablet Take 10 mg by mouth 3 (three) times daily as needed for muscle spasms.   Yes Historical Provider, MD  dexamethasone (DECADRON) 4 MG tablet Take 1 tablet (4 mg total) by mouth 2 (two) times daily. Patient taking differently: Take 2 mg by mouth 2 (two) times daily.  07/04/15  Yes Robbie Lis, MD  docusate sodium (COLACE) 100 MG capsule Take 100  mg by mouth daily.    Yes Historical Provider, MD  HYDROcodone-acetaminophen (NORCO/VICODIN) 5-325 MG tablet Take 1 tablet by mouth every 4 (four) hours as needed for moderate pain.   Yes Historical Provider, MD  LACTOBACILLUS PO Take 1 capsule by mouth daily.   Yes Historical Provider, MD  levothyroxine (SYNTHROID, LEVOTHROID) 50 MCG tablet Take 1 tablet (50 mcg total) by mouth daily before breakfast. 03/04/15  Yes Shawnee Knapp, MD  lisinopril (PRINIVIL,ZESTRIL) 40 MG tablet Take 1 tablet (40 mg total) by mouth daily. 03/04/15  Yes Shawnee Knapp, MD  LORazepam (ATIVAN) 0.5 MG tablet take 1 to 2 tablets by mouth every 8 hours if needed for ANXIETY, NAUSEA, OR  SLEEP Patient taking differently: take 1  tablet by mouth every 8 hours if needed for ANXIETY, NAUSEA, OR SLEEP 08/07/15  Yes Truitt Merle, MD  Multiple Vitamins-Minerals (MULTIVITAMIN WITH MINERALS) tablet Take 1 tablet by mouth daily.   Yes Historical Provider, MD  NEXIUM 24HR 20 MG capsule Take 20 mg by mouth daily.  10/21/14  Yes Historical Provider, MD  nystatin (MYCOSTATIN) 100000 UNIT/ML suspension Take 5 mLs (500,000 Units total) by mouth 4 (four) times daily. 07/07/15  Yes Truitt Merle, MD  senna (SENOKOT) 8.6 MG tablet Take 1 tablet by mouth at bedtime.    Yes Historical Provider, MD  verapamil (COVERA HS) 180 MG (CO) 24 hr tablet Take 1 tablet (180 mg total) by mouth at bedtime. 03/04/15  Yes Shawnee Knapp, MD  acetaminophen (TYLENOL) 325 MG tablet Take 2 tablets (650 mg total) by mouth every 6 (six) hours as needed for mild pain (or Fever >/= 101). Patient not taking: Reported on 09/19/2015 07/04/15   Robbie Lis, MD  butalbital-acetaminophen-caffeine (FIORICET WITH CODEINE) [phone] MG capsule Take 1 capsule by mouth every 4 (four) hours as needed for headache.    Historical Provider, MD  oxyCODONE (OXY IR/ROXICODONE) 5 MG immediate release tablet Take 5-10 mg by mouth every 4 (four) hours as needed for severe pain.    Historical Provider, MD   BP 109/78 mmHg  Pulse 80  Temp(Src) 97.5 F (36.4 C) (Oral)  Resp 11  SpO2 95% Physical Exam  Constitutional:  Patient is awake but slightly drowsy. No respiratory distress. Color is good. He does have a moon facies and swelling suggestive of chronic steroid therapy.  HENT:  Head and face has significant appearance of steroid-induced swelling.  Eyes: EOM are normal. Pupils are equal, round, and reactive to light.  Cardiovascular: Normal rate, regular rhythm, normal heart sounds and intact distal pulses.   Pulmonary/Chest: Effort normal and breath sounds normal. No respiratory distress.  Abdominal: Soft. There is no tenderness.  Central abdominal  obesity.  Musculoskeletal:  Extensive muscular atrophy of extremities. 1+ edema lower extremities.  Neurological: He is alert.  Patient is alert and answering questions. He does intermittently show mild somnolence. He is situationally oriented. Patient follows commands for extremity motor testing. No significant localizing motor dysfunction.  Skin: Skin is warm and dry. There is pallor.    ED Course  Procedures (including critical care time) Labs Review Labs Reviewed  BASIC METABOLIC PANEL - Abnormal; Notable for the following:    Chloride 114 (*)    CO2 20 (*)    Glucose, Bld 114 (*)    BUN 33 (*)    All other components within normal limits  CBC WITH DIFFERENTIAL/PLATELET - Abnormal; Notable for the following:    RBC 3.68 (*)  Hemoglobin 10.8 (*)    HCT 32.7 (*)    RDW 20.0 (*)    All other components within normal limits    Imaging Review Ct Head Wo Contrast  09/19/2015  CLINICAL DATA:  Glioblastoma.  Rule out acute hemorrhage. EXAM: CT HEAD WITHOUT CONTRAST TECHNIQUE: Contiguous axial images were obtained from the base of the skull through the vertex without intravenous contrast. COMPARISON:  MRI 07/03/2015.  CT 07/02/2015. FINDINGS: Large mass again noted in the posterior right cerebral hemisphere involving the right occipital lobe, parietal lobe, posterior temporal and frontal lobes. Areas of high density within the mass are unchanged and likely reflect calcifications. No evidence of hemorrhage. The overall mass has extended to now involve and cross the posterior corpus callosum. Increasing right to left midline shift, now all 9 mm. Mass-effect on the right lateral ventricle. No hydrocephalus. IMPRESSION: Enlarging right posterior cerebral hemisphere mass, now crossing the posterior corpus callosum. No evidence for hemorrhage. Increasing right to left midline shift, now 9 mm. Electronically Signed   By: Rolm Baptise M.D.   On: 09/19/2015 18:56   I have personally reviewed and  evaluated these images and lab results as part of my medical decision-making.   EKG Interpretation None     Consult: Patient's case was reviewed Dr. Irene Limbo of oncology. At this time he advises after the administration of initial 10 mg of Decadron, 6 mg every 6 hours. Consult: Patient's case reviewed with the nurse practitioner Maudie Mercury) for [facility] managing team. At this time they advised to go ahead with the administration of Decadron and treat the patient symptomatically. They do not advise for transfer to [name] for ongoing participation in the trial.  Consult: Patient's case was reviewed with Dr. Earlean Polka for admission. MDM   Final diagnoses:  Glioblastoma ([place])  Intractable pain  Confusion   Patient presents with worsening headache and known glioblastoma. The CT head is ruled out acute bleed however patient does have midline shift and advanced disease. Consultation is made as outlined. The patient will be admitted for ongoing care.    Charlesetta Shanks, MD 09/19/15 [phone]

## 2015-09-20 DIAGNOSIS — I10 Essential (primary) hypertension: Secondary | ICD-10-CM

## 2015-09-20 DIAGNOSIS — C719 Malignant neoplasm of brain, unspecified: Principal | ICD-10-CM

## 2015-09-20 DIAGNOSIS — Z515 Encounter for palliative care: Secondary | ICD-10-CM

## 2015-09-20 DIAGNOSIS — R51 Headache: Secondary | ICD-10-CM

## 2015-09-20 DIAGNOSIS — R52 Pain, unspecified: Secondary | ICD-10-CM

## 2015-09-20 DIAGNOSIS — E039 Hypothyroidism, unspecified: Secondary | ICD-10-CM

## 2015-09-20 LAB — COMPREHENSIVE METABOLIC PANEL
ALBUMIN: 2.9 g/dL — AB (ref 3.5–5.0)
ALK PHOS: 77 U/L (ref 38–126)
ALT: 55 U/L (ref 17–63)
AST: 26 U/L (ref 15–41)
Anion gap: 10 (ref 5–15)
BILIRUBIN TOTAL: 0.8 mg/dL (ref 0.3–1.2)
BUN: 34 mg/dL — AB (ref 6–20)
CALCIUM: 8.6 mg/dL — AB (ref 8.9–10.3)
CO2: 19 mmol/L — AB (ref 22–32)
Chloride: 111 mmol/L (ref 101–111)
Creatinine, Ser: 0.74 mg/dL (ref 0.61–1.24)
GFR calc Af Amer: >60 mL/min (ref >60)
GFR calc non Af Amer: >60 mL/min (ref >60)
GLUCOSE: 115 mg/dL — AB (ref 65–99)
Potassium: 3.8 mmol/L (ref 3.5–5.1)
Sodium: 140 mmol/L (ref 135–145)
TOTAL PROTEIN: 5.2 g/dL — AB (ref 6.5–8.1)

## 2015-09-20 LAB — CBC WITH DIFFERENTIAL/PLATELET
BASOS ABS: 0 10*3/uL (ref 0.0–0.1)
BASOS PCT: 0 %
EOS PCT: 0 %
Eosinophils Absolute: 0 10*3/uL (ref 0.0–0.7)
HCT: 34.1 % — ABNORMAL LOW (ref 39.0–52.0)
Hemoglobin: 11 g/dL — ABNORMAL LOW (ref 13.0–17.0)
LYMPHS PCT: 14 %
Lymphs Abs: 1 10*3/uL (ref 0.7–4.0)
MCH: 28.9 pg (ref 26.0–34.0)
MCHC: 32.3 g/dL (ref 30.0–36.0)
MCV: 89.7 fL (ref 78.0–100.0)
MONO ABS: 0.3 10*3/uL (ref 0.1–1.0)
MONOS PCT: 4 %
Neutro Abs: 5.7 10*3/uL (ref 1.7–7.7)
Neutrophils Relative %: 82 %
PLATELETS: 164 10*3/uL (ref 150–400)
RBC: 3.8 MIL/uL — ABNORMAL LOW (ref 4.22–5.81)
RDW: 20 % — AB (ref 11.5–15.5)
WBC: 6.9 10*3/uL (ref 4.0–10.5)

## 2015-09-20 MED ORDER — PANTOPRAZOLE SODIUM 40 MG PO TBEC
40.0000 mg | DELAYED_RELEASE_TABLET | Freq: Every day | ORAL | Status: DC
  Administered 2015-09-20 – 2015-09-22 (×3): 40 mg via ORAL
  Filled 2015-09-20 (×3): qty 1

## 2015-09-20 MED ORDER — HYDRALAZINE HCL 20 MG/ML IJ SOLN: 10.0000 mg | INTRAMUSCULAR | Status: DC | PRN

## 2015-09-20 MED ORDER — ONDANSETRON HCL 4 MG PO TABS: 4.0000 mg | ORAL_TABLET | Freq: Four times a day (QID) | ORAL | Status: DC | PRN

## 2015-09-20 MED ORDER — SODIUM CHLORIDE 0.9 % IJ SOLN
3.0000 mL | Freq: Two times a day (BID) | INTRAMUSCULAR | Status: DC
  Administered 2015-09-20 – 2015-09-22 (×5): 3 mL via INTRAVENOUS

## 2015-09-20 MED ORDER — HYDROMORPHONE HCL 1 MG/ML IJ SOLN
0.5000 mg | INTRAMUSCULAR | Status: DC | PRN
  Administered 2015-09-20 – 2015-09-21 (×4): 0.5 mg via INTRAVENOUS
  Filled 2015-09-20 (×4): qty 1

## 2015-09-20 MED ORDER — SENNA 8.6 MG PO TABS
1.0000 | ORAL_TABLET | Freq: Every day | ORAL | Status: DC
  Administered 2015-09-20 – 2015-09-21 (×2): 8.6 mg via ORAL
  Filled 2015-09-20 (×2): qty 1

## 2015-09-20 MED ORDER — ONDANSETRON HCL 4 MG/2ML IJ SOLN: 4.0000 mg | Freq: Four times a day (QID) | INTRAMUSCULAR | Status: DC | PRN

## 2015-09-20 MED ORDER — ACETAMINOPHEN 650 MG RE SUPP: 650.0000 mg | Freq: Four times a day (QID) | RECTAL | Status: DC | PRN

## 2015-09-20 MED ORDER — AMITRIPTYLINE HCL 25 MG PO TABS
25.0000 mg | ORAL_TABLET | Freq: Every day | ORAL | Status: DC
  Administered 2015-09-20 – 2015-09-21 (×2): 25 mg via ORAL
  Filled 2015-09-20 (×2): qty 1

## 2015-09-20 MED ORDER — CHLORHEXIDINE GLUCONATE 0.12 % MT SOLN
15.0000 mL | Freq: Four times a day (QID) | OROMUCOSAL | Status: DC
  Administered 2015-09-20 – 2015-09-22 (×8): 15 mL via OROMUCOSAL
  Filled 2015-09-20 (×10): qty 15

## 2015-09-20 MED ORDER — ACETAMINOPHEN 325 MG PO TABS: 650.0000 mg | ORAL_TABLET | Freq: Four times a day (QID) | ORAL | Status: DC | PRN

## 2015-09-20 MED ORDER — LEVOTHYROXINE SODIUM 50 MCG PO TABS
50.0000 ug | ORAL_TABLET | Freq: Every day | ORAL | Status: DC
  Administered 2015-09-20 – 2015-09-22 (×3): 50 ug via ORAL
  Filled 2015-09-20 (×4): qty 1

## 2015-09-20 MED ORDER — LISINOPRIL 20 MG PO TABS
40.0000 mg | ORAL_TABLET | Freq: Every day | ORAL | Status: DC
  Administered 2015-09-20 – 2015-09-22 (×3): 40 mg via ORAL
  Filled 2015-09-20: qty 2
  Filled 2015-09-20: qty 1
  Filled 2015-09-20: qty 2
  Filled 2015-09-20: qty 1

## 2015-09-20 MED ORDER — DEXAMETHASONE SODIUM PHOSPHATE 4 MG/ML IJ SOLN
6.0000 mg | Freq: Four times a day (QID) | INTRAMUSCULAR | Status: DC
Start: 2015-09-20 — End: 2015-09-22
  Administered 2015-09-20 – 2015-09-22 (×9): 6 mg via INTRAVENOUS
  Filled 2015-09-20 (×9): qty 2

## 2015-09-20 MED ORDER — DOCUSATE SODIUM 100 MG PO CAPS
100.0000 mg | ORAL_CAPSULE | Freq: Every day | ORAL | Status: DC
  Administered 2015-09-20 – 2015-09-22 (×3): 100 mg via ORAL
  Filled 2015-09-20 (×3): qty 1

## 2015-09-20 MED ORDER — ADULT MULTIVITAMIN W/MINERALS CH
1.0000 | ORAL_TABLET | Freq: Every day | ORAL | Status: DC
  Administered 2015-09-20 – 2015-09-22 (×3): 1 via ORAL
  Filled 2015-09-20 (×3): qty 1

## 2015-09-20 MED ORDER — VERAPAMIL HCL ER 180 MG PO TBCR
180.0000 mg | EXTENDED_RELEASE_TABLET | Freq: Every day | ORAL | Status: DC
  Administered 2015-09-20 – 2015-09-21 (×2): 180 mg via ORAL
  Filled 2015-09-20 (×3): qty 1

## 2015-09-20 NOTE — Progress Notes (Signed)
I called [facility] to locate patient's dentures.  They called me back and reported that the dentures are at [facility], not with the patient.

## 2015-09-20 NOTE — Progress Notes (Signed)
[facility] PROGRESS NOTE  Jake [name] [id number] DOB: [birth year] DOA: 09/19/2015 PCP: [name], MD  Assessment/Plan: Principal Problem:   GBM (glioblastoma multiforme) ([place]) - Per EMR oncologist on board. Currently treating for symptomatic relief. Patient is currently on Decadron. We'll await recommendations from oncology regarding disposition - Obtain physical therapy evaluation  Intractable headache - Secondary to principal problem palliative care on board and assisting  Active Problems:   Hypothyroidism - Continue Synthroid. No symptoms associated with hypothyroidism reported    Essential hypertension - Pt on lisinopril and verapamil    Code Status: full Family Communication: No family at bedside Disposition Plan: Pending improvement in condition   Consultants:  None  Procedures:  None  Antibiotics:  None  HPI/Subjective: Pt has no new complaints. States that his headaches are much improved  Objective: Filed Vitals:   09/20/15 0911 09/20/15 1127  BP: 127/80 118/82  Pulse: 81 94  Temp: 98 F (36.7 C)   Resp: 20 17   No intake or output data in the 24 hours ending 09/20/15 1234 There were no vitals filed for this visit.  Exam:   General:  Pt in nad, alert and awake  Cardiovascular: rrr, no mrg  Respiratory: cta bl, no wheezes  Abdomen: soft, obese, nt  Musculoskeletal: no cyanosis or clubbing   Data Reviewed: Basic Metabolic Panel:  Recent Labs Lab 09/19/15 2140 09/20/15 0427  NA 143 140  K 3.7 3.8  CL 114* 111  CO2 20* 19*  GLUCOSE 114* 115*  BUN 33* 34*  CREATININE 0.85 0.74  CALCIUM 9.1 8.6*   Liver Function Tests:  Recent Labs Lab 09/20/15 0427  AST 26  ALT 55  ALKPHOS 77  BILITOT 0.8  PROT 5.2*  ALBUMIN 2.9*   No results for input(s): LIPASE, AMYLASE in the last 168 hours. No results for input(s): AMMONIA in the last 168 hours. CBC:  Recent Labs Lab 09/19/15 2140 09/20/15 0427  WBC 7.2 6.9   NEUTROABS 5.9 5.7  HGB 10.8* 11.0*  HCT 32.7* 34.1*  MCV 88.9 89.7  PLT 159 164   Cardiac Enzymes: No results for input(s): CKTOTAL, CKMB, CKMBINDEX, TROPONINI in the last 168 hours. BNP (last 3 results) No results for input(s): BNP in the last 8760 hours.  ProBNP (last 3 results) No results for input(s): PROBNP in the last 8760 hours.  CBG: No results for input(s): GLUCAP in the last 168 hours.  No results found for this or any previous visit (from the past 240 hour(s)).   Studies: Ct Head Wo Contrast  09/19/2015  CLINICAL DATA:  Glioblastoma.  Rule out acute hemorrhage. EXAM: CT HEAD WITHOUT CONTRAST TECHNIQUE: Contiguous axial images were obtained from the base of the skull through the vertex without intravenous contrast. COMPARISON:  MRI 07/03/2015.  CT 07/02/2015. FINDINGS: Large mass again noted in the posterior right cerebral hemisphere involving the right occipital lobe, parietal lobe, posterior temporal and frontal lobes. Areas of high density within the mass are unchanged and likely reflect calcifications. No evidence of hemorrhage. The overall mass has extended to now involve and cross the posterior corpus callosum. Increasing right to left midline shift, now all 9 mm. Mass-effect on the right lateral ventricle. No hydrocephalus. IMPRESSION: Enlarging right posterior cerebral hemisphere mass, now crossing the posterior corpus callosum. No evidence for hemorrhage. Increasing right to left midline shift, now 9 mm. Electronically Signed   By: Rolm Baptise M.D.   On: 09/19/2015 18:56    Scheduled Meds: . amitriptyline  25 mg  Oral QHS  . chlorhexidine  15 mL Mouth/Throat QID  . dexamethasone  6 mg Intravenous 4 times per day  . docusate sodium  100 mg Oral Daily  . levothyroxine  50 mcg Oral QAC breakfast  . lisinopril  40 mg Oral Daily  . multivitamin with minerals  1 tablet Oral Daily  . pantoprazole  40 mg Oral Daily  . senna  1 tablet Oral QHS  . sodium chloride  3 mL  Intravenous Q12H  . verapamil  180 mg Oral QHS   Continuous Infusions:   Time spent: > 35 minutes   Velvet Bathe  [facility] Pager [phone] If 7PM-7AM, please contact night-coverage at [email or web address], password [facility] 09/20/2015, 12:34 PM  LOS: 1 day

## 2015-09-20 NOTE — Progress Notes (Addendum)
CSW spoke with patient at bedside. Patient was pleasant and alert. Patient confirms he is a resident at [facility] and he likes being at this facility. Patient reports he has been at this facility for one month. Patient reports he has a headache as we spoke. Patient reports he has had several surgeries on his head.   Patient reports he has a wife, however, he could not recall her name at the time. Patient reports his wife is his main support and she is having a difficult time with him being sick. Patient reports his wife was attempting to come to the hospital, however, their car is not recognizing the key and she is at home.   Patient reports he utilizes a wheelchair. Patient reports he usually bathes and dresses himself, however, he will obtain help if needed. Patient reports he is retired. No questions noted for CSW at this time.  Genice Rouge [id number] ED CSW 09/20/2015 12:05 PM

## 2015-09-20 NOTE — ED Notes (Addendum)
Respirations not 9. Bad data validated.

## 2015-09-20 NOTE — Progress Notes (Signed)
Jake [name]   DOB:Jun 13, 1956   [id number]   [id number]  Subjective: Pt is well-know to me. He was admitted last night for worsening headaches, CT of her head shows disease progression.he is currently on a vaccine clinical trial at [facility] for his GBM, I last saw him on 07/07/2015.    Objective:  Filed Vitals:   09/20/15 1600 09/20/15 1719  BP: 136/122 136/85  Pulse:  88  Temp:    Resp: 20 17    There is no weight on file to calculate BMI. No intake or output data in the 24 hours ending 09/20/15 1742    Alert and oriented   Sclerae unicteric  Oropharynx clear  No peripheral adenopathy  Lungs clear -- no rales or rhonchi  Heart regular rate and rhythm  Abdomen benign  MSK no focal spinal tenderness, no peripheral edema    CBG (last 3)  No results for input(s): GLUCAP in the last 72 hours.   Labs:  Lab Results  Component Value Date   WBC 6.9 09/20/2015   HGB 11.0* 09/20/2015   HCT 34.1* 09/20/2015   MCV 89.7 09/20/2015   PLT 164 09/20/2015   NEUTROABS 5.7 09/20/2015    @LASTCHEMISTRY @  Urine Studies No results for input(s): UHGB, CRYS in the last 72 hours.  Invalid input(s): UACOL, UAPR, USPG, UPH, UTP, UGL, UKET, UBIL, UNIT, UROB, ULEU, UEPI, UWBC, URBC, UBAC, CAST, UCOM, BILUA  Basic Metabolic Panel:  Recent Labs Lab 09/19/15 2140 09/20/15 0427  NA 143 140  K 3.7 3.8  CL 114* 111  CO2 20* 19*  GLUCOSE 114* 115*  BUN 33* 34*  CREATININE 0.85 0.74  CALCIUM 9.1 8.6*   GFR CrCl cannot be calculated (Unknown ideal weight.). Liver Function Tests:  Recent Labs Lab 09/20/15 0427  AST 26  ALT 55  ALKPHOS 77  BILITOT 0.8  PROT 5.2*  ALBUMIN 2.9*   No results for input(s): LIPASE, AMYLASE in the last 168 hours. No results for input(s): AMMONIA in the last 168 hours. Coagulation profile No results for input(s): INR, PROTIME in the last 168 hours.  CBC:  Recent Labs Lab 09/19/15 2140 09/20/15 0427  WBC 7.2 6.9  NEUTROABS 5.9 5.7  HGB  10.8* 11.0*  HCT 32.7* 34.1*  MCV 88.9 89.7  PLT 159 164   Cardiac Enzymes: No results for input(s): CKTOTAL, CKMB, CKMBINDEX, TROPONINI in the last 168 hours. BNP: Invalid input(s): POCBNP CBG: No results for input(s): GLUCAP in the last 168 hours. D-Dimer No results for input(s): DDIMER in the last 72 hours. Hgb A1c No results for input(s): HGBA1C in the last 72 hours. Lipid Profile No results for input(s): CHOL, HDL, LDLCALC, TRIG, CHOLHDL, LDLDIRECT in the last 72 hours. Thyroid function studies No results for input(s): TSH, T4TOTAL, T3FREE, THYROIDAB in the last 72 hours.  Invalid input(s): FREET3 Anemia work up No results for input(s): VITAMINB12, FOLATE, FERRITIN, TIBC, IRON, RETICCTPCT in the last 72 hours. Microbiology No results found for this or any previous visit (from the past 240 hour(s)).    Studies:  Ct Head Wo Contrast  09/19/2015  CLINICAL DATA:  Glioblastoma.  Rule out acute hemorrhage. EXAM: CT HEAD WITHOUT CONTRAST TECHNIQUE: Contiguous axial images were obtained from the base of the skull through the vertex without intravenous contrast. COMPARISON:  MRI 07/03/2015.  CT 07/02/2015. FINDINGS: Large mass again noted in the posterior right cerebral hemisphere involving the right occipital lobe, parietal lobe, posterior temporal and frontal lobes. Areas of high density within the  mass are unchanged and likely reflect calcifications. No evidence of hemorrhage. The overall mass has extended to now involve and cross the posterior corpus callosum. Increasing right to left midline shift, now all 9 mm. Mass-effect on the right lateral ventricle. No hydrocephalus. IMPRESSION: Enlarging right posterior cerebral hemisphere mass, now crossing the posterior corpus callosum. No evidence for hemorrhage. Increasing right to left midline shift, now 9 mm. Electronically Signed   By: Rolm Baptise M.D.   On: 09/19/2015 18:56    Assessment: 60 y.o.  1. Recurrent GBM, currently on a  vaccine clinical trial at [facility] 2. Worsening headaches, probably related to his disease progression 3. Hypertension, hypothyroidism  Plan:  -I reviewed his CT scan image, and discussed with patient and his wife. His skin is consistent with disease progression, also pseudo-progression from immunoresponse is also a possibility -I have contacted his treating physician Dr. Imagene Riches at [facility], who would like to review his scan before make a final call about disease progression.  -Please obtain a brain MRI w wo contrast asap, and I will mail the copy to [place] -Will start him on Avastin in my clinic next week to reduce the edema in brain -agree with his dexa dose  -continue PT   I will follow up as need, please call me for questions.    Truitt Merle, MD 09/20/2015  5:42 PM

## 2015-09-20 NOTE — Clinical Social Work Note (Signed)
Clinical Social Work Assessment  Patient Details  Name: Jake [name] MRN: [id number] Date of Birth: 09/21/1955  Date of referral:  09/20/15               Reason for consult:  Other (Comment Required) (Patient from [facility])                Permission sought to share information with:    Permission granted to share information::     Name::        Agency::     Relationship::     Contact Information:     Housing/Transportation Living arrangements for the past 2 months:  [place] (Patient reports he has been at [facility] for one month) Source of Information:  Patient Patient Interpreter Needed:  None Criminal Activity/Legal Involvement Pertinent to Current Situation/Hospitalization:  No - Comment as needed Significant Relationships:  Other(Comment) (Patient reports he has a wife) Lives with:  Facility Resident ([place]) Do you feel safe going back to the place where you live?  Yes Need for family participation in patient care:     Care giving concerns: No family was present during this encounter to address concerns.    Social Worker assessment / plan: CSW spoke with patient at bedside with no family present during this encounter. Patient presents to [facility] due to a headache and he reports having several surgeries on his head. Patient confirms he is a resident at [facility] and per patient, he has been there for one month. Patient reports he normally completes his own ADL's, however, he obtains assistance when necessary. Patient reports he does have a wife, who is home at the time due to car issues. Patient reports he utilizes a wheelchair and he is retired.   Employment status:  Retired Forensic scientist:  Other (Comment Required) (Primary Insurance- [employer]) PT Recommendations:    Information / Referral to community resources:     Patient/Family's Response to care: Unknown at this time  Patient/Family's Understanding of and Emotional  Response to Diagnosis, Current Treatment, and Prognosis: Unknown at this time  Emotional Assessment Appearance:  Appears stated age Attitude/Demeanor/Rapport:    Affect (typically observed):  Accepting Orientation:  Oriented to Self, Oriented to Place, Oriented to  Time, Oriented to Situation (Per nurse note, patient is alert and oriented X4) Alcohol / Substance use:    Psych involvement (Current and /or in the community):  No (Comment)  Discharge Needs  Concerns to be addressed:    Readmission within the last 30 days:    Current discharge risk:    Barriers to Discharge:      Guadelupe Sabin, LCSW 09/20/2015, 12:13 PM

## 2015-09-20 NOTE — ED Notes (Signed)
Paged Dr. Wendee Beavers regarding pt's bed request.  Will inquire about possibility of changing from SD to either tele or medsurg.  Pt is A&O x 4 with stable vital signs.  No beds in the house at all.

## 2015-09-21 ENCOUNTER — Encounter: Payer: Self-pay | Admitting: *Deleted

## 2015-09-21 ENCOUNTER — Inpatient Hospital Stay (HOSPITAL_COMMUNITY): Payer: BLUE CROSS/BLUE SHIELD

## 2015-09-21 ENCOUNTER — Other Ambulatory Visit: Payer: Self-pay | Admitting: Hematology

## 2015-09-21 ENCOUNTER — Telehealth: Payer: Self-pay | Admitting: Hematology

## 2015-09-21 DIAGNOSIS — Z7189 Other specified counseling: Secondary | ICD-10-CM

## 2015-09-21 LAB — MRSA PCR SCREENING: MRSA BY PCR: NEGATIVE

## 2015-09-21 MED ORDER — SODIUM CHLORIDE 0.9 % IV BOLUS (SEPSIS)
500.0000 mL | Freq: Once | INTRAVENOUS | Status: AC
  Administered 2015-09-21: 500 mL via INTRAVENOUS

## 2015-09-21 MED ORDER — GADOBENATE DIMEGLUMINE 529 MG/ML IV SOLN
20.0000 mL | Freq: Once | INTRAVENOUS | Status: AC | PRN
  Administered 2015-09-21: 20 mL via INTRAVENOUS

## 2015-09-21 MED ORDER — HYDROMORPHONE HCL 2 MG PO TABS
2.0000 mg | ORAL_TABLET | ORAL | Status: DC | PRN
Start: 2015-09-21 — End: 2015-09-22
  Administered 2015-09-21 – 2015-09-22 (×7): 2 mg via ORAL
  Filled 2015-09-21 (×7): qty 1

## 2015-09-21 NOTE — Progress Notes (Signed)
Jake [name]  Clinical Social [name] has followed pt since first diagnosed and has provided support to both him and wife, Jake [name]. They are regular attendees of Brain Tumor Support group. Clinical Social Worker met with patient, Jake [name] and wife in the hospital to offer support and assess for needs. Wife shared pt has really been struggling with all of his "life changes and no quality of life".  Pt upset today that he can't go out and smoke. He is usually very calm and today he was pretty fixated on this issue. Wife appears in need of additional support due to caregiver burden. She is very hopeful pt can return to [facility] where she reports he was receiving PT, OT and speech therapies after his procedure at [facility]. 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 Pt shared his daughter, whom he has not seen in years is coming this weekend. CSW provided supportive listening and discussed how inpt CSW would assist with d/c needs. This CSW to follow for support through [facility].     Clinical Social [name] interventions: Emotional support Education Jake [name], [place] Social Worker [place]  [place] Phone: [phone] Fax: [phone]

## 2015-09-21 NOTE — Clinical Documentation Improvement (Signed)
Oncology  Based on the clinical findings below, please document any associated diagnoses/conditions the patient has or may have.   Brain Herniation  Cerebral Edema  Other  Clinically Undetermined  Please update your documentation within the medical record to reflect your response to this query. Thank you.  Supporting Information: (As per notes) "Will start him on Avastin in my clinic next week to reduce the edema in brain"  CT head without contrast: 09-19-15 FINDINGS: Large mass again noted in the posterior right cerebral hemisphere involving the right occipital lobe, parietal lobe, posterior temporal and frontal lobes. Areas of high density within the mass are unchanged and likely reflect calcifications. No evidence of hemorrhage. The overall mass has extended to now involve and cross the posterior corpus callosum. Increasing right to left midline shift, now all 9 mm. Mass-effect on the right lateral ventricle. No hydrocephalus.  IMPRESSION: Enlarging right posterior cerebral hemisphere mass, now crossing the posterior corpus callosum. No evidence for hemorrhage.  Increasing right to left midline shift, now 9 mm.  Please exercise your independent, professional judgment when responding. A specific answer is not anticipated or expected.  Thank You, Alessandra Grout, RN, BSN, CCDS,Clinical Documentation Specialist:  [phone]  [phone]=Cell [place]- Health Information Management

## 2015-09-21 NOTE — Evaluation (Signed)
Physical Therapy Evaluation Patient Details Name: Jake [name] MRN: [id number] DOB: [birth year] Today's Date: 09/21/2015   History of Present Illness  60 y.o. male with history of recurrent glioblastoma multiforme (enrolled in clinical vaccine trial at [place] last month). He has been at [facility] for rehabilitation and was brought to the ER after developing severe headache requiring increasing pain medications. CT head shows worsening mass with midline shift. He was admitted 09/19/15 for symptomatic management with Decadron and pain relief medications  Clinical Impression  Pt admitted with above diagnosis. Pt currently with functional limitations due to the deficits listed below (see PT Problem List).  Pt will benefit from skilled PT to increase their independence and safety with mobility to allow discharge to the venue listed below.  Pt requiring increased time and assist for mobility.  Pt likes to talk his way through transfer prior to mobilizing.  Pt present with left visual field deficits and states right visual field is blurry as well as fluctuations in vision ability.  Recommend return to SNF for continued rehab.     Follow Up Recommendations SNF    Equipment Recommendations  None recommended by PT    Recommendations for Other Services       Precautions / Restrictions Precautions Precautions: Fall Precaution Comments: changes in vision, usually very poor left visual field      Mobility  Bed Mobility Overal bed mobility: Needs Assistance Bed Mobility: Supine to Sit     Supine to sit: Min assist;HOB elevated     General bed mobility comments: pt requiring tactile cues due to poor vision, assist for trunk upright  Transfers Overall transfer level: Needs assistance Equipment used: Rolling walker (2 wheeled) Transfers: Sit to/from [facility] Sit to Stand: Mod assist;From elevated surface Stand pivot transfers: Mod assist       General transfer comment:  verbal cues for technique, hand placement, step by step cues for a couple steps over to recliner, posterior lean against bed and chair, assist due to weakness, balance, safety  Ambulation/Gait                Stairs            Wheelchair Mobility    Modified Rankin (Stroke Patients Only)       Balance Overall balance assessment: Needs assistance;History of Falls Sitting-balance support: Bilateral upper extremity supported;Feet supported Sitting balance-Leahy Scale: Poor Sitting balance - Comments: rqeuires UE support - states mostly due to pain meds vs trunk instability   Standing balance support: Bilateral upper extremity supported;During functional activity Standing balance-Leahy Scale: Zero Standing balance comment: pt requires increased assist to maintain balance                             Pertinent Vitals/Pain Pain Assessment: 0-10 Pain Score: 4  Pain Location: headache Pain Descriptors / Indicators: Headache Pain Intervention(s): Premedicated before session;Repositioned    Home Living Family/patient expects to be discharged to:: Skilled nursing facility                 Additional Comments: from [facility] rehab    Prior Function Level of Independence: Needs assistance         Comments: assist for mobility, more recently mosly transfers and w/c, has had fall at SNF     Hand Dominance        Extremity/Trunk Assessment   Upper Extremity Assessment: Generalized weakness  Lower Extremity Assessment: Generalized weakness         Communication   Communication: No difficulties  Cognition Arousal/Alertness: Awake/alert Behavior During Therapy: WFL for tasks assessed/performed Overall Cognitive Status: Within Functional Limits for tasks assessed                      General Comments      Exercises        Assessment/Plan    PT Assessment Patient needs continued PT services  PT Diagnosis Difficulty  walking;Generalized weakness   PT Problem List Decreased strength;Decreased activity tolerance;Decreased balance;Decreased mobility;Decreased knowledge of use of DME  PT Treatment Interventions DME instruction;Gait training;Functional mobility training;Patient/family education;Wheelchair mobility training;Therapeutic activities;Therapeutic exercise;Balance training;Neuromuscular re-education   PT Goals (Current goals can be found in the Care Plan section) Acute Rehab PT Goals PT Goal Formulation: With patient Time For Goal Achievement: 09/28/15 Potential to Achieve Goals: Fair    Frequency Min 3X/week   Barriers to discharge        Co-evaluation               End of Session Equipment Utilized During Treatment: Gait belt Activity Tolerance: Patient limited by fatigue Patient left: in chair;with call bell/phone within reach;with chair alarm set           Time: [id number] PT Time Calculation (min) (ACUTE ONLY): 28 min   Charges:   PT Evaluation $PT Eval Moderate Complexity: 1 Procedure PT Treatments $Therapeutic Activity: 8-22 mins   PT G Codes:        Jelicia Nantz,KATHrine E 09/21/2015, 4:25 PM Carmelia Bake, PT, DPT 09/21/2015 Pager: [phone]

## 2015-09-21 NOTE — Progress Notes (Signed)
[facility] PROGRESS NOTE  Jake [name] [id number] DOB: [birth year] DOA: 09/19/2015 PCP: [name], MD  Assessment/Plan: Principal Problem:   GBM (glioblastoma multiforme) ([place]) - Plan is to continue Decadron and when nearing discharge transition to oral regimen with f/u with Dr. Burr Medico - Will obtain MRI as recommended by oncologist. - Awaiting PT recommendations  Intractable headache - Secondary to principal problem palliative care on board and assisting  Active Problems:   Hypothyroidism - Continue Synthroid. No symptoms associated with hypothyroidism reported    Essential hypertension - Pt on lisinopril and verapamil    Code Status: full Family Communication: No family at bedside Disposition Plan: Most likely d/c to SNF next am.   Consultants:  None  Procedures:  None  Antibiotics:  None  HPI/Subjective: Pt has no new complaints. His HA are about the same currently  Objective: Filed Vitals:   09/20/15 2046 09/21/15 0542  BP: 127/76 128/82  Pulse: 70 80  Temp: 98 F (36.7 C) 97.8 F (36.6 C)  Resp: 16 16    Intake/Output Summary (Last 24 hours) at 09/21/15 1246 Last data filed at 09/21/15 0404  Gross per 24 hour  Intake    740 ml  Output    300 ml  Net    440 ml   Filed Weights   09/20/15 1850  Weight: 94.2 kg (207 lb 10.8 oz)    Exam:   General:  Pt in nad, alert and awake  Cardiovascular: rrr, no mrg  Respiratory: cta bl, no wheezes  Abdomen: soft, obese, nt  Musculoskeletal: no cyanosis or clubbing   Data Reviewed: Basic Metabolic Panel:  Recent Labs Lab 09/19/15 2140 09/20/15 0427  NA 143 140  K 3.7 3.8  CL 114* 111  CO2 20* 19*  GLUCOSE 114* 115*  BUN 33* 34*  CREATININE 0.85 0.74  CALCIUM 9.1 8.6*   Liver Function Tests:  Recent Labs Lab 09/20/15 0427  AST 26  ALT 55  ALKPHOS 77  BILITOT 0.8  PROT 5.2*  ALBUMIN 2.9*   No results for input(s): LIPASE, AMYLASE in the last 168 hours. No  results for input(s): AMMONIA in the last 168 hours. CBC:  Recent Labs Lab 09/19/15 2140 09/20/15 0427  WBC 7.2 6.9  NEUTROABS 5.9 5.7  HGB 10.8* 11.0*  HCT 32.7* 34.1*  MCV 88.9 89.7  PLT 159 164   Cardiac Enzymes: No results for input(s): CKTOTAL, CKMB, CKMBINDEX, TROPONINI in the last 168 hours. BNP (last 3 results) No results for input(s): BNP in the last 8760 hours.  ProBNP (last 3 results) No results for input(s): PROBNP in the last 8760 hours.  CBG: No results for input(s): GLUCAP in the last 168 hours.  Recent Results (from the past 240 hour(s))  MRSA PCR Screening     Status: None   Collection Time: 09/21/15  6:18 AM  Result Value Ref Range Status   MRSA by PCR NEGATIVE NEGATIVE Final    Comment:        The GeneXpert MRSA Assay (FDA approved for NASAL specimens only), is one component of a comprehensive MRSA colonization surveillance program. It is not intended to diagnose MRSA infection nor to guide or monitor treatment for MRSA infections.      Studies: Ct Head Wo Contrast  09/19/2015  CLINICAL DATA:  Glioblastoma.  Rule out acute hemorrhage. EXAM: CT HEAD WITHOUT CONTRAST TECHNIQUE: Contiguous axial images were obtained from the base of the skull through the vertex without intravenous contrast. COMPARISON:  MRI 07/03/2015.  CT 07/02/2015. FINDINGS: Large mass again noted in the posterior right cerebral hemisphere involving the right occipital lobe, parietal lobe, posterior temporal and frontal lobes. Areas of high density within the mass are unchanged and likely reflect calcifications. No evidence of hemorrhage. The overall mass has extended to now involve and cross the posterior corpus callosum. Increasing right to left midline shift, now all 9 mm. Mass-effect on the right lateral ventricle. No hydrocephalus. IMPRESSION: Enlarging right posterior cerebral hemisphere mass, now crossing the posterior corpus callosum. No evidence for hemorrhage. Increasing right  to left midline shift, now 9 mm. Electronically Signed   By: Rolm Baptise M.D.   On: 09/19/2015 18:56    Scheduled Meds: . amitriptyline  25 mg Oral QHS  . chlorhexidine  15 mL Mouth/Throat QID  . dexamethasone  6 mg Intravenous 4 times per day  . docusate sodium  100 mg Oral Daily  . levothyroxine  50 mcg Oral QAC breakfast  . lisinopril  40 mg Oral Daily  . multivitamin with minerals  1 tablet Oral Daily  . pantoprazole  40 mg Oral Daily  . senna  1 tablet Oral QHS  . sodium chloride  3 mL Intravenous Q12H  . verapamil  180 mg Oral QHS   Continuous Infusions:   Time spent: > 35 minutes   Velvet Bathe  [facility] Pager [phone] If 7PM-7AM, please contact night-coverage at [email or web address], password [facility] 09/21/2015, 12:46 PM  LOS: 2 days

## 2015-09-21 NOTE — Consult Note (Signed)
Consultation Note Date: 09/21/2015   Patient Name: Jake [name]  DOB: 1956-01-05  MRN: [id number]  Age / Sex: 60 y.o., male  PCP: [name], MD Referring Physician: Velvet Bathe, MD  Reason for Consultation: Establishing goals of care    Clinical Assessment/Narrative: Jake [name] is a 60 y.o. male with history of recurrent glioblastoma multiforme is enrolled in clinical vaccine trial at [place] last month. He has been at [facility] for rehabilitation and was brought to the ER after developing severe headache requiring increasing pain medications. CT head shows worsening mass with midline shift. He was admitted for symptomatic management with Decadron and pain relief medications. Dr. Annamaria Boots has seen patient earlier today. Palliative consulted for goals of care.  Contacts/Participants in Discussion: Patient and his wife Primary Decision Maker: Patient  Relationship to Patient Self  SUMMARY OF RECOMMENDATIONS - Dr. Annamaria Boots noted his scan is consistent with disease progression vs pseudo-progression from immunoresponse is also a possibility.  Dr. Imagene Riches (his physician at [facility]) will also review his scan to help determine disease progression. - Dr Annamaria Boots will start Avastin next week to reduce the edema in brain - Both he and his wife are in agreement that he would like to continue with his current therapies at this time. My main purpose today was to ensure that his symptoms are being adequately managed while introducing myself in order to start building therapeutic relationship. I will continue to explore long term goals with Mr. Crumpley this admission, but he and his wife are clear that they want to have further conversations with his entire team (including Dr. Imagene Riches) prior to making any further decisions.  Code Status/Advance Care Planning: Full code    Code Status Orders        Start     Ordered   09/20/15 0305  Full code   Continuous     09/20/15 0304    Code Status History    Date Active Date Inactive Code Status Order ID Comments User Context   07/03/2015  4:28 PM 07/04/2015  3:56 PM Full Code [id number]  Robbie Lis, MD ED   07/07/2014 12:49 PM 07/09/2014  5:22 PM Full Code [id number]  Elaina Hoops, MD Inpatient   07/02/2014  8:54 PM 07/07/2014 12:49 PM Full Code [id number]  Cordelia Poche, MD Inpatient      Symptom Management:   Headache: Pain is much better. Would recommend continue same regimen of Decadron with intermittent opioids as needed.  Palliative Prophylaxis:   Bowel Regimen and Delirium Protocol  Psycho-social/Spiritual:  Support System: [place] Desire for further Chaplaincy support:No Additional Recommendations: Caregiving  Support/Resources  Prognosis: Unable to determine  Discharge Planning: [place] for rehab with Palliative care service follow-up is likely   Chief Complaint/ Primary Diagnoses: Present on Admission:  . Intractable headache . Hypothyroidism . GBM (glioblastoma multiforme) ([place]) . Essential hypertension . Intractable pain . Headache . Glioblastoma ([place])  I have reviewed the medical record, interviewed the patient and family, and examined the patient. The following aspects are pertinent.  Past Medical History  Diagnosis Date  . Thyroid disease   . Hypertension   . Allergy   . Arthritis   . Sleep apnea   . Kidney stone   . Glioblastoma multiforme of occipital lobe ([name])   . Nocturnal leg cramps 09/29/2014  . History of radiation therapy 08/03/14- 09/15/14    right occipito-parietal brain/46 Gy 23 fx, right brain boost/ 14 Gy in 7 fx   Social History  Social History  . Marital Status: Married    Spouse Name: N/A  . Number of Children: 5  . Years of Education: college   Social History Main Topics  . Smoking status: Current Every Day Smoker -- 0.50 packs/day for 45 years    Types: Cigarettes  . Smokeless tobacco:  Never Used  . Alcohol Use: 0.0 oz/week    0 Standard drinks or equivalent per week     Comment: 2-3 beers per/day   . Drug Use: No  . Sexual Activity: Not Currently   Other Topics Concern  . None   Social History Narrative   Patient is right handed.   Patient drinks 2 cups daily      Family History  Problem Relation Age of Onset  . Hypertension Mother   . Diabetes Father   . Glaucoma Father   . Diabetes Brother   . Diabetes Brother   . Colon cancer Neg Hx    Scheduled Meds: . amitriptyline  25 mg Oral QHS  . chlorhexidine  15 mL Mouth/Throat QID  . dexamethasone  6 mg Intravenous 4 times per day  . docusate sodium  100 mg Oral Daily  . levothyroxine  50 mcg Oral QAC breakfast  . lisinopril  40 mg Oral Daily  . multivitamin with minerals  1 tablet Oral Daily  . pantoprazole  40 mg Oral Daily  . senna  1 tablet Oral QHS  . sodium chloride  3 mL Intravenous Q12H  . verapamil  180 mg Oral QHS   Continuous Infusions:  PRN Meds:.acetaminophen **OR** acetaminophen, hydrALAZINE, HYDROmorphone (DILAUDID) injection, ondansetron **OR** ondansetron (ZOFRAN) IV Medications Prior to Admission:  Prior to Admission medications   Medication Sig Start Date End Date Taking? Authorizing Provider  amitriptyline (ELAVIL) 25 MG tablet Take 25 mg by mouth at bedtime.  06/30/15  Yes Historical Provider, MD  calcium carbonate (TUMS EX) 750 MG chewable tablet Chew 2 tablets by mouth 2 (two) times daily.   Yes Historical Provider, MD  chlorhexidine (PERIDEX) 0.12 % solution Rinse with 15 mls three daily for 30 seconds. Use after breakfast, lunch and at bedtime. Spit out excess. Do not swallow. 05/10/15  Yes Lenn Cal, DDS  cyclobenzaprine (FLEXERIL) 10 MG tablet Take 10 mg by mouth 3 (three) times daily as needed for muscle spasms.   Yes Historical Provider, MD  dexamethasone (DECADRON) 4 MG tablet Take 1 tablet (4 mg total) by mouth 2 (two) times daily. Patient taking differently: Take 2 mg  by mouth 2 (two) times daily.  07/04/15  Yes Robbie Lis, MD  docusate sodium (COLACE) 100 MG capsule Take 100 mg by mouth daily.    Yes Historical Provider, MD  HYDROcodone-acetaminophen (NORCO/VICODIN) 5-325 MG tablet Take 1 tablet by mouth every 4 (four) hours as needed for moderate pain.   Yes Historical Provider, MD  LACTOBACILLUS PO Take 1 capsule by mouth daily.   Yes Historical Provider, MD  levothyroxine (SYNTHROID, LEVOTHROID) 50 MCG tablet Take 1 tablet (50 mcg total) by mouth daily before breakfast. 03/04/15  Yes Shawnee Knapp, MD  lisinopril (PRINIVIL,ZESTRIL) 40 MG tablet Take 1 tablet (40 mg total) by mouth daily. 03/04/15  Yes Shawnee Knapp, MD  LORazepam (ATIVAN) 0.5 MG tablet take 1 to 2 tablets by mouth every 8 hours if needed for ANXIETY, NAUSEA, OR SLEEP Patient taking differently: take 1  tablet by mouth every 8 hours if needed for ANXIETY, NAUSEA, OR SLEEP 08/07/15  Yes Truitt Merle,  MD  Multiple Vitamins-Minerals (MULTIVITAMIN WITH MINERALS) tablet Take 1 tablet by mouth daily.   Yes Historical Provider, MD  NEXIUM 24HR 20 MG capsule Take 20 mg by mouth daily.  10/21/14  Yes Historical Provider, MD  nystatin (MYCOSTATIN) 100000 UNIT/ML suspension Take 5 mLs (500,000 Units total) by mouth 4 (four) times daily. 07/07/15  Yes Truitt Merle, MD  senna (SENOKOT) 8.6 MG tablet Take 1 tablet by mouth at bedtime.    Yes Historical Provider, MD  verapamil (COVERA HS) 180 MG (CO) 24 hr tablet Take 1 tablet (180 mg total) by mouth at bedtime. 03/04/15  Yes Shawnee Knapp, MD  acetaminophen (TYLENOL) 325 MG tablet Take 2 tablets (650 mg total) by mouth every 6 (six) hours as needed for mild pain (or Fever >/= 101). Patient not taking: Reported on 09/19/2015 07/04/15   Robbie Lis, MD  butalbital-acetaminophen-caffeine (FIORICET WITH CODEINE) [phone] MG capsule Take 1 capsule by mouth every 4 (four) hours as needed for headache.    Historical Provider, MD  oxyCODONE (OXY IR/ROXICODONE) 5 MG immediate release  tablet Take 5-10 mg by mouth every 4 (four) hours as needed for severe pain.    Historical Provider, MD   Allergies  Allergen Reactions  . Other Nausea And Vomiting    Steroids - cause cramping all through the body - Headaches    Review of Systems  Constitutional: Positive for activity change, appetite change and fatigue.  Respiratory: Positive for cough.   Gastrointestinal: Positive for nausea.  Neurological: Positive for headaches.  Psychiatric/Behavioral: Positive for decreased concentration.  All other systems reviewed and are negative.   Physical Exam  Constitutional: No distress.  Eyes: Conjunctivae are normal. Pupils are equal, round, and reactive to light. No scleral icterus.  Neck: Normal range of motion. Neck supple.  Cardiovascular: Normal rate and regular rhythm.   Respiratory: Effort normal. He has no wheezes.  GI: Soft. Bowel sounds are normal. There is no tenderness.  Musculoskeletal: He exhibits edema.  Neurological: He is alert. No cranial nerve deficit.  Skin: Skin is warm and dry.  Psychiatric: He has a normal mood and affect. Thought content normal.    Vital Signs: BP 128/82 mmHg  Pulse 80  Temp(Src) 97.8 F (36.6 C) (Axillary)  Resp 16  Ht 5\' 11"  (1.803 m)  Wt 94.2 kg (207 lb 10.8 oz)  BMI 28.98 kg/m2  SpO2 94%  SpO2: SpO2: 94 % O2 Device:SpO2: 94 % O2 Flow Rate: .   IO: Intake/output summary:  Intake/Output Summary (Last 24 hours) at 09/21/15 0746 Last data filed at 09/21/15 0404  Gross per 24 hour  Intake    740 ml  Output    300 ml  Net    440 ml    LBM: Last BM Date: 09/20/15 Baseline Weight: Weight: 94.2 kg (207 lb 10.8 oz) Most recent weight: Weight: 94.2 kg (207 lb 10.8 oz)      Palliative Assessment/Data:  Flowsheet Rows        Most Recent Value   Intake Tab    Referral Department  Hospitalist   Unit at Time of Referral  ER   Palliative Care Primary Diagnosis  Cancer   Date Notified  09/20/15   Palliative Care Type  New  Palliative care   Reason for referral  Clarify Goals of Care   Date of Admission  09/19/15   Date first seen by Palliative Care  09/20/15   # of days Palliative referral response time  0  Day(s)   # of days IP prior to Palliative referral  1   Clinical Assessment    Palliative Performance Scale Score  40%   Pain Max last 24 hours  10   Pain Min Last 24 hours  5   Psychosocial & Spiritual Assessment    Palliative Care Outcomes       Additional Data Reviewed:  CBC:    Component Value Date/Time   WBC 6.9 09/20/2015 0427   WBC 11.1* 07/07/2015 1312   WBC 5.0 01/31/2013 1257   HGB 11.0* 09/20/2015 0427   HGB 14.2 07/07/2015 1312   HGB 13.1* 01/31/2013 1257   HCT 34.1* 09/20/2015 0427   HCT 43.4 07/07/2015 1312   HCT 42.1* 01/31/2013 1257   PLT 164 09/20/2015 0427   PLT 224 07/07/2015 1312   MCV 89.7 09/20/2015 0427   MCV 89.4 07/07/2015 1312   MCV 94.3 01/31/2013 1257   NEUTROABS 5.7 09/20/2015 0427   NEUTROABS 8.3* 07/07/2015 1312   LYMPHSABS 1.0 09/20/2015 0427   LYMPHSABS 1.6 07/07/2015 1312   MONOABS 0.3 09/20/2015 0427   MONOABS 1.0* 07/07/2015 1312   EOSABS 0.0 09/20/2015 0427   EOSABS 0.0 07/07/2015 1312   BASOSABS 0.0 09/20/2015 0427   BASOSABS 0.1 07/07/2015 1312   Comprehensive Metabolic Panel:    Component Value Date/Time   NA 140 09/20/2015 0427   NA 138 07/07/2015 1312   K 3.8 09/20/2015 0427   K 4.0 07/07/2015 1312   CL 111 09/20/2015 0427   CO2 19* 09/20/2015 0427   CO2 22 07/07/2015 1312   BUN 34* 09/20/2015 0427   BUN 14.3 07/07/2015 1312   CREATININE 0.74 09/20/2015 0427   CREATININE 0.8 07/07/2015 1312   CREATININE 0.86 01/25/2014 1015   GLUCOSE 115* 09/20/2015 0427   GLUCOSE 92 07/07/2015 1312   CALCIUM 8.6* 09/20/2015 0427   CALCIUM 9.8 07/07/2015 1312   AST 26 09/20/2015 0427   AST 25 07/07/2015 1312   ALT 55 09/20/2015 0427   ALT 30 07/07/2015 1312   ALKPHOS 77 09/20/2015 0427   ALKPHOS 62 07/07/2015 1312   BILITOT 0.8 09/20/2015  0427   BILITOT 0.43 07/07/2015 1312   PROT 5.2* 09/20/2015 0427   PROT 6.1* 07/07/2015 1312   ALBUMIN 2.9* 09/20/2015 0427   ALBUMIN 3.1* 07/07/2015 1312     Time In: 1740 Time Out: 1820 Time Total: 40 Greater than 50%  of this time was spent counseling and coordinating care related to the above assessment and plan.  Signed by: Micheline Rough, MD  Micheline Rough, MD  09/21/2015, 7:46 AM  Please contact Palliative Medicine Team phone at [phone] for questions and concerns.

## 2015-09-21 NOTE — Progress Notes (Signed)
Daily Progress Note   Patient Name: Jake [name]       Date: 09/21/2015 DOB: 1956/03/05  Age: 60 y.o. MRN#: [id number] Attending Physician: Jake Bathe, MD Primary Care Physician: Jake Haber, MD Admit Date: 09/19/2015  Reason for Consultation/Follow-up: Establishing goals of care  Subjective: He reports that his pain has been under much better control.  Currently report 4-5/10.    We discussed goals moving forward as well.  See below.  Length of Stay: 2 days  Current Medications: Scheduled Meds:  . amitriptyline  25 mg Oral QHS  . chlorhexidine  15 mL Mouth/Throat QID  . dexamethasone  6 mg Intravenous 4 times per day  . docusate sodium  100 mg Oral Daily  . levothyroxine  50 mcg Oral QAC breakfast  . lisinopril  40 mg Oral Daily  . multivitamin with minerals  1 tablet Oral Daily  . pantoprazole  40 mg Oral Daily  . senna  1 tablet Oral QHS  . sodium chloride  3 mL Intravenous Q12H  . verapamil  180 mg Oral QHS    Continuous Infusions:    PRN Meds: acetaminophen **OR** acetaminophen, hydrALAZINE, HYDROmorphone, ondansetron **OR** ondansetron (ZOFRAN) IV  Palliative Performance Scale: 40%     Vital Signs: BP 128/82 mmHg  Pulse 80  Temp(Src) 97.8 F (36.6 C) (Axillary)  Resp 16  Ht 5\' 11"  (1.803 m)  Wt 94.2 kg (207 lb 10.8 oz)  BMI 28.98 kg/m2  SpO2 94% SpO2: SpO2: 94 % O2 Device: O2 Device: Not Delivered O2 Flow Rate:    Intake/output summary:  Intake/Output Summary (Last 24 hours) at 09/21/15 1127 Last data filed at 09/21/15 0404  Gross per 24 hour  Intake    740 ml  Output    300 ml  Net    440 ml   LBM:   Baseline Weight: Weight: 94.2 kg (207 lb 10.8 oz) Most recent weight: Weight: 94.2 kg (207 lb 10.8 oz)  Physical Exam: Constitutional: No distress.  Eyes: Conjunctivae are normal. Pupils are equal, round, and reactive to light. No scleral icterus.  Neck: Normal range of motion. Neck supple.  Cardiovascular: Normal rate and regular  rhythm.  Respiratory: Effort normal. He has no wheezes.  GI: Soft. Bowel sounds are normal. There is no tenderness.  Musculoskeletal: He exhibits edema.  Neurological: He is alert. No cranial nerve deficit.  Skin: Skin is warm and dry.  Psychiatric: He has a normal mood             Additional Data Reviewed: Recent Labs     09/19/15  2140  09/20/15  0427  WBC  7.2  6.9  HGB  10.8*  11.0*  PLT  159  164  NA  143  140  BUN  33*  34*  CREATININE  0.85  0.74     Problem List:  Patient Active Problem List   Diagnosis Date Noted  . Glioblastoma ([place]) 09/20/2015  . Intractable pain 09/19/2015  . Headache 09/19/2015  . Exposed mandible 07/11/2015  . Intractable headache 07/03/2015  . Depression 07/03/2015  . Fever, unspecified   . GBM (glioblastoma multiforme) ([place]) 07/22/2014  . Essential hypertension   . Hypothyroidism 01/31/2013     Palliative Care Assessment & Plan    Code Status:  Full code  Goals of Care: - Both he and his wife are in agreement that he would like to continue with his current therapies at this time.  Jake [name] and his wife are clear  that they want to have further conversations with his entire team (including Dr. Imagene [name] at [facility]) prior to making any further decisions.  His wife reports being hopeful that he will be cured of his cancer with his current therapies. I introduced a MOST form to them today, and his wife would like to review with him and decide if they would like to complete prior to discharge. - He has been living at [facility].  His wife reports that she would prefer to take him home, but they do not have anyone that will be available to be with him 24 hours a day as she works.  She has asked to speak to SW about options.   Symptom Management:  Pain: Headaches better controlled since initiation of Decadron and dilaudid.  Has used 4 rescue doses of dilaudid in the last 24 hours.  Discussed with patient and his wife and will plan to  transition to PO regimen with dilaudid as he reports that this seems to work better.  His wife is concerned with sedation and will therefore start at dilaudid 2mg  Q3hr PRN.  This would roughly be equivalent to 0.4mg  IV dilaudid, which is slightly less than the amount he has been getting that has controlled his pain but caused some sedation.  Psycho-social/Spiritual:  Desire for further Chaplaincy support:Yes.   Prognosis: Unable to determine Discharge Planning: To be determined.     Care plan was discussed with Patient, his wife, Dr. Wendee [name]  Thank you for allowing the Palliative Medicine Team to assist in the care of this patient.   Time In: 1050 Time Out: 1120 Total Time 30 Prolonged Time Billed No    Greater than 50%  of this time was spent counseling and coordinating care related to the above assessment and plan.   Jake Rough, MD  09/21/2015, 11:27 AM  Please contact Palliative Medicine Team phone at [phone] for questions and concerns.

## 2015-09-21 NOTE — Progress Notes (Signed)
Spiritual Care Note  Following pt, who goes by Jake [name], and wife [name] from [facility] for ongoing pt/family support, including their participation in brain cancer support group.  Jake [name] had difficulty remembering time accurately (per corrections from [name]) during encounter.  He verbalized deep gratitude for SW and chaplain (emotional support and availability) and support group (reducing isolation, sharing info and coping strategies).  Pt also named that he has lost several friends recently, indicating that he his own health situation has precluded processing his grief.  [name] plans to take off work tomorrow and Saturday in order to be present with pt through d/c, return to [facility], and other transition needs.  They appeared emotionally down and subdued, but did not discuss concerns about prognosis/mortality.  Provided pastoral reflection, encouragement, and reminder of ongoing availability for emotional support individually or together about anything they wish to discuss (including fear, mortality, hope, etc).  Plan to f/u by phone particularly to enable 1:1 support, as couple practices positivity together.  Please also page as needs arise/circumstances change.  Thank you.  [place], [place], [facility] Pager [phone] Voicemail  [phone]

## 2015-09-21 NOTE — Progress Notes (Signed)
Patient had not voided all day.  Patient bladder scanned.  284mL in bladder.  Patient encouraged to void.  Able to void 367mL of urine.

## 2015-09-21 NOTE — Telephone Encounter (Signed)
per pof to sch appt-sent MW/Melissa email to sch trmt-to call pt after reply

## 2015-09-22 ENCOUNTER — Telehealth: Payer: Self-pay | Admitting: Hematology

## 2015-09-22 ENCOUNTER — Other Ambulatory Visit: Payer: Self-pay | Admitting: Medical Oncology

## 2015-09-22 DIAGNOSIS — C719 Malignant neoplasm of brain, unspecified: Secondary | ICD-10-CM

## 2015-09-22 MED ORDER — LORAZEPAM 0.5 MG PO TABS: ORAL_TABLET | ORAL | Status: DC

## 2015-09-22 MED ORDER — DEXAMETHASONE 4 MG PO TABS
6.0000 mg | ORAL_TABLET | Freq: Four times a day (QID) | ORAL | Status: DC
  Administered 2015-09-22: 6 mg via ORAL
  Filled 2015-09-22: qty 2

## 2015-09-22 MED ORDER — CYCLOBENZAPRINE HCL 10 MG PO TABS: 10.0000 mg | ORAL_TABLET | Freq: Three times a day (TID) | ORAL | Status: AC | PRN

## 2015-09-22 MED ORDER — HYDROCODONE-ACETAMINOPHEN 5-325 MG PO TABS: 1.0000 | ORAL_TABLET | ORAL | Status: DC | PRN

## 2015-09-22 MED ORDER — DEXAMETHASONE 6 MG PO TABS: 6.0000 mg | ORAL_TABLET | Freq: Four times a day (QID) | ORAL | Status: DC

## 2015-09-22 MED ORDER — SENNOSIDES 8.6 MG PO TABS: 1.0000 | ORAL_TABLET | Freq: Every day | ORAL | Status: DC | PRN

## 2015-09-22 NOTE — Telephone Encounter (Signed)
per poreply-cld pt and left message of time & date of 09/25/15 appt

## 2015-09-22 NOTE — Progress Notes (Signed)
Patient is set to discharge back to [facility] today. [employer] authorization obtained. Patient & wife, Jake [name] at bedside aware. Discharge packet given to RN, Legrand Rams. [facility] called for transport.     Raynaldo Opitz, [facility] Clinical Social Worker cell #: [phone]

## 2015-09-22 NOTE — Discharge Summary (Signed)
Physician Discharge Summary  Jake [name] [id number] DOB: 12-15-1955 DOA: 09/19/2015  PCP: [name], MD  Admit date: 09/19/2015 Discharge date: 09/22/2015  Time spent: > 35 minutes  Recommendations for Outpatient Follow-up:  1. Pt to f/u with his oncologist Dr. Annamaria Boots in 1 week.  2. MRI of brain obtained in the hospital so that his oncologist at [facility] may review 3. Please titrate pain medications as needed pending patient's pain level   Discharge Diagnoses:  Principal Problem:   Intractable headache Active Problems:   Hypothyroidism   Essential hypertension   GBM (glioblastoma multiforme) (HCC)   Intractable pain   Headache   Glioblastoma ([place])   Discharge Condition: stable  Diet recommendation: Heart healthy   Filed Weights   09/20/15 1850  Weight: 94.2 kg (207 lb 10.8 oz)    History of present illness:  From original HPI: 60 y.o. male with history of recurrent glioblastoma multiforme who has recently received treatment at [facility] last month - AIRO guided right tempora sterostatic brain biopsy with placement of two therapeutic catheters. The frozen pathology showed recurrent malignant glioma. Infusion of D2C7 vaccine was started on 08/22/15. Post infusion, his catheters were removed on 08/25/15 and eventually was admitted to rehabilitation was brought to the ER after patient was having severe headache requiring increasing pain medications. CT head shows worsening mass with midline shift and he had physician had discussed with [name] oncology and also Dr. Irene Limbo, oncologist on call at [facility]. At this time it is decided that patient will be admitted for symptomatic management with Decadron and pain relief medications.   Hospital Course:  Glioblastoma multiforme - Improved after decadron administration - plan is for patient to f/u with Dr. Annamaria Boots in 1 week for further evaluation and recommendations. Please refer to her note on 09/20/15 - Continue pain medication for  symptom relief  For known comorbidities listed above will continue prior to admission medications. Changed patient's Senna to as needed.  Procedures:  MRI of brain obtained  Consultations:  Palliative Care  Oncology  Discharge Exam: Filed Vitals:   09/21/15 2120 09/22/15 0524  BP: 103/57 120/58  Pulse: 73 81  Temp: 97.7 F (36.5 C) 98.2 F (36.8 C)  Resp: 17 18    General: Pt in nad, alert and awake Cardiovascular: rrr, no mrg Respiratory: cta bl, no wheezes  Discharge Instructions   Discharge Instructions    Call MD for:  difficulty breathing, headache or visual disturbances    Complete by:  As directed      Call MD for:  severe uncontrolled pain    Complete by:  As directed      Call MD for:  temperature >100.4    Complete by:  As directed      Diet - low sodium heart healthy    Complete by:  As directed      Discharge instructions    Complete by:  As directed   Please follow up with your oncologist in 1 week (Dr. Annamaria Boots).     Increase activity slowly    Complete by:  As directed           Current Discharge Medication List    CONTINUE these medications which have CHANGED   Details  cyclobenzaprine (FLEXERIL) 10 MG tablet Take 1 tablet (10 mg total) by mouth 3 (three) times daily as needed for muscle spasms. Qty: 30 tablet, Refills: 0    dexamethasone (DECADRON) 6 MG tablet Take 1 tablet (6 mg total) by mouth every  6 (six) hours. Qty: 56 tablet, Refills: 0    HYDROcodone-acetaminophen (NORCO/VICODIN) 5-325 MG tablet Take 1 tablet by mouth every 4 (four) hours as needed for moderate pain. Qty: 30 tablet, Refills: 0    LORazepam (ATIVAN) 0.5 MG tablet take 1  tablet by mouth every 8 hours if needed for ANXIETY, NAUSEA, OR SLEEP Qty: 90 tablet, Refills: 0    senna (SENOKOT) 8.6 MG tablet Take 1 tablet (8.6 mg total) by mouth daily as needed for constipation.      CONTINUE these medications which have NOT CHANGED   Details  amitriptyline (ELAVIL) 25 MG  tablet Take 25 mg by mouth at bedtime.     calcium carbonate (TUMS EX) 750 MG chewable tablet Chew 2 tablets by mouth 2 (two) times daily.    chlorhexidine (PERIDEX) 0.12 % solution Rinse with 15 mls three daily for 30 seconds. Use after breakfast, lunch and at bedtime. Spit out excess. Do not swallow. Qty: 1440 mL, Refills: prn    docusate sodium (COLACE) 100 MG capsule Take 100 mg by mouth daily.     LACTOBACILLUS PO Take 1 capsule by mouth daily.    levothyroxine (SYNTHROID, LEVOTHROID) 50 MCG tablet Take 1 tablet (50 mcg total) by mouth daily before breakfast. Qty: 90 tablet, Refills: 3    lisinopril (PRINIVIL,ZESTRIL) 40 MG tablet Take 1 tablet (40 mg total) by mouth daily. Qty: 90 tablet, Refills: 3    Multiple Vitamins-Minerals (MULTIVITAMIN WITH MINERALS) tablet Take 1 tablet by mouth daily.    NEXIUM 24HR 20 MG capsule Take 20 mg by mouth daily.  Refills: 0    nystatin (MYCOSTATIN) 100000 UNIT/ML suspension Take 5 mLs (500,000 Units total) by mouth 4 (four) times daily. Qty: 473 mL, Refills: 1    verapamil (COVERA HS) 180 MG (CO) 24 hr tablet Take 1 tablet (180 mg total) by mouth at bedtime. Qty: 90 tablet, Refills: 3    acetaminophen (TYLENOL) 325 MG tablet Take 2 tablets (650 mg total) by mouth every 6 (six) hours as needed for mild pain (or Fever >/= 101). Qty: 30 tablet, Refills: 0    butalbital-acetaminophen-caffeine (FIORICET WITH CODEINE) 50-325-40-30 MG capsule Take 1 capsule by mouth every 4 (four) hours as needed for headache.      STOP taking these medications     oxyCODONE (OXY IR/ROXICODONE) 5 MG immediate release tablet        Allergies  Allergen Reactions  . Other Nausea And Vomiting    Steroids - cause cramping all through the body - Headaches      The results of significant diagnostics from this hospitalization (including imaging, microbiology, ancillary and laboratory) are listed below for reference.    Significant Diagnostic Studies: Ct  Head Wo Contrast  09/19/2015  CLINICAL DATA:  Glioblastoma.  Rule out acute hemorrhage. EXAM: CT HEAD WITHOUT CONTRAST TECHNIQUE: Contiguous axial images were obtained from the base of the skull through the vertex without intravenous contrast. COMPARISON:  MRI 07/03/2015.  CT 07/02/2015. FINDINGS: Large mass again noted in the posterior right cerebral hemisphere involving the right occipital lobe, parietal lobe, posterior temporal and frontal lobes. Areas of high density within the mass are unchanged and likely reflect calcifications. No evidence of hemorrhage. The overall mass has extended to now involve and cross the posterior corpus callosum. Increasing right to left midline shift, now all 9 mm. Mass-effect on the right lateral ventricle. No hydrocephalus. IMPRESSION: Enlarging right posterior cerebral hemisphere mass, now crossing the posterior corpus callosum. No evidence  for hemorrhage. Increasing right to left midline shift, now 9 mm. Electronically Signed   By: Rolm Baptise M.D.   On: 09/19/2015 18:56   Mr [name] [id number] Contrast  09/21/2015  CLINICAL DATA:  60 year old male with recurrent glioblastoma enrolled in clinical vaccine trial at [place] last month. Recently developed severe headache requiring increase pain medication. Subsequent encounter. EXAM: MRI HEAD WITHOUT AND WITH CONTRAST TECHNIQUE: Multiplanar, multiecho pulse sequences of the brain and surrounding structures were obtained without and with intravenous contrast. CONTRAST:  62mL MULTIHANCE GADOBENATE DIMEGLUMINE 529 MG/ML IV SOLN COMPARISON:  Head CT without contrast 09/19/2015. Restaging Brain MRI 07/03/2015. FINDINGS: Significant interval worsening of abnormal masslike T2 hyperintensity in the brain, now extensively affecting the splenium of the corpus callosum and tracking across midline (series 6, image 18), extending into the right deep gray matter nuclei (image 17) and farther anteriorly into the right temporal lobe (image 15).  Associated diffusion signal abnormality also has significantly increased, with a confluent rim of restricted diffusion adjacent to the right atrium (series 400, image 31). Also, there is abnormal restricted diffusion in the splenium which is new (series 4, image 30). Subsequent increased leftward midline shift of 8-9 mm (4 mm previously). There might be early trapping of the left lateral ventricle, where the temporal and occipital horns are mildly increased (series 7, image 14). Following contrast there is less confluent enhancement in the right occipital pole (series 11, image 24), but increased stellate enhancement about the atrium of the right lateral ventricle (image 29). The lesion only crosses midline at the splenium, and the advancing edge of abnormal T2 hyperintensity and T1 hypo intensity does not enhance. Chronic blood products in the right occipital lobe are stable. Overlying post craniotomy changes. Basilar cisterns remain patent. Brainstem, cerebellum, cervicomedullary junction, and visualized cervical spine remain normal. Major intracranial vascular flow voids are stable. No diffusion abnormality to suggest acute infarction. No acute intracranial hemorrhage identified. Bone marrow signal remains normal. Mastoids and paranasal sinuses remain clear. Stable orbit and scalp soft tissues. IMPRESSION: Significant worsening of mass like T2 hyperintensity in the right hemisphere, with new extensive involvement of the splenium of the corpus callosum which tracks across midline to the left. Subsequent increased intracranial mass effect with leftward midline shift now 8-9 mm (4 mm in October). Suspected early trapping of the left lateral ventricle. True progression of disease was strongly favored in October considering the patient had not yet begun new therapy at that time. However, if the patient has undergone interval vaccination/immune therapy at [facility] then there is a chance that these changes since October are  treatment effect. No new intracranial abnormality. Electronically Signed   By: Genevie Ann M.D.   On: 09/21/2015 13:45    Microbiology: Recent Results (from the past 240 hour(s))  MRSA PCR Screening     Status: None   Collection Time: 09/21/15  6:18 AM  Result Value Ref Range Status   MRSA by PCR NEGATIVE NEGATIVE Final    Comment:        The GeneXpert MRSA Assay (FDA approved for NASAL specimens only), is one component of a comprehensive MRSA colonization surveillance program. It is not intended to diagnose MRSA infection nor to guide or monitor treatment for MRSA infections.      Labs: Basic Metabolic Panel:  Recent Labs Lab 09/19/15 2140 09/20/15 0427  NA 143 140  K 3.7 3.8  CL 114* 111  CO2 20* 19*  GLUCOSE 114* 115*  BUN 33* 34*  CREATININE 0.85 0.74  CALCIUM 9.1 8.6*   Liver Function Tests:  Recent Labs Lab 09/20/15 0427  AST 26  ALT 55  ALKPHOS 77  BILITOT 0.8  PROT 5.2*  ALBUMIN 2.9*   No results for input(s): LIPASE, AMYLASE in the last 168 hours. No results for input(s): AMMONIA in the last 168 hours. CBC:  Recent Labs Lab 09/19/15 2140 09/20/15 0427  WBC 7.2 6.9  NEUTROABS 5.9 5.7  HGB 10.8* 11.0*  HCT 32.7* 34.1*  MCV 88.9 89.7  PLT 159 164   Cardiac Enzymes: No results for input(s): CKTOTAL, CKMB, CKMBINDEX, TROPONINI in the last 168 hours. BNP: BNP (last 3 results) No results for input(s): BNP in the last 8760 hours.  ProBNP (last 3 results) No results for input(s): PROBNP in the last 8760 hours.  CBG: No results for input(s): GLUCAP in the last 168 hours.     Signed:  Velvet Bathe MD.  [facility] 09/22/2015, 9:55 AM

## 2015-09-22 NOTE — NC FL2 (Signed)
[place] LEVEL OF CARE SCREENING TOOL     IDENTIFICATION  Patient Name: Jake [name] Birthdate: May 26, 1956 Sex: male Admission Date (Current Location): 09/19/2015  [facility] and [place] Number:  Herbalist and Address:  [facility],  [place] [address], [place]      Provider Number: [id number]  Attending Physician Name and Address:  Velvet Bathe, MD  Relative Name and Phone Number:       Current Level of Care: Hospital Recommended Level of Care: [place] Prior Approval Number:    Date Approved/Denied:   PASRR Number: [id number]  Discharge Plan: SNF    Current Diagnoses: Patient Active Problem List   Diagnosis Date Noted  . Glioblastoma ([place]) 09/20/2015  . Intractable pain 09/19/2015  . Headache 09/19/2015  . Exposed mandible 07/11/2015  . Intractable headache 07/03/2015  . Depression 07/03/2015  . Fever, unspecified   . GBM (glioblastoma multiforme) ([place]) 07/22/2014  . Essential hypertension   . Hypothyroidism 01/31/2013    Orientation RESPIRATION BLADDER Height & Weight    Self, Time, Situation, Place  Normal Continent   207 lbs.  BEHAVIORAL SYMPTOMS/MOOD NEUROLOGICAL BOWEL NUTRITION STATUS      Incontinent Diet (Regular)  AMBULATORY STATUS COMMUNICATION OF NEEDS Skin   Extensive Assist Verbally Normal                       Personal Care Assistance Level of Assistance  Bathing, Dressing Bathing Assistance: Maximum assistance   Dressing Assistance: Maximum assistance     Functional Limitations Info  Sight Sight Info: Impaired        SPECIAL CARE FACTORS FREQUENCY                       Contractures      Additional Factors Info  Code Status, Allergies Code Status Info: FULL Allergies Info: "Other"           Current Medications (09/22/2015):  This is the current hospital active medication list Current Facility-Administered Medications  Medication Dose Route  Frequency Provider Last Rate Last Dose  . acetaminophen (TYLENOL) tablet 650 mg  650 mg Oral Q6H PRN Rise Patience, MD       Or  . acetaminophen (TYLENOL) suppository 650 mg  650 mg Rectal Q6H PRN Rise Patience, MD      . amitriptyline (ELAVIL) tablet 25 mg  25 mg Oral QHS Rise Patience, MD   25 mg at 09/21/15 2120  . chlorhexidine (PERIDEX) 0.12 % solution 15 mL  15 mL Mouth/Throat QID Rise Patience, MD   15 mL at 09/22/15 1004  . dexamethasone (DECADRON) tablet 6 mg  6 mg Oral 4 times per day Velvet Bathe, MD      . docusate sodium (COLACE) capsule 100 mg  100 mg Oral Daily Rise Patience, MD   100 mg at 09/22/15 1000  . hydrALAZINE (APRESOLINE) injection 10 mg  10 mg Intravenous Q4H PRN Rise Patience, MD      . HYDROmorphone (DILAUDID) tablet 2 mg  2 mg Oral Q3H PRN Micheline Rough, MD   2 mg at 09/22/15 1006  . levothyroxine (SYNTHROID, LEVOTHROID) tablet 50 mcg  50 mcg Oral QAC breakfast Rise Patience, MD   50 mcg at 09/22/15 [phone]  . lisinopril (PRINIVIL,ZESTRIL) tablet 40 mg  40 mg Oral Daily Rise Patience, MD   40 mg at 09/22/15 1000  . multivitamin  with minerals tablet 1 tablet  1 tablet Oral Daily Rise Patience, MD   1 tablet at 09/22/15 1000  . ondansetron (ZOFRAN) tablet 4 mg  4 mg Oral Q6H PRN Rise Patience, MD       Or  . ondansetron [facility]) injection 4 mg  4 mg Intravenous Q6H PRN Rise Patience, MD      . pantoprazole (PROTONIX) EC tablet 40 mg  40 mg Oral Daily Rise Patience, MD   40 mg at 09/22/15 1000  . senna (SENOKOT) tablet 8.6 mg  1 tablet Oral QHS Rise Patience, MD   8.6 mg at 09/21/15 2118  . sodium chloride 0.9 % injection 3 mL  3 mL Intravenous Q12H Rise Patience, MD   3 mL at 09/21/15 2118  . verapamil (CALAN-SR) CR tablet 180 mg  180 mg Oral QHS Rise Patience, MD   180 mg at 09/21/15 2118     Discharge Medications: Please see discharge summary for a list of discharge  medications.  Relevant Imaging Results:  Relevant Lab Results:   Additional Information SSN: [id number]  Linna Darner, LCSW

## 2015-09-25 ENCOUNTER — Other Ambulatory Visit: Payer: Self-pay | Admitting: *Deleted

## 2015-09-25 ENCOUNTER — Telehealth: Payer: Self-pay | Admitting: *Deleted

## 2015-09-25 ENCOUNTER — Encounter: Payer: Self-pay | Admitting: Nurse Practitioner

## 2015-09-25 ENCOUNTER — Non-Acute Institutional Stay (SKILLED_NURSING_FACILITY): Payer: BLUE CROSS/BLUE SHIELD | Admitting: Internal Medicine

## 2015-09-25 ENCOUNTER — Other Ambulatory Visit: Payer: Self-pay

## 2015-09-25 ENCOUNTER — Ambulatory Visit: Payer: Self-pay

## 2015-09-25 ENCOUNTER — Other Ambulatory Visit: Payer: Self-pay | Admitting: Hematology

## 2015-09-25 DIAGNOSIS — K5900 Constipation, unspecified: Secondary | ICD-10-CM

## 2015-09-25 DIAGNOSIS — E038 Other specified hypothyroidism: Secondary | ICD-10-CM | POA: Diagnosis not present

## 2015-09-25 DIAGNOSIS — F411 Generalized anxiety disorder: Secondary | ICD-10-CM

## 2015-09-25 DIAGNOSIS — I10 Essential (primary) hypertension: Secondary | ICD-10-CM | POA: Diagnosis not present

## 2015-09-25 DIAGNOSIS — M62838 Other muscle spasm: Secondary | ICD-10-CM | POA: Diagnosis not present

## 2015-09-25 DIAGNOSIS — E46 Unspecified protein-calorie malnutrition: Secondary | ICD-10-CM

## 2015-09-25 DIAGNOSIS — R519 Headache, unspecified: Secondary | ICD-10-CM

## 2015-09-25 DIAGNOSIS — R51 Headache: Secondary | ICD-10-CM

## 2015-09-25 DIAGNOSIS — C719 Malignant neoplasm of brain, unspecified: Secondary | ICD-10-CM

## 2015-09-25 DIAGNOSIS — K219 Gastro-esophageal reflux disease without esophagitis: Secondary | ICD-10-CM | POA: Diagnosis not present

## 2015-09-25 DIAGNOSIS — R5381 Other malaise: Secondary | ICD-10-CM

## 2015-09-25 LAB — TSH: TSH: 0.19 u[IU]/mL — AB (ref 0.41–5.90)

## 2015-09-25 MED ORDER — HYDROCODONE-ACETAMINOPHEN 5-325 MG PO TABS: 1.0000 | ORAL_TABLET | ORAL | Status: DC | PRN

## 2015-09-25 NOTE — Telephone Encounter (Signed)
[facility] 

## 2015-09-25 NOTE — Progress Notes (Signed)
Patient ID: Jake [name], male   DOB: [birth year], 60 y.o.   MRN: [id number]     [facility]   PCP: [name], MD  Code Status: full code  Allergies  Allergen Reactions  . Other Nausea And Vomiting    Steroids - cause cramping all through the body - Headaches    Chief Complaint  Patient presents with  . Readmit To SNF     HPI:  60 y.o. patient is here for short term rehabilitation post hospital re-admission from 09/19/15-09/22/15 with intractable headache. He has history of glioblastoma multiforme and is currently on a vaccine clinical trial at [facility]. He was given decadron with pain medication and his pain improved. He was seen by oncology Dr Burr Medico. CT head and MRI brain showed progression of the disease as below. He is seen in his room today. He was trying to get out of his bed and had some behavior changes noted this am around 4 by staff and received a dosing of ativan. He is somnolent this am. He is participating in HPI and ROS but falling asleep in between conversation. He complaints of intermittent headaches.  Review of Systems:  Constitutional: Negative for fever, chills. Positive for generalized weakness HENT: Negative for congestion, nasal discharge, difficulty swallowing.   Eyes: Negative for double vision and discharge.  Respiratory: Negative for cough, shortness of breath and wheezing.   Cardiovascular: Negative for chest pain, palpitations, leg swelling.  Gastrointestinal: Negative for heartburn, nausea, vomiting, abdominal pain. Appetite is poor. Had bowel movement yesterday Genitourinary: Negative for dysuria and flank pain.  Musculoskeletal: Negative for back pain Skin: Negative for rash.  Neurological: Negative for dizziness    Past Medical History  Diagnosis Date  . Thyroid disease   . Hypertension   . Allergy   . Arthritis   . Sleep apnea   . Kidney stone   . Glioblastoma multiforme of occipital lobe ([place])   .  Nocturnal leg cramps 09/29/2014  . History of radiation therapy 08/03/14- 09/15/14    right occipito-parietal brain/46 Gy 23 fx, right brain boost/ 14 Gy in 7 fx   Past Surgical History  Procedure Laterality Date  . Knee arthroscopy w/ meniscal repair    . Treatment fistula anal    . Craniotomy N/A 07/07/2014    Procedure: CRANIOTOMY TUMOR EXCISION/CURVE;  Surgeon: Elaina Hoops, MD;  Location: [facility];  Service: Neurosurgery;  Laterality: N/A;  . Brain surgery     Social History:   reports that he has been smoking Cigarettes.  He has a 22.5 pack-year smoking history. He has never used smokeless tobacco. He reports that he drinks alcohol. He reports that he does not use illicit drugs.  Family History  Problem Relation Age of Onset  . Hypertension Mother   . Diabetes Father   . Glaucoma Father   . Diabetes Brother   . Diabetes Brother   . Colon cancer Neg Hx     Medications:   Medication List       This list is accurate as of: 09/25/15  9:47 AM.  Always use your most recent med list.               acetaminophen 325 MG tablet  Commonly known as:  TYLENOL  Take 2 tablets (650 mg total) by mouth every 6 (six) hours as needed for mild pain (or Fever >/= 101).     amitriptyline 25 MG tablet  Commonly known as:  ELAVIL  Take 25 mg by mouth at bedtime.     butalbital-acetaminophen-caffeine 50-325-40-30 MG capsule  Commonly known as:  FIORICET WITH CODEINE  Take 1 capsule by mouth every 4 (four) hours as needed for headache.     calcium carbonate 750 MG chewable tablet  Commonly known as:  TUMS EX  Chew 2 tablets by mouth 2 (two) times daily.     chlorhexidine 0.12 % solution  Commonly known as:  PERIDEX  Rinse with 15 mls three daily for 30 seconds. Use after breakfast, lunch and at bedtime. Spit out excess. Do not swallow.     cyclobenzaprine 10 MG tablet  Commonly known as:  FLEXERIL  Take 1 tablet (10 mg total) by mouth 3 (three) times daily as needed for muscle  spasms.     dexamethasone 6 MG tablet  Commonly known as:  DECADRON  Take 1 tablet (6 mg total) by mouth every 6 (six) hours.     docusate sodium 100 MG capsule  Commonly known as:  COLACE  Take 100 mg by mouth daily.     HYDROcodone-acetaminophen 5-325 MG tablet  Commonly known as:  NORCO/VICODIN  Take 1 tablet by mouth every 4 (four) hours as needed for moderate pain.     LACTOBACILLUS PO  Take 1 capsule by mouth daily.     levothyroxine 50 MCG tablet  Commonly known as:  SYNTHROID, LEVOTHROID  Take 1 tablet (50 mcg total) by mouth daily before breakfast.     lisinopril 40 MG tablet  Commonly known as:  PRINIVIL,ZESTRIL  Take 1 tablet (40 mg total) by mouth daily.     LORazepam 0.5 MG tablet  Commonly known as:  ATIVAN  take 1  tablet by mouth every 8 hours if needed for ANXIETY, NAUSEA, OR SLEEP     multivitamin with minerals tablet  Take 1 tablet by mouth daily.     NEXIUM 24HR 20 MG capsule  Generic drug:  esomeprazole  Take 20 mg by mouth daily.     nystatin 100000 UNIT/ML suspension  Commonly known as:  MYCOSTATIN  Take 5 mLs (500,000 Units total) by mouth 4 (four) times daily.     senna 8.6 MG tablet  Commonly known as:  SENOKOT  Take 1 tablet (8.6 mg total) by mouth daily as needed for constipation.     verapamil 180 MG (CO) 24 hr tablet  Commonly known as:  COVERA HS  Take 1 tablet (180 mg total) by mouth at bedtime.         Physical Exam: Filed Vitals:   09/25/15 0946  BP: 119/70  Pulse: 79  Temp: 97.2 F (36.2 C)  Resp: 16  SpO2: 95%    General- adult male, obese, in no acute distress Head- normocephalic, atraumatic, no temporal tenderness Nose- no maxillary or frontal sinus tenderness, no nasal discharge Throat- moist mucus membrane Eyes- PERRLA, EOMI, no pallor, no icterus, no discharge, normal conjunctiva, normal sclera Neck- no cervical lymphadenopathy Cardiovascular- normal s1,s2, no murmurs, no leg edema Respiratory- bilateral  clear to auscultation, no wheeze, no rhonchi, no crackles, no use of accessory muscles Abdomen- bowel sounds present, soft, non tender Musculoskeletal- able to move all 4 extremities, generalized weakness, palpable distal pulses  Neurological- falling asleep between conversation. alert and oriented Skin- warm and dry Psychiatry- normal mood and affect     Labs reviewed: Basic Metabolic Panel:  Recent Labs  07/04/15 0411 07/07/15 1312 09/19/15 2140 09/20/15 0427  NA 135 138 143 140  K  4.2 4.0 3.7 3.8  CL 104  --  114* 111  CO2 23 22 20* 19*  GLUCOSE 125* 92 114* 115*  BUN 11 14.3 33* 34*  CREATININE 0.77 0.8 0.85 0.74  CALCIUM 9.2 9.8 9.1 8.6*   Liver Function Tests:  Recent Labs  07/04/15 0411 07/07/15 1312 09/20/15 0427  AST 21 25 26   ALT 18 30 55  ALKPHOS 47 62 77  BILITOT 0.8 0.43 0.8  PROT 6.2* 6.1* 5.2*  ALBUMIN 3.0* 3.1* 2.9*   No results for input(s): LIPASE, AMYLASE in the last 8760 hours. No results for input(s): AMMONIA in the last 8760 hours. CBC:  Recent Labs  07/07/15 1312 09/19/15 2140 09/20/15 0427  WBC 11.1* 7.2 6.9  NEUTROABS 8.3* 5.9 5.7  HGB 14.2 10.8* 11.0*  HCT 43.4 32.7* 34.1*  MCV 89.4 88.9 89.7  PLT 224 159 164   Cardiac Enzymes: No results for input(s): CKTOTAL, CKMB, CKMBINDEX, TROPONINI in the last 8760 hours. BNP: Invalid input(s): POCBNP CBG:  Recent Labs  07/04/15 0733  GLUCAP 112*    Radiological Exams: Ct Head Wo Contrast  09/19/2015  CLINICAL DATA:  Glioblastoma.  Rule out acute hemorrhage. EXAM: CT HEAD WITHOUT CONTRAST TECHNIQUE: Contiguous axial images were obtained from the base of the skull through the vertex without intravenous contrast. COMPARISON:  MRI 07/03/2015.  CT 07/02/2015. FINDINGS: Large mass again noted in the posterior right cerebral hemisphere involving the right occipital lobe, parietal lobe, posterior temporal and frontal lobes. Areas of high density within the mass are unchanged and likely  reflect calcifications. No evidence of hemorrhage. The overall mass has extended to now involve and cross the posterior corpus callosum. Increasing right to left midline shift, now all 9 mm. Mass-effect on the right lateral ventricle. No hydrocephalus. IMPRESSION: Enlarging right posterior cerebral hemisphere mass, now crossing the posterior corpus callosum. No evidence for hemorrhage. Increasing right to left midline shift, now 9 mm. Electronically Signed   By: Rolm Baptise M.D.   On: 09/19/2015 18:56   09/21/15 MRI brain IMPRESSION: Significant worsening of mass like T2 hyperintensity in the right hemisphere, with new extensive involvement of the splenium of the corpus callosum which tracks across midline to the left. Subsequent increased intracranial mass effect with leftward midline shift now 8-9 mm (4 mm in October). 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 However, if the patient has undergone interval vaccination/immune therapy at [facility] then there is a chance that these changes since October are treatment effect.  No new intracranial abnormality    Assessment/Plan  Physical deconditioning Will have him work with physical therapy and occupational therapy team to help with gait training and muscle strengthening exercises.fall precautions. Skin care. Encourage to be out of bed.   Glioblastoma multiforme Recurrent glioma with progression of disease and midline shift noted on brain imaging. Currently on clinical trial with intratumoral infusion of D2C7. Has intermittent headache. Continue fioricet and prn norco for pain. Continue decadron 6 mg qid. Continue amitriptyline 12.5 mg qhs. Make appointment with Dr Burr Medico in 1 week. Plan is to start Avastin per oncology note.  Headache Continue amitriptyline daily scheduled and prn fioricet for now  Protein  calorie malnutrition Get dietary consult, encourage po intake. Continue certavite.continue oral hygiene  gerd Stable, continue nexium current regimen  Hypothyroidism Continue levothyroxine 50 mcg daily.  Lab Results  Component Value Date  TSH 0.540 07/04/2015   HTN Stable. Continue lisinopril 40 mg daily, verapamil 180 mg daily, monitor BP reading. monitor cmp  GAD Continue lorazepam 0.5 mg 1 tab q8h prn and monitor  Constipation Continue docusate 100 mg daily and senokot daily, hydration to be maintained  Muscle spasm Continue flexeril 10 mg tid prn   Goals of care: short term rehabilitation   Labs/tests ordered: cbc with diff, cmp in am  Family/ staff Communication: reviewed care plan with patient and nursing supervisor    Blanchie Serve, MD  [place] [phone] (Monday-Friday 8 am - 5 pm) [phone] (afterhours)

## 2015-09-25 NOTE — Progress Notes (Signed)
This encounter was created in error - please disregard.

## 2015-09-25 NOTE — Telephone Encounter (Signed)
Called pt's wife to inform her of the new appts  to be set  on Wednesday 09/27/15 for labs, visit with NP and tx for her husband.Transportation from [employer] will be bringing pt to appt.

## 2015-09-25 NOTE — Telephone Encounter (Signed)
Called to inquire  and make sure pt knew about appt today. No answer but left a detailed message for pt to call this nurse back @ [phone] and and to speak to me. Pt has not shown for appt as of yet. Message to be fwd to H. Boelter,NP.

## 2015-09-26 ENCOUNTER — Telehealth: Payer: Self-pay | Admitting: Nurse Practitioner

## 2015-09-26 LAB — BASIC METABOLIC PANEL
BUN: 35 mg/dL — AB (ref 4–21)
Creatinine: 0.7 mg/dL (ref 0.6–1.3)
Glucose: 74 mg/dL
Potassium: 3.1 mmol/L — AB (ref 3.4–5.3)
SODIUM: 144 mmol/L (ref 137–147)

## 2015-09-26 LAB — CBC AND DIFFERENTIAL
HEMATOCRIT: 32 % — AB (ref 41–53)
Hemoglobin: 10.2 g/dL — AB (ref 13.5–17.5)
PLATELETS: 161 10*3/uL (ref 150–399)
WBC: 6.1 10^3/mL

## 2015-09-26 LAB — HEPATIC FUNCTION PANEL
ALK PHOS: 75 U/L (ref 25–125)
ALT: 44 U/L — AB (ref 10–40)
AST: 27 U/L (ref 14–40)
BILIRUBIN, TOTAL: 0.4 mg/dL

## 2015-09-26 NOTE — Telephone Encounter (Signed)
perp of to sch pt appt-cld pt and gave pt time * date of appt for 1/125

## 2015-09-27 ENCOUNTER — Other Ambulatory Visit (HOSPITAL_BASED_OUTPATIENT_CLINIC_OR_DEPARTMENT_OTHER): Payer: BLUE CROSS/BLUE SHIELD

## 2015-09-27 ENCOUNTER — Ambulatory Visit (HOSPITAL_BASED_OUTPATIENT_CLINIC_OR_DEPARTMENT_OTHER): Payer: BLUE CROSS/BLUE SHIELD

## 2015-09-27 ENCOUNTER — Telehealth: Payer: Self-pay | Admitting: Hematology and Oncology

## 2015-09-27 ENCOUNTER — Ambulatory Visit (HOSPITAL_BASED_OUTPATIENT_CLINIC_OR_DEPARTMENT_OTHER): Payer: BLUE CROSS/BLUE SHIELD | Admitting: Nurse Practitioner

## 2015-09-27 ENCOUNTER — Encounter: Payer: Self-pay | Admitting: Nurse Practitioner

## 2015-09-27 VITALS — BP 123/87 | HR 90 | Resp 18 | Ht 71.0 in

## 2015-09-27 DIAGNOSIS — G709 Myoneural disorder, unspecified: Secondary | ICD-10-CM

## 2015-09-27 DIAGNOSIS — R51 Headache: Secondary | ICD-10-CM | POA: Diagnosis not present

## 2015-09-27 DIAGNOSIS — C714 Malignant neoplasm of occipital lobe: Secondary | ICD-10-CM

## 2015-09-27 DIAGNOSIS — R519 Headache, unspecified: Secondary | ICD-10-CM

## 2015-09-27 DIAGNOSIS — R29898 Other symptoms and signs involving the musculoskeletal system: Secondary | ICD-10-CM

## 2015-09-27 DIAGNOSIS — C719 Malignant neoplasm of brain, unspecified: Secondary | ICD-10-CM

## 2015-09-27 DIAGNOSIS — Z5112 Encounter for antineoplastic immunotherapy: Secondary | ICD-10-CM | POA: Diagnosis not present

## 2015-09-27 DIAGNOSIS — C713 Malignant neoplasm of parietal lobe: Secondary | ICD-10-CM

## 2015-09-27 DIAGNOSIS — Z720 Tobacco use: Secondary | ICD-10-CM

## 2015-09-27 LAB — COMPREHENSIVE METABOLIC PANEL
ALBUMIN: 3.2 g/dL — AB (ref 3.5–5.0)
ALK PHOS: 96 U/L (ref 40–150)
ALT: 59 U/L — ABNORMAL HIGH (ref 0–55)
ANION GAP: 9 meq/L (ref 3–11)
AST: 33 U/L (ref 5–34)
BILIRUBIN TOTAL: 0.56 mg/dL (ref 0.20–1.20)
BUN: 33.1 mg/dL — ABNORMAL HIGH (ref 7.0–26.0)
CALCIUM: 9.2 mg/dL (ref 8.4–10.4)
CO2: 24 mEq/L (ref 22–29)
Chloride: 111 mEq/L — ABNORMAL HIGH (ref 98–109)
Creatinine: 0.8 mg/dL (ref 0.7–1.3)
Glucose: 106 mg/dl (ref 70–140)
POTASSIUM: 3.5 meq/L (ref 3.5–5.1)
SODIUM: 143 meq/L (ref 136–145)
TOTAL PROTEIN: 6.2 g/dL — AB (ref 6.4–8.3)

## 2015-09-27 LAB — CBC WITH DIFFERENTIAL/PLATELET
BASO%: 0.2 % (ref 0.0–2.0)
BASOS ABS: 0 10*3/uL (ref 0.0–0.1)
EOS%: 0 % (ref 0.0–7.0)
Eosinophils Absolute: 0 10*3/uL (ref 0.0–0.5)
HEMATOCRIT: 38.7 % (ref 38.4–49.9)
HEMOGLOBIN: 12.8 g/dL — AB (ref 13.0–17.1)
LYMPH%: 8.7 % — ABNORMAL LOW (ref 14.0–49.0)
MCH: 29.4 pg (ref 27.2–33.4)
MCHC: 33.1 g/dL (ref 32.0–36.0)
MCV: 89 fL (ref 79.3–98.0)
MONO#: 0.2 10*3/uL (ref 0.1–0.9)
MONO%: 2.3 % (ref 0.0–14.0)
NEUT#: 7.3 10*3/uL — ABNORMAL HIGH (ref 1.5–6.5)
NEUT%: 88.8 % — ABNORMAL HIGH (ref 39.0–75.0)
PLATELETS: 148 10*3/uL (ref 140–400)
RBC: 4.35 10*6/uL (ref 4.20–5.82)
RDW: 19.9 % — AB (ref 11.0–14.6)
WBC: 8.3 10*3/uL (ref 4.0–10.3)
lymph#: 0.7 10*3/uL — ABNORMAL LOW (ref 0.9–3.3)

## 2015-09-27 LAB — TECHNOLOGIST REVIEW

## 2015-09-27 MED ORDER — HYDROCODONE-ACETAMINOPHEN 5-325 MG PO TABS
1.0000 | ORAL_TABLET | Freq: Once | ORAL | Status: AC
  Administered 2015-09-27: 1 via ORAL

## 2015-09-27 MED ORDER — SODIUM CHLORIDE 0.9 % IV SOLN
7.5000 mg/kg | Freq: Once | INTRAVENOUS | Status: AC
  Administered 2015-09-27: 700 mg via INTRAVENOUS
  Filled 2015-09-27: qty 16

## 2015-09-27 MED ORDER — HYDROCODONE-ACETAMINOPHEN 5-325 MG PO TABS
ORAL_TABLET | ORAL | Status: AC
  Filled 2015-09-27: qty 1

## 2015-09-27 MED ORDER — DEXAMETHASONE 4 MG PO TABS: 4.0000 mg | ORAL_TABLET | Freq: Three times a day (TID) | ORAL | Status: DC

## 2015-09-27 MED ORDER — SODIUM CHLORIDE 0.9 % IV SOLN
Freq: Once | INTRAVENOUS | Status: AC
  Administered 2015-09-27: 13:00:00 via INTRAVENOUS

## 2015-09-27 NOTE — Patient Instructions (Signed)
[facility] Discharge Instructions for Patients Receiving Chemotherapy  Today you received the following chemotherapy agents avastin   If you develop nausea and vomiting that is not controlled by your nausea medication, call the clinic.   BELOW ARE SYMPTOMS THAT SHOULD BE REPORTED IMMEDIATELY:  *FEVER GREATER THAN 100.5 F  *CHILLS WITH OR WITHOUT FEVER  NAUSEA AND VOMITING THAT IS NOT CONTROLLED WITH YOUR NAUSEA MEDICATION  *UNUSUAL SHORTNESS OF BREATH  *UNUSUAL BRUISING OR BLEEDING  TENDERNESS IN MOUTH AND THROAT WITH OR WITHOUT PRESENCE OF ULCERS  *URINARY PROBLEMS  *BOWEL PROBLEMS  UNUSUAL RASH Items with * indicate a potential emergency and should be followed up as soon as possible.  Feel free to call the clinic you have any questions or concerns. The clinic phone number is [phone].  Please show the [place] at check-in to the Emergency Department and triage nurse.

## 2015-09-27 NOTE — Telephone Encounter (Signed)
appiontments made and avs printed for patient

## 2015-09-27 NOTE — Progress Notes (Signed)
OK to treat with urine protein from 08/17/16 from [name] per Dr. Burr Medico.

## 2015-09-27 NOTE — Progress Notes (Signed)
[facility] OFFICE PROGRESS NOTE  DIAGNOSIS:    Glioblastoma of occipital lobe ([facility]) (Resolved)   07/02/2014 Imaging Brain MRI: Two adjacent enhancing mass lesions in the right parietal lobe with central necrosis and extensive surrounding edema and mass effect.  7 mm midline shift to the left.  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   CT CAP(-)   07/07/2014 Surgery Subtotal right parietal tumors resection    08/03/2014 Concurrent Chemotherapy Concurrent radiation with Temodar 75 mg/m daily, completed on 09/15/2014.    09/29/2014 - 12/28/2014 Chemotherapy Adjuvant Temodar, 150-200mg /m2 for 5 days, every 28 day cycle, s/p 3 cycles, stopped due to disease progression.   10/14/2014 Imaging Two discrete foci of enhancement in the RIGHT occipital lobe surround the larger resection cavity, and are suspected to represent foci of residual tumor/GBM displaying interval growt   12/13/2014 Imaging Increased size/ enhancement of 2 lesions adjacent to the right occipital resection cavity, concerning for growth of residual tumor.   01/06/2015 -  Chemotherapy Bevacizumab 10mg /kg every 2 weeks   04/18/2015 Progression Brain MRI showed progressed masslike signal abnormality in the right occipital lobe, most compatible with interval to progression of disease.   04/28/2015 - 05/26/2015 Chemotherapy Irinotecan 1 25 mg/m, every 2 weeks, was added on to Avastin due to disease progression. Stopped due to disease progression.    07/03/2015 - 07/04/2015 Hospital Admission He was admitted for worsening headaches and fever. ID workup was negative. His dexamethasone was increased, and his headaches are much improved. He was discharged home Today.   08/22/2015 Procedure Admission to [name] for AIRO guided right tempora sterostatic brain biopsy with placement of two therapeutic catheters. The frozen pathology showed recurrent malignant glioma. Infusion of D2C7 vaccine was started on 08/22/15. Catheters  removed 12/26   09/19/2015 - 09/22/2015 Hospital Admission Intractable headache with worsening brain mass with midline shift. Discharged with steroids to rehab facility.     GBM (glioblastoma multiforme) ([place])   07/22/2014 Initial Diagnosis GBM (glioblastoma multiforme)    CURRENT THERAPY:  Dexamethasone 6mg  q6h   INTERVAL HISTORY:  Jake [name] returns for follow up of his glioblastoma. He was discharged from the hospital on 09/22/15 after a 3 day admission for intractable headaches. He was put on dexamethasone 6mg  every 6 hours and this has tremendously improved his headaches. He is residing at [facility] for rehabilitation. This was going well at first, but he finds that he is very weak now and has lost all progress he originally made with physical therapy since his hospitalization. He is very weak, and cannot stand on his own. He has fallen at least twice out of bed, and does not remember getting up.  REVIEW OF SYSTEMS: He denies fevers, chills, nausea or vomiting. He is continent of stool and urine now. He is on stool softeners to avoid constipation. He is occasionally light headed and dizzy. His vision is blurred. His appetite is fair. He is short of breath with exertion, but denies chest pain, cough, or palpitations. He is irritable and greatly desires to smoke, despite having a cigarette just minutes earlier. His memory is poor. He has pain to his sacrum, when he landed after his most recent fall. The skin is not broken. He uses ativan to help him sleep. He has norco PRN for pain.  PHYSICAL EXAM: Vital signs in last 24 hours:  Blood pressure 123/87, pulse 90, resp. rate 18, height 5\' 11"  (1.803 m), SpO2 98 %.   Skin: warm, dry,  intact, no rash HEENT: sclerae anicteric, conjunctivae pink, oropharynx clear. No thrush or mucositis.  Lymph Nodes: No cervical or supraclavicular lymphadenopathy  Lungs: clear to auscultation bilaterally, no rales, wheezes, or rhonci  Heart: regular rate and  rhythm  Abdomen: obese, soft, non tender, positive bowel sounds  Musculoskeletal: No focal spinal tenderness, +2 edema bilateral lower legs Neuro: well oriented, speech fluent, left visual field deficits, +4 strength in arms and legs  MEDICATIONS:  Current Outpatient Prescriptions on File Prior to Visit  Medication Sig Dispense Refill  . amitriptyline (ELAVIL) 25 MG tablet Take 25 mg by mouth at bedtime.     . butalbital-acetaminophen-caffeine (FIORICET WITH CODEINE) 50-325-40-30 MG capsule Take 1 capsule by mouth every 4 (four) hours as needed for headache.    . cyclobenzaprine (FLEXERIL) 10 MG tablet Take 1 tablet (10 mg total) by mouth 3 (three) times daily as needed for muscle spasms. 30 tablet 0  . docusate sodium (COLACE) 100 MG capsule Take 100 mg by mouth daily.     Marland Kitchen HYDROcodone-acetaminophen (NORCO/VICODIN) 5-325 MG tablet Take 1 tablet by mouth every 4 (four) hours as needed for moderate pain. Do not exceed 4gm of Tylenol in 24 hours 180 tablet 0  . LACTOBACILLUS PO Take 1 capsule by mouth daily.    Marland Kitchen levothyroxine (SYNTHROID, LEVOTHROID) 50 MCG tablet Take 1 tablet (50 mcg total) by mouth daily before breakfast. 90 tablet 3  . lisinopril (PRINIVIL,ZESTRIL) 40 MG tablet Take 1 tablet (40 mg total) by mouth daily. 90 tablet 3  . LORazepam (ATIVAN) 0.5 MG tablet take 1  tablet by mouth every 8 hours if needed for ANXIETY, NAUSEA, OR SLEEP 90 tablet 0  . Multiple Vitamins-Minerals (MULTIVITAMIN WITH MINERALS) tablet Take 1 tablet by mouth daily.    Marland Kitchen NEXIUM 24HR 20 MG capsule Take 20 mg by mouth daily.   0  . nystatin (MYCOSTATIN) 100000 UNIT/ML suspension Take 5 mLs (500,000 Units total) by mouth 4 (four) times daily. 473 mL 1  . senna (SENOKOT) 8.6 MG tablet Take 1 tablet (8.6 mg total) by mouth daily as needed for constipation.    . verapamil (COVERA HS) 180 MG (CO) 24 hr tablet Take 1 tablet (180 mg total) by mouth at bedtime. 90 tablet 3  . acetaminophen (TYLENOL) 325 MG tablet  Take 2 tablets (650 mg total) by mouth every 6 (six) hours as needed for mild pain (or Fever >/= 101). (Patient not taking: Reported on 09/19/2015) 30 tablet 0  . calcium carbonate (TUMS EX) 750 MG chewable tablet Chew 2 tablets by mouth 2 (two) times daily. Reported on 09/27/2015    . chlorhexidine (PERIDEX) 0.12 % solution Rinse with 15 mls three daily for 30 seconds. Use after breakfast, lunch and at bedtime. Spit out excess. Do not swallow. (Patient not taking: Reported on 09/27/2015) 1440 mL prn   No current facility-administered medications on file prior to visit.    LAB RESULTS:  Lab Results  Component Value Date   WBC 8.3 09/27/2015   HGB 12.8* 09/27/2015   HCT 38.7 09/27/2015   MCV 89.0 09/27/2015   PLT 148 09/27/2015   NEUTROABS 7.3* 09/27/2015   CMP     Component Value Date/Time   NA 143 09/27/2015 1004   NA 140 09/20/2015 0427   K 3.5 09/27/2015 1004   K 3.8 09/20/2015 0427   CL 111 09/20/2015 0427   CO2 24 09/27/2015 1004   CO2 19* 09/20/2015 0427   GLUCOSE 106 09/27/2015 1004  GLUCOSE 115* 09/20/2015 0427   BUN 33.1* 09/27/2015 1004   BUN 34* 09/20/2015 0427   CREATININE 0.8 09/27/2015 1004   CREATININE 0.74 09/20/2015 0427   CREATININE 0.86 01/25/2014 1015   CALCIUM 9.2 09/27/2015 1004   CALCIUM 8.6* 09/20/2015 0427   PROT 6.2* 09/27/2015 1004   PROT 5.2* 09/20/2015 0427   ALBUMIN 3.2* 09/27/2015 1004   ALBUMIN 2.9* 09/20/2015 0427   AST 33 09/27/2015 1004   AST 26 09/20/2015 0427   ALT 59* 09/27/2015 1004   ALT 55 09/20/2015 0427   ALKPHOS 96 09/27/2015 1004   ALKPHOS 77 09/20/2015 0427   BILITOT 0.56 09/27/2015 1004   BILITOT 0.8 09/20/2015 0427   GFRNONAA >60 09/20/2015 0427   GFRAA >60 09/20/2015 0427    IMAGING:  Ct Head Wo Contrast  09/19/2015  CLINICAL DATA:  Glioblastoma.  Rule out acute hemorrhage. EXAM: CT HEAD WITHOUT CONTRAST TECHNIQUE: Contiguous axial images were obtained from the base of the skull through the vertex without  intravenous contrast. COMPARISON:  MRI 07/03/2015.  CT 07/02/2015. FINDINGS: Large mass again noted in the posterior right cerebral hemisphere involving the right occipital lobe, parietal lobe, posterior temporal and frontal lobes. Areas of high density within the mass are unchanged and likely reflect calcifications. No evidence of hemorrhage. The overall mass has extended to now involve and cross the posterior corpus callosum. Increasing right to left midline shift, now all 9 mm. Mass-effect on the right lateral ventricle. No hydrocephalus. IMPRESSION: Enlarging right posterior cerebral hemisphere mass, now crossing the posterior corpus callosum. No evidence for hemorrhage. Increasing right to left midline shift, now 9 mm. Electronically Signed   By: Rolm Baptise M.D.   On: 09/19/2015 18:56   Mr [name] [id number] Contrast  09/21/2015  CLINICAL DATA:  61 year old male with recurrent glioblastoma enrolled in clinical vaccine trial at [facility] last month. Recently developed severe headache requiring increase pain medication. Subsequent encounter. EXAM: MRI HEAD WITHOUT AND WITH CONTRAST TECHNIQUE: Multiplanar, multiecho pulse sequences of the brain and surrounding structures were obtained without and with intravenous contrast. CONTRAST:  56mL MULTIHANCE GADOBENATE DIMEGLUMINE 529 MG/ML IV SOLN COMPARISON:  Head CT without contrast 09/19/2015. Restaging Brain MRI 07/03/2015. FINDINGS: Significant interval worsening of abnormal masslike T2 hyperintensity in the brain, now extensively affecting the splenium of the corpus callosum and tracking across midline (series 6, image 18), extending into the right deep gray matter nuclei (image 17) and farther anteriorly into the right temporal lobe (image 15). Associated diffusion signal abnormality also has significantly increased, with a confluent rim of restricted diffusion adjacent to the right atrium (series 400, image 31). Also, there is abnormal restricted diffusion in the splenium  which is new (series 4, image 30). Subsequent increased leftward midline shift of 8-9 mm (4 mm previously). There might be early trapping of the left lateral ventricle, where the temporal and occipital horns are mildly increased (series 7, image 14). Following contrast there is less confluent enhancement in the right occipital pole (series 11, image 24), but increased stellate enhancement about the atrium of the right lateral ventricle (image 29). The lesion only crosses midline at the splenium, and the advancing edge of abnormal T2 hyperintensity and T1 hypo intensity does not enhance. Chronic blood products in the right occipital lobe are stable. Overlying post craniotomy changes. Basilar cisterns remain patent. Brainstem, cerebellum, cervicomedullary junction, and visualized cervical spine remain normal. Major intracranial vascular flow voids are stable. No diffusion abnormality to suggest acute infarction. No acute intracranial hemorrhage  identified. Bone marrow signal remains normal. Mastoids and paranasal sinuses remain clear. Stable orbit and scalp soft tissues. IMPRESSION: Significant worsening of mass like T2 hyperintensity in the right hemisphere, with new extensive involvement of the splenium of the corpus callosum which tracks across midline to the left. Subsequent increased intracranial mass effect with leftward midline shift now 8-9 mm (4 mm in October). Suspected early trapping of the left lateral ventricle. True progression of disease was strongly favored in October considering the patient had not yet begun new therapy at that time. However, if the patient has undergone interval vaccination/immune therapy at [facility] then there is a chance that these changes since October are treatment effect. No new intracranial abnormality. Electronically Signed   By: Genevie Ann M.D.   On: 09/21/2015 13:45    ASSESSMENT: Jake [name] is a 59 year old gentleman with right parietal GBM, status post subtotal resection.    1. GBM, s/p subtotal resection, and concurrent chemoRT completed on 09/15/2014, progressed on adjuvant Temodar, s/p infusion of D2C7 vaccine at [facility] on 12/20 -Dr. Burr Medico reviewed the results of the patient's most recent brain MRI. Per Dr. Donnella Sham review, the edema is only minimally increased when compared to the last brain MRI performed at [facility]. For the edema, we will begin single agent avastin today, increasing the dose to 7.5mg /kg, but decreasing the frequency to every 3 weeks.  -Patient will continue on clinical trial at [facility]. To follow up with this facility within next 2 weeks  2. Headaches -improving, taper dexamethasone down to 4mg  TID -fioricet PRN has been helpful, continue PRN  3. Lower extremity weakness -secondary to heavy steroid use and narcotics  -reinforced safety behaviors, calling for help before getting out of bed -will continue to work with physical therapy.  4. Tobacco abuse -urges likely enhanced by anxiety caused by steroid use -encouraged to minimize smoking -declined nicotine patches   PLAN:  -Proceed with single agent avastin today -taper dexamethasone to 4mg  TID -RTC on 2/3 for follow up with Dr. Burr Medico to discuss plan and continued steroid taper if possible  The patient understands and agrees with this plan. All questions were answered. He was encouraged to call with any issues that might arise before his next visit here.   Total time spent in appointment was 40 minutes, with greater than 50% of the time spent face to face with the patient.  Laurie Panda, NP 09/27/2015  4:49 PM

## 2015-09-29 ENCOUNTER — Telehealth: Payer: Self-pay | Admitting: *Deleted

## 2015-09-29 ENCOUNTER — Other Ambulatory Visit: Payer: Self-pay | Admitting: *Deleted

## 2015-09-29 NOTE — Telephone Encounter (Signed)
Thu,  Please fax urine order to his SNF, and let his wife know that his 2/3 appointments. I agree with the rest.  Thanks,  Truitt Merle

## 2015-09-29 NOTE — Telephone Encounter (Signed)
Faxed order letter to [facility]: Janett Billow for pt's appts and order for urine testing prior to Avastin on 10/18/15. [facility]  Fax     [phone].

## 2015-09-29 NOTE — Telephone Encounter (Signed)
TC from patient's wife with several requests:  1. Pt is at a nursing facility-[facility] and the patient's nurses have said they could do the urine sample for urine protein there in anticipation of his treatment with Avastin. They will need an order. Phone # to [facility] is [phone].  2. Pt's wife wants to know when next chemo therapy treatment will be so transportation can be arranged. Transportation needs at least 24 hours notice-usually more than that.  3. Does pt still need lab and MD appt on 10/06/15?  4. Wife states that patient's left leg is swollen and wants to know if it is a blood clot. Advised wife to address this issue with staff @ [facility] so it can be evaluated there as we cannot diagnose over the phone. Wife agreeable to this.

## 2015-10-02 ENCOUNTER — Telehealth: Payer: Self-pay | Admitting: *Deleted

## 2015-10-02 ENCOUNTER — Other Ambulatory Visit: Payer: Self-pay | Admitting: Hematology

## 2015-10-02 ENCOUNTER — Encounter: Payer: Self-pay | Admitting: Hematology

## 2015-10-02 NOTE — Progress Notes (Signed)
placed in my box--disability form

## 2015-10-02 NOTE — Telephone Encounter (Signed)
"  This is wife [name] Youtsey.  I need a written order for the nursing facility/home with what urine lab test Dr. Burr Medico needs them to do. Also need an appointment for this week because he did not receive Avastin last week.  [name] wants him to receive this to reduce swelling and mid-line shift. He has to have this.  I will be coming by today with a form and hope to get the order and appointment.  Return number [phone]."

## 2015-10-02 NOTE — Telephone Encounter (Signed)
I met with Jake [name] when she came to my office. I reassured her that pt received Avastin on 09/27/15, and next due on 2/15. I gave him written order for UA.   Truitt Merle  10/02/2015

## 2015-10-03 ENCOUNTER — Encounter: Payer: Self-pay | Admitting: Hematology

## 2015-10-03 NOTE — Progress Notes (Signed)
I placed for dr Burr Medico to sign disab form-[employer] noted

## 2015-10-03 NOTE — Progress Notes (Signed)
Called to let wife know forms are ready. [phone]- left message.  [employer] forms.

## 2015-10-04 ENCOUNTER — Telehealth: Payer: Self-pay

## 2015-10-04 ENCOUNTER — Encounter: Payer: Self-pay | Admitting: General Practice

## 2015-10-04 LAB — BASIC METABOLIC PANEL
BUN: 29 mg/dL — AB (ref 4–21)
CREATININE: 0.6 mg/dL (ref 0.6–1.3)
GLUCOSE: 82 mg/dL
POTASSIUM: 3.1 mmol/L — AB (ref 3.4–5.3)
Sodium: 145 mmol/L (ref 137–147)

## 2015-10-04 NOTE — Telephone Encounter (Signed)
Wife called. Pt is at [facility]. She has a special multivitamin she wants nurses to give to her husband.. It is called [employer]. Wife will provide it to [facility]. If Dr Burr Medico will write a prescription for him to receive this, his wife will pick it up and take to [facility]. Wife does not trust their fax.  In his refrigerator he has a shake called [employer]gain, If Dr Burr Medico can also write an order that he is provided this with his meals and put back in his fridge so it will not spoil. Wife will provide this to [facility] as well.   Call when the rx is ready.

## 2015-10-04 NOTE — Progress Notes (Signed)
Spiritual Care Note  Following from a distance while pt, who goes by Jake [name], is at [facility].  Left VM reminder about (brain cancer/caregiver) support group tomorrow for wife [name], as she has attended consistently with Jake [name].  [place], [place], [facility] Pager [phone] Voicemail  [phone]

## 2015-10-05 NOTE — Telephone Encounter (Signed)
My nurse Janifer Adie will contact his SNF to see if they need prescription. I am OK to him to take those supplements.  Jake [name]  10/05/2015

## 2015-10-06 ENCOUNTER — Ambulatory Visit (HOSPITAL_BASED_OUTPATIENT_CLINIC_OR_DEPARTMENT_OTHER): Payer: BLUE CROSS/BLUE SHIELD | Admitting: Hematology

## 2015-10-06 ENCOUNTER — Other Ambulatory Visit: Payer: Self-pay

## 2015-10-06 ENCOUNTER — Encounter (HOSPITAL_COMMUNITY): Payer: Self-pay

## 2015-10-06 ENCOUNTER — Inpatient Hospital Stay (HOSPITAL_COMMUNITY): Payer: 59

## 2015-10-06 ENCOUNTER — Inpatient Hospital Stay (HOSPITAL_COMMUNITY)
Admission: AD | Admit: 2015-10-06 | Discharge: 2015-10-10 | DRG: 193 | Disposition: A | Discharge status: Skilled Nursing Facility | Payer: 59 | Source: Ambulatory Visit | Attending: Internal Medicine | Admitting: Internal Medicine

## 2015-10-06 ENCOUNTER — Other Ambulatory Visit (HOSPITAL_BASED_OUTPATIENT_CLINIC_OR_DEPARTMENT_OTHER): Payer: BLUE CROSS/BLUE SHIELD

## 2015-10-06 ENCOUNTER — Telehealth: Payer: Self-pay | Admitting: *Deleted

## 2015-10-06 ENCOUNTER — Encounter: Payer: Self-pay | Admitting: Hematology

## 2015-10-06 VITALS — BP 85/71 | HR 116 | Temp 98.0°F | Resp 23

## 2015-10-06 DIAGNOSIS — R51 Headache: Secondary | ICD-10-CM

## 2015-10-06 DIAGNOSIS — C713 Malignant neoplasm of parietal lobe: Secondary | ICD-10-CM

## 2015-10-06 DIAGNOSIS — R519 Headache, unspecified: Secondary | ICD-10-CM | POA: Diagnosis present

## 2015-10-06 DIAGNOSIS — G939 Disorder of brain, unspecified: Secondary | ICD-10-CM | POA: Diagnosis not present

## 2015-10-06 DIAGNOSIS — E860 Dehydration: Secondary | ICD-10-CM | POA: Diagnosis present

## 2015-10-06 DIAGNOSIS — G4700 Insomnia, unspecified: Secondary | ICD-10-CM | POA: Diagnosis present

## 2015-10-06 DIAGNOSIS — C714 Malignant neoplasm of occipital lobe: Secondary | ICD-10-CM

## 2015-10-06 DIAGNOSIS — A419 Sepsis, unspecified organism: Secondary | ICD-10-CM

## 2015-10-06 DIAGNOSIS — G9341 Metabolic encephalopathy: Secondary | ICD-10-CM | POA: Diagnosis present

## 2015-10-06 DIAGNOSIS — Z515 Encounter for palliative care: Secondary | ICD-10-CM | POA: Diagnosis present

## 2015-10-06 DIAGNOSIS — G9340 Encephalopathy, unspecified: Secondary | ICD-10-CM | POA: Diagnosis present

## 2015-10-06 DIAGNOSIS — R569 Unspecified convulsions: Secondary | ICD-10-CM | POA: Diagnosis present

## 2015-10-06 DIAGNOSIS — Z8249 Family history of ischemic heart disease and other diseases of the circulatory system: Secondary | ICD-10-CM | POA: Diagnosis not present

## 2015-10-06 DIAGNOSIS — C719 Malignant neoplasm of brain, unspecified: Secondary | ICD-10-CM | POA: Diagnosis present

## 2015-10-06 DIAGNOSIS — G936 Cerebral edema: Secondary | ICD-10-CM | POA: Diagnosis present

## 2015-10-06 DIAGNOSIS — Y95 Nosocomial condition: Secondary | ICD-10-CM | POA: Diagnosis present

## 2015-10-06 DIAGNOSIS — F419 Anxiety disorder, unspecified: Secondary | ICD-10-CM | POA: Diagnosis present

## 2015-10-06 DIAGNOSIS — M1990 Unspecified osteoarthritis, unspecified site: Secondary | ICD-10-CM | POA: Diagnosis present

## 2015-10-06 DIAGNOSIS — R630 Anorexia: Secondary | ICD-10-CM | POA: Diagnosis not present

## 2015-10-06 DIAGNOSIS — Z833 Family history of diabetes mellitus: Secondary | ICD-10-CM | POA: Diagnosis not present

## 2015-10-06 DIAGNOSIS — G935 Compression of brain: Secondary | ICD-10-CM | POA: Diagnosis present

## 2015-10-06 DIAGNOSIS — Z83511 Family history of glaucoma: Secondary | ICD-10-CM | POA: Diagnosis not present

## 2015-10-06 DIAGNOSIS — E039 Hypothyroidism, unspecified: Secondary | ICD-10-CM | POA: Diagnosis present

## 2015-10-06 DIAGNOSIS — G4730 Sleep apnea, unspecified: Secondary | ICD-10-CM | POA: Diagnosis present

## 2015-10-06 DIAGNOSIS — J189 Pneumonia, unspecified organism: Secondary | ICD-10-CM | POA: Diagnosis present

## 2015-10-06 DIAGNOSIS — Z888 Allergy status to other drugs, medicaments and biological substances status: Secondary | ICD-10-CM

## 2015-10-06 DIAGNOSIS — I10 Essential (primary) hypertension: Secondary | ICD-10-CM | POA: Diagnosis present

## 2015-10-06 DIAGNOSIS — Z66 Do not resuscitate: Secondary | ICD-10-CM | POA: Diagnosis present

## 2015-10-06 DIAGNOSIS — R339 Retention of urine, unspecified: Secondary | ICD-10-CM | POA: Diagnosis present

## 2015-10-06 DIAGNOSIS — R5383 Other fatigue: Secondary | ICD-10-CM

## 2015-10-06 DIAGNOSIS — R531 Weakness: Secondary | ICD-10-CM | POA: Diagnosis present

## 2015-10-06 DIAGNOSIS — R4182 Altered mental status, unspecified: Secondary | ICD-10-CM

## 2015-10-06 DIAGNOSIS — Z7952 Long term (current) use of systemic steroids: Secondary | ICD-10-CM | POA: Diagnosis not present

## 2015-10-06 DIAGNOSIS — F17210 Nicotine dependence, cigarettes, uncomplicated: Secondary | ICD-10-CM | POA: Diagnosis present

## 2015-10-06 DIAGNOSIS — Z7189 Other specified counseling: Secondary | ICD-10-CM | POA: Diagnosis not present

## 2015-10-06 DIAGNOSIS — Z79899 Other long term (current) drug therapy: Secondary | ICD-10-CM | POA: Diagnosis not present

## 2015-10-06 DIAGNOSIS — R2990 Unspecified symptoms and signs involving the nervous system: Secondary | ICD-10-CM

## 2015-10-06 LAB — COMPREHENSIVE METABOLIC PANEL
ALBUMIN: 2.9 g/dL — AB (ref 3.5–5.0)
ALK PHOS: 151 U/L — AB (ref 40–150)
ALK PHOS: 137 U/L — AB (ref 38–126)
ALT: 104 U/L — ABNORMAL HIGH (ref 0–55)
ALT: 107 U/L — ABNORMAL HIGH (ref 17–63)
ANION GAP: 13 meq/L — AB (ref 3–11)
ANION GAP: 9 (ref 5–15)
AST: 77 U/L — ABNORMAL HIGH (ref 5–34)
AST: 87 U/L — ABNORMAL HIGH (ref 15–41)
Albumin: 2.8 g/dL — ABNORMAL LOW (ref 3.5–5.0)
BUN: 26.6 mg/dL — ABNORMAL HIGH (ref 7.0–26.0)
BUN: 24 mg/dL — ABNORMAL HIGH (ref 6–20)
CALCIUM: 9.2 mg/dL (ref 8.4–10.4)
CALCIUM: 8.5 mg/dL — AB (ref 8.9–10.3)
CO2: 20 mEq/L — ABNORMAL LOW (ref 22–29)
CO2: 22 mmol/L (ref 22–32)
Chloride: 111 mEq/L — ABNORMAL HIGH (ref 98–109)
Chloride: 110 mmol/L (ref 101–111)
Creatinine, Ser: 0.49 mg/dL — ABNORMAL LOW (ref 0.61–1.24)
Creatinine: 0.6 mg/dL — ABNORMAL LOW (ref 0.7–1.3)
GFR calc non Af Amer: >60 mL/min (ref >60)
Glucose, Bld: 162 mg/dL — ABNORMAL HIGH (ref 65–99)
Glucose: 139 mg/dl (ref 70–140)
POTASSIUM: 3.6 meq/L (ref 3.5–5.1)
Potassium: 3.5 mmol/L (ref 3.5–5.1)
SODIUM: 144 meq/L (ref 136–145)
SODIUM: 141 mmol/L (ref 135–145)
TOTAL PROTEIN: 5.8 g/dL — AB (ref 6.5–8.1)
Total Bilirubin: 0.74 mg/dL (ref 0.20–1.20)
Total Bilirubin: 0.7 mg/dL (ref 0.3–1.2)
Total Protein: 6.1 g/dL — ABNORMAL LOW (ref 6.4–8.3)

## 2015-10-06 LAB — MAGNESIUM: Magnesium: 1.9 mg/dL (ref 1.7–2.4)

## 2015-10-06 LAB — CBC WITH DIFFERENTIAL/PLATELET
BASOS ABS: 0 10*3/uL (ref 0.0–0.1)
BASOS PCT: 0 %
EOS ABS: 0 10*3/uL (ref 0.0–0.7)
Eosinophils Relative: 0 %
HCT: 39.2 % (ref 39.0–52.0)
HEMOGLOBIN: 12.6 g/dL — AB (ref 13.0–17.0)
LYMPHS ABS: 0.6 10*3/uL — AB (ref 0.7–4.0)
Lymphocytes Relative: 8 %
MCH: 29.3 pg (ref 26.0–34.0)
MCHC: 32.1 g/dL (ref 30.0–36.0)
MCV: 91.2 fL (ref 78.0–100.0)
Monocytes Absolute: 0.1 10*3/uL (ref 0.1–1.0)
Monocytes Relative: 1 %
NEUTROS PCT: 91 %
Neutro Abs: 6.6 10*3/uL (ref 1.7–7.7)
Platelets: 125 10*3/uL — ABNORMAL LOW (ref 150–400)
RBC: 4.3 MIL/uL (ref 4.22–5.81)
RDW: 20.5 % — ABNORMAL HIGH (ref 11.5–15.5)
WBC: 7.2 10*3/uL (ref 4.0–10.5)

## 2015-10-06 LAB — PROTIME-INR
INR: 0.94 (ref 0.00–1.49)
PROTHROMBIN TIME: 12.8 s (ref 11.6–15.2)

## 2015-10-06 LAB — LACTIC ACID, PLASMA
LACTIC ACID, VENOUS: 0.9 mmol/L (ref 0.5–2.0)
Lactic Acid, Venous: 1.4 mmol/L (ref 0.5–2.0)

## 2015-10-06 LAB — BRAIN NATRIURETIC PEPTIDE: B NATRIURETIC PEPTIDE 5: 96.5 pg/mL (ref 0.0–100.0)

## 2015-10-06 LAB — APTT: APTT: 29 s (ref 24–37)

## 2015-10-06 MED ORDER — DEXAMETHASONE 4 MG PO TABS
4.0000 mg | ORAL_TABLET | Freq: Three times a day (TID) | ORAL | Status: DC
  Administered 2015-10-06 – 2015-10-07 (×5): 4 mg via ORAL
  Filled 2015-10-06 (×8): qty 1

## 2015-10-06 MED ORDER — RESOURCE INSTANT PROTEIN PO PWD PACKET
1.0000 | Freq: Two times a day (BID) | ORAL | Status: DC
  Administered 2015-10-06 – 2015-10-07 (×3): 6 g via ORAL
  Filled 2015-10-06 (×5): qty 6

## 2015-10-06 MED ORDER — MAGNESIUM SULFATE 2 GM/50ML IV SOLN
2.0000 g | Freq: Once | INTRAVENOUS | Status: AC
  Administered 2015-10-06: 2 g via INTRAVENOUS
  Filled 2015-10-06: qty 50

## 2015-10-06 MED ORDER — NYSTATIN 100000 UNIT/ML MT SUSP
5.0000 mL | Freq: Four times a day (QID) | OROMUCOSAL | Status: DC
  Administered 2015-10-06 – 2015-10-10 (×11): 500000 [IU] via ORAL
  Filled 2015-10-06 (×11): qty 5

## 2015-10-06 MED ORDER — ONDANSETRON HCL 4 MG PO TABS: 4.0000 mg | ORAL_TABLET | Freq: Four times a day (QID) | ORAL | Status: DC | PRN

## 2015-10-06 MED ORDER — BUTALBITAL-APAP-CAFF-COD 50-325-40-30 MG PO CAPS: 1.0000 | ORAL_CAPSULE | ORAL | Status: DC | PRN

## 2015-10-06 MED ORDER — LEVOTHYROXINE SODIUM 25 MCG PO TABS
25.0000 ug | ORAL_TABLET | Freq: Every day | ORAL | Status: DC
  Administered 2015-10-07 – 2015-10-08 (×2): 25 ug via ORAL
  Filled 2015-10-06 (×2): qty 1

## 2015-10-06 MED ORDER — ACETAMINOPHEN 325 MG PO TABS: 650.0000 mg | ORAL_TABLET | Freq: Four times a day (QID) | ORAL | Status: DC | PRN

## 2015-10-06 MED ORDER — LEVOFLOXACIN IN D5W 750 MG/150ML IV SOLN
750.0000 mg | Freq: Every day | INTRAVENOUS | Status: DC
  Administered 2015-10-07: 750 mg via INTRAVENOUS
  Filled 2015-10-06: qty 150

## 2015-10-06 MED ORDER — AMITRIPTYLINE HCL 25 MG PO TABS
25.0000 mg | ORAL_TABLET | Freq: Every day | ORAL | Status: DC
  Administered 2015-10-07: 25 mg via ORAL
  Filled 2015-10-06 (×2): qty 1

## 2015-10-06 MED ORDER — POTASSIUM CHLORIDE CRYS ER 20 MEQ PO TBCR
20.0000 meq | EXTENDED_RELEASE_TABLET | Freq: Every day | ORAL | Status: DC
  Administered 2015-10-07: 20 meq via ORAL
  Filled 2015-10-06: qty 1

## 2015-10-06 MED ORDER — CYCLOBENZAPRINE HCL 10 MG PO TABS
10.0000 mg | ORAL_TABLET | Freq: Three times a day (TID) | ORAL | Status: DC | PRN
Start: 2015-10-06 — End: 2015-10-08
  Administered 2015-10-06: 10 mg via ORAL
  Filled 2015-10-06: qty 1

## 2015-10-06 MED ORDER — SENNA 8.6 MG PO TABS: 1.0000 | ORAL_TABLET | Freq: Every day | ORAL | Status: DC | PRN

## 2015-10-06 MED ORDER — PROCEL PO POWD: Freq: Two times a day (BID) | ORAL | Status: DC

## 2015-10-06 MED ORDER — POTASSIUM CHLORIDE IN NACL 20-0.9 MEQ/L-% IV SOLN
INTRAVENOUS | Status: DC
  Administered 2015-10-06 (×2): via INTRAVENOUS
  Filled 2015-10-06 (×3): qty 1000

## 2015-10-06 MED ORDER — ONDANSETRON HCL 4 MG/2ML IJ SOLN: 4.0000 mg | Freq: Four times a day (QID) | INTRAMUSCULAR | Status: DC | PRN

## 2015-10-06 MED ORDER — CHLORHEXIDINE GLUCONATE 0.12 % MT SOLN
15.0000 mL | Freq: Four times a day (QID) | OROMUCOSAL | Status: DC
  Administered 2015-10-07 – 2015-10-09 (×7): 15 mL via OROMUCOSAL
  Filled 2015-10-06 (×8): qty 15

## 2015-10-06 MED ORDER — ENOXAPARIN SODIUM 40 MG/0.4ML ~~LOC~~ SOLN
40.0000 mg | SUBCUTANEOUS | Status: DC
  Administered 2015-10-06 – 2015-10-07 (×2): 40 mg via SUBCUTANEOUS
  Filled 2015-10-06 (×2): qty 0.4

## 2015-10-06 MED ORDER — PANTOPRAZOLE SODIUM 40 MG PO TBEC
40.0000 mg | DELAYED_RELEASE_TABLET | Freq: Every day | ORAL | Status: DC
  Administered 2015-10-07: 40 mg via ORAL
  Filled 2015-10-06: qty 1

## 2015-10-06 MED ORDER — CETYLPYRIDINIUM CHLORIDE 0.05 % MT LIQD
7.0000 mL | Freq: Two times a day (BID) | OROMUCOSAL | Status: DC
Start: 2015-10-06 — End: 2015-10-06

## 2015-10-06 MED ORDER — DOCUSATE SODIUM 100 MG PO CAPS
100.0000 mg | ORAL_CAPSULE | Freq: Every day | ORAL | Status: DC
  Administered 2015-10-07: 100 mg via ORAL
  Filled 2015-10-06: qty 1

## 2015-10-06 MED ORDER — BUTALBITAL-APAP-CAFFEINE 50-325-40 MG PO TABS: 1.0000 | ORAL_TABLET | ORAL | Status: DC | PRN

## 2015-10-06 MED ORDER — ADULT MULTIVITAMIN W/MINERALS CH
1.0000 | ORAL_TABLET | Freq: Every day | ORAL | Status: DC
  Administered 2015-10-07: 1 via ORAL
  Filled 2015-10-06 (×3): qty 1

## 2015-10-06 MED ORDER — CHLORHEXIDINE GLUCONATE 0.12 % MT SOLN: 15.0000 mL | Freq: Two times a day (BID) | OROMUCOSAL | Status: DC

## 2015-10-06 MED ORDER — LISINOPRIL 20 MG PO TABS
40.0000 mg | ORAL_TABLET | Freq: Every day | ORAL | Status: DC
  Administered 2015-10-07: 40 mg via ORAL
  Filled 2015-10-06: qty 2

## 2015-10-06 MED ORDER — SODIUM CHLORIDE 0.9% FLUSH
3.0000 mL | Freq: Two times a day (BID) | INTRAVENOUS | Status: DC
  Administered 2015-10-07 – 2015-10-09 (×2): 3 mL via INTRAVENOUS

## 2015-10-06 MED ORDER — HYDROCODONE-ACETAMINOPHEN 5-325 MG PO TABS
1.0000 | ORAL_TABLET | ORAL | Status: DC | PRN
  Administered 2015-10-06 – 2015-10-07 (×3): 1 via ORAL
  Filled 2015-10-06 (×3): qty 1

## 2015-10-06 MED ORDER — CALCIUM CARBONATE ANTACID 500 MG PO CHEW
1500.0000 mg | CHEWABLE_TABLET | Freq: Two times a day (BID) | ORAL | Status: DC
  Administered 2015-10-07: 1500 mg via ORAL
  Filled 2015-10-06: qty 8

## 2015-10-06 MED ORDER — LORAZEPAM 0.5 MG PO TABS
0.5000 mg | ORAL_TABLET | ORAL | Status: DC | PRN
Start: 2015-10-06 — End: 2015-10-08
  Administered 2015-10-06 – 2015-10-07 (×2): 0.5 mg via ORAL
  Filled 2015-10-06 (×2): qty 1

## 2015-10-06 MED ORDER — VERAPAMIL HCL ER 180 MG PO TBCR
180.0000 mg | EXTENDED_RELEASE_TABLET | Freq: Every day | ORAL | Status: DC
  Administered 2015-10-06 – 2015-10-07 (×2): 180 mg via ORAL
  Filled 2015-10-06 (×3): qty 1

## 2015-10-06 NOTE — Plan of Care (Signed)
Problem: Education: Goal: Knowledge of [facility] Education information/materials will improve Outcome: Not Met (add Reason) Patient is confused.

## 2015-10-06 NOTE — H&P (Signed)
[facility] History and Physical  Jake [name] [id number] DOB: Sep 08, 1955 DOA: 10/06/2015  Referring physician: Truitt Merle, MD PCP: Truitt Merle, MD   Chief Complaint: Altered mental status and dehydration.  HPI: Jake [name] is a 60 y.o. male with a past medical history of hypothyroidism, hypertension, allergies, arthritis, sleep apnea, urolithiasis, recurring gioblastoma multiforme of occipital lobe who was referred by his primary oncologist, Dr. Truitt Merle, for evaluation of worsening mental status dehydration and to rule out infection. The patient was started on avastin a week ago.  The patient's wife, in the past 2 days, patient has been getting progressively worse mental status. He has a tendency to get dehydrated and to waste potassium. His oral intake has been decreased. His headache has been worsening as well. To her knowledge, she denies fever, chills, but the patient has been very tired and fatigued lately. Patient has had occasional productive cough, but no nausea, emesis, diarrhea or constipation.  When seen in his room, the patient was confused, but in no acute distress. He answers simple questions, but was unable to provide further details.   Review of Systems:  Unable to review due to the patient's mental status.  History is provided by his wife.  Past Medical History  Diagnosis Date  . Thyroid disease   . Hypertension   . Allergy   . Arthritis   . Sleep apnea   . Kidney stone   . Glioblastoma multiforme of occipital lobe ([place])   . Nocturnal leg cramps 09/29/2014  . History of radiation therapy 08/03/14- 09/15/14    right occipito-parietal brain/46 Gy 23 fx, right brain boost/ 14 Gy in 7 fx   Past Surgical History  Procedure Laterality Date  . Knee arthroscopy w/ meniscal repair    . Treatment fistula anal    . Craniotomy N/A 07/07/2014    Procedure: CRANIOTOMY TUMOR EXCISION/CURVE;  Surgeon: Elaina Hoops, MD;  Location: [facility];  Service: Neurosurgery;   Laterality: N/A;  . Brain surgery     Social History:  reports that he has been smoking Cigarettes.  He has a 22.5 pack-year smoking history. He has never used smokeless tobacco. He reports that he drinks alcohol. He reports that he does not use illicit drugs.  Allergies  Allergen Reactions  . Other Nausea And Vomiting    Steroids - cause cramping all through the body - Headaches    Family History  Problem Relation Age of Onset  . Hypertension Mother   . Diabetes Father   . Glaucoma Father   . Diabetes Brother   . Diabetes Brother   . Colon cancer Neg Hx     Prior to Admission medications   Medication Sig Start Date End Date Taking? Authorizing Provider  acetaminophen (TYLENOL) 325 MG tablet Take 2 tablets (650 mg total) by mouth every 6 (six) hours as needed for mild pain (or Fever >/= 101). 07/04/15  Yes Robbie Lis, MD  amitriptyline (ELAVIL) 25 MG tablet Take 25 mg by mouth at bedtime.  06/30/15  Yes Historical Provider, MD  butalbital-acetaminophen-caffeine (FIORICET WITH CODEINE) 50-325-40-30 MG capsule Take 1 capsule by mouth every 4 (four) hours as needed for headache.   Yes Historical Provider, MD  calcium carbonate (TUMS EX) 750 MG chewable tablet Chew 2 tablets by mouth 2 (two) times daily. Reported on 09/27/2015   Yes Historical Provider, MD  chlorhexidine (PERIDEX) 0.12 % solution Rinse with 15 mls three daily for 30 seconds. Use after breakfast, lunch and  at bedtime. Spit out excess. Do not swallow. 05/10/15  Yes Lenn Cal, DDS  cyclobenzaprine (FLEXERIL) 10 MG tablet Take 1 tablet (10 mg total) by mouth 3 (three) times daily as needed for muscle spasms. 09/22/15  Yes Velvet Bathe, MD  dexamethasone (DECADRON) 4 MG tablet Take 1 tablet (4 mg total) by mouth 3 (three) times daily. 09/27/15  Yes Laurie Panda, NP  docusate sodium (COLACE) 100 MG capsule Take 100 mg by mouth daily.    Yes Historical Provider, MD  HYDROcodone-acetaminophen (NORCO/VICODIN) 5-325 MG  tablet Take 1 tablet by mouth every 4 (four) hours as needed for moderate pain. Do not exceed 4gm of Tylenol in 24 hours 09/25/15  Yes Tiffany L Reed, DO  LACTOBACILLUS PO Take 1 capsule by mouth daily.   Yes Historical Provider, MD  levothyroxine (SYNTHROID, LEVOTHROID) 25 MCG tablet Take 25 mcg by mouth daily before breakfast.   Yes Historical Provider, MD  lisinopril (PRINIVIL,ZESTRIL) 40 MG tablet Take 1 tablet (40 mg total) by mouth daily. 03/04/15  Yes Shawnee Knapp, MD  LORazepam (ATIVAN) 0.5 MG tablet take 1  tablet by mouth every 8 hours if needed for ANXIETY, NAUSEA, OR SLEEP 09/22/15  Yes Velvet Bathe, MD  Multiple Vitamins-Minerals (MULTIVITAMIN WITH MINERALS) tablet Take 1 tablet by mouth daily.   Yes Historical Provider, MD  NEXIUM 24HR 20 MG capsule Take 20 mg by mouth daily.  10/21/14  Yes Historical Provider, MD  NON FORMULARY Take 90 mLs by mouth 3 (three) times daily. Med Pass tid   Yes Historical Provider, MD  nystatin (MYCOSTATIN) 100000 UNIT/ML suspension Take 5 mLs (500,000 Units total) by mouth 4 (four) times daily. 07/07/15  Yes Truitt Merle, MD  potassium chloride SA (K-DUR,KLOR-CON) 20 MEQ tablet Take 20 mEq by mouth daily. X 4 days-stop 10/09/15 10/06/15  Yes Historical Provider, MD  Protein (PROCEL PO) Take by mouth 2 (two) times daily. 2 scoops bid   Yes Historical Provider, MD  senna (SENOKOT) 8.6 MG tablet Take 1 tablet (8.6 mg total) by mouth daily as needed for constipation. 09/22/15  Yes Velvet Bathe, MD  verapamil (COVERA HS) 180 MG (CO) 24 hr tablet Take 1 tablet (180 mg total) by mouth at bedtime. 03/04/15  Yes Shawnee Knapp, MD   Physical Exam: Filed Vitals:   10/06/15 1648 10/06/15 2020  BP: 117/89 126/80  Pulse: 115 105  Temp: 97.8 F (36.6 C) 97.4 F (36.3 C)  TempSrc: Oral Oral  Resp: 21 22  Height: 5\' 11"  (1.803 m)   Weight: 90.719 kg (200 lb)   SpO2: 96% 92%    Wt Readings from Last 3 Encounters:  10/06/15 90.719 kg (200 lb)  09/20/15 94.2 kg (207 lb 10.8 oz)    07/07/15 106.822 kg (235 lb 8 oz)    General:  Appears chronically ill and confused. Eyes: PERRL, normal lids, irises & conjunctiva ENT: grossly normal hearing, lips/oral mucosa are dry & tongue shows black hairy area. Neck: no LAD, masses or thyromegaly Cardiovascular: Tachycardic. Trace LE edema. Telemetry: Sinus tachycardia.  Respiratory: Scattered crackles and rhonchi. Decreased breath sounds and respiratory effort bilaterally.  Abdomen: BS +, soft, ntnd Skin: no rash or induration seen on limited exam Musculoskeletal: grossly normal tone BUE/BLE Psychiatric: Confused, speech is slowed. Neurologic: Awake, alert, oriented 2 with difficulty to remember place, moves all extremities.           Labs on Admission:  Basic Metabolic Panel:  Recent Labs Lab 10/06/15 1413 10/06/15  1936  NA 144 141  K 3.6 3.5  CL  --  110  CO2 20* 22  GLUCOSE 139 162*  BUN 26.6* 24*  CREATININE 0.6* 0.49*  CALCIUM 9.2 8.5*  MG  --  1.9   Liver Function Tests:  Recent Labs Lab 10/06/15 1413 10/06/15 1936  AST 77* 87*  ALT 104* 107*  ALKPHOS 151* 137*  BILITOT 0.74 0.7  PROT 6.1* 5.8*  ALBUMIN 2.9* 2.8*   CBC:  Recent Labs Lab 10/06/15 1936  WBC 7.2  NEUTROABS 6.6  HGB 12.6*  HCT 39.2  MCV 91.2  PLT 125*    Radiological Exams on Admission: Dg Chest Port 1 View  10/06/2015  CLINICAL DATA:  Sepsis. Glioma within the occipital lobe. Decreased blood pressure. EXAM: PORTABLE CHEST 1 VIEW COMPARISON:  07/03/2015 FINDINGS: Heart size is normal. No pleural effusion identified. Bilateral interstitial opacities are identified which appear new from previous exam. No lobar consolidation. IMPRESSION: 1. Bilateral interstitial opacities which may reflect atypical infection or edema. Electronically Signed   By: Kerby Moors M.D.   On: 10/06/2015 19:03    EKG: Independently reviewed. Vent. rate 107 BPM PR interval 150 ms QRS duration 86 ms QT/QTc 352/469 ms P-R-T axes 49 109  161 Sinus tachycardia Rightward axis Inferior infarct , age undetermined ST & T wave abnormality, consider anterolateral ischemia Abnormal ECG Sinus tachycardia, new inferior Q waves and new ST& T wave abnormality when compared to previous.  Assessment/Plan Principal Problem:   Altered mental status Admit to telemetry. Check CBC, CMP, lactic acid, phosphorus, magnesium. (Done already) Check urinalysis. Blood cultures 2. Check chest radiograph, EKG, and urinalysis. Start Levaquin 750 mg IVP every 24 hours.  Active Problems:   Hypothyroidism Continue levothyroxine. Check TSH periodically.    Essential hypertension Continue verapamil and lisinopril. Monitor blood pressure periodically.     Abnormal EKG. Check BMP and troponin levels Check echocardiogram.    GBM (glioblastoma multiforme) (HCC) Continue treatment as per oncology. Long-term prognosis is poor. Dr. Burr Medico suggested palliative care evaluation.    Intractable headache Continue dexamethasone. Continue analgesics as needed.    Code Status: Full code. DVT Prophylaxis: Lovenox SQ.  Family Communication: [name] was by bedside. Disposition Plan: Admit for further evaluation and treatment.   Time spent: Over 70 minutes were spent in the process of this admission.   Reubin Milan, M.D. [facility] Pager [phone].

## 2015-10-06 NOTE — Progress Notes (Signed)
Patient ID: Jake [name], male   DOB: 03-27-1956, 60 y.o.   MRN: [id number] 60 y.o. male with history of recurrent glioblastoma multiforme who has recently received treatment at [facility] last month - started on avastin last Friday, r/o infection, involve palliative care , Dr Burr Medico will follow on Monday , admit to tele due to low BP,

## 2015-10-06 NOTE — Telephone Encounter (Signed)
Dr Burr Medico set up admission with hospitalist Dr Allyson Sabal.  Called admitting & pt can go to [phone].  Ambulance transport will take pt to floor.  Report called to RN on [facility] notified of admission.  Informed floor RN that pt needs something for pain per wife/transport crew upon transfer.  Wife to admitting.

## 2015-10-06 NOTE — Progress Notes (Signed)
[place] FOLLOW UP NOTE  Patient Care Team: [name], MD as PCP - General (Family Medicine) Kary Kos, MD as Consulting Physician (Neurosurgery) Eppie Gibson, MD as Attending Physician (Radiation Oncology) Truitt Merle, MD as Consulting Physician (Hematology)     Glioblastoma of occipital lobe [name]) (Resolved)   07/02/2014 Imaging Brain MRI: Two adjacent enhancing mass lesions in the right parietal lobe with central necrosis and extensive surrounding edema and mass effect.  7 mm midline shift to the left.  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   CT CAP(-)   07/07/2014 Surgery Subtotal right parietal tumors resection    08/03/2014 Concurrent Chemotherapy Concurrent radiation with Temodar 75 mg/m daily, completed on 09/15/2014.    09/29/2014 - 12/28/2014 Chemotherapy Adjuvant Temodar, 150-200mg /m2 for 5 days, every 28 day cycle, s/p 3 cycles, stopped due to disease progression.   10/14/2014 Imaging Two discrete foci of enhancement in the RIGHT occipital lobe surround the larger resection cavity, and are suspected to represent foci of residual tumor/GBM displaying interval growt   12/13/2014 Imaging Increased size/ enhancement of 2 lesions adjacent to the right occipital resection cavity, concerning for growth of residual tumor.   01/06/2015 - 05/26/2015 Chemotherapy Bevacizumab 10mg /kg every 2 weeks, stopped due to disease progression    04/18/2015 Progression Brain MRI showed progressed masslike signal abnormality in the right occipital lobe, most compatible with interval to progression of disease.   04/28/2015 - 05/26/2015 Chemotherapy Irinotecan 1 25 mg/m, every 2 weeks, was added on to Avastin due to disease progression. Stopped due to disease progression.    06/02/2015 Progression Brain MRI showed further increased masslike T2 hyperintensity and irregular enhancement in the right occipital and posterior right temporal lobes since August again suggesting true  progression of disease. Intracranial mass effect is stable.   07/03/2015 - 07/04/2015 Hospital Admission He was admitted for worsening headaches and fever. ID workup was negative. His dexamethasone was increased, and his headaches are much improved. He was discharged home Today.   08/22/2015 Procedure Admission to [name] for AIRO guided right tempora sterostatic brain biopsy with placement of two therapeutic catheters. The frozen pathology showed recurrent malignant glioma. Infusion of D2C7 vaccine was started on 08/22/15. Catheters removed 12/26   09/19/2015 - 09/22/2015 Hospital Admission Intractable headache with worsening brain mass with midline shift. Discharged with steroids to rehab facility.    09/20/2015 Imaging Significant worsening of mass like T2 hyperintensity in the right hemisphere, with new extensive involvement of the splenium of the corpus callosum which tracks across midline to the left. Subsequent increased intracranial mass effect with leftward mid   09/27/2015 -  Chemotherapy Avastin 7.5mg /m2 every 3 weeks     GBM (glioblastoma multiforme) ([name])   07/22/2014 Initial Diagnosis GBM (glioblastoma multiforme)    CURRENT THERAPY: 1. [name] phase 1 clinical trail D2C7 vaccine started on 08/22/2015 2. Bevacizumab 7.5mg /kg every 3 weeks, started on 09/27/2015 3. Dexa increased to 6mg  q6h on 09/19/15, decreased to 4mg  q8h on 09/27/15    CHIEF COMPLAINTS: follow up GBM   HISTORY OF INITIAL PRESENTATION;   He initially presented was headaches and sinus congestion in mid October 2015. He was seen by his primary care physician and was treated for sinus infection. He subsequently developed left sided visual deficit, left arm and leg weakness and unsteady gait. He was sent to emergency room by his primary care physician on 07/02/2014. CT  And MRI of brain reviewed to add adjacent enhancing mass lesions in the  right parietal lobe with central necrosis and extensive surrounding edema and mass effect. He  was admitted and underwent right craniotomy  For resection of the 2 brain masses by Dr. Lonell Grandchild. Postsurgical brain MRI revealed a 1.3 cm area of enhancement at the anterior margin of surgical resection cavity, likely surgical change. His case was reviewed in the tumor board and it was felt he had subtotal resection.  INTERVAL HISTROY: Tray returns for follow-up. He was brought in by an ambulance from his nursing home, and he is also a comment by his wife to clinic today. He has been doing poorly since his hospital discharge, is bedbound, has not been able to participate much physical therapy, these 2 people assistance to get out of bed. He has poor appetite, eats little, she has intermittent confusion. He states his headache is better, he has moderate low back pain, no fever or chills. He was found to be hypotensive with blood pressure 85/71 heart rate 116, when he checked in. No fever or chills.   MEDICAL HISTORY:  Past Medical History  Diagnosis Date  . Thyroid disease   . Hypertension   . Allergy   . Arthritis   .    Marland Kitchen Kidney stone     SURGICAL HISTORY: Past Surgical History  Procedure Laterality Date  . Knee arthroscopy w/ meniscal repair    . Treatment fistula anal    . 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 Marital Status: Married    Spouse Name: N/A    Number of Children: 5 children, age of 35-21  . Years of Education: N/A   Occupational History  . 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    Social History Main Topics  . Smoking status: Current Every Day Smoker -- 1.00 packs/day for 45 years    Types: Cigarettes  . Smokeless tobacco: Never Used  . Alcohol Use: Yes     Comment: 2-3 beers per/day   . Drug Use: No  . Sexual Activity: Not on file     FAMILY  HISTORY: Family History  Problem Relation Age of Onset  . Hypertension Mother   . Diabetes Father   . Glaucoma Father   . Diabetes Brother   . Diabetes Brother   . Colon cancer Neg Hx   Paternal ancle had prostate cancer. No other family history of malignancy.  ALLERGIES:  is allergic to other.   MEDICATIONS:  Current Outpatient Prescriptions  Medication Sig Dispense Refill  . acetaminophen (TYLENOL) 325 MG tablet Take 2 tablets (650 mg total) by mouth every 6 (six) hours as needed for mild pain (or Fever >/= 101). (Patient not taking: Reported on 09/19/2015) 30 tablet 0  . amitriptyline (ELAVIL) 25 MG tablet Take 25 mg by mouth at bedtime.     . butalbital-acetaminophen-caffeine (FIORICET WITH CODEINE) 50-325-40-30 MG capsule Take 1 capsule by mouth every 4 (four) hours as needed for headache.    . calcium carbonate (TUMS EX) 750 MG chewable tablet Chew 2 tablets by mouth 2 (two) times daily. Reported on 09/27/2015    . chlorhexidine (PERIDEX) 0.12 % solution Rinse with 15 mls three daily for 30 seconds. Use after breakfast, lunch and at bedtime. Spit out excess. Do not swallow. (Patient not taking: Reported on 09/27/2015) 1440  mL prn  . cyclobenzaprine (FLEXERIL) 10 MG tablet Take 1 tablet (10 mg total) by mouth 3 (three) times daily as needed for muscle spasms. 30 tablet 0  . dexamethasone (DECADRON) 4 MG tablet Take 1 tablet (4 mg total) by mouth 3 (three) times daily. 42 tablet 1  . docusate sodium (COLACE) 100 MG capsule Take 100 mg by mouth daily.     Marland Kitchen HYDROcodone-acetaminophen (NORCO/VICODIN) 5-325 MG tablet Take 1 tablet by mouth every 4 (four) hours as needed for moderate pain. Do not exceed 4gm of Tylenol in 24 hours 180 tablet 0  . LACTOBACILLUS PO Take 1 capsule by mouth daily.    Marland Kitchen levothyroxine (SYNTHROID, LEVOTHROID) 50 MCG tablet Take 1 tablet (50 mcg total) by mouth daily before breakfast. 90 tablet 3  . lisinopril (PRINIVIL,ZESTRIL) 40 MG tablet Take 1 tablet (40 mg total)  by mouth daily. 90 tablet 3  . LORazepam (ATIVAN) 0.5 MG tablet take 1  tablet by mouth every 8 hours if needed for ANXIETY, NAUSEA, OR SLEEP 90 tablet 0  . Multiple Vitamins-Minerals (MULTIVITAMIN WITH MINERALS) tablet Take 1 tablet by mouth daily.    Marland Kitchen NEXIUM 24HR 20 MG capsule Take 20 mg by mouth daily.   0  . nystatin (MYCOSTATIN) 100000 UNIT/ML suspension Take 5 mLs (500,000 Units total) by mouth 4 (four) times daily. 473 mL 1  . senna (SENOKOT) 8.6 MG tablet Take 1 tablet (8.6 mg total) by mouth daily as needed for constipation.    . verapamil (COVERA HS) 180 MG (CO) 24 hr tablet Take 1 tablet (180 mg total) by mouth at bedtime. 90 tablet 3   No current facility-administered medications for this visit.    REVIEW OF SYSTEMS:   Constitutional: Denies fevers, chills or abnormal night sweats Eyes: Denies blurriness of vision, double vision or watery eyes Ears, nose, mouth, throat, and face: Denies mucositis or sore throat Respiratory: Denies cough, dyspnea or wheezes Cardiovascular: Denies palpitation, chest discomfort or lower extremity swelling Gastrointestinal:  Denies nausea, heartburn or change in bowel habits Skin: Denies abnormal skin rashes Lymphatics: Denies new lymphadenopathy or easy bruising Neurological:positive for mild headaches, and left side vision deficits. His left-sided weakness has nearly resolved. Denies numbness, tingling or new weaknesses Behavioral/Psych: Mood is stable, no new changes  All other systems were reviewed with the patient and are negative.  PHYSICAL EXAMINATION: ECOG PERFORMANCE STATUS: 1 - Symptomatic but completely ambulatory  KPS: 80%  Filed Vitals:   10/06/15 1425  BP: 85/71  Pulse: 116  Temp: 98 F (36.7 C)  Resp: 23   Filed Weights    GENERAL:lying on the stretch, slightly confused, able to answer some simple questions and follow commands. SKIN: skin color, texture, turgor are normal, no rashes or significant lesions HEAD:  right-sided posterior craniotomy surgical wound is healing well, no surrounding edema, skin redness or discharge EYES: normal, conjunctiva are pink and non-injected, sclera clear OROPHARYNX:no exudate, no erythema and lips, buccal mucosa, and (+) black hairy tongue  NECK: supple, thyroid normal size, non-tender, without nodularity LYMPH:  no palpable lymphadenopathy in the cervical, axillary or inguinal LUNGS: clear to auscultation and percussion with normal breathing effort HEART: regular rate & rhythm and no murmurs and no lower extremity edema ABDOMEN:abdomen soft, non-tender and normal bowel sounds Musculoskeletal:no cyanosis of digits and no clubbing  PSYCH: alert & oriented x 3 with fluent speech NEURO: cranial nerve 2-12 are intact, left UE strength is slightly low   LABORATORY DATA:  CBC Latest  Ref Rng 10/06/2015 09/27/2015 09/20/2015  WBC 4.0 - 10.5 K/uL 7.2 8.3 6.9  Hemoglobin 13.0 - 17.0 g/dL 12.6(L) 12.8(L) 11.0(L)  Hematocrit 39.0 - 52.0 % 39.2 38.7 34.1(L)  Platelets 150 - 400 K/uL 125(L) 148 164    CMP Latest Ref Rng 10/06/2015 10/06/2015 09/27/2015  Glucose 65 - 99 mg/dL 162(H) 139 106  BUN 6 - 20 mg/dL 24(H) 26.6(H) 33.1(H)  Creatinine 0.61 - 1.24 mg/dL 0.49(L) 0.6(L) 0.8  Sodium 135 - 145 mmol/L 141 144 143  Potassium 3.5 - 5.1 mmol/L 3.5 3.6 3.5  Chloride 101 - 111 mmol/L 110 - -  CO2 22 - 32 mmol/L 22 20(L) 24  Calcium 8.9 - 10.3 mg/dL 8.5(L) 9.2 9.2  Total Protein 6.5 - 8.1 g/dL 5.8(L) 6.1(L) 6.2(L)  Total Bilirubin 0.3 - 1.2 mg/dL 0.7 0.74 0.56  Alkaline Phos 38 - 126 U/L 137(H) 151(H) 96  AST 15 - 41 U/L 87(H) 77(H) 33  ALT 17 - 63 U/L 107(H) 104(H) 59(H)     Surgical path 07/07/2014  Diagnosis 1. Brain, biopsy - GLIOBLASTOMA MULTIFORME, WHO GRADE IV/IV. - SEE ONCOLOGY TABLE BELOW. 2. Brain, for tumor resection, higher right parietal - GLIOBLASTOMA MULTIFORME, WHO GRADE IV/IV. - SEE ONCOLOGY TABLE BELOW. 3. Brain, for tumor resection, lower right occipital -  GLIOBLASTOMA MULTIFORME, WHO GRADE IV/IV   Microscopic Comment 1. -3. ONCOLOGY TABLE - BRAIN AND SPINAL CORD 1. Procedure: Resection x2 2. Tumor site, including laterality: Right parietal and right occipital 3. Maximum tumor size (cm): At least 1.8 cm 4. Histologic type: Glioblastoma multiforme 5. Grade: WHO Grade IV/IV 6. Margins (if applicable): Can not be assessed 7. Ancillary studies: Can be performed upon clinician request.  RADIOGRAPHIC STUDIES: I have personally reviewed the radiological images as listed and agreed with the findings in the report.  Mr [name] Wo Contrast 09/21/2015 IMPRESSION: Significant worsening of mass like T2 hyperintensity in the right hemisphere, with new extensive involvement of the splenium of the corpus callosum which tracks across midline to the left. Subsequent increased intracranial mass effect with leftward midline shift now 8-9 mm (4 mm in October). Suspected early trapping of the left lateral ventricle.  True progression of disease was strongly favored in October considering the patient had not yet begun new therapy at that time. However, if the patient has undergone interval vaccination/immune therapy at [facility] then there is a chance that these changes since October are treatment effect.  No new intracranial abnormality.  ASSESSMENT & PLAN:  Mr. Salzberg is a 60 year old gentleman with newly diagnosed right parietal GBM, status post subtotal resection.   1. GBM, s/p subtotal resection, and concurrent chemoRT completed on 09/15/2014, progressed on adjuvant Temodar.  -He started the phase 1 vaccine clinical trial at at [facility] in December, his neurological function has deteriorated significantly -He has significant intracranial mass effect with left midline shift, and surrounding edema -His overall outcome is rather poor. I discussed continuing Avastin, versus considering palliative care alone and hospice with pt and his wife, giving his quick  deterioration lately. Patient's wife voiced good understanding about the overall poor prognosis, and wishes to continue Avastin therapy for now. -Due to his hypertension, tachycardia and tachypnea, I recommend direct hospital admission to ruled out sepsis and continue supportive care  -We'll continue tapering dexamethasone, consider decrease to 4 mg twice daily after his hospital discharge -I contacted Dr. Imagene Riches early today   2. Anorexia, fatigue, neurological deterioration -worse latelu -nutrition supplement and supportive care -fall precaution  3.  HTN and hypothyroidism  -continue follow up with PCP -TSH normal on 03/03/2015  4. Code status -I recommend DO NOT RESUSCITATE and DO NOT INTUBATE, patient and his wife agrees.  Plan: -direct hospital admission, I spoke with [facility] hospitalist who has kindly accepted him -I will see him in house on Monday  -next Avastin is scheduled for 2/15  All questions were answered. The patient knows to call the clinic with any problems, questions or concerns.  I spent 25 minutes counseling the patient face to face. The total time spent in the appointmen was 40 minutes and more than 50% was on counseling.     Truitt Merle, MD 10/06/2015

## 2015-10-07 ENCOUNTER — Inpatient Hospital Stay (HOSPITAL_COMMUNITY): Payer: 59

## 2015-10-07 DIAGNOSIS — I10 Essential (primary) hypertension: Secondary | ICD-10-CM

## 2015-10-07 DIAGNOSIS — R9431 Abnormal electrocardiogram [ECG] [EKG]: Secondary | ICD-10-CM | POA: Insufficient documentation

## 2015-10-07 DIAGNOSIS — J189 Pneumonia, unspecified organism: Secondary | ICD-10-CM | POA: Diagnosis present

## 2015-10-07 DIAGNOSIS — G9340 Encephalopathy, unspecified: Secondary | ICD-10-CM | POA: Diagnosis present

## 2015-10-07 DIAGNOSIS — R51 Headache: Secondary | ICD-10-CM

## 2015-10-07 DIAGNOSIS — E038 Other specified hypothyroidism: Secondary | ICD-10-CM

## 2015-10-07 LAB — URINALYSIS, ROUTINE W REFLEX MICROSCOPIC
Glucose, UA: NEGATIVE mg/dL
HGB URINE DIPSTICK: NEGATIVE
KETONES UR: 15 mg/dL — AB
Leukocytes, UA: NEGATIVE
NITRITE: NEGATIVE
Protein, ur: NEGATIVE mg/dL
Specific Gravity, Urine: 1.029 (ref 1.005–1.030)
pH: 7 (ref 5.0–8.0)

## 2015-10-07 LAB — STREP PNEUMONIAE URINARY ANTIGEN: STREP PNEUMO URINARY ANTIGEN: NEGATIVE

## 2015-10-07 LAB — TROPONIN I
TROPONIN I: 0.04 ng/mL — AB (ref <0.031)
Troponin I: 0.04 ng/mL — ABNORMAL HIGH (ref <0.031)
Troponin I: 0.03 ng/mL (ref <0.031)

## 2015-10-07 MED ORDER — TAMSULOSIN HCL 0.4 MG PO CAPS
0.4000 mg | ORAL_CAPSULE | Freq: Every day | ORAL | Status: DC
  Administered 2015-10-07: 0.4 mg via ORAL
  Filled 2015-10-07: qty 1

## 2015-10-07 MED ORDER — VANCOMYCIN HCL IN DEXTROSE 1-5 GM/200ML-% IV SOLN
1000.0000 mg | Freq: Three times a day (TID) | INTRAVENOUS | Status: DC
  Administered 2015-10-07 – 2015-10-09 (×6): 1000 mg via INTRAVENOUS
  Filled 2015-10-07 (×6): qty 200

## 2015-10-07 MED ORDER — DEXTROSE 5 % IV SOLN
500.0000 mg | INTRAVENOUS | Status: DC
  Administered 2015-10-07 – 2015-10-08 (×2): 500 mg via INTRAVENOUS
  Filled 2015-10-07 (×2): qty 500

## 2015-10-07 MED ORDER — DEXTROSE 5 % IV SOLN
1.0000 g | INTRAVENOUS | Status: DC
  Administered 2015-10-07 – 2015-10-08 (×2): 1 g via INTRAVENOUS
  Filled 2015-10-07 (×3): qty 10

## 2015-10-07 NOTE — Progress Notes (Addendum)
Pharmacy Antibiotic Note  Jake [name] is a 60 y.o. male admitted on 10/06/2015 with HCAP.  Pharmacy has been consulted for vancomycin and ceftriaxone dosing.  PMH end-stage glioblastoma.  Presented with generalized weakness/AMS.  CXR showing BL opacities = atypical infxn vs edema  Today, 10/07/2015: Temp: afebrile WBC: wnl Renal: SCr wnl at baseline; CrCl > 90 CG/N  Plan: Vancomycin 1000 IV every 8 hours.  Goal trough 15-20 mcg/mL. Ceftriaxone 1 g IV q24 hr  Azithromycin 500 mg IV q24 per MD; dose appropriate  Height: 5\' 11"  (180.3 cm) Weight: 200 lb (90.719 kg) IBW/kg (Calculated) : 75.3  Temp (24hrs), Avg:97.6 F (36.4 C), Min:97.4 F (36.3 C), Max:97.7 F (36.5 C)   Recent Labs Lab 10/06/15 1413 10/06/15 1936 10/06/15 2300  WBC  --  7.2  --   CREATININE 0.6* 0.49*  --   LATICACIDVEN  --  1.4 0.9    Estimated Creatinine Clearance: 113.2 mL/min (by C-G formula based on Cr of 0.49).    Allergies  Allergen Reactions  . Other Nausea And Vomiting    Steroids - cause cramping all through the body - Headaches    Antimicrobials this admission: Levofloxacin 2/3 >> 2/3 Azithromycin 2/4 >>  Ceftriaxone 2/4 >>  Vancomycin 2/4 >>    Dose adjustments this admission:   Microbiology results: 2/3 BCx: ngtd   Thank you for allowing pharmacy to be a part of this patient's care.  Reuel Boom, PharmD, BCPS Pager: [phone] 10/07/2015, 7:20 PM

## 2015-10-07 NOTE — Progress Notes (Signed)
[facility] Progress Note  Patient: Jake [name] [id number]   PCP: Truitt Merle, MD DOB: May 21, 1956   DOA: 10/06/2015   DOS: 10/07/2015   Date of Service: the patient was seen and examined on 10/07/2015  Subjective: Patient continues to complain of some headache. Denies any nausea or vomiting. No chest pain. Has occasional confusion overnight. Also has developed her urinary retention Nutrition: Able to tolerate oral diet Activity: Mostly bedridden Last BM: 10/07/2015  Assessment and Plan: 1. Acute encephalopathy  Suspected healthcare associated pneumonia.  Presented with generalized weakness as well as confusion. He was felt to be dehydrated. Patient has history of glioblastoma and recent MRI shows worsening findings. Patient has chronic headache due to the same and is on Decadron. Patient is scheduled for trial medication at [facility] although due to his high requirement of the steroids it is unclear whether with patient qualify for the trial medication or not. Primary oncologist has recommended ruling out sepsis with the patient as well as possible palliative care consultation with hospice. At present the family wishes to continue with current treatment. We will change the antibiotics from levofloxacin to ceftriaxone, azithromycin, vancomycin. Patient is at high risk for MRSA low risk for Pseudomonas.   2. Glioblastoma multiform. We decided discussed with patient's wife tomorrow. As per the note from the oncology recommend possible palliative care consultation as well as hospice. Continue with current treatment at present with Decadron. Prognosis is significantly poor given his progressive disease as well as midline shift.    Hypothyroidism Continue Synthroid.    Essential hypertension Blood pressure stable currently holding blood pressure medications.  DVT Prophylaxis: subcutaneous Heparin. Now mechanical compression Nutrition: Aspiration precaution, regular  diet Advance goals of care discussion: Full code  Brief Summary of Hospitalization:  HPI: As per the H and P dictated on admission, "Jake [name] is a 60 y.o. male with a past medical history of hypothyroidism, hypertension, allergies, arthritis, sleep apnea, urolithiasis, recurring gioblastoma multiforme of occipital lobe who was referred by his primary oncologist, Dr. Truitt Merle, for evaluation of worsening mental status dehydration and to rule out infection. The patient was started on avastin a week ago.  The patient's wife, in the past 2 days, patient has been getting progressively worse mental status. He has a tendency to get dehydrated and to waste potassium. His oral intake has been decreased. His headache has been worsening as well. To her knowledge, she denies fever, chills, but the patient has been very tired and fatigued lately. Patient has had occasional productive cough, but no nausea, emesis, diarrhea or constipation.  When seen in his room, the patient was confused, but in no acute distress. He answers simple questions, but was unable to provide further details." Daily update, Procedures: None Consultants: None Antibiotics: Anti-infectives    Start     Dose/Rate Route Frequency Ordered Stop   10/06/15 2300  levofloxacin (LEVAQUIN) IVPB 750 mg     750 mg 100 mL/hr over 90 Minutes Intravenous Daily at bedtime 10/06/15 2258 10/11/15 2159      Family Communication: no family was present at bedside, at the time of interview.   Disposition:  Barriers to safe discharge: mentation changes   Intake/Output Summary (Last 24 hours) at 10/07/15 1846 Last data filed at 10/07/15 0729  Gross per 24 hour  Intake      0 ml  Output    600 ml  Net   -600 ml   Filed Weights   10/06/15 1648  Weight: 90.719  kg (200 lb)    Objective: Physical Exam: Filed Vitals:   10/06/15 2020 10/07/15 0636 10/07/15 1010 10/07/15 1538  BP: 126/80 128/90 125/86   Pulse: 105 78  94  Temp: 97.4 F  (36.3 C)   97.7 F (36.5 C)  TempSrc: Oral   Oral  Resp: 22 20  22   Height:      Weight:      SpO2: 92% 90%  95%     General: Appear in mild distress, no Rash; Oral Mucosa moist. Cardiovascular: S1 and S2 Present, no Murmur, no JVD Respiratory: Bilateral Air entry present and Clear to Auscultation, no Crackles, no wheezes Abdomen: Bowel Sound present, Soft and no tenderness Extremities: no Pedal edema, no calf tenderness Neurology: Grossly no focal neuro deficit, other than lethargic  Data Reviewed: CBC:  Recent Labs Lab 10/06/15 1936  WBC 7.2  NEUTROABS 6.6  HGB 12.6*  HCT 39.2  MCV 91.2  PLT 0000000*   Basic Metabolic Panel:  Recent Labs Lab 10/06/15 1413 10/06/15 1936  NA 144 141  K 3.6 3.5  CL  --  110  CO2 20* 22  GLUCOSE 139 162*  BUN 26.6* 24*  CREATININE 0.6* 0.49*  CALCIUM 9.2 8.5*  MG  --  1.9   Liver Function Tests:  Recent Labs Lab 10/06/15 1413 10/06/15 1936  AST 77* 87*  ALT 104* 107*  ALKPHOS 151* 137*  BILITOT 0.74 0.7  PROT 6.1* 5.8*  ALBUMIN 2.9* 2.8*   No results for input(s): LIPASE, AMYLASE in the last 168 hours. No results for input(s): AMMONIA in the last 168 hours.  Cardiac Enzymes:  Recent Labs Lab 10/06/15 0058 10/07/15 0524 10/07/15 1100  TROPONINI 0.04* 0.04* 0.03    BNP (last 3 results)  Recent Labs  10/06/15 2300  BNP 96.5    CBG: No results for input(s): GLUCAP in the last 168 hours.  Recent Results (from the past 240 hour(s))  Culture, blood (x 2)     Status: None (Preliminary result)   Collection Time: 10/06/15  7:55 PM  Result Value Ref Range Status   Specimen Description BLOOD RIGHT ARM  Final   Special Requests BOTTLES DRAWN AEROBIC AND ANAEROBIC 10CC  Final   Culture   Final    NO GROWTH < 24 HOURS Performed at [facility]    Report Status PENDING  Incomplete  Culture, blood (x 2)     Status: None (Preliminary result)   Collection Time: 10/06/15  8:05 PM  Result Value Ref Range  Status   Specimen Description BLOOD LEFT ARM  Final   Special Requests BOTTLES DRAWN AEROBIC AND ANAEROBIC 10CC  Final   Culture   Final    NO GROWTH < 24 HOURS Performed at [facility]    Report Status PENDING  Incomplete     Studies: Dg Chest Port 1 View  10/06/2015  CLINICAL DATA:  Sepsis. Glioma within the occipital lobe. Decreased blood pressure. EXAM: PORTABLE CHEST 1 VIEW COMPARISON:  07/03/2015 FINDINGS: Heart size is normal. No pleural effusion identified. Bilateral interstitial opacities are identified which appear new from previous exam. No lobar consolidation. IMPRESSION: 1. Bilateral interstitial opacities which may reflect atypical infection or edema. Electronically Signed   By: Kerby Moors M.D.   On: 10/06/2015 19:03     Scheduled Meds: . amitriptyline  25 mg Oral QHS  . calcium carbonate  1,500 mg Oral BID  . chlorhexidine  15 mL Mouth/Throat QID  . dexamethasone  4 mg Oral TID  . docusate sodium  100 mg Oral Daily  . enoxaparin (LOVENOX) injection  40 mg Subcutaneous Q24H  . levofloxacin (LEVAQUIN) IV  750 mg Intravenous QHS  . levothyroxine  25 mcg Oral QAC breakfast  . lisinopril  40 mg Oral Daily  . multivitamin with minerals  1 tablet Oral Daily  . nystatin  5 mL Oral QID  . pantoprazole  40 mg Oral Daily  . potassium chloride SA  20 mEq Oral Daily  . protein supplement  1 scoop Oral BID  . sodium chloride flush  3 mL Intravenous Q12H  . tamsulosin  0.4 mg Oral QPC supper  . verapamil  180 mg Oral QHS   Continuous Infusions:  PRN Meds: acetaminophen, butalbital-acetaminophen-caffeine, cyclobenzaprine, HYDROcodone-acetaminophen, LORazepam, ondansetron **OR** ondansetron (ZOFRAN) IV, senna  Time spent: 30 minutes  Author: Berle Mull, MD [facility] Pager: [phone] 10/07/2015 6:46 PM  If 7PM-7AM, please contact night-coverage at [email or web address], password [facility]

## 2015-10-07 NOTE — Clinical Social Work Note (Signed)
Clinical Social Work Assessment  Patient Details  Name: Jake [name] MRN: [id number] Date of Birth: 1956/03/10  Date of referral:  10/07/15               Reason for consult:  Discharge Planning                Permission sought to share information with:  Family Supports Permission granted to share information::  Yes, Verbal Permission Granted  Name::     Jake [name]  Agency::     Relationship::  spouse  Contact Information:  [phone]  Housing/Transportation Living arrangements for the past 2 months:  [place] of Information:  Patient Patient Interpreter Needed:  None Criminal Activity/Legal Involvement Pertinent to Current Situation/Hospitalization:  No - Comment as needed Significant Relationships:  Spouse Lives with:  Facility Resident (Patient admitted from [facility]) Do you feel safe going back to the place where you live?  Yes Need for family participation in patient care:  No (Coment)  Care giving concerns:  Pt admitted from [facility]. Pt wife did not have any complaints about care at [facility].   Social Worker assessment / plan:  CSW received referral that pt admitted from [facility].  CSW visited pt room. Pt sleeping and no family present. Per chart, pt oriented to person only.  CSW contacted pt wife, Jake via telephone. Pt wife confirmed pt admitted from [facility] and plan is for pt to return when stable. Pt wife reports that she will be coming to hospital soon.   CSW confirmed with [facility] that pt is a resident.   CSW to continue to follow.  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)  Patient/Family's Response to care:  Pt unable to participate in assessment. Pt wife actively involved in pt care. Pt wife agreeable for pt to  return to [facility] after hospitalization.   Patient/Family's Understanding of and Emotional Response to Diagnosis, Current Treatment, and Prognosis:  Not assessed at this time, pt just admitted to hospital overnight.   Emotional Assessment Appearance:  Appears stated age Attitude/Demeanor/Rapport:  Unable to Assess Affect (typically observed):  Unable to Assess Orientation:  Oriented to Self Alcohol / Substance use:  Not Applicable Psych involvement (Current and /or in the community):  No (Comment)  Discharge Needs  Concerns to be addressed:  Discharge Planning Concerns Readmission within the last 30 days:  Yes Current discharge risk:    Barriers to Discharge:  Continued Medical Work up   Ladell Pier, LCSW 10/07/2015, 10:48 AM Weekend coverage [phone]

## 2015-10-07 NOTE — Progress Notes (Signed)
Pt had no urinary output overnight. Bladder scan showed 720cc. [place] notified. In and out cath ordered. Hessie Varone, Bing Neighbors, RN

## 2015-10-07 NOTE — Progress Notes (Signed)
  Echocardiogram 2D Echocardiogram has been performed.  Lysle Rubens 10/07/2015, 9:56 AM

## 2015-10-08 ENCOUNTER — Inpatient Hospital Stay (HOSPITAL_COMMUNITY): Payer: 59

## 2015-10-08 DIAGNOSIS — Z7189 Other specified counseling: Secondary | ICD-10-CM

## 2015-10-08 DIAGNOSIS — Z515 Encounter for palliative care: Secondary | ICD-10-CM

## 2015-10-08 DIAGNOSIS — R531 Weakness: Secondary | ICD-10-CM | POA: Diagnosis present

## 2015-10-08 DIAGNOSIS — M6289 Other specified disorders of muscle: Secondary | ICD-10-CM

## 2015-10-08 LAB — CBC WITH DIFFERENTIAL/PLATELET
Basophils Absolute: 0 10*3/uL (ref 0.0–0.1)
Basophils Relative: 0 %
EOS PCT: 0 %
Eosinophils Absolute: 0 10*3/uL (ref 0.0–0.7)
HEMATOCRIT: 33.3 % — AB (ref 39.0–52.0)
HEMOGLOBIN: 11.2 g/dL — AB (ref 13.0–17.0)
LYMPHS ABS: 0.7 10*3/uL (ref 0.7–4.0)
LYMPHS PCT: 7 %
MCH: 30 pg (ref 26.0–34.0)
MCHC: 33.6 g/dL (ref 30.0–36.0)
MCV: 89.3 fL (ref 78.0–100.0)
Monocytes Absolute: 0.2 10*3/uL (ref 0.1–1.0)
Monocytes Relative: 2 %
Neutro Abs: 8.5 10*3/uL — ABNORMAL HIGH (ref 1.7–7.7)
Neutrophils Relative %: 90 %
PLATELETS: 119 10*3/uL — AB (ref 150–400)
RBC: 3.73 MIL/uL — AB (ref 4.22–5.81)
RDW: 20.1 % — ABNORMAL HIGH (ref 11.5–15.5)
WBC: 9.4 10*3/uL (ref 4.0–10.5)

## 2015-10-08 LAB — COMPREHENSIVE METABOLIC PANEL
ALBUMIN: 2.6 g/dL — AB (ref 3.5–5.0)
ALT: 90 U/L — ABNORMAL HIGH (ref 17–63)
AST: 69 U/L — AB (ref 15–41)
Alkaline Phosphatase: 109 U/L (ref 38–126)
Anion gap: 7 (ref 5–15)
BUN: 19 mg/dL (ref 6–20)
CHLORIDE: 109 mmol/L (ref 101–111)
CO2: 21 mmol/L — ABNORMAL LOW (ref 22–32)
Calcium: 8.2 mg/dL — ABNORMAL LOW (ref 8.9–10.3)
Creatinine, Ser: 0.5 mg/dL — ABNORMAL LOW (ref 0.61–1.24)
GFR calc Af Amer: >60 mL/min (ref >60)
Glucose, Bld: 131 mg/dL — ABNORMAL HIGH (ref 65–99)
POTASSIUM: 4.1 mmol/L (ref 3.5–5.1)
Sodium: 137 mmol/L (ref 135–145)
Total Bilirubin: 0.7 mg/dL (ref 0.3–1.2)
Total Protein: 5.3 g/dL — ABNORMAL LOW (ref 6.5–8.1)

## 2015-10-08 LAB — GLUCOSE, CAPILLARY: Glucose-Capillary: 95 mg/dL (ref 65–99)

## 2015-10-08 MED ORDER — SODIUM CHLORIDE 0.9 % IV SOLN
INTRAVENOUS | Status: DC
  Administered 2015-10-08 (×2): 50 mL via INTRAVENOUS

## 2015-10-08 MED ORDER — GLYCOPYRROLATE 0.2 MG/ML IJ SOLN
0.2000 mg | INTRAMUSCULAR | Status: DC | PRN
  Filled 2015-10-08: qty 1

## 2015-10-08 MED ORDER — MORPHINE SULFATE (CONCENTRATE) 10 MG/0.5ML PO SOLN: 5.0000 mg | ORAL | Status: DC | PRN

## 2015-10-08 MED ORDER — LORAZEPAM 1 MG PO TABS
1.0000 mg | ORAL_TABLET | ORAL | Status: DC | PRN
  Filled 2015-10-08: qty 1

## 2015-10-08 MED ORDER — ACETAMINOPHEN 325 MG PO TABS: 650.0000 mg | ORAL_TABLET | Freq: Four times a day (QID) | ORAL | Status: DC | PRN

## 2015-10-08 MED ORDER — BIOTENE DRY MOUTH MT LIQD: 15.0000 mL | OROMUCOSAL | Status: DC | PRN

## 2015-10-08 MED ORDER — DEXAMETHASONE SODIUM PHOSPHATE 4 MG/ML IJ SOLN
4.0000 mg | Freq: Three times a day (TID) | INTRAMUSCULAR | Status: DC
  Administered 2015-10-08 – 2015-10-09 (×5): 4 mg via INTRAVENOUS
  Filled 2015-10-08 (×5): qty 1

## 2015-10-08 MED ORDER — MORPHINE SULFATE (PF) 2 MG/ML IV SOLN
1.0000 mg | INTRAVENOUS | Status: DC | PRN
  Filled 2015-10-08: qty 1

## 2015-10-08 MED ORDER — LORAZEPAM 2 MG/ML IJ SOLN: 1.0000 mg | INTRAMUSCULAR | Status: DC | PRN

## 2015-10-08 MED ORDER — POLYVINYL ALCOHOL 1.4 % OP SOLN
1.0000 [drp] | Freq: Four times a day (QID) | OPHTHALMIC | Status: DC | PRN
  Filled 2015-10-08: qty 15

## 2015-10-08 MED ORDER — MORPHINE SULFATE (PF) 2 MG/ML IV SOLN
2.0000 mg | INTRAVENOUS | Status: DC | PRN
  Administered 2015-10-08 – 2015-10-09 (×3): 2 mg via INTRAVENOUS
  Filled 2015-10-08 (×2): qty 1

## 2015-10-08 MED ORDER — LEVOTHYROXINE SODIUM 25 MCG PO TABS
25.0000 ug | ORAL_TABLET | Freq: Every day | ORAL | Status: DC
  Administered 2015-10-09 – 2015-10-10 (×2): 25 ug via ORAL
  Filled 2015-10-08 (×2): qty 1

## 2015-10-08 MED ORDER — ACETAMINOPHEN 650 MG RE SUPP: 650.0000 mg | Freq: Four times a day (QID) | RECTAL | Status: DC | PRN

## 2015-10-08 MED ORDER — GLYCOPYRROLATE 1 MG PO TABS
1.0000 mg | ORAL_TABLET | ORAL | Status: DC | PRN
  Filled 2015-10-08: qty 1

## 2015-10-08 MED ORDER — LORAZEPAM 2 MG/ML PO CONC: 1.0000 mg | ORAL | Status: DC | PRN

## 2015-10-08 NOTE — Progress Notes (Signed)
Utilization Review Completed.Ralene Gasparyan T2/01/2016  

## 2015-10-08 NOTE — Progress Notes (Signed)
[facility] Progress Note  Patient: Jake [name] [id number]   PCP: Truitt Merle, MD DOB: October 24, 1955   DOA: 10/06/2015   DOS: 10/08/2015   Date of Service: the patient was seen and examined on 10/08/2015  Subjective: This morning the patient was more lethargic and was also having increased weakness on the left side. Nutrition: Able to tolerate oral diet Activity: Mostly bedridden Last BM: 10/08/2015 incontinent  Assessment and Plan: 1. Acute encephalopathy  Suspected healthcare associated pneumonia.  Presented with generalized weakness as well as confusion. He was felt to be dehydrated. Patient has history of glioblastoma and recent MRI shows worsening findings. Patient has chronic headache due to the same and is on Decadron. Patient is scheduled for trial medication at [facility] although due to his high requirement of the steroids it is unclear whether with patient qualify for the trial medication or not. Primary oncologist has recommended ruling out sepsis with the patient as well as possible palliative care consultation with hospice. Currently continue ceftriaxone, azithromycin, vancomycin.   2. Glioblastoma multiform. Discussed with patient's wife at bedside. This morning the patient was more lethargic and also had pronounced weakness on the left side, urgent CAT scan was done which was positive for increasing midline shift as well as edema. I had a lengthy discussion with the wife about possible prognosis and outcome from worsening midline shift including brain herniation as well as seizures and increasing lethargy and metabolic encephalopathy, including aspiration events fall trauma or injury and sudden death. Given the rapid progression of the midline shift in less than 1 month and based on evaluation from the primary oncologist it was felt that the patient's prognosis is significantly poor with aggressive treatment and this was explained to the patient and the family. Wife  requested to focus the care on comfort and was decided that the patient will be transitioned to hospice, possibly inpatient. Palliative care was consulted and appreciate their input. Social worker consulted for possible inpatient hospice at [facility]. Orders were reviewed by palliative care. Continue with current treatment at present with Decadron.    Hypothyroidism Continue Synthroid.    Essential hypertension Blood pressure stable currently holding blood pressure medications.  DVT Prophylaxis: subcutaneous Heparin. Now mechanical compression Nutrition: Aspiration precaution, regular diet Advance goals of care discussion: Full code  Brief Summary of Hospitalization:  HPI: As per the H and P dictated on admission, "Jameis Koltes is a 60 y.o. male with a past medical history of hypothyroidism, hypertension, allergies, arthritis, sleep apnea, urolithiasis, recurring gioblastoma multiforme of occipital lobe who was referred by his primary oncologist, Dr. Truitt Merle, for evaluation of worsening mental status dehydration and to rule out infection. The patient was started on avastin a week ago.  The patient's wife, in the past 2 days, patient has been getting progressively worse mental status. He has a tendency to get dehydrated and to waste potassium. His oral intake has been decreased. His headache has been worsening as well. To her knowledge, she denies fever, chills, but the patient has been very tired and fatigued lately. Patient has had occasional productive cough, but no nausea, emesis, diarrhea or constipation.  When seen in his room, the patient was confused, but in no acute distress. He answers simple questions, but was unable to provide further details." Daily update, Procedures: 10/08/2015 CT scan shows worsening midline shift, patient was transitioned to hospice care. Consultants: Palliative care Antibiotics: Anti-infectives    Start     Dose/Rate Route Frequency Ordered Stop  10/07/15 2000  azithromycin (ZITHROMAX) 500 mg in dextrose 5 % 250 mL IVPB     500 mg 250 mL/hr over 60 Minutes Intravenous Every 24 hours 10/07/15 1846     10/07/15 2000  vancomycin (VANCOCIN) IVPB 1000 mg/200 mL premix     1,000 mg 200 mL/hr over 60 Minutes Intravenous Every 8 hours 10/07/15 1916     10/07/15 1930  cefTRIAXone (ROCEPHIN) 1 g in dextrose 5 % 50 mL IVPB     1 g 100 mL/hr over 30 Minutes Intravenous Every 24 hours 10/07/15 1916     10/06/15 2300  levofloxacin (LEVAQUIN) IVPB 750 mg  Status:  Discontinued     750 mg 100 mL/hr over 90 Minutes Intravenous Daily at bedtime 10/06/15 2258 10/07/15 1846      Family Communication: family was present at bedside, at the time of interview.  All questions were answered satisfactorily  Disposition:  Barriers to safe discharge: Hospice placement   Intake/Output Summary (Last 24 hours) at 10/08/15 1700 Last data filed at 10/08/15 0634  Gross per 24 hour  Intake    940 ml  Output      0 ml  Net    940 ml   Filed Weights   10/06/15 1648  Weight: 90.719 kg (200 lb)    Objective: Physical Exam: Filed Vitals:   10/07/15 2315 10/07/15 2346 10/08/15 0422 10/08/15 1530  BP: 146/107 150/80 156/87 129/79  Pulse: 80  81 72  Temp: 97.7 F (36.5 C)  98.1 F (36.7 C) 98.6 F (37 C)  TempSrc: Oral  Oral Oral  Resp: 12  16 18   Height:      Weight:      SpO2: 92%  94% 93%     General: Appear in mild distress, no Rash; Oral Mucosa moist. Cardiovascular: S1 and S2 Present, no Murmur, no JVD Respiratory: Bilateral Air entry present and Clear to Auscultation, no Crackles, no wheezes Abdomen: Bowel Sound present, Soft and no tenderness Extremities: no Pedal edema, no calf tenderness Neurology: Increased lethargy with left-sided weakness  Data Reviewed: CBC:  Recent Labs Lab 10/06/15 1936 10/08/15 0546  WBC 7.2 9.4  NEUTROABS 6.6 8.5*  HGB 12.6* 11.2*  HCT 39.2 33.3*  MCV 91.2 89.3  PLT 125* 123456*   Basic Metabolic  Panel:  Recent Labs Lab 10/06/15 1413 10/06/15 1936 10/08/15 0546  NA 144 141 137  K 3.6 3.5 4.1  CL  --  110 109  CO2 20* 22 21*  GLUCOSE 139 162* 131*  BUN 26.6* 24* 19  CREATININE 0.6* 0.49* 0.50*  CALCIUM 9.2 8.5* 8.2*  MG  --  1.9  --    Liver Function Tests:  Recent Labs Lab 10/06/15 1413 10/06/15 1936 10/08/15 0546  AST 77* 87* 69*  ALT 104* 107* 90*  ALKPHOS 151* 137* 109  BILITOT 0.74 0.7 0.7  PROT 6.1* 5.8* 5.3*  ALBUMIN 2.9* 2.8* 2.6*   No results for input(s): LIPASE, AMYLASE in the last 168 hours. No results for input(s): AMMONIA in the last 168 hours.  Cardiac Enzymes:  Recent Labs Lab 10/06/15 0058 10/07/15 0524 10/07/15 1100  TROPONINI 0.04* 0.04* 0.03    BNP (last 3 results)  Recent Labs  10/06/15 2300  BNP 96.5    CBG:  Recent Labs Lab 10/08/15 0733  GLUCAP 95    Recent Results (from the past 240 hour(s))  Culture, blood (x 2)     Status: None (Preliminary result)   Collection Time: 10/06/15  7:55 PM  Result Value Ref Range Status   Specimen Description BLOOD RIGHT ARM  Final   Special Requests BOTTLES DRAWN AEROBIC AND ANAEROBIC 10CC  Final   Culture   Final    NO GROWTH 2 DAYS Performed at [facility]    Report Status PENDING  Incomplete  Culture, blood (x 2)     Status: None (Preliminary result)   Collection Time: 10/06/15  8:05 PM  Result Value Ref Range Status   Specimen Description BLOOD LEFT ARM  Final   Special Requests BOTTLES DRAWN AEROBIC AND ANAEROBIC 10CC  Final   Culture   Final    NO GROWTH 2 DAYS Performed at [facility]    Report Status PENDING  Incomplete     Studies: Ct Head Wo Contrast  10/08/2015  CLINICAL DATA:  Brain tumor.  Left-sided weakness EXAM: CT HEAD WITHOUT CONTRAST TECHNIQUE: Contiguous axial images were obtained from the base of the skull through the vertex without intravenous contrast. COMPARISON:  CT 09/19/2015 FINDINGS: Infiltrating low-density mass is seen  throughout the right posterior temporal, parietal, and occipital lobes. Increased low-density edema/ tumor in the right temporal lobe and right basal ganglia. There is extensive low-density enlarging the splenium of the corpus callosum which is unchanged. Dystrophic calcifications are present within the mass as noted previously. Mass-effect on the right lateral ventricle which is compressed. 13 mm midline shift to the left shows mild progression in the interval. The ventricles are not enlarged. No acute hemorrhage. No acute infarct. Opacification of the right sphenoid sinus with mucosal thickening in the paranasal sinuses. Craniotomy changes in the right parietal region noted. IMPRESSION: Diffuse low-density mass in the right posterior cerebral hemisphere with dystrophic calcification. Increased low-density edema/tumor in the right temporal lobe and right basal ganglia. Increased mass-effect and midline shift now measuring 13 mm. No associated acute hemorrhage. Electronically Signed   By: Franchot Gallo M.D.   On: 10/08/2015 10:27     Scheduled Meds: . azithromycin  500 mg Intravenous Q24H  . cefTRIAXone (ROCEPHIN)  IV  1 g Intravenous Q24H  . chlorhexidine  15 mL Mouth/Throat QID  . dexamethasone  4 mg Intravenous 3 times per day  . nystatin  5 mL Oral QID  . sodium chloride flush  3 mL Intravenous Q12H  . vancomycin  1,000 mg Intravenous Q8H   Continuous Infusions: . sodium chloride 50 mL (10/08/15 1650)   PRN Meds: acetaminophen **OR** acetaminophen, antiseptic oral rinse, glycopyrrolate **OR** glycopyrrolate **OR** glycopyrrolate, LORazepam **OR** LORazepam **OR** LORazepam, LORazepam, morphine injection, morphine CONCENTRATE **OR** morphine CONCENTRATE, ondansetron **OR** ondansetron (ZOFRAN) IV, polyvinyl alcohol  Time spent: 30 minutes  Author: Berle Mull, MD [facility] Pager: [phone] 10/08/2015 5:00 PM  If 7PM-7AM, please contact night-coverage at [email or web address], password  [facility]

## 2015-10-08 NOTE — Consult Note (Signed)
Consultation Note Date: 10/08/2015   Patient Name: Jake [name]  DOB: 1956-01-09  MRN: [id number]  Age / Sex: 60 y.o., male  PCP: Truitt Merle, MD Referring Physician: Lavina Hamman, MD  Reason for Consultation: Hospice Evaluation, Inpatient hospice referral, Non pain symptom management, Pain control and Terminal Care  Clinical Assessment/Narrative: Jake [name] is a 60 y.o. male with a past medical history of hypothyroidism, hypertension, allergies, arthritis, sleep apnea, urolithiasis, recurring gioblastoma multiforme of occipital lobe who has been enrolled in clinical trial at [place] who was admitted after being seen an oncologist office with rapid deterioration in his physical symptoms.  Dr. Posey Pronto met with family this morning and focus moving forward will be on ensuring comfort. I was consulted to help with this transition as well as discuss possible inpatient hospice placement.  I met with the patient and his wife today. He was largely somnolent and only awoke long enough to say that he was hurting in his right foot. He said yes when asked if he needed more pain medication. His wife reports that he has had a significant decline in his cognition, nutrition, and functional status over the past few days. He has not been eating and taking only small sips at best.  His wife reports that she understands that he in a different place than he was when I saw them during a recent admission approximately 2 weeks ago.  She states that he wants to ensure that he is comfortable moving forward and understands that he has little time left. She states that she spoke with Dr. Burr Medico about this to some extent when he was seen in the office on Friday.  She is also very concerned about making arrangements and also how she will cope emotionally as well as financially once her husband dies.  Contacts/Participants in Discussion: Patient (largely  somnolent) and his wife Primary Decision Maker: [name] Beharry   Relationship to Patient wife HCPOA: none on chart  SUMMARY OF RECOMMENDATIONS - Patient is DNR/DNI and focus moving forward is on comfort. - Family is interested in pursuing placement at [facility] - His wife will really need a lot of social support as well as guidance as her husband approaches the end of his life. They will really benefit from the interdisciplinary approach that hospice can provide including guidance from hospice social workers.  Code Status/Advance Care Planning: DNR    Code Status Orders        Start     Ordered   10/08/15 1119  Do not attempt resuscitation (DNR)   Continuous    Question Answer Comment  In the event of cardiac or respiratory ARREST Do not call a "code blue"   In the event of cardiac or respiratory ARREST Do not perform Intubation, CPR, defibrillation or ACLS   In the event of cardiac or respiratory ARREST Use medication by any route, position, wound care, and other measures to relive pain and suffering. May use oxygen, suction and manual treatment of airway obstruction as needed for comfort.      10/08/15 1122    Code Status History    Date Active Date Inactive Code Status Order ID Comments User Context   10/06/2015  6:32 PM 10/08/2015 11:22 AM Full Code [id number]  Reubin Milan, MD Inpatient   09/20/2015  3:04 AM 09/22/2015  8:19 PM Full Code [id number]  Rise Patience, MD ED   07/03/2015  4:28 PM 07/04/2015  3:56 PM Full Code [id number]  Robbie Lis,  MD ED   07/07/2014 12:49 PM 07/09/2014  5:22 PM Full Code [id number]  Elaina Hoops, MD Inpatient   07/02/2014  8:54 PM 07/07/2014 12:49 PM Full Code [id number]  Cordelia Poche, MD Inpatient     Symptom Management:  Pain: Morphine as needed Anxiety: Lorazepam as needed Nausea: Ondansetron as needed Excess secretions: Robinul as needed  Palliative Prophylaxis:   Aspiration, Bowel Regimen and Frequent Pain  Assessment  Psycho-social/Spiritual:  Support System: [place] Desire for further Chaplaincy support: Denies need this time. "I just want to be alone with him for a while." Additional Recommendations: Education on Hospice, Grief/Bereavement Support, Medicaid/Financial Assistance and Referral to [employer]   Prognosis: < 2 weeks. Jake [name] has glioblastoma that continues aggressive progression with increase in midline shift (now 13 mm) since last imaging approximately 2 weeks ago.  Has had a dramatic decline in his cognition, nutrition, and functional status. He is currently not taking much by mouth. He has been receiving IV medications for his comfort.  I would anticipate his prognosis to be less than 2 weeks and he would best be served by placement in residential hospice facility for end-of-life care.  Discharge Planning: Hospice facility. Patient's wife is hopeful for placement at [facility] for end-of-life care.   Chief Complaint/ Primary Diagnoses: Present on Admission:  . Hypothyroidism . GBM (glioblastoma multiforme) ([place]) . Essential hypertension . Glioblastoma ([place]) . Headache . Acute encephalopathy . HCAP (healthcare-associated pneumonia)  I have reviewed the medical record, interviewed the patient and family, and examined the patient. The following aspects are pertinent.  Past Medical History  Diagnosis Date  . Thyroid disease   . Hypertension   . Allergy   . Arthritis   . Sleep apnea   . Kidney stone   . Glioblastoma multiforme of occipital lobe ([place])   . Nocturnal leg cramps 09/29/2014  . History of radiation therapy 08/03/14- 09/15/14    right occipito-parietal brain/46 Gy 23 fx, right brain boost/ 14 Gy in 7 fx   Social History   Social History  . Marital Status: Married    Spouse Name: N/A  . Number of Children: 5  . Years of Education: college   Social History Main Topics  . Smoking status: Current Every Day Smoker -- 0.50 packs/day for 45 years     Types: Cigarettes  . Smokeless tobacco: Never Used  . Alcohol Use: 0.0 oz/week    0 Standard drinks or equivalent per week     Comment: 2-3 beers per/day   . Drug Use: No  . Sexual Activity: Not Currently   Other Topics Concern  . None   Social History Narrative   Patient is right handed.   Patient drinks 2 cups daily      Family History  Problem Relation Age of Onset  . Hypertension Mother   . Diabetes Father   . Glaucoma Father   . Diabetes Brother   . Diabetes Brother   . Colon cancer Neg Hx    Scheduled Meds: . azithromycin  500 mg Intravenous Q24H  . cefTRIAXone (ROCEPHIN)  IV  1 g Intravenous Q24H  . chlorhexidine  15 mL Mouth/Throat QID  . dexamethasone  4 mg Intravenous 3 times per day  . nystatin  5 mL Oral QID  . sodium chloride flush  3 mL Intravenous Q12H  . vancomycin  1,000 mg Intravenous Q8H   Continuous Infusions: . sodium chloride 50 mL (10/08/15 1230)   PRN Meds:.acetaminophen **OR** acetaminophen, antiseptic oral  rinse, glycopyrrolate **OR** glycopyrrolate **OR** glycopyrrolate, LORazepam **OR** LORazepam **OR** LORazepam, LORazepam, morphine injection, morphine CONCENTRATE **OR** morphine CONCENTRATE, ondansetron **OR** ondansetron (ZOFRAN) IV, polyvinyl alcohol Medications Prior to Admission:  Prior to Admission medications   Medication Sig Start Date End Date Taking? Authorizing Provider  acetaminophen (TYLENOL) 325 MG tablet Take 2 tablets (650 mg total) by mouth every 6 (six) hours as needed for mild pain (or Fever >/= 101). 07/04/15  Yes Robbie Lis, MD  amitriptyline (ELAVIL) 25 MG tablet Take 25 mg by mouth at bedtime.  06/30/15  Yes Historical Provider, MD  butalbital-acetaminophen-caffeine (FIORICET WITH CODEINE) 50-325-40-30 MG capsule Take 1 capsule by mouth every 4 (four) hours as needed for headache.   Yes Historical Provider, MD  calcium carbonate (TUMS EX) 750 MG chewable tablet Chew 2 tablets by mouth 2 (two) times daily. Reported on  09/27/2015   Yes Historical Provider, MD  chlorhexidine (PERIDEX) 0.12 % solution Rinse with 15 mls three daily for 30 seconds. Use after breakfast, lunch and at bedtime. Spit out excess. Do not swallow. 05/10/15  Yes Lenn Cal, DDS  cyclobenzaprine (FLEXERIL) 10 MG tablet Take 1 tablet (10 mg total) by mouth 3 (three) times daily as needed for muscle spasms. 09/22/15  Yes Velvet Bathe, MD  dexamethasone (DECADRON) 4 MG tablet Take 1 tablet (4 mg total) by mouth 3 (three) times daily. 09/27/15  Yes Laurie Panda, NP  docusate sodium (COLACE) 100 MG capsule Take 100 mg by mouth daily.    Yes Historical Provider, MD  HYDROcodone-acetaminophen (NORCO/VICODIN) 5-325 MG tablet Take 1 tablet by mouth every 4 (four) hours as needed for moderate pain. Do not exceed 4gm of Tylenol in 24 hours 09/25/15  Yes Tiffany L Reed, DO  LACTOBACILLUS PO Take 1 capsule by mouth daily.   Yes Historical Provider, MD  levothyroxine (SYNTHROID, LEVOTHROID) 25 MCG tablet Take 25 mcg by mouth daily before breakfast.   Yes Historical Provider, MD  lisinopril (PRINIVIL,ZESTRIL) 40 MG tablet Take 1 tablet (40 mg total) by mouth daily. 03/04/15  Yes Shawnee Knapp, MD  LORazepam (ATIVAN) 0.5 MG tablet take 1  tablet by mouth every 8 hours if needed for ANXIETY, NAUSEA, OR SLEEP 09/22/15  Yes Velvet Bathe, MD  Multiple Vitamins-Minerals (MULTIVITAMIN WITH MINERALS) tablet Take 1 tablet by mouth daily.   Yes Historical Provider, MD  NEXIUM 24HR 20 MG capsule Take 20 mg by mouth daily.  10/21/14  Yes Historical Provider, MD  NON FORMULARY Take 90 mLs by mouth 3 (three) times daily. Med Pass tid   Yes Historical Provider, MD  nystatin (MYCOSTATIN) 100000 UNIT/ML suspension Take 5 mLs (500,000 Units total) by mouth 4 (four) times daily. 07/07/15  Yes Truitt Merle, MD  potassium chloride SA (K-DUR,KLOR-CON) 20 MEQ tablet Take 20 mEq by mouth daily. X 4 days-stop 10/09/15 10/06/15  Yes Historical Provider, MD  Protein (PROCEL PO) Take by mouth 2  (two) times daily. 2 scoops bid   Yes Historical Provider, MD  senna (SENOKOT) 8.6 MG tablet Take 1 tablet (8.6 mg total) by mouth daily as needed for constipation. 09/22/15  Yes Velvet Bathe, MD  verapamil (COVERA HS) 180 MG (CO) 24 hr tablet Take 1 tablet (180 mg total) by mouth at bedtime. 03/04/15  Yes Shawnee Knapp, MD   Allergies  Allergen Reactions  . Other Nausea And Vomiting    Steroids - cause cramping all through the body - Headaches    Review of Systems  Unable to  obtain due to somnolence  Physical Exam  General: Ill-appearing male lying in bed, somnolent, does not open eyes during encounter, only verbalization is to say his foot hurts Cardiovascular: S1 and S2 Present, no Murmur, no JVD Respiratory: Fair air movement, no Crackles, no wheezes Abdomen: + Bowel Sound, Soft and no tenderness Extremities: no Pedal edema Neurology:  lethargic  Vital Signs: BP 156/87 mmHg  Pulse 81  Temp(Src) 98.1 F (36.7 C) (Oral)  Resp 16  Ht '5\' 11"'  (1.803 m)  Wt 90.719 kg (200 lb)  BMI 27.91 kg/m2  SpO2 94%  SpO2: SpO2: 94 % O2 Device:SpO2: 94 % O2 Flow Rate: .O2 Flow Rate (L/min): 2 L/min  IO: Intake/output summary:  Intake/Output Summary (Last 24 hours) at 10/08/15 1552 Last data filed at 10/08/15 8469  Gross per 24 hour  Intake    940 ml  Output      0 ml  Net    940 ml    LBM: Last BM Date: 10/07/15 Baseline Weight: Weight: 90.719 kg (200 lb) Most recent weight: Weight: 90.719 kg (200 lb)      Palliative Assessment/Data:  Flowsheet Rows        Most Recent Value   Intake Tab    Referral Department  Hospitalist   Unit at Time of Referral  Oncology Unit   Palliative Care Primary Diagnosis  Cancer   Date Notified  10/08/15   Palliative Care Type  Return patient Palliative Care   Reason for referral  Clarify Goals of Care, Counsel Regarding Hospice, End of [place]   Date of Admission  10/07/15   Date first seen by Palliative Care  10/08/15   # of days  Palliative referral response time  0 Day(s)   # of days IP prior to Palliative referral  1   Clinical Assessment    Palliative Performance Scale Score  20%   Pain Max last 24 hours  10   Pain Min Last 24 hours  Not able to report   Psychosocial & Spiritual Assessment    Palliative Care Outcomes    Patient/Family meeting held?  Yes   Who was at the meeting?  Patient's wife   Palliative Care Outcomes  Counseled regarding hospice, Provided end of life care assistance, Transitioned to hospice      Additional Data Reviewed:  CBC:    Component Value Date/Time   WBC 9.4 10/08/2015 0546   WBC 8.3 09/27/2015 1004   WBC 5.0 01/31/2013 1257   HGB 11.2* 10/08/2015 0546   HGB 12.8* 09/27/2015 1004   HGB 13.1* 01/31/2013 1257   HCT 33.3* 10/08/2015 0546   HCT 38.7 09/27/2015 1004   HCT 42.1* 01/31/2013 1257   PLT 119* 10/08/2015 0546   PLT 148 09/27/2015 1004   MCV 89.3 10/08/2015 0546   MCV 89.0 09/27/2015 1004   MCV 94.3 01/31/2013 1257   NEUTROABS 8.5* 10/08/2015 0546   NEUTROABS 7.3* 09/27/2015 1004   LYMPHSABS 0.7 10/08/2015 0546   LYMPHSABS 0.7* 09/27/2015 1004   MONOABS 0.2 10/08/2015 0546   MONOABS 0.2 09/27/2015 1004   EOSABS 0.0 10/08/2015 0546   EOSABS 0.0 09/27/2015 1004   BASOSABS 0.0 10/08/2015 0546   BASOSABS 0.0 09/27/2015 1004   Comprehensive Metabolic Panel:    Component Value Date/Time   NA 137 10/08/2015 0546   NA 144 10/06/2015 1413   K 4.1 10/08/2015 0546   K 3.6 10/06/2015 1413   CL 109 10/08/2015 0546   CO2 21* 10/08/2015  0546   CO2 20* 10/06/2015 1413   BUN 19 10/08/2015 0546   BUN 26.6* 10/06/2015 1413   CREATININE 0.50* 10/08/2015 0546   CREATININE 0.6* 10/06/2015 1413   CREATININE 0.86 01/25/2014 1015   GLUCOSE 131* 10/08/2015 0546   GLUCOSE 139 10/06/2015 1413   CALCIUM 8.2* 10/08/2015 0546   CALCIUM 9.2 10/06/2015 1413   AST 69* 10/08/2015 0546   AST 77* 10/06/2015 1413   ALT 90* 10/08/2015 0546   ALT 104* 10/06/2015 1413   ALKPHOS  109 10/08/2015 0546   ALKPHOS 151* 10/06/2015 1413   BILITOT 0.7 10/08/2015 0546   BILITOT 0.74 10/06/2015 1413   PROT 5.3* 10/08/2015 0546   PROT 6.1* 10/06/2015 1413   ALBUMIN 2.6* 10/08/2015 0546   ALBUMIN 2.9* 10/06/2015 1413     Time In: 1105 Time Out: 1225 Time Total: 80 Greater than 50%  of this time was spent counseling and coordinating care related to the above assessment and plan.  Signed by: Micheline Rough, MD  Micheline Rough, MD  10/08/2015, 3:52 PM  Please contact Palliative Medicine Team phone at [phone] for questions and concerns.

## 2015-10-09 DIAGNOSIS — J189 Pneumonia, unspecified organism: Principal | ICD-10-CM

## 2015-10-09 DIAGNOSIS — E039 Hypothyroidism, unspecified: Secondary | ICD-10-CM

## 2015-10-09 DIAGNOSIS — I10 Essential (primary) hypertension: Secondary | ICD-10-CM

## 2015-10-09 DIAGNOSIS — G939 Disorder of brain, unspecified: Secondary | ICD-10-CM

## 2015-10-09 DIAGNOSIS — C719 Malignant neoplasm of brain, unspecified: Secondary | ICD-10-CM

## 2015-10-09 DIAGNOSIS — G9340 Encephalopathy, unspecified: Secondary | ICD-10-CM

## 2015-10-09 LAB — LEGIONELLA ANTIGEN, URINE

## 2015-10-09 MED ORDER — HYDROCODONE-ACETAMINOPHEN 5-325 MG PO TABS
1.0000 | ORAL_TABLET | ORAL | Status: DC | PRN
  Administered 2015-10-09 – 2015-10-10 (×2): 1 via ORAL
  Filled 2015-10-09 (×2): qty 1

## 2015-10-09 MED ORDER — CODEINE SULFATE 30 MG PO TABS
30.0000 mg | ORAL_TABLET | ORAL | Status: DC | PRN
  Administered 2015-10-09 – 2015-10-10 (×3): 30 mg via ORAL
  Filled 2015-10-09 (×3): qty 1

## 2015-10-09 MED ORDER — LEVOFLOXACIN 750 MG PO TABS
750.0000 mg | ORAL_TABLET | Freq: Every day | ORAL | Status: DC
Start: 2015-10-09 — End: 2015-10-10
  Administered 2015-10-09 – 2015-10-10 (×2): 750 mg via ORAL
  Filled 2015-10-09 (×2): qty 1

## 2015-10-09 MED ORDER — CYCLOBENZAPRINE HCL 10 MG PO TABS
10.0000 mg | ORAL_TABLET | Freq: Three times a day (TID) | ORAL | Status: DC | PRN
  Administered 2015-10-09: 10 mg via ORAL
  Filled 2015-10-09 (×2): qty 1

## 2015-10-09 MED ORDER — AMITRIPTYLINE HCL 25 MG PO TABS
25.0000 mg | ORAL_TABLET | Freq: Every day | ORAL | Status: DC
  Administered 2015-10-09: 25 mg via ORAL
  Filled 2015-10-09: qty 1

## 2015-10-09 MED ORDER — BUTALBITAL-APAP-CAFFEINE 50-325-40 MG PO TABS
1.0000 | ORAL_TABLET | ORAL | Status: DC | PRN
  Administered 2015-10-09 – 2015-10-10 (×3): 1 via ORAL
  Filled 2015-10-09 (×3): qty 1

## 2015-10-09 MED ORDER — DOCUSATE SODIUM 100 MG PO CAPS
100.0000 mg | ORAL_CAPSULE | Freq: Every day | ORAL | Status: DC
  Administered 2015-10-09 – 2015-10-10 (×2): 100 mg via ORAL
  Filled 2015-10-09 (×2): qty 1

## 2015-10-09 MED ORDER — DEXAMETHASONE SODIUM PHOSPHATE 4 MG/ML IJ SOLN
10.0000 mg | Freq: Three times a day (TID) | INTRAMUSCULAR | Status: DC
Start: 2015-10-09 — End: 2015-10-09

## 2015-10-09 MED ORDER — LORAZEPAM 0.5 MG PO TABS
0.5000 mg | ORAL_TABLET | ORAL | Status: DC | PRN
  Administered 2015-10-09 – 2015-10-10 (×2): 0.5 mg via ORAL
  Filled 2015-10-09 (×2): qty 1

## 2015-10-09 MED ORDER — BUTALBITAL-APAP-CAFF-COD 50-325-40-30 MG PO CAPS
1.0000 | ORAL_CAPSULE | ORAL | Status: DC | PRN
  Filled 2015-10-09: qty 1

## 2015-10-09 MED ORDER — DEXAMETHASONE SODIUM PHOSPHATE 4 MG/ML IJ SOLN
10.0000 mg | Freq: Four times a day (QID) | INTRAMUSCULAR | Status: DC
  Administered 2015-10-09 – 2015-10-10 (×4): 10 mg via INTRAVENOUS
  Filled 2015-10-09 (×4): qty 3

## 2015-10-09 MED ORDER — POLYETHYLENE GLYCOL 3350 17 G PO PACK
17.0000 g | PACK | Freq: Every day | ORAL | Status: DC
  Administered 2015-10-09 – 2015-10-10 (×2): 17 g via ORAL
  Filled 2015-10-09 (×2): qty 1

## 2015-10-09 MED ORDER — ACETAMINOPHEN 325 MG PO TABS: 650.0000 mg | ORAL_TABLET | Freq: Four times a day (QID) | ORAL | Status: DC | PRN

## 2015-10-09 MED ORDER — EYE WASH OPHTH SOLN
1.0000 [drp] | OPHTHALMIC | Status: DC | PRN
  Filled 2015-10-09: qty 118

## 2015-10-09 MED ORDER — SENNOSIDES-DOCUSATE SODIUM 8.6-50 MG PO TABS: 1.0000 | ORAL_TABLET | Freq: Every evening | ORAL | Status: DC | PRN

## 2015-10-09 MED ORDER — SENNA 8.6 MG PO TABS: 1.0000 | ORAL_TABLET | Freq: Every day | ORAL | Status: DC | PRN

## 2015-10-09 MED ORDER — PANTOPRAZOLE SODIUM 40 MG PO TBEC
40.0000 mg | DELAYED_RELEASE_TABLET | Freq: Every day | ORAL | Status: DC
  Administered 2015-10-09 – 2015-10-10 (×2): 40 mg via ORAL
  Filled 2015-10-09 (×2): qty 1

## 2015-10-09 NOTE — Progress Notes (Signed)
CSW received consult for residential hospice placement. CSW spoke with patient's wife, [name] at bedside to confirm that their first choice would be [facility]. CSW made referral to [facility]on [facility] at [facility] - unfortunately, no bed availability today - will check back tomorrow. CSW spoke with wife about backup facility - wife is hopeful that patient has improved. Per Dr. Posey Pronto, awaiting Dr. Ernestina Penna input.   CSW will check back.    Raynaldo Opitz, [facility] Clinical Social Worker cell #: [phone]

## 2015-10-09 NOTE — Progress Notes (Signed)
[facility] Progress Note  Patient: Jake [name] [id number]   PCP: Truitt Merle, MD DOB: Apr 12, 1956   DOA: 10/06/2015   DOS: 10/09/2015   Date of Service: the patient was seen and examined on 10/09/2015  Subjective: The patient has been much awake and oriented. Is able to get his needs related. After talking with oncology requesting to remain full code. Nutrition: Able to tolerate oral diet Activity: Mostly bedridden Last BM: 10/08/2015   Assessment and Plan: 1. Acute encephalopathy  Suspected healthcare associated pneumonia.  Presented with generalized weakness as well as confusion. He was felt to be dehydrated. Patient has history of glioblastoma and recent MRI shows worsening findings. Patient has chronic headache due to the same and is on Decadron. Patient is scheduled for trial medication at [facility] although due to his high requirement of the steroids it is unclear whether with patient qualify for the trial medication or not. Primary oncologist has recommended ruling out sepsis with the patient as well as possible palliative care consultation with hospice. Since blood cultures are negative and transition the patient on Levaquin to complete a 7 day treatment course.  2. Glioblastoma multiform. Rapidly progressive 13 mm midline shift with significant vasogenic edema.  Discussed with patient as well as patient's wife at bedside. After discussing with oncology, current recommendation from the oncology is to increase the Decadron to 10 mg every 6 hours and monitor patient's response. As per the discussion with the oncology the hospice care plan has been reversed and the patient will likely be discharged to skilled nursing facility. Physical therapy consultation has been placed. After discussing with the oncology the patient has decided to remain full code.  Although I continue to believe that given the presentation with worsening midline shift the patient's prognosis continues to  remain significantly poor with probable complication including brain herniation as well as seizures and increasing lethargy and metabolic encephalopathy, including aspiration events fall trauma or injury and sudden death. Palliative care was informed about recommendation from the oncology as well as patient. Social worker consulted for transition to skilled nursing facility Earlier orders were replaced and palliative care orders were discontinued.    Hypothyroidism Continue Synthroid.    Essential hypertension Blood pressure stable currently holding blood pressure medications.  DVT Prophylaxis: mechanical compression Nutrition: Aspiration precaution, regular diet Advance goals of care discussion: Full code  Brief Summary of Hospitalization:  HPI: As per the H and P dictated on admission, "Jake [name] is a 60 y.o. male with a past medical history of hypothyroidism, hypertension, allergies, arthritis, sleep apnea, urolithiasis, recurring gioblastoma multiforme of occipital lobe who was referred by his primary oncologist, Dr. Truitt Merle, for evaluation of worsening mental status dehydration and to rule out infection. The patient was started on avastin a week ago.  The patient's wife, in the past 2 days, patient has been getting progressively worse mental status. He has a tendency to get dehydrated and to waste potassium. His oral intake has been decreased. His headache has been worsening as well. To her knowledge, she denies fever, chills, but the patient has been very tired and fatigued lately. Patient has had occasional productive cough, but no nausea, emesis, diarrhea or constipation.  When seen in his room, the patient was confused, but in no acute distress. He answers simple questions, but was unable to provide further details." Daily update, Procedures: 10/08/2015 CT scan shows worsening midline shift, patient was transitioned to hospice care. Consultants: Palliative  care Antibiotics: Anti-infectives  Start     Dose/Rate Route Frequency Ordered Stop   10/09/15 1600  levofloxacin (LEVAQUIN) tablet 750 mg     750 mg Oral Daily 10/09/15 1451 10/14/15 0959   10/07/15 2000  azithromycin (ZITHROMAX) 500 mg in dextrose 5 % 250 mL IVPB  Status:  Discontinued     500 mg 250 mL/hr over 60 Minutes Intravenous Every 24 hours 10/07/15 1846 10/09/15 1422   10/07/15 2000  vancomycin (VANCOCIN) IVPB 1000 mg/200 mL premix  Status:  Discontinued     1,000 mg 200 mL/hr over 60 Minutes Intravenous Every 8 hours 10/07/15 1916 10/09/15 1422   10/07/15 1930  cefTRIAXone (ROCEPHIN) 1 g in dextrose 5 % 50 mL IVPB  Status:  Discontinued     1 g 100 mL/hr over 30 Minutes Intravenous Every 24 hours 10/07/15 1916 10/09/15 1422   10/06/15 2300  levofloxacin (LEVAQUIN) IVPB 750 mg  Status:  Discontinued     750 mg 100 mL/hr over 90 Minutes Intravenous Daily at bedtime 10/06/15 2258 10/07/15 1846      Family Communication: family was present at bedside, at the time of interview.  All questions were answered satisfactorily  Disposition:  Barriers to safe discharge: Skilled nursing facility placement   Intake/Output Summary (Last 24 hours) at 10/09/15 1949 Last data filed at 10/09/15 1359  Gross per 24 hour  Intake    570 ml  Output   1975 ml  Net  -1405 ml   Filed Weights   10/06/15 1648  Weight: 90.719 kg (200 lb)    Objective: Physical Exam: Filed Vitals:   10/08/15 0422 10/08/15 1530 10/08/15 2345 10/09/15 1359  BP: 156/87 129/79 128/80 131/82  Pulse: 81 72 76 93  Temp: 98.1 F (36.7 C) 98.6 F (37 C) 98.1 F (36.7 C) 98.2 F (36.8 C)  TempSrc: Oral Oral Oral Oral  Resp: 16 18 18 17   Height:      Weight:      SpO2: 94% 93% 92% 95%     General: Appear in mild distress, no Rash; Oral Mucosa moist. Cardiovascular: S1 and S2 Present, no Murmur, no JVD Respiratory: Bilateral Air entry present and Clear to Auscultation, no Crackles, no wheezes Abdomen:  Bowel Sound present, Soft and no tenderness Extremities: no Pedal edema, no calf tenderness Neurology: Less lethargy with left-sided weakness  Data Reviewed: CBC:  Recent Labs Lab 10/06/15 1936 10/08/15 0546  WBC 7.2 9.4  NEUTROABS 6.6 8.5*  HGB 12.6* 11.2*  HCT 39.2 33.3*  MCV 91.2 89.3  PLT 125* 123456*   Basic Metabolic Panel:  Recent Labs Lab 10/06/15 1413 10/06/15 1936 10/08/15 0546  NA 144 141 137  K 3.6 3.5 4.1  CL  --  110 109  CO2 20* 22 21*  GLUCOSE 139 162* 131*  BUN 26.6* 24* 19  CREATININE 0.6* 0.49* 0.50*  CALCIUM 9.2 8.5* 8.2*  MG  --  1.9  --    Liver Function Tests:  Recent Labs Lab 10/06/15 1413 10/06/15 1936 10/08/15 0546  AST 77* 87* 69*  ALT 104* 107* 90*  ALKPHOS 151* 137* 109  BILITOT 0.74 0.7 0.7  PROT 6.1* 5.8* 5.3*  ALBUMIN 2.9* 2.8* 2.6*   No results for input(s): LIPASE, AMYLASE in the last 168 hours. No results for input(s): AMMONIA in the last 168 hours.  Cardiac Enzymes:  Recent Labs Lab 10/06/15 0058 10/07/15 0524 10/07/15 1100  TROPONINI 0.04* 0.04* 0.03    BNP (last 3 results)  Recent Labs  10/06/15 2300  BNP 96.5    CBG:  Recent Labs Lab 10/08/15 0733  GLUCAP 95    Recent Results (from the past 240 hour(s))  Culture, blood (x 2)     Status: None (Preliminary result)   Collection Time: 10/06/15  7:55 PM  Result Value Ref Range Status   Specimen Description BLOOD RIGHT ARM  Final   Special Requests BOTTLES DRAWN AEROBIC AND ANAEROBIC 10CC  Final   Culture   Final    NO GROWTH 3 DAYS Performed at [facility]    Report Status PENDING  Incomplete  Culture, blood (x 2)     Status: None (Preliminary result)   Collection Time: 10/06/15  8:05 PM  Result Value Ref Range Status   Specimen Description BLOOD LEFT ARM  Final   Special Requests BOTTLES DRAWN AEROBIC AND ANAEROBIC 10CC  Final   Culture   Final    NO GROWTH 3 DAYS Performed at [facility]    Report Status PENDING   Incomplete     Studies: No results found.   Scheduled Meds: . amitriptyline  25 mg Oral QHS  . chlorhexidine  15 mL Mouth/Throat QID  . dexamethasone  10 mg Intravenous 4 times per day  . docusate sodium  100 mg Oral Daily  . levofloxacin  750 mg Oral Daily  . levothyroxine  25 mcg Oral QAC breakfast  . nystatin  5 mL Oral QID  . pantoprazole  40 mg Oral Daily  . polyethylene glycol  17 g Oral Daily  . sodium chloride flush  3 mL Intravenous Q12H   Continuous Infusions:   PRN Meds: [DISCONTINUED] acetaminophen **OR** acetaminophen, acetaminophen, antiseptic oral rinse, butalbital-acetaminophen-caffeine **AND** codeine, cyclobenzaprine, eye wash, HYDROcodone-acetaminophen, LORazepam, LORazepam, ondansetron **OR** ondansetron (ZOFRAN) IV, polyvinyl alcohol, senna, senna-docusate  Time spent: 30 minutes  Author: Berle Mull, MD [facility] Pager: [phone] 10/09/2015 7:49 PM  If 7PM-7AM, please contact night-coverage at [email or web address], password [facility]

## 2015-10-09 NOTE — Clinical Documentation Improvement (Signed)
Internal Medicine  Based on the clinical findings below, please document any associated diagnoses/conditions the patient has or may have.       Cerebral edema      Brain Herniation      Other      Clinically Undetermined  Supporting Information: ( as per notes) "Glioblastoma multiform.  Discussed with patient's wife at bedside. This morning the patient was more lethargic and also had pronounced weakness on the left side, urgent CAT scan was done which was positive for increasing midline shift as well as edema. "   CT Head 10/08/15 IMPRESSION: Diffuse low-density mass in the right posterior cerebral hemisphere with dystrophic calcification. Increased low-density edema/tumor in the right temporal lobe and right basal ganglia. Increased mass-effect and midline shift now measuring 13 mm.  Please update your documentation within the medical record to reflect your response to this query. Thank you.  Please exercise your independent, professional judgment when responding. A specific answer is not anticipated or expected.  Thank You, Alessandra Grout, RN, BSN, CCDS,Clinical Documentation Specialist:  [phone]  [phone]=[facility] [place] [facility]- Health Information Management

## 2015-10-09 NOTE — Progress Notes (Addendum)
Pharmacy Antibiotic Note  Jake [name] is a 60 y.o. male admitted on 10/06/2015 with generalized weakness, AMS, HCAP.  PMH includes end-stage glioblastoma.  CXR shows BL opacities = atypical infxn vs edema.  Pharmacy was initially consulted for vancomycin and ceftriaxone dosing, narrowed to Levaquin PO today to complete a total of 7 days.  Plan:  Levaquin 750mg  PO x5 days.  Dosage remains stable and need for further dosage adjustment appears unlikely at present.  Pharmacy will sign off at this time.  Please reconsult if a change in clinical status warrants re-evaluation of dosage.     Height: 5\' 11"  (180.3 cm) Weight: 200 lb (90.719 kg) IBW/kg (Calculated) : 75.3  Temp (24hrs), Avg:98.3 F (36.8 C), Min:98.1 F (36.7 C), Max:98.6 F (37 C)   Recent Labs Lab 10/06/15 1413 10/06/15 1936 10/06/15 2300 10/08/15 0546  WBC  --  7.2  --  9.4  CREATININE 0.6* 0.49*  --  0.50*  LATICACIDVEN  --  1.4 0.9  --     Estimated Creatinine Clearance: 113.2 mL/min (by C-G formula based on Cr of 0.5).    Allergies  Allergen Reactions  . Other Nausea And Vomiting    Steroids - cause cramping all through the body - Headaches    Antimicrobials this admission: Levofloxacin 2/4 >> 2/4 Azithromycin 2/4 >> 2/6 Ceftriaxone 2/4 >> 2/6 Vancomycin 2/4 >> 2/6 Levaquin 2/6 >> (2/10)  Dose adjustments this admission: None  Microbiology results: 2/3 BCx: ngtd   Thank you for allowing pharmacy to be a part of this patient's care.  Gretta Arab PharmD, BCPS Pager [phone] 10/09/2015 2:44 PM

## 2015-10-09 NOTE — Progress Notes (Signed)
NUTRITION NOTE  MST score of 2.0 indicating unable to obtain information from pt on admission related to recent appetite and weight trends. Palliative Care team met with pt and his wife yesterday. Per that note, wife interested in keeping pt comfortable and is understanding of poor prognosis, limited life expectancy. Also per notes, wife stated that pt was eating bites and taking sips in the days PTA which was a drastic change for him in a short amount of time. Did not feel it was necessary to enter pt's room at this time; will continue to monitor for POC/GOC and associated needs.   Wt Readings from Last 15 Encounters:  10/06/15 200 lb (90.719 kg)  09/20/15 207 lb 10.8 oz (94.2 kg)  07/07/15 235 lb 8 oz (106.822 kg)  07/04/15 240 lb 12.8 oz (109.226 kg)  06/16/15 238 lb 9.6 oz (108.228 kg)  05/26/15 245 lb 14.4 oz (111.54 kg)  05/12/15 250 lb 1.6 oz (113.445 kg)  05/01/15 250 lb 9.6 oz (113.671 kg)  04/28/15 250 lb 14.4 oz (113.807 kg)  04/27/15 251 lb (113.853 kg)  04/14/15 251 lb (113.853 kg)  03/31/15 248 lb 11.2 oz (112.81 kg)  03/17/15 251 lb 12.8 oz (114.216 kg)  03/15/15 253 lb 14.4 oz (115.168 kg)  03/10/15 252 lb (114.306 kg)    Body mass index is 27.91 kg/(m^2). Patient meets criteria for overweight based on current BMI.   Per chart review, pt has lost 28 lbs (12% body weight) in the past 2.5 months which is significant for time frame.  No nutrition interventions warranted at this time. If nutrition issues arise, please consult RD.     Jarome Matin, RD, LDN Inpatient Clinical Dietitian Pager # [phone] After hours/weekend pager # [phone]

## 2015-10-10 LAB — BASIC METABOLIC PANEL
Anion gap: 9 (ref 5–15)
BUN: 18 mg/dL (ref 6–20)
CHLORIDE: 110 mmol/L (ref 101–111)
CO2: 21 mmol/L — AB (ref 22–32)
CREATININE: 0.57 mg/dL — AB (ref 0.61–1.24)
Calcium: 8.8 mg/dL — ABNORMAL LOW (ref 8.9–10.3)
GFR calc Af Amer: >60 mL/min (ref >60)
GFR calc non Af Amer: >60 mL/min (ref >60)
GLUCOSE: 121 mg/dL — AB (ref 65–99)
POTASSIUM: 4 mmol/L (ref 3.5–5.1)
SODIUM: 140 mmol/L (ref 135–145)

## 2015-10-10 MED ORDER — EYE WASH OPHTH SOLN: 1.0000 [drp] | OPHTHALMIC | Status: DC | PRN

## 2015-10-10 MED ORDER — DEXAMETHASONE 4 MG PO TABS: 8.0000 mg | ORAL_TABLET | Freq: Four times a day (QID) | ORAL | Status: AC

## 2015-10-10 MED ORDER — LEVOFLOXACIN 750 MG PO TABS: 750.0000 mg | ORAL_TABLET | Freq: Every day | ORAL | Status: DC

## 2015-10-10 MED ORDER — POLYETHYLENE GLYCOL 3350 17 G PO PACK: 17.0000 g | PACK | Freq: Every day | ORAL | Status: DC

## 2015-10-10 MED ORDER — LORAZEPAM 0.5 MG PO TABS: 0.5000 mg | ORAL_TABLET | Freq: Every evening | ORAL | Status: DC | PRN

## 2015-10-10 MED ORDER — POLYVINYL ALCOHOL 1.4 % OP SOLN: 1.0000 [drp] | Freq: Four times a day (QID) | OPHTHALMIC | Status: DC | PRN

## 2015-10-10 NOTE — Evaluation (Signed)
Physical Therapy Evaluation Patient Details Name: Jake [name] MRN: [id number] DOB: September 07, 1955 Today's Date: 10/10/2015   History of Present Illness  60 y.o. male with history of recurrent glioblastoma multiforme (enrolled in clinical vaccine trial at [facility]). He has been at [facility] for rehabilitation and admitted 10/06/15 for acute encephalopathy.  CT head showed worsening edema and midline shift.  Clinical Impression  Pt admitted with above diagnosis. Pt currently with functional limitations due to the deficits listed below (see PT Problem List).  Pt will benefit from skilled PT to increase their independence and safety with mobility to allow discharge to the venue listed below.  Pt familiar to this PT from previous admission.  Pt presents with decreased mobility and increased weakness with L hemiparesis this session.  Pt unable to sit upright unsupported and required assist for repositioning upon return to supine.   Placed pillows to position L UE (for shoulder), float heels, and under one hip and explained to pt and spouse this was for preventing skin breakdown.     Follow Up Recommendations SNF;Supervision/Assistance - 24 hour    Equipment Recommendations  None recommended by PT    Recommendations for Other Services       Precautions / Restrictions Precautions Precautions: Fall Precaution Comments: changes in vision, usually very poor left visual field      Mobility  Bed Mobility Overal bed mobility: Needs Assistance;+2 for physical assistance Bed Mobility: Supine to Sit;Sit to Supine     Supine to sit: Total assist;+2 for physical assistance Sit to supine: Total assist;+2 for physical assistance   General bed mobility comments: pt attempting to assist however requiring assist for weakness  Transfers Overall transfer level:  (NT for safety)                  Ambulation/Gait                Stairs            Wheelchair Mobility    Modified  [place] (Stroke Patients Only)       Balance Overall balance assessment: History of Falls;Needs assistance   Sitting balance-Leahy Scale: Zero Sitting balance - Comments: requires external trunk support to remain upright                                     Pertinent Vitals/Pain Pain Assessment: No/denies pain    Home Living Family/patient expects to be discharged to:: Skilled nursing facility                 Additional Comments: from [facility] rehab    Prior Function Level of Independence: Needs assistance   Gait / Transfers Assistance Needed: has been using lift at SNF since recent admission     Comments: assist for mobility, more recently mosly transfers and w/c, has had fall at SNF     Hand Dominance        Extremity/Trunk Assessment   Upper Extremity Assessment: LUE deficits/detail;Generalized weakness       LUE Deficits / Details: flaccid UE, poor grip   Lower Extremity Assessment: LLE deficits/detail;Generalized weakness   LLE Deficits / Details: grossly 2-/5 at best, no active movement against gravity     Communication   Communication: No difficulties  Cognition Arousal/Alertness: Awake/alert Behavior During Therapy:  (perseverating on smoking cigarettes) Overall Cognitive Status: Within Functional Limits for tasks assessed  General Comments      Exercises        Assessment/Plan    PT Assessment Patient needs continued PT services  PT Diagnosis Difficulty walking;Generalized weakness   PT Problem List Decreased strength;Decreased activity tolerance;Decreased balance;Decreased mobility;Decreased knowledge of use of DME  PT Treatment Interventions DME instruction;Functional mobility training;Patient/family education;Wheelchair mobility training;Therapeutic activities;Therapeutic exercise;Balance training;Neuromuscular re-education   PT Goals (Current goals can be found in the Care Plan section)  Acute Rehab PT Goals PT Goal Formulation: With patient/family Time For Goal Achievement: 10/24/15 Potential to Achieve Goals: Fair    Frequency Min 3X/week   Barriers to discharge        Co-evaluation               End of Session   Activity Tolerance: Patient limited by fatigue Patient left: with call bell/phone within reach;in bed;with bed alarm set;with nursing/sitter in room Nurse Communication: Need for lift equipment;Mobility status         Time: 1345-1404 PT Time Calculation (min) (ACUTE ONLY): 19 min   Charges:   PT Evaluation $PT Eval Moderate Complexity: 1 Procedure     PT G Codes:        Kamarah Bilotta,KATHrine E 10/10/2015, 2:22 PM Carmelia Bake, PT, DPT 10/10/2015 Pager: [phone]

## 2015-10-10 NOTE — Progress Notes (Addendum)
Jake [name]   DOB:07-29-1956   [id number]   [id number]  Subjective: pt was admitted from my office on 2/3. CT head showed worsening edema and midline shift. He still has intermittent headache, on morphine as needed for low back pain, no significant headaches, appetite is better, ate well for breakfast and lunch today.   Objective:  Filed Vitals:   10/09/15 1359 10/09/15 2042  BP: 131/82 131/80  Pulse: 93 77  Temp: 98.2 F (36.8 C) 98 F (36.7 C)  Resp: 17 18    Body mass index is 27.91 kg/(m^2).  Intake/Output Summary (Last 24 hours) at 10/10/15 0725 Last data filed at 10/09/15 2255  Gross per 24 hour  Intake    440 ml  Output    900 ml  Net   -460 ml     Sclerae unicteric  Oropharynx clear  No peripheral adenopathy  Lungs clear -- no rales or rhonchi  Heart regular rate and rhythm  Abdomen benign  MSK no focal spinal tenderness, no peripheral edema  Neuro nonfocal   CBG (last 3)   Recent Labs  10/08/15 0733  GLUCAP 95     Labs:  Lab Results  Component Value Date   WBC 9.4 10/08/2015   HGB 11.2* 10/08/2015   HCT 33.3* 10/08/2015   MCV 89.3 10/08/2015   PLT 119* 10/08/2015   NEUTROABS 8.5* 10/08/2015    @LASTCHEMISTRY @  Urine Studies No results for input(s): UHGB, CRYS in the last 72 hours.  Invalid input(s): UACOL, UAPR, USPG, UPH, UTP, UGL, UKET, UBIL, UNIT, UROB, [place], UEPI, UWBC, [place], [place], [place], [place], [place]  Basic Metabolic Panel:  Recent Labs Lab 10/06/15 1413  10/06/15 1936 10/08/15 0546 10/10/15 0523  NA 144  --  141 137 140  K 3.6  < > 3.5 4.1 4.0  CL  --   --  110 109 110  CO2 20*  --  22 21* 21*  GLUCOSE 139  --  162* 131* 121*  BUN 26.6*  --  24* 19 18  CREATININE 0.6*  --  0.49* 0.50* 0.57*  CALCIUM 9.2  --  8.5* 8.2* 8.8*  MG  --   --  1.9  --   --   < > = values in this interval not displayed. GFR Estimated Creatinine Clearance: 113.2 mL/min (by C-G formula based on Cr of 0.57). Liver Function Tests:  Recent  Labs Lab 10/06/15 1413 10/06/15 1936 10/08/15 0546  AST 77* 87* 69*  ALT 104* 107* 90*  ALKPHOS 151* 137* 109  BILITOT 0.74 0.7 0.7  PROT 6.1* 5.8* 5.3*  ALBUMIN 2.9* 2.8* 2.6*   No results for input(s): LIPASE, AMYLASE in the last 168 hours. No results for input(s): AMMONIA in the last 168 hours. Coagulation profile  Recent Labs Lab 10/06/15 1936  INR 0.94    CBC:  Recent Labs Lab 10/06/15 1936 10/08/15 0546  WBC 7.2 9.4  NEUTROABS 6.6 8.5*  HGB 12.6* 11.2*  HCT 39.2 33.3*  MCV 91.2 89.3  PLT 125* 119*   Cardiac Enzymes:  Recent Labs Lab 10/06/15 0058 10/07/15 0524 10/07/15 1100  TROPONINI 0.04* 0.04* 0.03   BNP: Invalid input(s): POCBNP CBG:  Recent Labs Lab 10/08/15 0733  GLUCAP 95   D-Dimer No results for input(s): DDIMER in the last 72 hours. Hgb A1c No results for input(s): HGBA1C in the last 72 hours. Lipid Profile No results for input(s): CHOL, HDL, LDLCALC, TRIG, CHOLHDL, LDLDIRECT in the last 72 hours. Thyroid function studies  No results for input(s): TSH, T4TOTAL, T3FREE, THYROIDAB in the last 72 hours.  Invalid input(s): FREET3 Anemia work up No results for input(s): VITAMINB12, FOLATE, FERRITIN, TIBC, IRON, RETICCTPCT in the last 72 hours. Microbiology Recent Results (from the past 240 hour(s))  Culture, blood (x 2)     Status: None (Preliminary result)   Collection Time: 10/06/15  7:55 PM  Result Value Ref Range Status   Specimen Description BLOOD RIGHT ARM  Final   Special Requests BOTTLES DRAWN AEROBIC AND ANAEROBIC 10CC  Final   Culture   Final    NO GROWTH 3 DAYS Performed at [facility]    Report Status PENDING  Incomplete  Culture, blood (x 2)     Status: None (Preliminary result)   Collection Time: 10/06/15  8:05 PM  Result Value Ref Range Status   Specimen Description BLOOD LEFT ARM  Final   Special Requests BOTTLES DRAWN AEROBIC AND ANAEROBIC 10CC  Final   Culture   Final    NO GROWTH 3 DAYS Performed  at [facility]    Report Status PENDING  Incomplete      Studies:  Ct Head Wo Contrast  10/08/2015  CLINICAL DATA:  Brain tumor.  Left-sided weakness EXAM: CT HEAD WITHOUT CONTRAST TECHNIQUE: Contiguous axial images were obtained from the base of the skull through the vertex without intravenous contrast. COMPARISON:  CT 09/19/2015 FINDINGS: Infiltrating low-density mass is seen throughout the right posterior temporal, parietal, and occipital lobes. Increased low-density edema/ tumor in the right temporal lobe and right basal ganglia. There is extensive low-density enlarging the splenium of the corpus callosum which is unchanged. Dystrophic calcifications are present within the mass as noted previously. Mass-effect on the right lateral ventricle which is compressed. 13 mm midline shift to the left shows mild progression in the interval. The ventricles are not enlarged. No acute hemorrhage. No acute infarct. Opacification of the right sphenoid sinus with mucosal thickening in the paranasal sinuses. Craniotomy changes in the right parietal region noted. IMPRESSION: Diffuse low-density mass in the right posterior cerebral hemisphere with dystrophic calcification. Increased low-density edema/tumor in the right temporal lobe and right basal ganglia. Increased mass-effect and midline shift now measuring 13 mm. No associated acute hemorrhage. Electronically Signed   By: Franchot Gallo M.D.   On: 10/08/2015 10:27   CXR 10/06/2015 IMPRESSION: 1. Bilateral interstitial opacities which may reflect atypical infection or edema.   Assessment: 60 y.o. male  1. Recurrent GBM, currently on a vaccine clinical trial at [facility] 2. Acute encephalopathy secondary to intracranial mass effect and midline shift 3. Health care associated pneumonia versus aspiration pneumonia 4. Hypertension, hypothyroidism 5. Deconditioning    Plan:  -I agree with broad antibiotics for his pneumonia, should cover aspiration  pneumonia also  -I spoke with Dr. Isaac Laud at [facility], she recommend to increase dexa to 10mg  every 6hrs to see if he improves  -spoke with pt's wife, she also wishes to try one more dose of Avastin which is scheduled for 2/15, to see if his condition improves. If not, she is agreeable to consider hospice -I discussed the above with Hospitalist Dr. Posey Pronto, who agrees -appreciate excellent care from the hospitalist team  -consider discharge back to the SNF he came from   I will follow as needed when he is in house, and see him back within one week after hospital discharge.    Truitt Merle, MD 10/09/2015 10pm

## 2015-10-10 NOTE — Discharge Summary (Addendum)
[facility] Discharge Summary   Patient: Jake [name] [id number]   PCP: Truitt Merle, MD DOB: 1956-04-20   Date of admission: 10/06/2015   Date of discharge: 10/10/2015    Discharge Diagnoses:  Principal Problem:   Acute encephalopathy Active Problems:   Hypothyroidism   Essential hypertension   GBM (glioblastoma multiforme) ([place])   Headache   Glioblastoma ([place])   HCAP (healthcare-associated pneumonia)   Left-sided weakness  Recommendations for Outpatient Follow-up:  1.  Please follow up with Dr. Burr Medico on 10/13/2015, please call the office for timing 2. Discharging at [place]    Follow up with Truitt Merle, MD. Go on 10/13/2015.   Specialties:  Hematology, Oncology   Why:  call for timing   Contact information:   [place] [id number] [id number]       Diet recommendation: Regular diet  Activity: The patient is advised to gradually reintroduce usual activities.  Discharge Condition: fair  History of present illness: As per the H and P dictated on admission, "Jake [name] is a 60 y.o. male with a past medical history of hypothyroidism, hypertension, allergies, arthritis, sleep apnea, urolithiasis, recurring gioblastoma multiforme of occipital lobe who was referred by his primary oncologist, Dr. Truitt Merle, for evaluation of worsening mental status dehydration and to rule out infection. The patient was started on avastin a week ago.  The patient's wife, in the past 2 days, patient has been getting progressively worse mental status. He has a tendency to get dehydrated and to waste potassium. His oral intake has been decreased. His headache has been worsening as well. To her knowledge, she denies fever, chills, but the patient has been very tired and fatigued lately. Patient has had occasional productive cough, but no nausea, emesis, diarrhea or constipation.  When seen in his room, the patient was confused, but in no acute  distress. He answers simple questions, but was unable to provide further details."  Hospital Course:  Summary of his active problems in the hospital is as following. 1. Acute encephalopathy  Suspected healthcare associated pneumonia.  Presented with generalized weakness as well as confusion. He was felt to be dehydrated. Chest x-ray shows bilateral interstitial opacities may reflect an atypical infection. Patient was started on broad-spectrum antibiotics ceftriaxone azithromycin and vancomycin. blood cultures are negative, urine culture was negative, streptococcal and Legionella antigen were also negative. transition the patient on Levaquin to complete a 7 day treatment course.  2. Recurrent Glioblastoma multiform. Is currently on clinical trial at [facility] Rapidly progressive 13 mm midline shift with worsening significant vasogenic edema. Currently recommendation from the medical oncology is to increase patient's Decadron, patient was given 10 mg every 6 hours on 10/09/2015. At the time of the discharge as per my discussion with Dr. Burr Medico, Cardizem dose will be reduced to 8 mg every 6 hours. Patient will follow-up with them and Friday for further workup. Patient may also get Avastin therapy based on recommendation from oncology as well as family request.  Discussed with patient as well as patient's wife at bedside.  As per the discussion with the oncology the hospice care plan has been reversed and the patient will be discharged to skilled nursing facility. After discussing with the oncology the patient has decided to remain full code.  Although I continue to believe that given the presentation with worsening midline shift, the patient's prognosis continues to remain significantly poor with probable complication including brain herniation as well as seizures and increasing  lethargy and metabolic encephalopathy, including aspiration events fall trauma or injury and sudden death. Palliative care  was informed about recommendation from the oncology as well as patient's wishes. Social worker consulted for transition to skilled nursing facility Earlier orders were replaced and palliative care orders were discontinued.  3. Hypothyroidism Continue Synthroid.  4. Essential hypertension Blood pressure stable currently holding blood pressure medications.  5. Anxiety as well as insomnia. Continue lorazepam every 8 hours. Placed extra order of lorazepam every afternoon as per request from patient's wife to help with insomnia.  6. Urinary retention. On admission the patient had significant urinary retention and Foley catheter was inserted. Catheter was discontinued on discharge.  7. Essential hypertension. Patient's blood pressure medications were on hold here in the hospital. Despite which his blood pressure remained stable. Next and verapamil was resumed on discharge but lisinopril was kept on hold as well as potassium.  All other chronic medical condition were stable during the hospitalization.  Patient was seen by physical therapy, who recommended SNF, which was arranged by Education officer, museum and case Freight forwarder. On the day of the discharge the patient's vital remained stable, and no other acute medical condition were reported by patient. the patient was felt safe to be discharge at [facility].  Procedures and Results:  none   Consultations:  Oncology, palliative care  DISCHARGE MEDICATION: Current Discharge Medication List    START taking these medications   Details  eye wash (,SODIUM/POTASSIUM/SOD CHLORIDE,) SOLN Place 1 drop into both eyes as needed for dry eyes. Qty: 1 Bottle, Refills: 0    levofloxacin (LEVAQUIN) 750 MG tablet Take 1 tablet (750 mg total) by mouth daily. Qty: 3 tablet, Refills: 0    !! LORazepam (ATIVAN) 0.5 MG tablet Take 1 tablet (0.5 mg total) by mouth at bedtime as needed for sleep. Qty: 30 tablet, Refills: 0    polyethylene glycol (MIRALAX / GLYCOLAX)  packet Take 17 g by mouth daily. Qty: 14 each, Refills: 0    polyvinyl alcohol (LIQUIFILM TEARS) 1.4 % ophthalmic solution Place 1 drop into both eyes 4 (four) times daily as needed for dry eyes. Qty: 15 mL, Refills: 0     !! - Potential duplicate medications found. Please discuss with provider.    CONTINUE these medications which have CHANGED   Details  dexamethasone (DECADRON) 4 MG tablet Take 2 tablets (8 mg total) by mouth 4 (four) times daily. Qty: 42 tablet, Refills: 0      CONTINUE these medications which have NOT CHANGED   Details  acetaminophen (TYLENOL) 325 MG tablet Take 2 tablets (650 mg total) by mouth every 6 (six) hours as needed for mild pain (or Fever >/= 101). Qty: 30 tablet, Refills: 0    amitriptyline (ELAVIL) 25 MG tablet Take 25 mg by mouth at bedtime.     butalbital-acetaminophen-caffeine (FIORICET WITH CODEINE) 50-325-40-30 MG capsule Take 1 capsule by mouth every 4 (four) hours as needed for headache.    calcium carbonate (TUMS EX) 750 MG chewable tablet Chew 2 tablets by mouth 2 (two) times daily. Reported on 09/27/2015    chlorhexidine (PERIDEX) 0.12 % solution Rinse with 15 mls three daily for 30 seconds. Use after breakfast, lunch and at bedtime. Spit out excess. Do not swallow. Qty: 1440 mL, Refills: prn    cyclobenzaprine (FLEXERIL) 10 MG tablet Take 1 tablet (10 mg total) by mouth 3 (three) times daily as needed for muscle spasms. Qty: 30 tablet, Refills: 0    docusate sodium (COLACE) 100  MG capsule Take 100 mg by mouth daily.     HYDROcodone-acetaminophen (NORCO/VICODIN) 5-325 MG tablet Take 1 tablet by mouth every 4 (four) hours as needed for moderate pain. Do not exceed 4gm of Tylenol in 24 hours Qty: 180 tablet, Refills: 0    LACTOBACILLUS PO Take 1 capsule by mouth daily.    levothyroxine (SYNTHROID, LEVOTHROID) 25 MCG tablet Take 25 mcg by mouth daily before breakfast.    !! LORazepam (ATIVAN) 0.5 MG tablet take 1  tablet by mouth every 8  hours if needed for ANXIETY, NAUSEA, OR SLEEP Qty: 90 tablet, Refills: 0    Multiple Vitamins-Minerals (MULTIVITAMIN WITH MINERALS) tablet Take 1 tablet by mouth daily.    NEXIUM 24HR 20 MG capsule Take 20 mg by mouth daily.  Refills: 0    NON FORMULARY Take 90 mLs by mouth 3 (three) times daily. Med Pass tid    nystatin (MYCOSTATIN) 100000 UNIT/ML suspension Take 5 mLs (500,000 Units total) by mouth 4 (four) times daily. Qty: 473 mL, Refills: 1    Protein (PROCEL PO) Take by mouth 2 (two) times daily. 2 scoops bid    senna (SENOKOT) 8.6 MG tablet Take 1 tablet (8.6 mg total) by mouth daily as needed for constipation.    verapamil (COVERA HS) 180 MG (CO) 24 hr tablet Take 1 tablet (180 mg total) by mouth at bedtime. Qty: 90 tablet, Refills: 3     !! - Potential duplicate medications found. Please discuss with provider.    STOP taking these medications     lisinopril (PRINIVIL,ZESTRIL) 40 MG tablet      potassium chloride SA (K-DUR,KLOR-CON) 20 MEQ tablet        Allergies  Allergen Reactions  . Other Nausea And Vomiting    Steroids - cause cramping all through the body - Headaches   Discharge Instructions    Diet - low sodium heart healthy    Complete by:  As directed      Increase activity slowly    Complete by:  As directed           Discharge Exam: Filed Weights   10/06/15 1648  Weight: 90.719 kg (200 lb)   Filed Vitals:   10/10/15 0723 10/10/15 1347  BP: 129/88 122/89  Pulse: 65 100  Temp: 97.6 F (36.4 C) 98.2 F (36.8 C)  Resp: 18 18   General: Appear in mild distress, no Rash; Oral Mucosa moist. Cardiovascular: S1 and S2 Present, no Murmur, no JVD Respiratory: Bilateral Air entry present and Clear to Auscultation, no Crackles, no wheezes Abdomen: Bowel Sound present, Soft and no tenderness Extremities: no Pedal edema, no calf tenderness Neurology: Left-sided weakness  The results of significant diagnostics from this hospitalization (including  imaging, microbiology, ancillary and laboratory) are listed below for reference.    Significant Diagnostic Studies: Ct Head Wo Contrast  10/08/2015  CLINICAL DATA:  Brain tumor.  Left-sided weakness EXAM: CT HEAD WITHOUT CONTRAST TECHNIQUE: Contiguous axial images were obtained from the base of the skull through the vertex without intravenous contrast. COMPARISON:  CT 09/19/2015 FINDINGS: Infiltrating low-density mass is seen throughout the right posterior temporal, parietal, and occipital lobes. Increased low-density edema/ tumor in the right temporal lobe and right basal ganglia. There is extensive low-density enlarging the splenium of the corpus callosum which is unchanged. Dystrophic calcifications are present within the mass as noted previously. Mass-effect on the right lateral ventricle which is compressed. 13 mm midline shift to the left shows mild progression in  the interval. The ventricles are not enlarged. No acute hemorrhage. No acute infarct. Opacification of the right sphenoid sinus with mucosal thickening in the paranasal sinuses. Craniotomy changes in the right parietal region noted. IMPRESSION: Diffuse low-density mass in the right posterior cerebral hemisphere with dystrophic calcification. Increased low-density edema/tumor in the right temporal lobe and right basal ganglia. Increased mass-effect and midline shift now measuring 13 mm. No associated acute hemorrhage. Electronically Signed   By: Franchot Gallo M.D.   On: 10/08/2015 10:27   Ct Head Wo Contrast  09/19/2015  CLINICAL DATA:  Glioblastoma.  Rule out acute hemorrhage. EXAM: CT HEAD WITHOUT CONTRAST TECHNIQUE: Contiguous axial images were obtained from the base of the skull through the vertex without intravenous contrast. COMPARISON:  MRI 07/03/2015.  CT 07/02/2015. FINDINGS: Large mass again noted in the posterior right cerebral hemisphere involving the right occipital lobe, parietal lobe, posterior temporal and frontal lobes. Areas  of high density within the mass are unchanged and likely reflect calcifications. No evidence of hemorrhage. The overall mass has extended to now involve and cross the posterior corpus callosum. Increasing right to left midline shift, now all 9 mm. Mass-effect on the right lateral ventricle. No hydrocephalus. IMPRESSION: Enlarging right posterior cerebral hemisphere mass, now crossing the posterior corpus callosum. No evidence for hemorrhage. Increasing right to left midline shift, now 9 mm. Electronically Signed   By: Rolm Baptise M.D.   On: 09/19/2015 18:56   Mr [name] [id number] Contrast  09/21/2015  CLINICAL DATA:  60 year old male with recurrent glioblastoma enrolled in clinical vaccine trial at [place] last month. Recently developed severe headache requiring increase pain medication. Subsequent encounter. EXAM: MRI HEAD WITHOUT AND WITH CONTRAST TECHNIQUE: Multiplanar, multiecho pulse sequences of the brain and surrounding structures were obtained without and with intravenous contrast. CONTRAST:  15mL MULTIHANCE GADOBENATE DIMEGLUMINE 529 MG/ML IV SOLN COMPARISON:  Head CT without contrast 09/19/2015. Restaging Brain MRI 07/03/2015. FINDINGS: Significant interval worsening of abnormal masslike T2 hyperintensity in the brain, now extensively affecting the splenium of the corpus callosum and tracking across midline (series 6, image 18), extending into the right deep gray matter nuclei (image 17) and farther anteriorly into the right temporal lobe (image 15). Associated diffusion signal abnormality also has significantly increased, with a confluent rim of restricted diffusion adjacent to the right atrium (series 400, image 31). Also, there is abnormal restricted diffusion in the splenium which is new (series 4, image 30). Subsequent increased leftward midline shift of 8-9 mm (4 mm previously). There might be early trapping of the left lateral ventricle, where the temporal and occipital horns are mildly increased (series  7, image 14). Following contrast there is less confluent enhancement in the right occipital pole (series 11, image 24), but increased stellate enhancement about the atrium of the right lateral ventricle (image 29). The lesion only crosses midline at the splenium, and the advancing edge of abnormal T2 hyperintensity and T1 hypo intensity does not enhance. Chronic blood products in the right occipital lobe are stable. Overlying post craniotomy changes. Basilar cisterns remain patent. Brainstem, cerebellum, cervicomedullary junction, and visualized cervical spine remain normal. Major intracranial vascular flow voids are stable. No diffusion abnormality to suggest acute infarction. No acute intracranial hemorrhage identified. Bone marrow signal remains normal. Mastoids and paranasal sinuses remain clear. Stable orbit and scalp soft tissues. IMPRESSION: Significant worsening of mass like T2 hyperintensity in the right hemisphere, with new extensive involvement of the splenium of the corpus callosum which tracks across midline to the  left. Subsequent increased intracranial mass effect with leftward midline shift now 8-9 mm (4 mm in October). Suspected early trapping of the left lateral ventricle. True progression of disease was strongly favored in October considering the patient had not yet begun new therapy at that time. However, if the patient has undergone interval vaccination/immune therapy at [facility] then there is a chance that these changes since October are treatment effect. No new intracranial abnormality. Electronically Signed   By: Genevie Ann M.D.   On: 09/21/2015 13:45   Dg Chest Port 1 View  10/06/2015  CLINICAL DATA:  Sepsis. Glioma within the occipital lobe. Decreased blood pressure. EXAM: PORTABLE CHEST 1 VIEW COMPARISON:  07/03/2015 FINDINGS: Heart size is normal. No pleural effusion identified. Bilateral interstitial opacities are identified which appear new from previous exam. No lobar consolidation.  IMPRESSION: 1. Bilateral interstitial opacities which may reflect atypical infection or edema. Electronically Signed   By: Kerby Moors M.D.   On: 10/06/2015 19:03    Microbiology: Recent Results (from the past 240 hour(s))  Culture, blood (x 2)     Status: None (Preliminary result)   Collection Time: 10/06/15  7:55 PM  Result Value Ref Range Status   Specimen Description BLOOD RIGHT ARM  Final   Special Requests BOTTLES DRAWN AEROBIC AND ANAEROBIC 10CC  Final   Culture   Final    NO GROWTH 4 DAYS Performed at [facility]    Report Status PENDING  Incomplete  Culture, blood (x 2)     Status: None (Preliminary result)   Collection Time: 10/06/15  8:05 PM  Result Value Ref Range Status   Specimen Description BLOOD LEFT ARM  Final   Special Requests BOTTLES DRAWN AEROBIC AND ANAEROBIC 10CC  Final   Culture   Final    NO GROWTH 4 DAYS Performed at [facility]    Report Status PENDING  Incomplete     Labs: CBC:  Recent Labs Lab 10/06/15 1936 10/08/15 0546  WBC 7.2 9.4  NEUTROABS 6.6 8.5*  HGB 12.6* 11.2*  HCT 39.2 33.3*  MCV 91.2 89.3  PLT 125* 123456*   Basic Metabolic Panel:  Recent Labs Lab 10/06/15 1413 10/06/15 1936 10/08/15 0546 10/10/15 0523  NA 144 141 137 140  K 3.6 3.5 4.1 4.0  CL  --  110 109 110  CO2 20* 22 21* 21*  GLUCOSE 139 162* 131* 121*  BUN 26.6* 24* 19 18  CREATININE 0.6* 0.49* 0.50* 0.57*  CALCIUM 9.2 8.5* 8.2* 8.8*  MG  --  1.9  --   --    Liver Function Tests:  Recent Labs Lab 10/06/15 1413 10/06/15 1936 10/08/15 0546  AST 77* 87* 69*  ALT 104* 107* 90*  ALKPHOS 151* 137* 109  BILITOT 0.74 0.7 0.7  PROT 6.1* 5.8* 5.3*  ALBUMIN 2.9* 2.8* 2.6*   No results for input(s): LIPASE, AMYLASE in the last 168 hours. No results for input(s): AMMONIA in the last 168 hours. Cardiac Enzymes:  Recent Labs Lab 10/06/15 0058 10/07/15 0524 10/07/15 1100  TROPONINI 0.04* 0.04* 0.03   BNP (last 3 results)  Recent  Labs  10/06/15 2300  BNP 96.5   CBG:  Recent Labs Lab 10/08/15 0733  GLUCAP 95   Time spent: 30 minutes  Signed:  PATEL, PRANAV  [facility] 10/10/2015, 2:49 PM

## 2015-10-10 NOTE — Progress Notes (Signed)
Report called to [facility]. Pt aware of D/C to facility. Transport via [employer].

## 2015-10-10 NOTE — Progress Notes (Signed)
Patient is set to discharge back to [facility] today. Patient & wife at bedside aware. Discharge packet given to RN, Catie. [facility] called for transport.     Raynaldo Opitz, [facility] Clinical Social Worker cell #: [phone]

## 2015-10-11 ENCOUNTER — Other Ambulatory Visit: Payer: Self-pay | Admitting: *Deleted

## 2015-10-11 ENCOUNTER — Telehealth: Payer: Self-pay | Admitting: *Deleted

## 2015-10-11 ENCOUNTER — Telehealth: Payer: Self-pay | Admitting: Hematology

## 2015-10-11 ENCOUNTER — Encounter: Payer: Self-pay | Admitting: Internal Medicine

## 2015-10-11 ENCOUNTER — Non-Acute Institutional Stay (SKILLED_NURSING_FACILITY): Payer: 59 | Admitting: Internal Medicine

## 2015-10-11 ENCOUNTER — Other Ambulatory Visit: Payer: Self-pay | Admitting: Hematology

## 2015-10-11 DIAGNOSIS — C719 Malignant neoplasm of brain, unspecified: Secondary | ICD-10-CM | POA: Diagnosis not present

## 2015-10-11 DIAGNOSIS — I10 Essential (primary) hypertension: Secondary | ICD-10-CM

## 2015-10-11 DIAGNOSIS — B370 Candidal stomatitis: Secondary | ICD-10-CM

## 2015-10-11 DIAGNOSIS — R5381 Other malaise: Secondary | ICD-10-CM

## 2015-10-11 DIAGNOSIS — J189 Pneumonia, unspecified organism: Secondary | ICD-10-CM | POA: Diagnosis not present

## 2015-10-11 DIAGNOSIS — R51 Headache: Secondary | ICD-10-CM

## 2015-10-11 DIAGNOSIS — R519 Headache, unspecified: Secondary | ICD-10-CM

## 2015-10-11 DIAGNOSIS — K5900 Constipation, unspecified: Secondary | ICD-10-CM | POA: Diagnosis not present

## 2015-10-11 DIAGNOSIS — E46 Unspecified protein-calorie malnutrition: Secondary | ICD-10-CM

## 2015-10-11 LAB — CULTURE, BLOOD (ROUTINE X 2)
CULTURE: NO GROWTH
Culture: NO GROWTH

## 2015-10-11 LAB — BASIC METABOLIC PANEL
BUN: 17 mg/dL (ref 4–21)
Creatinine: 0.5 mg/dL — AB (ref 0.6–1.3)
Glucose: 104 mg/dL
Potassium: 3.7 mmol/L (ref 3.4–5.3)
SODIUM: 140 mmol/L (ref 137–147)

## 2015-10-11 MED ORDER — BUTALBITAL-APAP-CAFF-COD 50-325-40-30 MG PO CAPS: ORAL_CAPSULE | ORAL | Status: DC

## 2015-10-11 MED ORDER — LORAZEPAM 0.5 MG PO TABS: 0.5000 mg | ORAL_TABLET | Freq: Every evening | ORAL | Status: DC | PRN

## 2015-10-11 MED ORDER — HYDROCODONE-ACETAMINOPHEN 5-325 MG PO TABS: 1.0000 | ORAL_TABLET | ORAL | Status: AC | PRN

## 2015-10-11 NOTE — Telephone Encounter (Signed)
Spoke to patient wife to confirm appointment date/time per 2/8 pof

## 2015-10-11 NOTE — Telephone Encounter (Signed)
[facility] 

## 2015-10-11 NOTE — Telephone Encounter (Signed)
Received calls earlier from [place], wife & [place] asking about appts for this week.  Discussed with Dr Burr Medico & appt scheduled for lab & MD for 10/13/15 @ 3pm/3/30 pm & notified [name] & Jessica @ [place] @ [phone].

## 2015-10-11 NOTE — Progress Notes (Signed)
Patient ID: Jake [name], male   DOB: [birth year], 60 y.o.   MRN: [id number]    LOCATION: [facility]  PCP: Truitt Merle, MD   Code Status: Full Code  Goals of care: Advanced Directive information Advanced Directives 10/06/2015  Does patient have an advance directive? No  Copy of advanced directive(s) in chart? -  Would patient like information on creating an advanced directive? No - patient declined information     Extended Emergency Contact Information Primary Emergency Contact: Mckinny,[name] Address: [place], [place] [address] [address] of [place] Phone: [phone] Mobile Phone: [phone] Relation: Spouse Secondary Emergency Contact: Marga Melnick Address: [place], [place] [address] [address]net[place] Litter of [place] Phone: [phone] Mobile Phone: [phone] Relation: Other   Allergies  Allergen Reactions  . Other Nausea And Vomiting    Steroids - cause cramping all through the body - Headaches    Chief Complaint  Patient presents with  . Readmit To SNF    ReAdmissions     HPI:  Patient is a 60 y.o. male seen today for short term rehabilitation post hospital re-admission from 10/06/15-10/10/15 with altered mental status. He was treated for HCAP with antibiotics and was given decadron for his glioblastoma multiforme on oncology recommendation. He has history of glioblastoma multiforme and is currently on a vaccine clinical trial at [facility] and on avastin. He is seen in his room today. He is alert and participates in conversation some. He denies any headache or pain at present.   Review of Systems:  Constitutional: Negative for fever, chills. Positive for generalized weakness HENT: Negative for congestion, difficulty swallowing.   Eyes: Negative for double vision and discharge.  Respiratory: Negative for cough, shortness of breath.   Cardiovascular: Negative for chest pain, palpitations, leg swelling.    Gastrointestinal: Negative for heartburn, nausea, vomiting, abdominal pain. Appetite is poor. Does not remember when he last had a bowel movement  Genitourinary: Negative for dysuria.  Skin: Negative for rash.    Past Medical History  Diagnosis Date  . Thyroid disease   . Hypertension   . Allergy   . Arthritis   . Sleep apnea   . Kidney stone   . Glioblastoma multiforme of occipital lobe ([place])   . Nocturnal leg cramps 09/29/2014  . History of radiation therapy 08/03/14- 09/15/14    right occipito-parietal brain/46 Gy 23 fx, right brain boost/ 14 Gy in 7 fx   Past Surgical History  Procedure Laterality Date  . Knee arthroscopy w/ meniscal repair    . Treatment fistula anal    . Craniotomy N/A 07/07/2014    Procedure: CRANIOTOMY TUMOR EXCISION/CURVE;  Surgeon: Elaina Hoops, MD;  Location: [facility];  Service: Neurosurgery;  Laterality: N/A;  . Brain surgery     Social History:   reports that he has been smoking Cigarettes.  He has a 22.5 pack-year smoking history. He has never used smokeless tobacco. He reports that he drinks alcohol. He reports that he does not use illicit drugs.  Family History  Problem Relation Age of Onset  . Hypertension Mother   . Diabetes Father   . Glaucoma Father   . Diabetes Brother   . Diabetes Brother   . Colon cancer Neg Hx     Medications:   Medication List       This list is accurate as of: 10/11/15 12:46 PM.  Always use your most recent med list.               acetaminophen 325 MG tablet  Commonly known as:  TYLENOL  Take 2 tablets (650 mg total) by mouth every 6 (six) hours as needed for mild pain (or Fever >/= 101).     amitriptyline 25 MG tablet  Commonly known as:  ELAVIL  Take 25 mg by mouth at bedtime.     butalbital-acetaminophen-caffeine 50-325-40-30 MG capsule  Commonly known as:  FIORICET WITH CODEINE  Take one capsule by mouth four times daily for headache. Do not exceed 4gm of Tylenol in 24 hours     calcium  carbonate 750 MG chewable tablet  Commonly known as:  TUMS EX  Chew 2 tablets by mouth 2 (two) times daily. Reported on 09/27/2015     chlorhexidine 0.12 % solution  Commonly known as:  PERIDEX  Rinse with 15 mls three daily for 30 seconds. Use after breakfast, lunch and at bedtime. Spit out excess. Do not swallow.     cyclobenzaprine 10 MG tablet  Commonly known as:  FLEXERIL  Take 1 tablet (10 mg total) by mouth 3 (three) times daily as needed for muscle spasms.     dexamethasone 4 MG tablet  Commonly known as:  DECADRON  Take 2 tablets (8 mg total) by mouth 4 (four) times daily.     docusate sodium 100 MG capsule  Commonly known as:  COLACE  Take 100 mg by mouth daily.     HYDROcodone-acetaminophen 5-325 MG tablet  Commonly known as:  NORCO/VICODIN  Take 1 tablet by mouth every 4 (four) hours as needed for moderate pain. Do not exceed 4gm of Tylenol in 24 hours     LACTOBACILLUS PO  Take 1 capsule by mouth daily.     levothyroxine 25 MCG tablet  Commonly known as:  SYNTHROID, LEVOTHROID  Take 25 mcg by mouth daily before breakfast.     lisinopril 40 MG tablet  Commonly known as:  PRINIVIL,ZESTRIL  Take 40 mg by mouth daily.     LORazepam 0.5 MG tablet  Commonly known as:  ATIVAN  take 1  tablet by mouth every 8 hours if needed for ANXIETY, NAUSEA, OR SLEEP     multivitamin with minerals tablet  Take 1 tablet by mouth daily.     NEXIUM 24HR 20 MG capsule  Generic drug:  esomeprazole  Take 20 mg by mouth daily.     nystatin 100000 UNIT/ML suspension  Commonly known as:  MYCOSTATIN  Take 5 mLs (500,000 Units total) by mouth 4 (four) times daily.     PROCEL PO  Take by mouth 2 (two) times daily. 2 scoops bid     UNABLE TO FIND  Med Name: Med pass. 90 mL TID for nutritional support and protein     verapamil 180 MG (CO) 24 hr tablet  Commonly known as:  COVERA HS  Take 1 tablet (180 mg total) by mouth at bedtime.        Immunizations: Immunization History    Administered Date(s) Administered  . Influenza-Unspecified 07/25/2014     Physical Exam: Filed Vitals:   10/11/15 1228  BP: 121/59  Pulse: 80  Temp: 97 F (36.1 C)  TempSrc: Oral  Resp: 16  Height: 5\' 1"  (1.549 m)  Weight: 247 lb (112.038 kg)  SpO2: 100%   Body mass index is 46.69 kg/(m^2).    General- adult male, obese, in no acute distress  Head- normocephalic, atraumatic Nose- no maxillary or frontal sinus tenderness, no nasal discharge Throat- moist mucus membrane Eyes- no pallor, no icterus, no discharge, normal conjunctiva, normal sclera Neck- no cervical lymphadenopathy Cardiovascular- normal s1,s2, no murmurs Respiratory- bilateral poor air entry, no wheeze, no rhonchi, no crackles, no use of accessory muscles Abdomen- bowel sounds present, soft, non tender Musculoskeletal- able to move all 4 extremities, generalized weakness, deconditioned Neurological- alert and oriented but falling asleep in between conversation Skin- warm and dry, easy bruising, skin tear +   Labs reviewed: Basic Metabolic Panel:  Recent Labs  10/06/15 1936 10/08/15 0546 10/10/15 0523  NA 141 137 140  K 3.5 4.1 4.0  CL 110 109 110  CO2 22 21* 21*  GLUCOSE 162* 131* 121*  BUN 24* 19 18  CREATININE 0.49* 0.50* 0.57*  CALCIUM 8.5* 8.2* 8.8*  MG 1.9  --   --    Liver Function Tests:  Recent Labs  10/06/15 1413 10/06/15 1936 10/08/15 0546  AST 77* 87* 69*  ALT 104* 107* 90*  ALKPHOS 151* 137* 109  BILITOT 0.74 0.7 0.7  PROT 6.1* 5.8* 5.3*  ALBUMIN 2.9* 2.8* 2.6*   No results for input(s): LIPASE, AMYLASE in the last 8760 hours. No results for input(s): AMMONIA in the last 8760 hours. CBC:  Recent Labs  09/27/15 1004 10/06/15 1936 10/08/15 0546  WBC 8.3 7.2 9.4  NEUTROABS 7.3* 6.6 8.5*  HGB 12.8* 12.6* 11.2*  HCT 38.7 39.2 33.3*  MCV 89.0 91.2 89.3  PLT 148 125* 119*   Cardiac Enzymes:  Recent Labs  10/06/15 0058 10/07/15 0524 10/07/15 1100  TROPONINI  0.04* 0.04* 0.03   BNP: Invalid input(s): POCBNP CBG:  Recent Labs  07/04/15 0733 10/08/15 0733  GLUCAP 112* 95    Radiological Exams: Ct Head Wo Contrast  10/08/2015  CLINICAL DATA:  Brain tumor.  Left-sided weakness EXAM: CT HEAD WITHOUT CONTRAST TECHNIQUE: Contiguous axial images were obtained from the base of the skull through the vertex without intravenous contrast. COMPARISON:  CT 09/19/2015 FINDINGS: Infiltrating low-density mass is seen throughout the right posterior temporal, parietal, and occipital lobes. Increased low-density edema/ tumor in the right temporal lobe and right basal ganglia. There is extensive low-density enlarging the splenium of the corpus callosum which is unchanged. Dystrophic calcifications are present within the mass as noted previously. Mass-effect on the right lateral ventricle which is compressed. 13 mm midline shift to the left shows mild progression in the interval. The ventricles are not enlarged. No acute hemorrhage. No acute infarct. Opacification of the right sphenoid sinus with mucosal thickening in the paranasal sinuses. Craniotomy changes in the right parietal region noted. IMPRESSION: Diffuse low-density mass in the right posterior cerebral hemisphere with dystrophic calcification. Increased low-density edema/tumor in the right temporal lobe and right basal ganglia. Increased mass-effect and midline shift now measuring 13 mm. No associated acute hemorrhage. Electronically Signed   By: Franchot Gallo M.D.   On: 10/08/2015 10:27   Ct Head Wo Contrast  09/19/2015  CLINICAL DATA:  Glioblastoma.  Rule out acute hemorrhage. EXAM: CT HEAD WITHOUT CONTRAST TECHNIQUE: Contiguous axial images were obtained from the base of the skull through the vertex without intravenous contrast. COMPARISON:  MRI 07/03/2015.  CT 07/02/2015. FINDINGS: Large mass again noted in the posterior right cerebral hemisphere involving the right occipital lobe, parietal lobe, posterior  temporal and frontal lobes. Areas of high density within the mass are unchanged and likely reflect calcifications. No evidence of hemorrhage. The overall mass  has extended to now involve and cross the posterior corpus callosum. Increasing right to left midline shift, now all 9 mm. Mass-effect on the right lateral ventricle. No hydrocephalus. IMPRESSION: Enlarging right posterior cerebral hemisphere mass, now crossing the posterior corpus callosum. No evidence for hemorrhage. Increasing right to left midline shift, now 9 mm. Electronically Signed   By: Rolm Baptise M.D.   On: 09/19/2015 18:56   Mr [name] [id number] Contrast  09/21/2015  CLINICAL DATA:  60 year old male with recurrent glioblastoma enrolled in clinical vaccine trial at [facility] last month. Recently developed severe headache requiring increase pain medication. Subsequent encounter. EXAM: MRI HEAD WITHOUT AND WITH CONTRAST TECHNIQUE: Multiplanar, multiecho pulse sequences of the brain and surrounding structures were obtained without and with intravenous contrast. CONTRAST:  24mL MULTIHANCE GADOBENATE DIMEGLUMINE 529 MG/ML IV SOLN COMPARISON:  Head CT without contrast 09/19/2015. Restaging Brain MRI 07/03/2015. FINDINGS: Significant interval worsening of abnormal masslike T2 hyperintensity in the brain, now extensively affecting the splenium of the corpus callosum and tracking across midline (series 6, image 18), extending into the right deep gray matter nuclei (image 17) and farther anteriorly into the right temporal lobe (image 15). Associated diffusion signal abnormality also has significantly increased, with a confluent rim of restricted diffusion adjacent to the right atrium (series 400, image 31). Also, there is abnormal restricted diffusion in the splenium which is new (series 4, image 30). Subsequent increased leftward midline shift of 8-9 mm (4 mm previously). There might be early trapping of the left lateral ventricle, where the temporal and occipital  horns are mildly increased (series 7, image 14). Following contrast there is less confluent enhancement in the right occipital pole (series 11, image 24), but increased stellate enhancement about the atrium of the right lateral ventricle (image 29). The lesion only crosses midline at the splenium, and the advancing edge of abnormal T2 hyperintensity and T1 hypo intensity does not enhance. Chronic blood products in the right occipital lobe are stable. Overlying post craniotomy changes. Basilar cisterns remain patent. Brainstem, cerebellum, cervicomedullary junction, and visualized cervical spine remain normal. Major intracranial vascular flow voids are stable. No diffusion abnormality to suggest acute infarction. No acute intracranial hemorrhage identified. Bone marrow signal remains normal. Mastoids and paranasal sinuses remain clear. Stable orbit and scalp soft tissues. IMPRESSION: Significant worsening of mass like T2 hyperintensity in the right hemisphere, with new extensive involvement of the splenium of the corpus callosum which tracks across midline to the left. Subsequent increased intracranial mass effect with leftward midline shift now 8-9 mm (4 mm in October). Suspected early trapping of the left lateral ventricle. True progression of disease was strongly favored in October considering the patient had not yet begun new therapy at that time. However, if the patient has undergone interval vaccination/immune therapy at [facility] then there is a chance that these changes since October are treatment effect. No new intracranial abnormality. Electronically Signed   By: Genevie Ann M.D.   On: 09/21/2015 13:45   Dg Chest Port 1 View  10/06/2015  CLINICAL DATA:  Sepsis. Glioma within the occipital lobe. Decreased blood pressure. EXAM: PORTABLE CHEST 1 VIEW COMPARISON:  07/03/2015 FINDINGS: Heart size is normal. No pleural effusion identified. Bilateral interstitial opacities are identified which appear new from previous  exam. No lobar consolidation. IMPRESSION: 1. Bilateral interstitial opacities which may reflect atypical infection or edema. Electronically Signed   By: Kerby Moors M.D.   On: 10/06/2015 19:03    Assessment/Plan  Physical deconditioning Will have him  work with physical therapy and occupational therapy team to help with gait training and muscle strengthening exercises.fall precautions. Skin care. Encourage to be out of bed. Has been hospitalized twice in last 2 weeks. Poor overall prognosis with his glioblastoma progressing. Currently wants to remain full code.   HCAP Breathing appears stable at present, continue levaquin until 10/13/15. Aspiration precautions given increased somnolence. Get SLP to evaluate given his aspiration risk  Glioblastoma multiforme Recurrent glioma with progression of disease and midline shift noted on brain imaging. Currently on clinical trial with intratumoral infusion of D2C7 and avastin. Continue decadron 8 mg q6h for now until seen by oncology on Friday. Continue fioricet and prn norco for pain. Continue amitriptyline. Check cbc and bmp 10/16/15  Protein calorie malnutrition To be followed by dietary team, continue protein supplement  Headache Continue amitriptyline daily and prn fioricet for now  Oral thrush Continue oral hygiene and continue nystatin mouth wash  HTN Stable. Continue lisinopril 40 mg daily, verapamil 180 mg daily, monitor BP reading. monitor cmp  Constipation Continue docusate 100 mg daily and monitor  Goals of care: short term rehabilitation   Labs/tests ordered: cbc, cmp  Family/ staff Communication: reviewed care plan with patient and nursing supervisor    Blanchie Serve, MD Internal Medicine [place], [place] [address] Cell Phone (Monday-Friday 8 am - 5 pm): [phone] On Call: [phone] and follow prompts after 5 pm and on weekends Office Phone:  [phone] Office Fax: [phone]

## 2015-10-12 ENCOUNTER — Encounter: Payer: Self-pay | Admitting: General Practice

## 2015-10-12 ENCOUNTER — Telehealth: Payer: Self-pay | Admitting: Hematology

## 2015-10-12 ENCOUNTER — Encounter: Payer: Self-pay | Admitting: *Deleted

## 2015-10-12 ENCOUNTER — Telehealth: Payer: Self-pay | Admitting: *Deleted

## 2015-10-12 NOTE — Telephone Encounter (Signed)
Faxed last fusion 09/27/15 to [facility] [phone] release id)

## 2015-10-12 NOTE — Progress Notes (Signed)
Spiritual Care Note  Continuing to follow pt/family for support.  Left supportive VM for spouse [name].  Also plan to piggyback on pt's appt with Dr Burr Medico tomorrow in order to see Louie Casa and [name].  Please page as needs arise/circumstances change.  Thank you.  [place], [place], [facility] Pager [phone] Voicemail  [phone]

## 2015-10-12 NOTE — Telephone Encounter (Signed)
[facility] calling to confirm if patient received avastin here on 09-27-15.  Called them back and let them know that yes, he did.  They requested Infusion room note as well as labs and VS.  Faxed all information to them at [phone].

## 2015-10-13 ENCOUNTER — Other Ambulatory Visit: Payer: Self-pay | Admitting: *Deleted

## 2015-10-13 ENCOUNTER — Telehealth: Payer: Self-pay | Admitting: Hematology

## 2015-10-13 ENCOUNTER — Telehealth: Payer: Self-pay | Admitting: *Deleted

## 2015-10-13 ENCOUNTER — Ambulatory Visit (HOSPITAL_BASED_OUTPATIENT_CLINIC_OR_DEPARTMENT_OTHER): Payer: BLUE CROSS/BLUE SHIELD | Admitting: Hematology

## 2015-10-13 ENCOUNTER — Encounter: Payer: Self-pay | Admitting: Hematology

## 2015-10-13 ENCOUNTER — Encounter: Payer: Self-pay | Admitting: *Deleted

## 2015-10-13 ENCOUNTER — Other Ambulatory Visit (HOSPITAL_BASED_OUTPATIENT_CLINIC_OR_DEPARTMENT_OTHER): Payer: BLUE CROSS/BLUE SHIELD

## 2015-10-13 VITALS — BP 112/82 | HR 79 | Temp 97.9°F | Resp 18 | Ht 61.0 in

## 2015-10-13 DIAGNOSIS — C713 Malignant neoplasm of parietal lobe: Secondary | ICD-10-CM

## 2015-10-13 DIAGNOSIS — R531 Weakness: Secondary | ICD-10-CM | POA: Diagnosis not present

## 2015-10-13 DIAGNOSIS — E039 Hypothyroidism, unspecified: Secondary | ICD-10-CM

## 2015-10-13 DIAGNOSIS — C714 Malignant neoplasm of occipital lobe: Secondary | ICD-10-CM

## 2015-10-13 DIAGNOSIS — G8929 Other chronic pain: Secondary | ICD-10-CM

## 2015-10-13 DIAGNOSIS — C719 Malignant neoplasm of brain, unspecified: Secondary | ICD-10-CM

## 2015-10-13 DIAGNOSIS — R51 Headache: Secondary | ICD-10-CM

## 2015-10-13 DIAGNOSIS — I10 Essential (primary) hypertension: Secondary | ICD-10-CM

## 2015-10-13 DIAGNOSIS — R2990 Unspecified symptoms and signs involving the nervous system: Secondary | ICD-10-CM

## 2015-10-13 LAB — COMPREHENSIVE METABOLIC PANEL
ALBUMIN: 2.4 g/dL — AB (ref 3.5–5.0)
ALK PHOS: 129 U/L (ref 40–150)
ALT: 98 U/L — ABNORMAL HIGH (ref 0–55)
AST: 55 U/L — ABNORMAL HIGH (ref 5–34)
Anion Gap: 11 mEq/L (ref 3–11)
BUN: 18.3 mg/dL (ref 7.0–26.0)
CO2: 21 mEq/L — ABNORMAL LOW (ref 22–29)
Calcium: 9.3 mg/dL (ref 8.4–10.4)
Chloride: 105 mEq/L (ref 98–109)
Creatinine: 0.6 mg/dL — ABNORMAL LOW (ref 0.7–1.3)
GLUCOSE: 115 mg/dL (ref 70–140)
Potassium: 4 mEq/L (ref 3.5–5.1)
SODIUM: 137 meq/L (ref 136–145)
Total Bilirubin: 0.42 mg/dL (ref 0.20–1.20)
Total Protein: 5.9 g/dL — ABNORMAL LOW (ref 6.4–8.3)

## 2015-10-13 LAB — CBC WITH DIFFERENTIAL/PLATELET
BASO%: 0.2 % (ref 0.0–2.0)
BASOS ABS: 0 10*3/uL (ref 0.0–0.1)
EOS%: 0 % (ref 0.0–7.0)
Eosinophils Absolute: 0 10*3/uL (ref 0.0–0.5)
HCT: 38.4 % (ref 38.4–49.9)
HEMOGLOBIN: 12.8 g/dL — AB (ref 13.0–17.1)
LYMPH#: 0.6 10*3/uL — AB (ref 0.9–3.3)
LYMPH%: 7 % — ABNORMAL LOW (ref 14.0–49.0)
MCH: 29.6 pg (ref 27.2–33.4)
MCHC: 33.3 g/dL (ref 32.0–36.0)
MCV: 88.9 fL (ref 79.3–98.0)
MONO#: 0.3 10*3/uL (ref 0.1–0.9)
MONO%: 3.4 % (ref 0.0–14.0)
NEUT#: 7.3 10*3/uL — ABNORMAL HIGH (ref 1.5–6.5)
NEUT%: 89.4 % — ABNORMAL HIGH (ref 39.0–75.0)
Platelets: 153 10*3/uL (ref 140–400)
RBC: 4.32 10*6/uL (ref 4.20–5.82)
RDW: 20.5 % — AB (ref 11.0–14.6)
WBC: 8.2 10*3/uL (ref 4.0–10.3)
nRBC: 1 % — ABNORMAL HIGH (ref 0–0)

## 2015-10-13 LAB — TECHNOLOGIST REVIEW

## 2015-10-13 MED ORDER — OXYCODONE-ACETAMINOPHEN 5-325 MG PO TABS
ORAL_TABLET | ORAL | Status: AC
  Filled 2015-10-13: qty 2

## 2015-10-13 MED ORDER — OXYCODONE-ACETAMINOPHEN 5-325 MG PO TABS
2.0000 | ORAL_TABLET | Freq: Once | ORAL | Status: AC
  Administered 2015-10-13: 2 via ORAL

## 2015-10-13 NOTE — Telephone Encounter (Signed)
Per 2/10 pof MD requested that patient have a private room in treatment room 2/15. Per notes chemo is aware and arrangements will be made on day of visit.

## 2015-10-13 NOTE — Telephone Encounter (Signed)
Called & spoke with Jake [name] & faxed orders to [phone] to stop lisinopril & continue same dose of dexamethasone 8 mg every 6 hours per Dr Truitt Merle & pt has an appt here at the cancer center for 10/18/15 @ 1:15 pm for lab & treatment.

## 2015-10-13 NOTE — Progress Notes (Signed)
[place] FOLLOW UP NOTE  Patient Care Team: Truitt Merle, MD as PCP - General (Hematology) Kary Kos, MD as Consulting Physician (Neurosurgery) Eppie Gibson, MD as Attending Physician (Radiation Oncology) Truitt Merle, MD as Consulting Physician (Hematology) Meta Hatchet, LCSW as Social Worker     Glioblastoma of occipital lobe [facility]) (Resolved)   07/02/2014 Imaging Brain MRI: Two adjacent enhancing mass lesions in the right parietal lobe with central necrosis and extensive surrounding edema and mass effect.  7 mm midline shift to the left.  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   CT CAP(-)   07/07/2014 Surgery Subtotal right parietal tumors resection    08/03/2014 Concurrent Chemotherapy Concurrent radiation with Temodar 75 mg/m daily, completed on 09/15/2014.    09/29/2014 - 12/28/2014 Chemotherapy Adjuvant Temodar, 150-200mg /m2 for 5 days, every 28 day cycle, s/p 3 cycles, stopped due to disease progression.   10/14/2014 Imaging Two discrete foci of enhancement in the RIGHT occipital lobe surround the larger resection cavity, and are suspected to represent foci of residual tumor/GBM displaying interval growt   12/13/2014 Imaging Increased size/ enhancement of 2 lesions adjacent to the right occipital resection cavity, concerning for growth of residual tumor.   01/06/2015 - 05/26/2015 Chemotherapy Bevacizumab 10mg /kg every 2 weeks, stopped due to disease progression    04/18/2015 Progression Brain MRI showed progressed masslike signal abnormality in the right occipital lobe, most compatible with interval to progression of disease.   04/28/2015 - 05/26/2015 Chemotherapy Irinotecan 1 25 mg/m, every 2 weeks, was added on to Avastin due to disease progression. Stopped due to disease progression.    06/02/2015 Progression Brain MRI showed further increased masslike T2 hyperintensity and irregular enhancement in the right occipital and posterior right temporal lobes since August  again suggesting true progression of disease. Intracranial mass effect is stable.   07/03/2015 - 07/04/2015 Hospital Admission He was admitted for worsening headaches and fever. ID workup was negative. His dexamethasone was increased, and his headaches are much improved. He was discharged home Today.   08/22/2015 Procedure Admission to [name] for AIRO guided right tempora sterostatic brain biopsy with placement of two therapeutic catheters. The frozen pathology showed recurrent malignant glioma. Infusion of D2C7 vaccine was started on 08/22/15. Catheters removed 12/26   09/19/2015 - 09/22/2015 Hospital Admission Intractable headache with worsening brain mass with midline shift. Discharged with steroids to rehab facility.    09/20/2015 Imaging Significant worsening of mass like T2 hyperintensity in the right hemisphere, with new extensive involvement of the splenium of the corpus callosum which tracks across midline to the left. Subsequent increased intracranial mass effect with leftward mid   09/27/2015 -  Chemotherapy Avastin 7.5mg /m2 every 3 weeks    10/06/2015 - 10/10/2015 Hospital Admission Pt was admitted for acute encephalopathy and healthcare associated pneumonia. Repeat CT head showed worsening edema around tumor, and worsening midline shift. He was treated with antibiotics and discharged back to SNF    GBM (glioblastoma multiforme) ([facility])   07/22/2014 Initial Diagnosis GBM (glioblastoma multiforme)    CURRENT THERAPY: 1. [name] phase 1 clinical trail D2C7 vaccine started on 08/22/2015 2. Bevacizumab 7.5mg /kg every 3 weeks, started on 09/27/2015 3. Dexa increased to 6mg  q6h on 09/19/15, decreased to 4mg  q8h on 09/27/15, increased to 10mg  q6h on 2/6, then 8mg  q6h on 2/8    CHIEF COMPLAINTS: follow up GBM   HISTORY OF INITIAL PRESENTATION;   He initially presented was headaches and sinus congestion in mid October 2015. He  was seen by his primary care physician and was treated for sinus infection. He  subsequently developed left sided visual deficit, left arm and leg weakness and unsteady gait. He was sent to emergency room by his primary care physician on 07/02/2014. CT  And MRI of brain reviewed to add adjacent enhancing mass lesions in the right parietal lobe with central necrosis and extensive surrounding edema and mass effect. He was admitted and underwent right craniotomy  For resection of the 2 brain masses by Dr. Lonell Grandchild. Postsurgical brain MRI revealed a 1.3 cm area of enhancement at the anterior margin of surgical resection cavity, likely surgical change. His case was reviewed in the tumor board and it was felt he had subtotal resection.  INTERVAL HISTROY: Jake [name] returns for follow-up. He was brought in by an ambulance from his nursing home, and he is also a comment by his wife to clinic today. He was admitted to [facility] from my clinic on February 3, for acute encephalopathy and sepsis. He was found to have hospital-acquired pneumonia, was treated with antibiotics. Repeat CT showed worsening intracranial edema and midline shift, dexamethasone dose was increased. He was discharged back to his negative 3 days ago. Per patient's wife, his headache overall has improved, although he does complain better headache sometime, he also complains of ankle pain today, no ankle injury or swelling. His back pain has improved. No fever or chills, or occasional dry cough. He still bedbound most of time. His appetite is good, he is well, he is on peered food.   MEDICAL HISTORY:  Past Medical History  Diagnosis Date  . Thyroid disease   . Hypertension   . Allergy   . Arthritis   .    Marland Kitchen Kidney stone     SURGICAL HISTORY: Past Surgical History  Procedure Laterality Date  . Knee arthroscopy w/ meniscal repair    . Treatment fistula anal    . 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 Marital Status: Married    Spouse Name: N/A    Number of Children: 5 children, age of 60-21  . Years of Education: N/A   Occupational History  . 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    Social History Main Topics  . Smoking status: Current Every Day Smoker -- 1.00 packs/day for 45 years    Types: Cigarettes  . Smokeless tobacco: Never Used  . Alcohol Use: Yes     Comment: 2-3 beers per/day   . Drug Use: No  . Sexual Activity: Not on file     FAMILY HISTORY: Family History  Problem Relation Age of Onset  . Hypertension Mother   . Diabetes Father   . Glaucoma Father   . Diabetes Brother   . Diabetes Brother   . Colon cancer Neg Hx   Paternal ancle had prostate cancer. No other family history of malignancy.  ALLERGIES:  is allergic to other.   MEDICATIONS:  Current Outpatient Prescriptions  Medication Sig Dispense Refill  . amitriptyline (ELAVIL) 25 MG tablet Take 25 mg by mouth at bedtime.     . butalbital-acetaminophen-caffeine (FIORICET WITH CODEINE) 50-325-40-30 MG capsule Take 1 capsule by mouth every 4 (four) hours as needed for headache.    . chlorhexidine (PERIDEX) 0.12 % solution  Rinse with 15 mls three daily for 30 seconds. Use after breakfast, lunch and at bedtime. Spit out excess. Do not swallow. 1440 mL prn  . cyclobenzaprine (FLEXERIL) 10 MG tablet Take 1 tablet (10 mg total) by mouth 3 (three) times daily as needed for muscle spasms. 30 tablet 0  . dexamethasone (DECADRON) 4 MG tablet Take 2 tablets (8 mg total) by mouth 4 (four) times daily. 42 tablet 0  . docusate sodium (COLACE) 100 MG capsule Take 100 mg by mouth daily.     Marland Kitchen HYDROcodone-acetaminophen (NORCO/VICODIN) 5-325 MG tablet Take 1 tablet by mouth every 4 (four) hours as needed for moderate pain. Do not exceed 4gm of Tylenol in 24 hours 180 tablet 0  . LACTOBACILLUS PO Take 1 capsule by mouth  daily.    Marland Kitchen levofloxacin (LEVAQUIN) 750 MG tablet Take 750 mg by mouth daily.    Marland Kitchen levothyroxine (SYNTHROID, LEVOTHROID) 25 MCG tablet Take 25 mcg by mouth daily before breakfast.    . LORazepam (ATIVAN) 0.5 MG tablet take 1  tablet by mouth every 8 hours if needed for ANXIETY, NAUSEA, OR SLEEP 90 tablet 0  . NEXIUM 24HR 20 MG capsule Take 20 mg by mouth daily.   0  . nystatin (MYCOSTATIN) 100000 UNIT/ML suspension Take 5 mLs (500,000 Units total) by mouth 4 (four) times daily. 473 mL 1  . polyethylene glycol (MIRALAX / GLYCOLAX) packet Take 17 g by mouth daily.    . polyvinyl alcohol (LIQUIFILM TEARS) 1.4 % ophthalmic solution Place 1 drop into both eyes 4 (four) times daily.    Marland Kitchen senna (SENOKOT) 8.6 MG tablet Take 1 tablet by mouth daily.    . verapamil (COVERA HS) 180 MG (CO) 24 hr tablet Take 1 tablet (180 mg total) by mouth at bedtime. 90 tablet 3  . acetaminophen (TYLENOL) 325 MG tablet Take 2 tablets (650 mg total) by mouth every 6 (six) hours as needed for mild pain (or Fever >/= 101). (Patient not taking: Reported on 10/13/2015) 30 tablet 0  . Multiple Vitamins-Minerals (MULTIVITAMIN WITH MINERALS) tablet Take 1 tablet by mouth daily. Reported on 10/13/2015 Pt takes on multivitamen    . Protein (PROCEL PO) Take by mouth 2 (two) times daily. Reported on 10/13/2015 Pt takes on protein shakes     No current facility-administered medications for this visit.    REVIEW OF SYSTEMS:   Constitutional: Denies fevers, chills or abnormal night sweats Eyes: Denies blurriness of vision, double vision or watery eyes Ears, nose, mouth, throat, and face: Denies mucositis or sore throat Respiratory: Denies cough, dyspnea or wheezes Cardiovascular: Denies palpitation, chest discomfort or lower extremity swelling Gastrointestinal:  Denies nausea, heartburn or change in bowel habits Skin: Denies abnormal skin rashes Lymphatics: Denies new lymphadenopathy or easy bruising Neurological:positive for mild  headaches, and left side vision deficits. His left-sided weakness has nearly resolved. Denies numbness, tingling or new weaknesses Behavioral/Psych: Mood is stable, no new changes  All other systems were reviewed with the patient and are negative.  PHYSICAL EXAMINATION: ECOG PERFORMANCE STATUS: 1 - Symptomatic but completely ambulatory  KPS: 80%  Filed Vitals:   10/13/15 1548  BP: 112/82  Pulse: 79  Temp: 97.9 F (36.6 C)  Resp: 18   Filed Weights    GENERAL:lying on the stretch, slightly confused, able to answer some simple questions and follow commands. SKIN: skin color, texture, turgor are normal, no rashes or significant lesions HEAD: right-sided posterior craniotomy surgical wound is healing well, no surrounding edema,  skin redness or discharge EYES: normal, conjunctiva are pink and non-injected, sclera clear OROPHARYNX:no exudate, no erythema and lips, buccal mucosa, and (+) black hairy tongue  NECK: supple, thyroid normal size, non-tender, without nodularity LYMPH:  no palpable lymphadenopathy in the cervical, axillary or inguinal LUNGS: clear to auscultation and percussion with normal breathing effort HEART: regular rate & rhythm and no murmurs and no lower extremity edema ABDOMEN:abdomen soft, non-tender and normal bowel sounds Musculoskeletal:no cyanosis of digits and no clubbing  PSYCH: alert & oriented x 3 with fluent speech NEURO: cranial nerve 2-12 are intact, left UE and LE strength is significantly low   LABORATORY DATA:  CBC Latest Ref Rng 10/13/2015 10/08/2015 10/06/2015  WBC 4.0 - 10.3 10e3/uL 8.2 9.4 7.2  Hemoglobin 13.0 - 17.1 g/dL 12.8(L) 11.2(L) 12.6(L)  Hematocrit 38.4 - 49.9 % 38.4 33.3(L) 39.2  Platelets 140 - 400 10e3/uL 153 119(L) 125(L)    CMP Latest Ref Rng 10/13/2015 10/10/2015 10/08/2015  Glucose 70 - 140 mg/dl 115 121(H) 131(H)  BUN 7.0 - 26.0 mg/dL 18.3 18 19   Creatinine 0.7 - 1.3 mg/dL 0.6(L) 0.57(L) 0.50(L)  Sodium 136 - 145 mEq/L 137 140 137   Potassium 3.5 - 5.1 mEq/L 4.0 4.0 4.1  Chloride 101 - 111 mmol/L - 110 109  CO2 22 - 29 mEq/L 21(L) 21(L) 21(L)  Calcium 8.4 - 10.4 mg/dL 9.3 8.8(L) 8.2(L)  Total Protein 6.4 - 8.3 g/dL 5.9(L) - 5.3(L)  Total Bilirubin 0.20 - 1.20 mg/dL 0.42 - 0.7  Alkaline Phos 40 - 150 U/L 129 - 109  AST 5 - 34 U/L 55(H) - 69(H)  ALT 0 - 55 U/L 98(H) - 90(H)     Surgical path 07/07/2014  Diagnosis 1. Brain, biopsy - GLIOBLASTOMA MULTIFORME, WHO GRADE IV/IV. - SEE ONCOLOGY TABLE BELOW. 2. Brain, for tumor resection, higher right parietal - GLIOBLASTOMA MULTIFORME, WHO GRADE IV/IV. - SEE ONCOLOGY TABLE BELOW. 3. Brain, for tumor resection, lower right occipital - GLIOBLASTOMA MULTIFORME, WHO GRADE IV/IV   Microscopic Comment 1. -3. ONCOLOGY TABLE - BRAIN AND SPINAL CORD 1. Procedure: Resection x2 2. Tumor site, including laterality: Right parietal and right occipital 3. Maximum tumor size (cm): At least 1.8 cm 4. Histologic type: Glioblastoma multiforme 5. Grade: WHO Grade IV/IV 6. Margins (if applicable): Can not be assessed 7. Ancillary studies: Can be performed upon clinician request.  RADIOGRAPHIC STUDIES: I have personally reviewed the radiological images as listed and agreed with the findings in the report.  Jake [name] Wo Contrast 09/21/2015 IMPRESSION: Significant worsening of mass like T2 hyperintensity in the right hemisphere, with new extensive involvement of the splenium of the corpus callosum which tracks across midline to the left. Subsequent increased intracranial mass effect with leftward midline shift now 8-9 mm (4 mm in October). Suspected early trapping of the left lateral ventricle.  True progression of disease was strongly favored in October considering the patient had not yet begun new therapy at that time. However, if the patient has undergone interval vaccination/immune therapy at [facility] then there is a chance that these changes since October are treatment  effect.  No new intracranial abnormality.  ASSESSMENT & PLAN:  Jake [name] is a 60 year old gentleman with newly diagnosed right parietal GBM, status post subtotal resection.   1. GBM, s/p subtotal resection, and concurrent chemoRT completed on 09/15/2014, progressed on adjuvant Temodar.  -He started the phase 1 vaccine clinical trial at at [facility] in December, his neurological function has deteriorated significantly -He has significant intracranial  mass effect with left midline shift, and surrounding edema -His overall outcome is rather poor. I previously discussed continuing Avastin, versus considering palliative care alone and hospice with pt and his wife, giving his quick deterioration lately. Patient's wife voiced good understanding about the overall poor prognosis, and wishes to continue Avastin therapy for now.  -I also spoke with Dr. Isaac Laud at [facility] and updated her about his condition, and she recommends continue Avastin for now, and watch for little longer before call hospice -He is scheduled to to return for cycle 2 of Avastin next Wednesday, February 15 -Giving his significant amount of intracranial edema and midline shift, I'll keep him on dexamethasone 8 mg every 6 hours for now, and if I reviewed him weekly. -We discussed fall precaution, aspiration precaution, which will sign of infection, and frequent turn to prevent decubitus ulcer etc.  2. Recent episode of healthcare associated pneumonia, ? Aspiration pneumonia -He has completed a course of antibiotics -He is afebrile, vital signs stable. I discussed aspiration precaution with patient's wife  3. Generalized weakness, neurological deterioration -worse latelu -nutrition supplement and supportive care -fall precaution  3. HTN and hypothyroidism  -We'll hold lisinopril for now, giving his episode of hypertension last week, and overall deconditioning  4. Code status and goal of care  -He is DO NOT RESUSCITATE and DO NOT  INTUBATE -pt and his wife agree with palliative care as goal of care  -Possible hospice if his neurological function continued to deteriorate in the next 2-3 weeks  Plan: -Continue dexamethasone at the current dose 8 mg every 6 hours -Hold lisinopril for now -Frequent turn every 2 hours, to prevent decubitus ulcer -Aspiration and fall precautions -He will return next Wednesday, February 15 for second dose Avastin, I'll see him in the infusion room.  -The above recommendations were communicated with his nursing home by phone and in written. I will also copy his MD Dr. Bubba Camp who sees him at the SNF   All questions were answered. The patient knows to call the clinic with any problems, questions or concerns.  I spent 30 minutes counseling the patient face to face. The total time spent in the appointmen was 40 minutes and more than 50% was on counseling.     Truitt Merle, MD 10/13/2015

## 2015-10-13 NOTE — Progress Notes (Signed)
[place] Work  Clinical Social Work met with pt and wife, [name] to offer support at follow up appointment. Pt continues at [facility] for PT, OT and speech. Wife appears well aware of pt's declining condition. Supportive listening provided to both pt and wife. CSW validated emotions and work of caregiving. CSW encouraged wife to reach out for additional support as needed.    Clinical Social Work interventions: Emotional support  Loren Racer, [place] Social Worker [place]  [place] Phone: [phone] Fax: [phone]

## 2015-10-16 LAB — BASIC METABOLIC PANEL
BUN: 20 mg/dL (ref 4–21)
Creatinine: 0.5 mg/dL — AB (ref 0.6–1.3)
GLUCOSE: 108 mg/dL
Potassium: 4.1 mmol/L (ref 3.4–5.3)
Sodium: 137 mmol/L (ref 137–147)

## 2015-10-16 LAB — CBC AND DIFFERENTIAL
HCT: 37 % — AB (ref 41–53)
Hemoglobin: 11.8 g/dL — AB (ref 13.5–17.5)
NEUTROS ABS: 6 /uL
PLATELETS: 154 10*3/uL (ref 150–399)
WBC: 7.9 10^3/mL

## 2015-10-16 LAB — HEPATIC FUNCTION PANEL
ALK PHOS: 128 U/L — AB (ref 25–125)
ALT: 98 U/L — AB (ref 10–40)
AST: 50 U/L — AB (ref 14–40)
Bilirubin, Total: 0.4 mg/dL

## 2015-10-17 ENCOUNTER — Other Ambulatory Visit: Payer: Self-pay | Admitting: *Deleted

## 2015-10-17 DIAGNOSIS — C719 Malignant neoplasm of brain, unspecified: Secondary | ICD-10-CM

## 2015-10-18 ENCOUNTER — Ambulatory Visit (HOSPITAL_BASED_OUTPATIENT_CLINIC_OR_DEPARTMENT_OTHER): Payer: BLUE CROSS/BLUE SHIELD | Admitting: Hematology

## 2015-10-18 ENCOUNTER — Ambulatory Visit (HOSPITAL_BASED_OUTPATIENT_CLINIC_OR_DEPARTMENT_OTHER): Payer: BLUE CROSS/BLUE SHIELD

## 2015-10-18 ENCOUNTER — Ambulatory Visit: Payer: BLUE CROSS/BLUE SHIELD | Admitting: Nutrition

## 2015-10-18 ENCOUNTER — Encounter: Payer: Self-pay | Admitting: General Practice

## 2015-10-18 ENCOUNTER — Telehealth: Payer: Self-pay | Admitting: Hematology

## 2015-10-18 ENCOUNTER — Encounter: Payer: Self-pay | Admitting: Hematology

## 2015-10-18 ENCOUNTER — Other Ambulatory Visit (HOSPITAL_BASED_OUTPATIENT_CLINIC_OR_DEPARTMENT_OTHER): Payer: 59

## 2015-10-18 ENCOUNTER — Other Ambulatory Visit: Payer: Self-pay | Admitting: Hematology

## 2015-10-18 ENCOUNTER — Encounter: Payer: Self-pay | Admitting: Adult Health

## 2015-10-18 ENCOUNTER — Non-Acute Institutional Stay (SKILLED_NURSING_FACILITY): Payer: 59 | Admitting: Adult Health

## 2015-10-18 VITALS — BP 118/83 | HR 106 | Temp 97.7°F | Wt 208.0 lb

## 2015-10-18 DIAGNOSIS — E46 Unspecified protein-calorie malnutrition: Secondary | ICD-10-CM

## 2015-10-18 DIAGNOSIS — R51 Headache: Secondary | ICD-10-CM | POA: Diagnosis not present

## 2015-10-18 DIAGNOSIS — I10 Essential (primary) hypertension: Secondary | ICD-10-CM | POA: Diagnosis not present

## 2015-10-18 DIAGNOSIS — C713 Malignant neoplasm of parietal lobe: Secondary | ICD-10-CM

## 2015-10-18 DIAGNOSIS — R531 Weakness: Secondary | ICD-10-CM

## 2015-10-18 DIAGNOSIS — F411 Generalized anxiety disorder: Secondary | ICD-10-CM | POA: Diagnosis not present

## 2015-10-18 DIAGNOSIS — C714 Malignant neoplasm of occipital lobe: Secondary | ICD-10-CM

## 2015-10-18 DIAGNOSIS — C719 Malignant neoplasm of brain, unspecified: Secondary | ICD-10-CM

## 2015-10-18 DIAGNOSIS — E038 Other specified hypothyroidism: Secondary | ICD-10-CM

## 2015-10-18 DIAGNOSIS — M6281 Muscle weakness (generalized): Secondary | ICD-10-CM

## 2015-10-18 DIAGNOSIS — R5381 Other malaise: Secondary | ICD-10-CM | POA: Insufficient documentation

## 2015-10-18 DIAGNOSIS — Z5112 Encounter for antineoplastic immunotherapy: Secondary | ICD-10-CM | POA: Diagnosis not present

## 2015-10-18 DIAGNOSIS — K219 Gastro-esophageal reflux disease without esophagitis: Secondary | ICD-10-CM | POA: Diagnosis not present

## 2015-10-18 DIAGNOSIS — R2990 Unspecified symptoms and signs involving the nervous system: Secondary | ICD-10-CM | POA: Diagnosis not present

## 2015-10-18 DIAGNOSIS — E039 Hypothyroidism, unspecified: Secondary | ICD-10-CM

## 2015-10-18 DIAGNOSIS — K5900 Constipation, unspecified: Secondary | ICD-10-CM | POA: Diagnosis not present

## 2015-10-18 DIAGNOSIS — M62838 Other muscle spasm: Secondary | ICD-10-CM

## 2015-10-18 DIAGNOSIS — R519 Headache, unspecified: Secondary | ICD-10-CM

## 2015-10-18 DIAGNOSIS — R740 Nonspecific elevation of levels of transaminase and lactic acid dehydrogenase [LDH]: Secondary | ICD-10-CM

## 2015-10-18 LAB — CBC WITH DIFFERENTIAL/PLATELET
BASO%: 0.8 % (ref 0.0–2.0)
BASOS ABS: 0.1 10*3/uL (ref 0.0–0.1)
EOS%: 0 % (ref 0.0–7.0)
Eosinophils Absolute: 0 10*3/uL (ref 0.0–0.5)
HCT: 41.8 % (ref 38.4–49.9)
HGB: 13.6 g/dL (ref 13.0–17.1)
LYMPH%: 6.8 % — ABNORMAL LOW (ref 14.0–49.0)
MCH: 28.3 pg (ref 27.2–33.4)
MCHC: 32.5 g/dL (ref 32.0–36.0)
MCV: 87.2 fL (ref 79.3–98.0)
MONO#: 0.4 10*3/uL (ref 0.1–0.9)
MONO%: 3.7 % (ref 0.0–14.0)
NEUT#: 10 10*3/uL — ABNORMAL HIGH (ref 1.5–6.5)
NEUT%: 88.7 % — AB (ref 39.0–75.0)
Platelets: 176 10*3/uL (ref 140–400)
RBC: 4.79 10*6/uL (ref 4.20–5.82)
RDW: 21.3 % — ABNORMAL HIGH (ref 11.0–14.6)
WBC: 11.3 10*3/uL — ABNORMAL HIGH (ref 4.0–10.3)
lymph#: 0.8 10*3/uL — ABNORMAL LOW (ref 0.9–3.3)

## 2015-10-18 LAB — COMPREHENSIVE METABOLIC PANEL
ALT: 161 U/L — ABNORMAL HIGH (ref 0–55)
AST: 76 U/L — AB (ref 5–34)
Albumin: 2.7 g/dL — ABNORMAL LOW (ref 3.5–5.0)
Alkaline Phosphatase: 144 U/L (ref 40–150)
Anion Gap: 11 mEq/L (ref 3–11)
BUN: 28.7 mg/dL — AB (ref 7.0–26.0)
CHLORIDE: 106 meq/L (ref 98–109)
CO2: 20 meq/L — AB (ref 22–29)
Calcium: 9.5 mg/dL (ref 8.4–10.4)
Creatinine: 0.6 mg/dL — ABNORMAL LOW (ref 0.7–1.3)
EGFR: >90 mL/min/{1.73_m2} (ref >90)
GLUCOSE: 108 mg/dL (ref 70–140)
POTASSIUM: 3.9 meq/L (ref 3.5–5.1)
SODIUM: 137 meq/L (ref 136–145)
Total Bilirubin: 0.62 mg/dL (ref 0.20–1.20)
Total Protein: 5.9 g/dL — ABNORMAL LOW (ref 6.4–8.3)

## 2015-10-18 LAB — TECHNOLOGIST REVIEW

## 2015-10-18 MED ORDER — SODIUM CHLORIDE 0.9 % IV SOLN
Freq: Once | INTRAVENOUS | Status: AC
  Administered 2015-10-18: 15:00:00 via INTRAVENOUS

## 2015-10-18 MED ORDER — SODIUM CHLORIDE 0.9 % IV SOLN
7.5000 mg/kg | Freq: Once | INTRAVENOUS | Status: AC
  Administered 2015-10-18: 700 mg via INTRAVENOUS
  Filled 2015-10-18: qty 24

## 2015-10-18 NOTE — Progress Notes (Signed)
Patient ID: Jake [name], male   DOB: 12/29/1955, 60 y.o.   MRN: [id number]    DATE:  10/18/15  MRN:  [id number]  BIRTHDAY: [birth year]  Facility:  Nursing Home Location:  [place] Room Number: [id number]  LEVEL OF CARE:  SNF [phone])  Contact Information    Name [place] Work [place] Spouse [phone]  [phone]   Dewayne Shorter [phone]  [phone]   [place] Other [phone]  [phone]       Code Status History    Date Active Date Inactive Code Status Order ID Comments User Context   10/09/2015  1:38 PM 10/10/2015  7:55 PM Full Code [id number]  Lavina Hamman, MD Inpatient   10/08/2015 11:22 AM 10/09/2015  1:38 PM DNR [id number]  Lavina Hamman, MD Inpatient   10/06/2015  6:32 PM 10/08/2015 11:22 AM Full Code [id number]  Reubin Milan, MD Inpatient   09/20/2015  3:04 AM 09/22/2015  8:19 PM Full Code [id number]  Rise Patience, MD ED   07/03/2015  4:28 PM 07/04/2015  3:56 PM Full Code [id number]  Robbie Lis, MD ED   07/07/2014 12:49 PM 07/09/2014  5:22 PM Full Code [id number]  Elaina Hoops, MD Inpatient   07/02/2014  8:54 PM 07/07/2014 12:49 PM Full Code [id number]  Cordelia Poche, MD Inpatient       Chief Complaint  Patient presents with  . Medical Management of Chronic Issues    HISTORY OF PRESENT ILLNESS:  This is a 60 year old male who is being seen for a routine visit. He was diagnosed with Glioblastoma multiforme. Imaging showed significant intramural mass effect with left midline shift and surrounding edema. He has started the phase 1 vaccine trial @ [name] in December 2016. He is currently having Avastin therapy @ the [facility]. He recently finished Levaquin therapy for HCAP. No SOB has been noted. His Synthroid was recently decreased to 25 mcg daily due to tsh 0.191.   PAST MEDICAL HISTORY:  Past Medical History  Diagnosis Date  . Thyroid disease   . Hypertension   . Allergy   . Arthritis    . Sleep apnea   . Kidney stone   . Glioblastoma multiforme of occipital lobe ([place])   . Nocturnal leg cramps 09/29/2014  . History of radiation therapy 08/03/14- 09/15/14    right occipito-parietal brain/46 Gy 23 fx, right brain boost/ 14 Gy in 7 fx     CURRENT MEDICATIONS: Reviewed  Patient's Medications  New Prescriptions   No medications on file  Previous Medications   ACETAMINOPHEN (TYLENOL) 325 MG TABLET    Take 2 tablets (650 mg total) by mouth every 6 (six) hours as needed for mild pain (or Fever >/= 101).   AMITRIPTYLINE (ELAVIL) 25 MG TABLET    Take 25 mg by mouth at bedtime.    BUTALBITAL-ACETAMINOPHEN-CAFFEINE (FIORICET WITH CODEINE) 50-325-40-30 MG CAPSULE    Take 1 capsule by mouth every 4 (four) hours as needed for headache.   CALCIUM CARBONATE (TUMS EX) 750 MG CHEWABLE TABLET    Chew 2 tablets by mouth 2 (two) times daily.   CHLORHEXIDINE (PERIDEX) 0.12 % SOLUTION    Rinse with 15 mls three daily for 30 seconds. Use after breakfast, lunch and at bedtime. Spit out excess. Do not swallow.   CYCLOBENZAPRINE (FLEXERIL) 10 MG TABLET    Take 1 tablet (10 mg total) by mouth 3 (three) times daily as needed for muscle  spasms.   DEXAMETHASONE (DECADRON) 4 MG TABLET    Take 2 tablets (8 mg total) by mouth 4 (four) times daily.   DOCUSATE SODIUM (COLACE) 100 MG CAPSULE    Take 100 mg by mouth daily.    HYDROCODONE-ACETAMINOPHEN (NORCO/VICODIN) 5-325 MG TABLET    Take 1 tablet by mouth every 4 (four) hours as needed for moderate pain. Do not exceed 4gm of Tylenol in 24 hours   LACTOBACILLUS PO    Take 1 capsule by mouth daily.   LEVOTHYROXINE (SYNTHROID, LEVOTHROID) 25 MCG TABLET    Take 25 mcg by mouth daily before breakfast.   LORAZEPAM (ATIVAN) 0.5 MG TABLET    Take 0.5 mg by mouth. At QHS and q8h PRN for anxiety & insomnia   MULTIPLE VITAMINS-MINERALS (MULTIVITAMIN WITH MINERALS) TABLET    Take 1 tablet by mouth daily. Reported on 10/13/2015 Pt takes on multivitamin   NEXIUM 24HR 20 MG  CAPSULE    Take 20 mg by mouth daily.    NYSTATIN (MYCOSTATIN) 100000 UNIT/ML SUSPENSION    Take 5 mLs (500,000 Units total) by mouth 4 (four) times daily.   POLYETHYLENE GLYCOL (MIRALAX / GLYCOLAX) PACKET    Take 17 g by mouth daily.   POLYVINYL ALCOHOL (LIQUIFILM TEARS) 1.4 % OPHTHALMIC SOLUTION    Place 1 drop into both eyes 4 (four) times daily.   PROTEIN (PROCEL PO)    Take 2 scoop by mouth 2 (two) times daily. Reported on 10/13/2015 Pt takes on protein shakes   SENNA (SENOKOT) 8.6 MG TABLET    Take 1 tablet by mouth daily.   VERAPAMIL (COVERA HS) 180 MG (CO) 24 HR TABLET    Take 1 tablet (180 mg total) by mouth at bedtime.  Modified Medications   No medications on file  Discontinued Medications   LORAZEPAM (ATIVAN) 0.5 MG TABLET    take 1  tablet by mouth every 8 hours if needed for ANXIETY, NAUSEA, OR SLEEP     Allergies  Allergen Reactions  . Other Nausea And Vomiting    Steroids - cause cramping all through the body - Headaches     REVIEW OF SYSTEMS:  GENERAL:  no fever, chills  EYES: Denies itching or discharge EARS: Denies change in hearing, ringing in ears, or earache NOSE: Denies nasal congestion or epistaxis MOUTH and THROAT: Denies oral discomfort, gingival pain or bleeding, pain from teeth or hoarseness   RESPIRATORY: no cough, SOB, DOE, wheezing, hemoptysis CARDIAC: no chest pain, edema or palpitations GI: no abdominal pain, diarrhea, constipation, heart burn, nausea or vomiting GU: Denies dysuria, frequency, hematuria, incontinence, or discharge PSYCHIATRIC:  No report of hallucinations, insomnia, paranoia, or agitation   PHYSICAL EXAMINATION  GENERAL APPEARANCE:  In no acute distress.  HEAD:  No evidence of trauma EYES: Eyes usually close but opens occasionally. No blepharitis, entropion or ectropion. Conjunctivae are clear and sclerae are white. EARS: Pinnae are normal. Patient hears normal voice tunes of the examiner MOUTH and THROAT: Lips are without  lesions. Oral mucosa is moist and without lesions. Tongue is normal in shape, size, and color and without lesions NECK: supple, trachea midline, no neck masses, no thyroid tenderness, no thyromegaly LYMPHATICS: no LAN in the neck, no supraclavicular LAN RESPIRATORY: breathing is even & unlabored, BS CTAB CARDIAC: RRR, no murmur,no extra heart sounds, no edema GI: abdomen soft, normal BS, no masses, no tenderness, no hepatomegaly, no splenomegaly EXTREMITIES:  Left side weakness; has shoulder immobilizer PSYCHIATRIC: Alert and oriented X 2,  person and place. Affect and behavior are appropriate  LABS/RADIOLOGY: Labs reviewed: Basic Metabolic Panel:  Recent Labs  10/06/15 1936 10/08/15 0546 10/10/15 0523  10/13/15 1501 10/16/15 10/18/15 1320  NA 141 137 140  < > 137 137 137  K 3.5 4.1 4.0  < > 4.0 4.1 3.9  CL 110 109 110  --   --   --   --   CO2 22 21* 21*  --  21*  --  20*  GLUCOSE 162* 131* 121*  --  115  --  108  BUN 24* 19 18  < > 18.3 20 28.7*  CREATININE 0.49* 0.50* 0.57*  < > 0.6* 0.5* 0.6*  CALCIUM 8.5* 8.2* 8.8*  --  9.3  --  9.5  MG 1.9  --   --   --   --   --   --   < > = values in this interval not displayed. Liver Function Tests:  Recent Labs  10/08/15 0546 10/13/15 1501 10/16/15 10/18/15 1320  AST 69* 55* 50* 76*  ALT 90* 98* 98* 161*  ALKPHOS 109 129 128* 144  BILITOT 0.7 0.42  --  0.62  PROT 5.3* 5.9*  --  5.9*  ALBUMIN 2.6* 2.4*  --  2.7*   CBC:  Recent Labs  10/08/15 0546 10/13/15 1501 10/16/15 10/18/15 1320  WBC 9.4 8.2 7.9 11.3*  NEUTROABS 8.5* 7.3* 6 10.0*  HGB 11.2* 12.8* 11.8* 13.6  HCT 33.3* 38.4 37* 41.8  MCV 89.3 88.9  --  87.2  PLT 119* 153 154 176   Cardiac Enzymes:  Recent Labs  10/06/15 0058 10/07/15 0524 10/07/15 1100  TROPONINI 0.04* 0.04* 0.03   CBG:  Recent Labs  07/04/15 0733 10/08/15 0733  GLUCAP 112* 95     Ct Head Wo Contrast  10/08/2015  CLINICAL DATA:  Brain tumor.  Left-sided weakness EXAM: CT HEAD  WITHOUT CONTRAST TECHNIQUE: Contiguous axial images were obtained from the base of the skull through the vertex without intravenous contrast. COMPARISON:  CT 09/19/2015 FINDINGS: Infiltrating low-density mass is seen throughout the right posterior temporal, parietal, and occipital lobes. Increased low-density edema/ tumor in the right temporal lobe and right basal ganglia. There is extensive low-density enlarging the splenium of the corpus callosum which is unchanged. Dystrophic calcifications are present within the mass as noted previously. Mass-effect on the right lateral ventricle which is compressed. 13 mm midline shift to the left shows mild progression in the interval. The ventricles are not enlarged. No acute hemorrhage. No acute infarct. Opacification of the right sphenoid sinus with mucosal thickening in the paranasal sinuses. Craniotomy changes in the right parietal region noted. IMPRESSION: Diffuse low-density mass in the right posterior cerebral hemisphere with dystrophic calcification. Increased low-density edema/tumor in the right temporal lobe and right basal ganglia. Increased mass-effect and midline shift now measuring 13 mm. No associated acute hemorrhage. Electronically Signed   By: Franchot Gallo M.D.   On: 10/08/2015 10:27   Dg Chest Port 1 View  10/06/2015  CLINICAL DATA:  Sepsis. Glioma within the occipital lobe. Decreased blood pressure. EXAM: PORTABLE CHEST 1 VIEW COMPARISON:  07/03/2015 FINDINGS: Heart size is normal. No pleural effusion identified. Bilateral interstitial opacities are identified which appear new from previous exam. No lobar consolidation. IMPRESSION: 1. Bilateral interstitial opacities which may reflect atypical infection or edema. Electronically Signed   By: Kerby Moors M.D.   On: 10/06/2015 19:03    ASSESSMENT/PLAN:  Generalized weakness - continue supportive care, fall  precaution and aspiration precaution  Glioblastoma multiforme - imaging showed midline  shift; on phase 1 vaccine trial @ [name] in December 2016, Avasti therapy @ [facility], Dexamethasone 4 mg 2 tabs = 8 mg PO QID; follows-up with Dr. Burr Medico, oncology; continue Norco 5/325 mg 1 tab PO Q 4 hours PRN and Acetaminophen 325 mg 2 tabs = 650 mg PO Q 6 H PRN for pain  Protein calorie malnutrition - continue supplementation  Headache - continue amitriptyline 25 mg 1 tab by mouth daily at bedtime and Fioricet 50-325-40-30 mg 1 capsule by mouth every 4 hours when necessary  Hypothyroidism - TSH 1.682; recently decreased level thyroxine to 25 g daily  GERD - stable; continue Nexium 24 hour 20 mg 1 capsule daily  Hypertension - continue verapamil 180 mg 1 capsule daily  Anxiety - mood is stable; continue Ativan 0.5 mg 1 tab by mouth daily at bedtime and 0.5 mg 1 tab by mouth every 8 hours when necessary  Constipation - stable; continue Colace 100 mg 1 capsule daily, Senokot 8.6 mg 1 tab daily and MiraLAX 17 g by mouth daily  Muscle spasm - continue cyclobenzaprine 10 mg 3 times a day when necessary      Goals of care:  Long-term care    [facility], NP [facility] [phone]

## 2015-10-18 NOTE — Progress Notes (Signed)
Spiritual Care Note  Met with pt, who goes by Jake [name], and wife [name] in infusion today.  [name] verbalized deep gratitude for the whole team's kind, comprehensive care.  [name] was tearful with distress/grief at [name]'s decline and the changes he is enduring.  She is struggling with guilt and views pt's suffering as much more than her own as caregiver.  Per [name], she is working 72 h/w and visiting Jake [name] before and after work.  She verbalized appreciation for chaplain voicemails of support.   We plan for me to continue to call for support/encouragement.  Also continue to coordinate support with [employer].  Please page as needs arise/circumstances change.  Thank you.  [place], [place], [facility] Pager [phone] Voicemail  [phone]

## 2015-10-18 NOTE — Progress Notes (Signed)
[place] FOLLOW UP NOTE  Patient Care Team: Truitt Merle, MD as PCP - General (Hematology) Kary Kos, MD as Consulting Physician (Neurosurgery) Eppie Gibson, MD as Attending Physician (Radiation Oncology) Truitt Merle, MD as Consulting Physician (Hematology) Meta Hatchet, LCSW as Social Worker Blanchie Serve, MD as Consulting Physician (Internal Medicine)     Glioblastoma of occipital lobe [facility]) (Resolved)   07/02/2014 Imaging Brain MRI: Two adjacent enhancing mass lesions in the right parietal lobe with central necrosis and extensive surrounding edema and mass effect.  7 mm midline shift to the left.  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   CT CAP(-)   07/07/2014 Surgery Subtotal right parietal tumors resection    08/03/2014 Concurrent Chemotherapy Concurrent radiation with Temodar 75 mg/m daily, completed on 09/15/2014.    09/29/2014 - 12/28/2014 Chemotherapy Adjuvant Temodar, 150-200mg /m2 for 5 days, every 28 day cycle, s/p 3 cycles, stopped due to disease progression.   10/14/2014 Imaging Two discrete foci of enhancement in the RIGHT occipital lobe surround the larger resection cavity, and are suspected to represent foci of residual tumor/GBM displaying interval growt   12/13/2014 Imaging Increased size/ enhancement of 2 lesions adjacent to the right occipital resection cavity, concerning for growth of residual tumor.   01/06/2015 - 05/26/2015 Chemotherapy Bevacizumab 10mg /kg every 2 weeks, stopped due to disease progression    04/18/2015 Progression Brain MRI showed progressed masslike signal abnormality in the right occipital lobe, most compatible with interval to progression of disease.   04/28/2015 - 05/26/2015 Chemotherapy Irinotecan 1 25 mg/m, every 2 weeks, was added on to Avastin due to disease progression. Stopped due to disease progression.    06/02/2015 Progression Brain MRI showed further increased masslike T2 hyperintensity and irregular enhancement in the  right occipital and posterior right temporal lobes since August again suggesting true progression of disease. Intracranial mass effect is stable.   07/03/2015 - 07/04/2015 Hospital Admission He was admitted for worsening headaches and fever. ID workup was negative. His dexamethasone was increased, and his headaches are much improved. He was discharged home Today.   08/22/2015 Procedure Admission to [name] for AIRO guided right tempora sterostatic brain biopsy with placement of two therapeutic catheters. The frozen pathology showed recurrent malignant glioma. Infusion of D2C7 vaccine was started on 08/22/15. Catheters removed 12/26   09/19/2015 - 09/22/2015 Hospital Admission Intractable headache with worsening brain mass with midline shift. Discharged with steroids to rehab facility.    09/20/2015 Imaging Significant worsening of mass like T2 hyperintensity in the right hemisphere, with new extensive involvement of the splenium of the corpus callosum which tracks across midline to the left. Subsequent increased intracranial mass effect with leftward mid   09/27/2015 -  Chemotherapy Avastin 7.5mg /m2 every 3 weeks    10/06/2015 - 10/10/2015 Hospital Admission Pt was admitted for acute encephalopathy and healthcare associated pneumonia. Repeat CT head showed worsening edema around tumor, and worsening midline shift. He was treated with antibiotics and discharged back to SNF    GBM (glioblastoma multiforme) ([place] [facility])   07/22/2014 Initial Diagnosis GBM (glioblastoma multiforme)    CURRENT THERAPY: 1. [name] phase 1 clinical trail D2C7 vaccine started on 08/22/2015 2. Bevacizumab 7.5mg /kg every 3 weeks, started on 09/27/2015 3. Dexa increased to 6mg  q6h on 09/19/15, decreased to 4mg  q8h on 09/27/15, increased to 10mg  q6h on 2/6, then 8mg  q6h on 2/8 and 8mg  tid from 10/18/15    CHIEF COMPLAINTS: follow up GBM   HISTORY OF INITIAL PRESENTATION;  He initially presented was headaches and sinus congestion in mid October 2015.  He was seen by his primary care physician and was treated for sinus infection. He subsequently developed left sided visual deficit, left arm and leg weakness and unsteady gait. He was sent to emergency room by his primary care physician on 07/02/2014. CT  And MRI of brain reviewed to add adjacent enhancing mass lesions in the right parietal lobe with central necrosis and extensive surrounding edema and mass effect. He was admitted and underwent right craniotomy  For resection of the 2 brain masses by Dr. Lonell Grandchild. Postsurgical brain MRI revealed a 1.3 cm area of enhancement at the anterior margin of surgical resection cavity, likely surgical change. His case was reviewed in the tumor board and it was felt he had subtotal resection.  INTERVAL HISTROY: Jake [name] returns for follow-up. He was brought in by an ambulance from his nursing home, and he is also a comment by his wife to clinic today. No significant change since last Friday when I saw him. He still has intermittent confusion, but still oriented to people and the place most of time. He had not complained much headaches, his left side is very weak, he can barely move his left arm and leg. The facility has stopped his physical therapy last week, since he is not able to participate in March. He is on pured food, occasional cough, no fever or chills, except one episode of fever last Saturday which resolved quickly. His appetite seems to be good.    MEDICAL HISTORY:  Past Medical History  Diagnosis Date  . Thyroid disease   . Hypertension   . Allergy   . Arthritis   .    Marland Kitchen Kidney stone     SURGICAL HISTORY: Past Surgical History  Procedure Laterality Date  . Knee arthroscopy w/ meniscal repair    . Treatment fistula anal    . 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 Marital Status: Married    Spouse Name: N/A    Number of Children: 5 children, age of 18-21  . Years of Education: N/A   Occupational History  . 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    Social History Main Topics  . Smoking status: Current Every Day Smoker -- 1.00 packs/day for 45 years    Types: Cigarettes  . Smokeless tobacco: Never Used  . Alcohol Use: Yes     Comment: 2-3 beers per/day   . Drug Use: No  . Sexual Activity: Not on file     FAMILY HISTORY: Family History  Problem Relation Age of Onset  . Hypertension Mother   . Diabetes Father   . Glaucoma Father   . Diabetes Brother   . Diabetes Brother   . Colon cancer Neg Hx   Paternal ancle had prostate cancer. No other family history of malignancy.  ALLERGIES:  is allergic to other.   MEDICATIONS:  Current Outpatient Prescriptions  Medication Sig Dispense Refill  . acetaminophen (TYLENOL) 325 MG tablet Take 2 tablets (650 mg total) by mouth every 6 (six) hours as needed for mild pain (or Fever >/= 101). (Patient not taking: Reported on 10/13/2015) 30 tablet 0  . amitriptyline (ELAVIL) 25 MG tablet Take 25 mg by mouth at bedtime.     Marland Kitchen  butalbital-acetaminophen-caffeine (FIORICET WITH CODEINE) 50-325-40-30 MG capsule Take 1 capsule by mouth every 4 (four) hours as needed for headache.    . chlorhexidine (PERIDEX) 0.12 % solution Rinse with 15 mls three daily for 30 seconds. Use after breakfast, lunch and at bedtime. Spit out excess. Do not swallow. 1440 mL prn  . cyclobenzaprine (FLEXERIL) 10 MG tablet Take 1 tablet (10 mg total) by mouth 3 (three) times daily as needed for muscle spasms. 30 tablet 0  . dexamethasone (DECADRON) 4 MG tablet Take 2 tablets (8 mg total) by mouth 4 (four) times daily. 42 tablet 0  . docusate sodium (COLACE) 100 MG capsule Take 100 mg by mouth daily.     Marland Kitchen HYDROcodone-acetaminophen (NORCO/VICODIN) 5-325 MG tablet Take 1 tablet by mouth every 4 (four) hours as  needed for moderate pain. Do not exceed 4gm of Tylenol in 24 hours 180 tablet 0  . LACTOBACILLUS PO Take 1 capsule by mouth daily.    Marland Kitchen levothyroxine (SYNTHROID, LEVOTHROID) 25 MCG tablet Take 25 mcg by mouth daily before breakfast.    . LORazepam (ATIVAN) 0.5 MG tablet take 1  tablet by mouth every 8 hours if needed for ANXIETY, NAUSEA, OR SLEEP 90 tablet 0  . Multiple Vitamins-Minerals (MULTIVITAMIN WITH MINERALS) tablet Take 1 tablet by mouth daily. Reported on 10/13/2015 Pt takes on multivitamen    . NEXIUM 24HR 20 MG capsule Take 20 mg by mouth daily.   0  . nystatin (MYCOSTATIN) 100000 UNIT/ML suspension Take 5 mLs (500,000 Units total) by mouth 4 (four) times daily. 473 mL 1  . polyethylene glycol (MIRALAX / GLYCOLAX) packet Take 17 g by mouth daily.    . polyvinyl alcohol (LIQUIFILM TEARS) 1.4 % ophthalmic solution Place 1 drop into both eyes 4 (four) times daily.    . Protein (PROCEL PO) Take by mouth 2 (two) times daily. Reported on 10/13/2015 Pt takes on protein shakes    . senna (SENOKOT) 8.6 MG tablet Take 1 tablet by mouth daily.    . verapamil (COVERA HS) 180 MG (CO) 24 hr tablet Take 1 tablet (180 mg total) by mouth at bedtime. 90 tablet 3   No current facility-administered medications for this visit.   Facility-Administered Medications Ordered in Other Visits  Medication Dose Route Frequency Provider Last Rate Last Dose  . 0.9 %  sodium chloride infusion   Intravenous Once Truitt Merle, MD      . bevacizumab (AVASTIN) 700 mg in sodium chloride 0.9 % 100 mL chemo infusion  7.5 mg/kg (Treatment Plan Actual) Intravenous Once Truitt Merle, MD        REVIEW OF SYSTEMS:   Constitutional: Denies fevers, chills or abnormal night sweats Eyes: Denies blurriness of vision, double vision or watery eyes Ears, nose, mouth, throat, and face: Denies mucositis or sore throat Respiratory: Denies cough, dyspnea or wheezes Cardiovascular: Denies palpitation, chest discomfort or lower extremity  swelling Gastrointestinal:  Denies nausea, heartburn or change in bowel habits Skin: Denies abnormal skin rashes Lymphatics: Denies new lymphadenopathy or easy bruising Neurological:positive for mild headaches, and left side vision deficits. His left-sided weakness has nearly resolved. Denies numbness, tingling or new weaknesses Behavioral/Psych: Mood is stable, no new changes  All other systems were reviewed with the patient and are negative.  PHYSICAL EXAMINATION: ECOG PERFORMANCE STATUS: 1 - Symptomatic but completely ambulatory  KPS: 30-40% Blood pressure 118/83, heart rate 16, respiratory rate 18, temperature 97.7, pulse ox 94% GENERAL:lying in the infusion bed, drowsy, oriented to person, able  to answer some simple questions and follow commands. SKIN: skin color, texture, turgor are normal, no rashes or significant lesions, small bruises on both arms HEAD: right-sided posterior craniotomy surgical wound is healing well, no surrounding edema, skin redness or discharge EYES: normal, conjunctiva are pink and non-injected, sclera clear OROPHARYNX:no exudate, no erythema and lips, buccal mucosa, no oral thrush NECK: supple, thyroid normal size, non-tender, without nodularity LYMPH:  no palpable lymphadenopathy in the cervical, axillary or inguinal LUNGS: clear to auscultation and percussion with normal breathing effort HEART: regular rate & rhythm and no murmurs and no lower extremity edema ABDOMEN:abdomen soft, non-tender and normal bowel sounds Musculoskeletal:no cyanosis of digits and no clubbing  PSYCH: alert & oriented x 3 with fluent speech NEURO: cranial nerve 2-12 are intact, left UE and LE strength is significantly low, not able to grab   LABORATORY DATA:  CBC Latest Ref Rng 10/18/2015 10/13/2015 10/08/2015  WBC 4.0 - 10.3 10e3/uL 11.3(H) 8.2 9.4  Hemoglobin 13.0 - 17.1 g/dL 13.6 12.8(L) 11.2(L)  Hematocrit 38.4 - 49.9 % 41.8 38.4 33.3(L)  Platelets 140 - 400 10e3/uL 176 153  119(L)    CMP Latest Ref Rng 10/18/2015 10/13/2015 10/10/2015  Glucose 70 - 140 mg/dl 108 115 121(H)  BUN 7.0 - 26.0 mg/dL 28.7(H) 18.3 18  Creatinine 0.7 - 1.3 mg/dL 0.6(L) 0.6(L) 0.57(L)  Sodium 136 - 145 mEq/L 137 137 140  Potassium 3.5 - 5.1 mEq/L 3.9 4.0 4.0  Chloride 101 - 111 mmol/L - - 110  CO2 22 - 29 mEq/L 20(L) 21(L) 21(L)  Calcium 8.4 - 10.4 mg/dL 9.5 9.3 8.8(L)  Total Protein 6.4 - 8.3 g/dL 5.9(L) 5.9(L) -  Total Bilirubin 0.20 - 1.20 mg/dL 0.62 0.42 -  Alkaline Phos 40 - 150 U/L 144 129 -  AST 5 - 34 U/L 76(H) 55(H) -  ALT 0 - 55 U/L 161(H) 98(H) -     Surgical path 07/07/2014  Diagnosis 1. Brain, biopsy - GLIOBLASTOMA MULTIFORME, WHO GRADE IV/IV. - SEE ONCOLOGY TABLE BELOW. 2. Brain, for tumor resection, higher right parietal - GLIOBLASTOMA MULTIFORME, WHO GRADE IV/IV. - SEE ONCOLOGY TABLE BELOW. 3. Brain, for tumor resection, lower right occipital - GLIOBLASTOMA MULTIFORME, WHO GRADE IV/IV   Microscopic Comment 1. -3. ONCOLOGY TABLE - BRAIN AND SPINAL CORD 1. Procedure: Resection x2 2. Tumor site, including laterality: Right parietal and right occipital 3. Maximum tumor size (cm): At least 1.8 cm 4. Histologic type: Glioblastoma multiforme 5. Grade: WHO Grade IV/IV 6. Margins (if applicable): Can not be assessed 7. Ancillary studies: Can be performed upon clinician request.  RADIOGRAPHIC STUDIES: I have personally reviewed the radiological images as listed and agreed with the findings in the report.  [name] Wo Contrast 09/21/2015 IMPRESSION: Significant worsening of mass like T2 hyperintensity in the right hemisphere, with new extensive involvement of the splenium of the corpus callosum which tracks across midline to the left. Subsequent increased intracranial mass effect with leftward midline shift now 8-9 mm (4 mm in October). Suspected early trapping of the left lateral ventricle.  True progression of disease was strongly favored in  October considering the patient had not yet begun new therapy at that time. However, if the patient has undergone interval vaccination/immune therapy at [facility] then there is a chance that these changes since October are treatment effect.  No new intracranial abnormality.  ASSESSMENT & PLAN:  Mr. Gignac is a 60 year old gentleman with newly diagnosed right parietal GBM, status post subtotal resection.   1.  GBM, s/p subtotal resection, and concurrent chemoRT completed on 09/15/2014, progressed on adjuvant Temodar.  -He started the phase 1 vaccine clinical trial at at [facility] in December, his neurological function has deteriorated significantly -He has significant intracranial mass effect with left midline shift, and surrounding edema -His overall outcome is rather poor. I previously discussed continuing Avastin, versus considering palliative care alone and hospice with pt and his wife, giving his quick deterioration lately. Patient's wife voiced good understanding about the overall poor prognosis, and wishes to continue Avastin therapy for now.  -I also spoke with Dr. Isaac Laud at [facility] and updated her about his condition on 2/6, and she recommends continue Avastin for now, and watch for little longer before call hospice -Lab results reviewed with patient and his wife, we'll proceed cycle 2 of Avastin today -He has not had much headaches, will decrease his dexamethasone to 8 mg every 8 hours until next visit in 2 weeks  -We discussed fall precaution, aspiration precaution, watch for sign of infection, and frequent turn to prevent decubitus ulcer etc.  2. Recent episode of healthcare associated pneumonia, ? Aspiration pneumonia -He has completed a course of antibiotics -He is afebrile, vital signs stable. I discussed aspiration precaution with patient's wife  3. Generalized weakness, neurological deterioration -overall worse lately -nutrition supplement and supportive care -fall precaution  3.  HTN and hypothyroidism  -We'll hold lisinopril for now, giving his episode of hypertension last week, and overall deconditioning  4. Transaminitis -He has elevated ALT and AST, trending up, etiology unclear, medication related?  -will continue monitoring  -avoid tylenol more than 1 tab a day   4. Code status and goal of care  -He is DO NOT RESUSCITATE and DO NOT INTUBATE -pt and his wife agree with palliative care as goal of care  -Possible hospice if his neurological function continued to deteriorate in the next 2-3 weeks  Plan: -decrease dexamethasone to 8 mg every 8 hours -continue holding lisinopril -Frequent turn every 2 hours, to prevent decubitus ulcer -Aspiration and fall precautions -He will return for follow up in 2 weeks  -The above recommendations were communicated with his nursing home by phone and in written. I will also copy his MD Dr. Bubba Camp who sees him at the SNF   All questions were answered. The patient knows to call the clinic with any problems, questions or concerns.  I spent 20 minutes counseling the patient face to face. The total time spent in the appointmen was 30 minutes and more than 50% was on counseling.     Truitt Merle, MD 10/18/2015

## 2015-10-18 NOTE — Progress Notes (Signed)
Brief nutrition follow-up with patient and wife. Patient is sipping on orgain nutrition supplement. Wife reports patient does not enjoy the pured diet [facility]. He does have pain.  He is on pain medication. Patient's wife requesting samples and coupons.  Nutrition diagnosis: Unintended weight loss improved.  Intervention: Encouraged patient's wife to contact RD or dietary manager at [facility] to answer questions about their pured diet and how it is prepared. Encouraged her to continue oral nutrition supplements as patient desires. Educated her about a new organic supplement called ENU and provided samples and coupons. Wife was appreciative and thanked me for support.  Monitoring, evaluation, goals: Will continue to follow patient as needed.  No follow-up scheduled at this time.  **Disclaimer: This note was dictated with voice recognition software. Similar sounding words can inadvertently be transcribed and this note may contain transcription errors which may not have been corrected upon publication of note.**

## 2015-10-18 NOTE — Progress Notes (Signed)
Faxed order for PT to Jake [name] @ [facility]  -    Fax     [phone].

## 2015-10-18 NOTE — Telephone Encounter (Signed)
per pof to sch pt appt per Dr Alphonzo Cruise see in infusion

## 2015-10-18 NOTE — Patient Instructions (Signed)
[facility] Discharge Instructions for Patients Receiving Chemotherapy  Today you received the following chemotherapy agents: Avastin  To help prevent nausea and vomiting after your treatment, we encourage you to take your nausea medication. If you develop nausea and vomiting that is not controlled by your nausea medication, call the clinic.   BELOW ARE SYMPTOMS THAT SHOULD BE REPORTED IMMEDIATELY:  *FEVER GREATER THAN 100.5 F  *CHILLS WITH OR WITHOUT FEVER  NAUSEA AND VOMITING THAT IS NOT CONTROLLED WITH YOUR NAUSEA MEDICATION  *UNUSUAL SHORTNESS OF BREATH  *UNUSUAL BRUISING OR BLEEDING  TENDERNESS IN MOUTH AND THROAT WITH OR WITHOUT PRESENCE OF ULCERS  *URINARY PROBLEMS  *BOWEL PROBLEMS  UNUSUAL RASH Items with * indicate a potential emergency and should be followed up as soon as possible.  Feel free to call the clinic you have any questions or concerns. The clinic phone number is [phone].  Please show the CHEMO ALERT CARD at check-in to the Emergency Department and triage nurse.   

## 2015-10-19 NOTE — Addendum Note (Signed)
Addended by: Elray Buba LE on: 10/19/2015 05:12 PM   Modules accepted: Medications

## 2015-10-23 ENCOUNTER — Encounter: Payer: Self-pay | Admitting: General Practice

## 2015-10-23 NOTE — Progress Notes (Signed)
Spiritual Care Note  Left spouse [name] a VM of support and encouragement per our agreement.  Also plan to mail a handwritten note of care to [place] at [facility]  Following for spiritual and emotional support, but please also page as needs arise.  Thank you.  [place], [place], [facility] Pager [phone] Voicemail  [phone]

## 2015-10-26 ENCOUNTER — Other Ambulatory Visit: Payer: Self-pay | Admitting: Hematology

## 2015-10-26 ENCOUNTER — Telehealth: Payer: Self-pay | Admitting: Hematology

## 2015-10-26 ENCOUNTER — Telehealth: Payer: Self-pay | Admitting: *Deleted

## 2015-10-26 NOTE — Telephone Encounter (Signed)
Message left on Aimees mobile # OK to swallow nystatin & to see if MD at facility can see Mr [place].  Dr Burr Medico will look at appts & someone should call her soon with these.

## 2015-10-26 NOTE — Telephone Encounter (Signed)
Please let pt's wife know that it's OK to swallow the Nystatin which is probably better. Please also ask if the MD at his facility can see him if needed.  Truitt Merle  10/26/2015

## 2015-10-26 NOTE — Telephone Encounter (Signed)
Patient's wife called stating that patient has sore throat which has gotten worse (he had this when he was last seen by Dr. Burr Medico). She wants to know if he can take the Nystatin for this and just swallow the medication to help with the sore throat.

## 2015-10-26 NOTE — Telephone Encounter (Signed)
I called pt's wife [name] and she updated me his condition. He is eating well, no cough, choking, fever or other new complaints. His headache has resolved, he still has moderate pain at the tailbone.   I recommended him to decrease dexamethasone from 8 mg every 8 hours to 8 mg in the morning and afternoon, and 4 mg in the evening. I will send a affects to his nursing home and copy Dr. Guinevere Ferrari.  I will see him back with lab and avastin infusion on 3/8.   Jake [name]  10/26/2015

## 2015-10-27 ENCOUNTER — Telehealth: Payer: Self-pay | Admitting: Hematology

## 2015-10-27 NOTE — Telephone Encounter (Signed)
Lvm re appt 3/10 @ 1.45pm.

## 2015-10-30 ENCOUNTER — Encounter: Payer: Self-pay | Admitting: *Deleted

## 2015-10-30 NOTE — Progress Notes (Signed)
[name]  Clinical Social Work contacted pt's wife, Jake [name] to check in, offer support and reassess needs. She reports she is currently sick, but appreciated check in. She reports her mother has been checking in on Jake [name] at [facility]. She states pt is very weak, but "there mentally". She denied needs, but will reach out as needed. CSW will continue to follow and assist as needed.   Clinical Social Work interventions: Support  Loren Racer, [place] Worker [place]  [place] Phone: [phone] Fax: [phone]

## 2015-11-02 ENCOUNTER — Encounter (HOSPITAL_COMMUNITY): Payer: Self-pay | Admitting: Internal Medicine

## 2015-11-02 ENCOUNTER — Emergency Department (HOSPITAL_COMMUNITY): Payer: 59

## 2015-11-02 ENCOUNTER — Inpatient Hospital Stay (HOSPITAL_COMMUNITY)
Admission: EM | Admit: 2015-11-02 | Discharge: 2015-12-02 | DRG: 054 | Disposition: E | Payer: 59 | Attending: Internal Medicine | Admitting: Internal Medicine

## 2015-11-02 DIAGNOSIS — Z8249 Family history of ischemic heart disease and other diseases of the circulatory system: Secondary | ICD-10-CM | POA: Diagnosis not present

## 2015-11-02 DIAGNOSIS — G8194 Hemiplegia, unspecified affecting left nondominant side: Secondary | ICD-10-CM | POA: Diagnosis present

## 2015-11-02 DIAGNOSIS — E039 Hypothyroidism, unspecified: Secondary | ICD-10-CM | POA: Diagnosis present

## 2015-11-02 DIAGNOSIS — E43 Unspecified severe protein-calorie malnutrition: Secondary | ICD-10-CM | POA: Diagnosis present

## 2015-11-02 DIAGNOSIS — Z8701 Personal history of pneumonia (recurrent): Secondary | ICD-10-CM | POA: Diagnosis not present

## 2015-11-02 DIAGNOSIS — Z66 Do not resuscitate: Secondary | ICD-10-CM | POA: Diagnosis present

## 2015-11-02 DIAGNOSIS — F17210 Nicotine dependence, cigarettes, uncomplicated: Secondary | ICD-10-CM | POA: Diagnosis present

## 2015-11-02 DIAGNOSIS — E038 Other specified hypothyroidism: Secondary | ICD-10-CM | POA: Diagnosis not present

## 2015-11-02 DIAGNOSIS — G9340 Encephalopathy, unspecified: Secondary | ICD-10-CM | POA: Diagnosis present

## 2015-11-02 DIAGNOSIS — I445 Left posterior fascicular block: Secondary | ICD-10-CM | POA: Diagnosis present

## 2015-11-02 DIAGNOSIS — R4182 Altered mental status, unspecified: Secondary | ICD-10-CM | POA: Diagnosis not present

## 2015-11-02 DIAGNOSIS — R531 Weakness: Secondary | ICD-10-CM | POA: Diagnosis present

## 2015-11-02 DIAGNOSIS — C719 Malignant neoplasm of brain, unspecified: Secondary | ICD-10-CM | POA: Diagnosis present

## 2015-11-02 DIAGNOSIS — Z888 Allergy status to other drugs, medicaments and biological substances status: Secondary | ICD-10-CM

## 2015-11-02 DIAGNOSIS — Z79899 Other long term (current) drug therapy: Secondary | ICD-10-CM | POA: Diagnosis not present

## 2015-11-02 DIAGNOSIS — M549 Dorsalgia, unspecified: Secondary | ICD-10-CM

## 2015-11-02 DIAGNOSIS — G936 Cerebral edema: Secondary | ICD-10-CM | POA: Diagnosis present

## 2015-11-02 DIAGNOSIS — Z833 Family history of diabetes mellitus: Secondary | ICD-10-CM | POA: Diagnosis not present

## 2015-11-02 DIAGNOSIS — Z6835 Body mass index (BMI) 35.0-35.9, adult: Secondary | ICD-10-CM | POA: Diagnosis not present

## 2015-11-02 DIAGNOSIS — Z515 Encounter for palliative care: Secondary | ICD-10-CM | POA: Diagnosis present

## 2015-11-02 DIAGNOSIS — E079 Disorder of thyroid, unspecified: Secondary | ICD-10-CM | POA: Diagnosis present

## 2015-11-02 DIAGNOSIS — D696 Thrombocytopenia, unspecified: Secondary | ICD-10-CM | POA: Diagnosis present

## 2015-11-02 DIAGNOSIS — J690 Pneumonitis due to inhalation of food and vomit: Secondary | ICD-10-CM | POA: Diagnosis present

## 2015-11-02 DIAGNOSIS — Z7952 Long term (current) use of systemic steroids: Secondary | ICD-10-CM

## 2015-11-02 DIAGNOSIS — R740 Nonspecific elevation of levels of transaminase and lactic acid dehydrogenase [LDH]: Secondary | ICD-10-CM | POA: Diagnosis present

## 2015-11-02 DIAGNOSIS — I10 Essential (primary) hypertension: Secondary | ICD-10-CM | POA: Diagnosis present

## 2015-11-02 DIAGNOSIS — E669 Obesity, unspecified: Secondary | ICD-10-CM | POA: Diagnosis present

## 2015-11-02 DIAGNOSIS — Z83511 Family history of glaucoma: Secondary | ICD-10-CM | POA: Diagnosis not present

## 2015-11-02 DIAGNOSIS — G935 Compression of brain: Secondary | ICD-10-CM | POA: Diagnosis present

## 2015-11-02 DIAGNOSIS — R5381 Other malaise: Secondary | ICD-10-CM | POA: Diagnosis present

## 2015-11-02 LAB — CBC
HEMATOCRIT: 43.8 % (ref 39.0–52.0)
Hemoglobin: 13.6 g/dL (ref 13.0–17.0)
MCH: 29.4 pg (ref 26.0–34.0)
MCHC: 31.1 g/dL (ref 30.0–36.0)
MCV: 94.6 fL (ref 78.0–100.0)
PLATELETS: 142 10*3/uL — AB (ref 150–400)
RBC: 4.63 MIL/uL (ref 4.22–5.81)
RDW: 20.4 % — AB (ref 11.5–15.5)
WBC: 8.8 10*3/uL (ref 4.0–10.5)

## 2015-11-02 LAB — COMPREHENSIVE METABOLIC PANEL
ALBUMIN: 2.9 g/dL — AB (ref 3.5–5.0)
ALT: 148 U/L — AB (ref 17–63)
AST: 67 U/L — AB (ref 15–41)
Alkaline Phosphatase: 155 U/L — ABNORMAL HIGH (ref 38–126)
Anion gap: 11 (ref 5–15)
BUN: 32 mg/dL — AB (ref 6–20)
CHLORIDE: 110 mmol/L (ref 101–111)
CO2: 22 mmol/L (ref 22–32)
CREATININE: 0.63 mg/dL (ref 0.61–1.24)
Calcium: 9.1 mg/dL (ref 8.9–10.3)
GFR calc Af Amer: >60 mL/min (ref >60)
GLUCOSE: 122 mg/dL — AB (ref 65–99)
POTASSIUM: 3.7 mmol/L (ref 3.5–5.1)
SODIUM: 143 mmol/L (ref 135–145)
Total Bilirubin: 1 mg/dL (ref 0.3–1.2)
Total Protein: 5.8 g/dL — ABNORMAL LOW (ref 6.5–8.1)

## 2015-11-02 LAB — URINALYSIS, ROUTINE W REFLEX MICROSCOPIC
GLUCOSE, UA: NEGATIVE mg/dL
KETONES UR: NEGATIVE mg/dL
Leukocytes, UA: NEGATIVE
Nitrite: NEGATIVE
PH: 6 (ref 5.0–8.0)
Protein, ur: NEGATIVE mg/dL
SPECIFIC GRAVITY, URINE: 1.02 (ref 1.005–1.030)

## 2015-11-02 LAB — URINE MICROSCOPIC-ADD ON: Bacteria, UA: NONE SEEN

## 2015-11-02 LAB — CBG MONITORING, ED: Glucose-Capillary: 98 mg/dL (ref 65–99)

## 2015-11-02 LAB — I-STAT CG4 LACTIC ACID, ED
LACTIC ACID, VENOUS: 1.96 mmol/L (ref 0.5–2.0)
Lactic Acid, Venous: 1.44 mmol/L (ref 0.5–2.0)

## 2015-11-02 MED ORDER — ACETAMINOPHEN 650 MG RE SUPP: 650.0000 mg | Freq: Four times a day (QID) | RECTAL | Status: DC | PRN

## 2015-11-02 MED ORDER — ONDANSETRON HCL 4 MG/2ML IJ SOLN: 4.0000 mg | Freq: Four times a day (QID) | INTRAMUSCULAR | Status: DC | PRN

## 2015-11-02 MED ORDER — DEXAMETHASONE SODIUM PHOSPHATE 10 MG/ML IJ SOLN
10.0000 mg | Freq: Once | INTRAMUSCULAR | Status: AC
  Administered 2015-11-02: 10 mg via INTRAVENOUS
  Filled 2015-11-02: qty 1

## 2015-11-02 MED ORDER — SODIUM CHLORIDE 0.9 % IV SOLN
INTRAVENOUS | Status: AC
  Administered 2015-11-02: 23:00:00 via INTRAVENOUS

## 2015-11-02 MED ORDER — DEXAMETHASONE SODIUM PHOSPHATE 10 MG/ML IJ SOLN
10.0000 mg | Freq: Four times a day (QID) | INTRAMUSCULAR | Status: DC
  Administered 2015-11-02 – 2015-11-07 (×19): 10 mg via INTRAVENOUS
  Filled 2015-11-02 (×19): qty 1

## 2015-11-02 MED ORDER — ACETAMINOPHEN 325 MG PO TABS: 650.0000 mg | ORAL_TABLET | Freq: Four times a day (QID) | ORAL | Status: DC | PRN

## 2015-11-02 MED ORDER — MORPHINE SULFATE (PF) 2 MG/ML IV SOLN
2.0000 mg | INTRAVENOUS | Status: DC | PRN
  Administered 2015-11-02 – 2015-11-05 (×6): 2 mg via INTRAVENOUS
  Filled 2015-11-02 (×8): qty 1

## 2015-11-02 MED ORDER — VANCOMYCIN HCL IN DEXTROSE 1-5 GM/200ML-% IV SOLN
1000.0000 mg | Freq: Three times a day (TID) | INTRAVENOUS | Status: DC
  Administered 2015-11-02 – 2015-11-04 (×7): 1000 mg via INTRAVENOUS
  Filled 2015-11-02 (×6): qty 200

## 2015-11-02 MED ORDER — ALBUTEROL SULFATE (2.5 MG/3ML) 0.083% IN NEBU: 2.5000 mg | INHALATION_SOLUTION | RESPIRATORY_TRACT | Status: DC | PRN

## 2015-11-02 MED ORDER — PIPERACILLIN-TAZOBACTAM 3.375 G IVPB
3.3750 g | Freq: Three times a day (TID) | INTRAVENOUS | Status: DC
  Administered 2015-11-02 – 2015-11-07 (×14): 3.375 g via INTRAVENOUS
  Filled 2015-11-02 (×13): qty 50

## 2015-11-02 MED ORDER — SODIUM CHLORIDE 0.9 % IV BOLUS (SEPSIS)
1000.0000 mL | Freq: Once | INTRAVENOUS | Status: AC
  Administered 2015-11-02: 1000 mL via INTRAVENOUS

## 2015-11-02 MED ORDER — ONDANSETRON HCL 4 MG PO TABS: 4.0000 mg | ORAL_TABLET | Freq: Four times a day (QID) | ORAL | Status: DC | PRN

## 2015-11-02 NOTE — ED Provider Notes (Signed)
CSN: [id number]     Arrival date & time 11/11/2015  1403 History   First MD Initiated Contact with Patient 11/23/2015 1501     Chief Complaint  Patient presents with  . Altered Mental Status   (Consider location/radiation/quality/duration/timing/severity/associated sxs/prior Treatment) HPI 60 y.o. male with a hx of Right sided parietal/occipital Glioblastoma s/p Craniotomy, presents to the Emergency Department today due to decline in cognition over the past week. Upon speaking with nursing facility, the patient has been having a decrease in mental status. Usually communicates verbally and is alert and oriented. Notes that patient was unable to communicate this morning/nursing noted decrease in PO intake and having to crush tablets in order for patient to take medications. Notes decrease in swallowing ability. Nursing states that he has been unable to tolerate PT due to generalized weakness. Pt found to be sleeping more frequently. Patient does not ambulate at baseline. No signs of CP/SOB/ABD pain. No N/V/D. No fevers per nursing. No other symptoms reported.  Right sided Craniotomy due to Glioblastoma on 07/07/2014. Recent Oncology visit on 10-18-15 with Dr. Truitt Merle for follow up.   HPI obtained from Nurse at [facility] as well as Step-Mother  Past Medical History  Diagnosis Date  . Thyroid disease   . Hypertension   . Allergy   . Arthritis   . Sleep apnea   . Kidney stone   . Glioblastoma multiforme of occipital lobe ([place])   . Nocturnal leg cramps 09/29/2014  . History of radiation therapy 08/03/14- 09/15/14    right occipito-parietal brain/46 Gy 23 fx, right brain boost/ 14 Gy in 7 fx   Past Surgical History  Procedure Laterality Date  . Knee arthroscopy w/ meniscal repair    . Treatment fistula anal    . Craniotomy N/A 07/07/2014    Procedure: CRANIOTOMY TUMOR EXCISION/CURVE;  Surgeon: Elaina Hoops, MD;  Location: [place] [facility];  Service: Neurosurgery;  Laterality: N/A;  . Brain surgery      Family History  Problem Relation Age of Onset  . Hypertension Mother   . Diabetes Father   . Glaucoma Father   . Diabetes Brother   . Diabetes Brother   . Colon cancer Neg Hx    Social History  Substance Use Topics  . Smoking status: Current Every Day Smoker -- 0.50 packs/day for 45 years    Types: Cigarettes  . Smokeless tobacco: Never Used  . Alcohol Use: 0.0 oz/week    0 Standard drinks or equivalent per week     Comment: 2-3 beers per/day     Review of Systems  Unable to perform ROS: Mental status change   Allergies  Other  Home Medications   Prior to Admission medications   Medication Sig Start Date End Date Taking? Authorizing Provider  acetaminophen (TYLENOL) 325 MG tablet Take 2 tablets (650 mg total) by mouth every 6 (six) hours as needed for mild pain (or Fever >/= 101). 07/04/15   Robbie Lis, MD  amitriptyline (ELAVIL) 25 MG tablet Take 25 mg by mouth at bedtime.  06/30/15   Historical Provider, MD  butalbital-acetaminophen-caffeine (FIORICET WITH CODEINE) 50-325-40-30 MG capsule Take 1 capsule by mouth every 4 (four) hours as needed for headache.    Historical Provider, MD  calcium carbonate (TUMS EX) 750 MG chewable tablet Chew 2 tablets by mouth 2 (two) times daily.    Historical Provider, MD  chlorhexidine (PERIDEX) 0.12 % solution Rinse with 15 mls three daily for 30 seconds. Use after  breakfast, lunch and at bedtime. Spit out excess. Do not swallow. 05/10/15   Lenn Cal, DDS  cyclobenzaprine (FLEXERIL) 10 MG tablet Take 1 tablet (10 mg total) by mouth 3 (three) times daily as needed for muscle spasms. 09/22/15   Velvet Bathe, MD  dexamethasone (DECADRON) 4 MG tablet Take 2 tablets (8 mg total) by mouth 4 (four) times daily. 10/10/15   Lavina Hamman, MD  docusate sodium (COLACE) 100 MG capsule Take 100 mg by mouth daily.     Historical Provider, MD  HYDROcodone-acetaminophen (NORCO/VICODIN) 5-325 MG tablet Take 1 tablet by mouth every 4 (four) hours as  needed for moderate pain. Do not exceed 4gm of Tylenol in 24 hours 10/11/15   Gildardo Cranker, DO  LACTOBACILLUS PO Take 1 capsule by mouth daily.    Historical Provider, MD  levothyroxine (SYNTHROID, LEVOTHROID) 25 MCG tablet Take 25 mcg by mouth daily before breakfast.    Historical Provider, MD  LORazepam (ATIVAN) 0.5 MG tablet Take 0.5 mg by mouth. At QHS and q8h PRN for anxiety & insomnia    Historical Provider, MD  Multiple Vitamins-Minerals (MULTIVITAMIN WITH MINERALS) tablet Take 1 tablet by mouth daily. Reported on 10/13/2015 Pt takes on multivitamin    Historical Provider, MD  NEXIUM 24HR 20 MG capsule Take 20 mg by mouth daily.  10/21/14   Historical Provider, MD  nystatin (MYCOSTATIN) 100000 UNIT/ML suspension Take 5 mLs (500,000 Units total) by mouth 4 (four) times daily. 07/07/15   Truitt Merle, MD  polyethylene glycol [facility]aveling) packet Take 17 g by mouth daily.    Historical Provider, MD  polyvinyl alcohol (LIQUIFILM TEARS) 1.4 % ophthalmic solution Place 1 drop into both eyes 4 (four) times daily.    Historical Provider, MD  Protein (PROCEL PO) Take 2 scoop by mouth 2 (two) times daily. Reported on 10/13/2015 Pt takes on protein shakes    Historical Provider, MD  senna (SENOKOT) 8.6 MG tablet Take 1 tablet by mouth daily.    Historical Provider, MD  verapamil (COVERA HS) 180 MG (CO) 24 hr tablet Take 1 tablet (180 mg total) by mouth at bedtime. 03/04/15   Shawnee Knapp, MD   BP 125/91 mmHg  Pulse 103  Temp(Src) 99.1 F (37.3 C) (Rectal)  Resp 15  SpO2 94%   Physical Exam  Constitutional: He is oriented to person, place, and time. He appears well-developed and well-nourished.  HENT:  Head: Normocephalic and atraumatic.  Eyes: Conjunctivae and EOM are normal. Pupils are equal, round, and reactive to light.  Neck: Normal range of motion. Neck supple.  Cardiovascular: Normal rate, regular rhythm and normal heart sounds.   Pulmonary/Chest: Effort normal. He has wheezes.  Abdominal:  Soft. Normal appearance and bowel sounds are normal. There is no tenderness. There is no rigidity, no rebound, no guarding, no tenderness at McBurney's point and negative Murphy's sign.  Musculoskeletal: Normal range of motion.  Neurological: He is alert and oriented to person, place, and time. He has normal strength.  Alert to person and place. Not time.  Able to answer questions, but requires frequent verbal stimulus to gain attention   Left sided weakness. Appears baseline from previous Oncology visit 10-18-15  Skin: Skin is warm and dry.  Psychiatric: He has a normal mood and affect. His behavior is normal. Thought content normal.  Nursing note and vitals reviewed.  ED Course  Procedures (including critical care time) Labs Review Labs Reviewed  COMPREHENSIVE METABOLIC PANEL - Abnormal; Notable for  the following:    Glucose, Bld 122 (*)    BUN 32 (*)    Total Protein 5.8 (*)    Albumin 2.9 (*)    AST 67 (*)    ALT 148 (*)    Alkaline Phosphatase 155 (*)    All other components within normal limits  CBC - Abnormal; Notable for the following:    RDW 20.4 (*)    Platelets 142 (*)    All other components within normal limits  URINALYSIS, ROUTINE W REFLEX MICROSCOPIC (NOT AT [facility]) - Abnormal; Notable for the following:    Color, Urine AMBER (*)    APPearance CLOUDY (*)    Hgb urine dipstick MODERATE (*)    Bilirubin Urine SMALL (*)    All other components within normal limits  URINE MICROSCOPIC-ADD ON - Abnormal; Notable for the following:    Squamous Epithelial / LPF 0-5 (*)    All other components within normal limits  CBG MONITORING, ED  I-STAT CG4 LACTIC ACID, ED   Imaging Review Ct Head Wo Contrast  11/06/2015  CLINICAL DATA:  Declining cognition. Occipital glioblastoma with radiation therapy. EXAM: CT HEAD WITHOUT CONTRAST TECHNIQUE: Contiguous axial images were obtained from the base of the skull through the vertex without intravenous contrast. COMPARISON:  10/08/2015  FINDINGS: Right occipital mass with extensive hypodensity in the white matter tracking in the right temporal, occipital, and parietal lobes and into the right frontal lobe. The mass itself probably involves part of the temporal lobe. Effacement of right-sided sulci; the mass effect from the process causes 1.5 cm of right to left midline shift (previously 1.3 cm) with effacement of the right lateral ventricle and trapping of the left lateral ventricle with associated dilation of the left temporal horn. There may be some slight uncal herniation on the right side. Right craniotomy noted. Abnormal hypodensity involving the splenium of the corpus callosum observed crossing the midline to the left side. Acute on chronic right sphenoid sinusitis. There is atherosclerotic calcification of the cavernous carotid arteries bilaterally. Frothy air fluid level in a posterior right ethmoid air cell. IMPRESSION: 1. The right occipital mass and extensive associated vasogenic edema tracking in the right hemisphere is now associated with 1.5 cm right to left midline shift, previously 1.3 cm. This causes effacement of the right lateral ventricle, trapping of the left lateral ventricle, and questionable uncal herniation. The mass also crosses the midline in the splenium of the corpus callosum. Electronically Signed   By: Van Clines M.D.   On: 11/22/2015 17:23   Dg Chest Portable 1 View  11/12/2015  CLINICAL DATA:  Shortness of breath, follow-up pneumonia EXAM: PORTABLE CHEST 1 VIEW COMPARISON:  10/06/2015 FINDINGS: Cardiomediastinal silhouette is stable. There is bilateral reticular interstitial prominence with slight worsening from prior exam. Mild perihilar increased bronchial markings. Findings highly suspicious for recurrent interstitial pneumonitis or mild interstitial edema. Clinical correlation is necessary. No segmental consolidation is noted. IMPRESSION: There is bilateral reticular interstitial prominence with slight  worsening from prior exam. Mild perihilar increased bronchial markings. Findings highly suspicious for recurrent interstitial pneumonitis or mild interstitial edema. Clinical correlation is necessary. No segmental consolidation is noted. Electronically Signed   By: Lahoma Crocker M.D.   On: 11/16/2015 15:41   I have personally reviewed and evaluated these images and lab results as part of my medical decision-making.   EKG Interpretation None      MDM  I have reviewed and evaluated the relevant laboratory values.I have reviewed and  evaluated the relevant imaging studies.I personally evaluated and interpreted the relevant EKG.I have reviewed the relevant previous healthcare records.I have reviewed EMS Documentation. I obtained HPI from historian. Patient discussed with supervising physician  ED Course:  Assessment: 43y M with hx Glioblastoma, Craniotomy presents due to generalized weakness x 1 week. Per nursing facility. Decrease in PO intake. Decrease in activity and mental status. Being seen by Dr. Burr Medico (Oncology) due to Glioblastoma and treated at [facility]. On exam, pt in NAD. VSS. Afebrile. Lungs show wheezing bilaterally. Heart RRR. Abdomen nontender/soft. Given 1L NS in ED. Initial labs unremarkable in CBC, CMP, UA. CXR shows increased bronchial markings with no segmental consolidation noted. Ordered CT head showed right occipital mass with 1.5cm left midline shift, which was worsening from previous imaging. Questionable uncal herniation. Given decadron in ED. Plan is to admit to medicine with neurosurgery to follow.     Disposition/Plan:  Admit Pt acknowledges and agrees with plan  Supervising Physician Gareth Morgan, MD   Final diagnoses:  Altered mental status, unspecified altered mental status type      Shary Decamp, PA-C 11/03/15 0013  Gareth Morgan, MD 11/03/15 [phone]

## 2015-11-02 NOTE — ED Notes (Signed)
Per EMS, staff from [facility] noticed a decline in pt's cognition over the past week. Pt responds to verbal stimulus, is able to follow commands. Pt is alert to person and place. Pt has hx of brain tumor. Pt is full code.

## 2015-11-02 NOTE — ED Notes (Signed)
Bed: [id number] Expected date:  Expected time:  Means of arrival:  Comments: Ems confusion

## 2015-11-02 NOTE — ED Notes (Signed)
Attempted report to 3W. RN unavailable.

## 2015-11-02 NOTE — ED Notes (Signed)
Notified Agricultural consultant of patient's wait time for a bed. Secretary called Bed Control and they stated the room is being cleaned and then the room will be ready for this patient.

## 2015-11-02 NOTE — H&P (Signed)
[facility] History and Physical  Raahil Ong CVE:[id number] DOB: 12-08-1955 DOA: 11/24/2015   PCP: Truitt Merle, MD  Specialists: Followed by Oncology at [facility], as well  Chief Complaint: Worsening mental status and progressive weakness  HPI: Jake [name] is a 60 y.o. 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 Patient responds when I call his name, but is unable to provide much history. His mother-in-law is at the bedside who provided some information. I also spoke to his wife over the phone. Patient is getting treatment for his brain tumor in the form of bevacizumab every 3 weeks, which was started on January 25. He is also on a clinical trial at [facility] with a vaccine, which started in December 2016. Patient was last hospitalized earlier this month for pneumonia. He was sent back to the skilled nursing facility in good condition. There has been a lot of back-and-forth regarding patient's CODE STATUS. The last note from that hospitalization was the patient was full code. Apparently over the last 2-3 days he has shown a decline in his mental status and weakness. He's had poor oral intake for the past many days. He also has a left-sided weakness due to his brain tumor which is also progressively gotten worse. History is limited.  In the emergency department, patient had a CT scan of his head. This showed a right occipital mass with vasogenic edema and worsening midline shift.   Home Medications: Prior to Admission medications   Medication Sig Start Date End Date Taking? Authorizing Provider  acetaminophen (TYLENOL) 325 MG tablet Take 2 tablets (650 mg total) by mouth every 6 (six) hours as needed for mild pain (or Fever >/= 101). 07/04/15  Yes Robbie Lis, MD  amitriptyline (ELAVIL) 25 MG tablet Take 25 mg by  mouth at bedtime.  06/30/15  Yes Historical Provider, MD  butalbital-acetaminophen-caffeine (FIORICET WITH CODEINE) 50-325-40-30 MG capsule Take 1 capsule by mouth every 4 (four) hours as needed for headache.   Yes Historical Provider, MD  chlorhexidine (PERIDEX) 0.12 % solution Rinse with 15 mls three daily for 30 seconds. Use after breakfast, lunch and at bedtime. Spit out excess. Do not swallow. 05/10/15  Yes Lenn Cal, DDS  cyclobenzaprine (FLEXERIL) 10 MG tablet Take 1 tablet (10 mg total) by mouth 3 (three) times daily as needed for muscle spasms. 09/22/15  Yes Velvet Bathe, MD  dexamethasone (DECADRON) 4 MG tablet Take 2 tablets (8 mg total) by mouth 4 (four) times daily. Patient taking differently: Take 4-8 mg by mouth 3 (three) times daily. Take 8 mg in the am and at noon, and take 11m in the evening 10/10/15  Yes PLavina Hamman MD  docusate sodium (COLACE) 100 MG capsule Take 100 mg by mouth daily.    Yes Historical Provider, MD  HYDROcodone-acetaminophen (NORCO/VICODIN) 5-325 MG tablet Take 1 tablet by mouth every 4 (four) hours as needed for moderate pain. Do not exceed 4gm of Tylenol in 24 hours 10/11/15  Yes MGildardo Cranker DO  LACTOBACILLUS PO Take 1 capsule by mouth daily.   Yes Historical Provider, MD  levothyroxine (SYNTHROID, LEVOTHROID) 25 MCG tablet Take 25 mcg by mouth daily before breakfast.   Yes Historical Provider, MD  LORazepam (ATIVAN) 0.5 MG tablet Take 0.5 mg by mouth at bedtime. At QHS and q8h PRN for anxiety & insomnia   Yes Historical  Provider, MD  NEXIUM 24HR 20 MG capsule Take 20 mg by mouth daily.  10/21/14  Yes Historical Provider, MD  Nutritional Supplements (BOOST PUDDING PO) Take 1 Container by mouth 3 (three) times daily with meals.   Yes Historical Provider, MD  Nutritional Supplements (NUTRITIONAL DRINK PO) Take 1 Can by mouth 2 (two) times daily.   Yes Historical Provider, MD  polyethylene glycol (MIRALAX / GLYCOLAX) packet Take 17 g by mouth daily.   Yes  Historical Provider, MD  polyvinyl alcohol (LIQUIFILM TEARS) 1.4 % ophthalmic solution Place 1 drop into both eyes 4 (four) times daily as needed for dry eyes.    Yes Historical Provider, MD  Protein (PROCEL PO) Take 2 scoop by mouth 2 (two) times daily. Reported on 10/13/2015 Pt takes on protein shakes   Yes Historical Provider, MD  senna (SENOKOT) 8.6 MG tablet Take 1 tablet by mouth daily as needed for constipation.    Yes Historical Provider, MD  verapamil (COVERA HS) 180 MG (CO) 24 hr tablet Take 1 tablet (180 mg total) by mouth at bedtime. 03/04/15  Yes Shawnee Knapp, MD  nystatin (MYCOSTATIN) 100000 UNIT/ML suspension Take 5 mLs (500,000 Units total) by mouth 4 (four) times daily. Patient not taking: Reported on 11/01/2015 07/07/15   Truitt Merle, MD    Allergies:  Allergies  Allergen Reactions  . Other Nausea And Vomiting    Steroids - cause cramping all through the body - Headaches    Past Medical History: Past Medical History  Diagnosis Date  . Thyroid disease   . Hypertension   . Allergy   . Arthritis   . Sleep apnea   . Kidney stone   . Glioblastoma multiforme of occipital lobe ([place])   . Nocturnal leg cramps 09/29/2014  . History of radiation therapy 08/03/14- 09/15/14    right occipito-parietal brain/46 Gy 23 fx, right brain boost/ 14 Gy in 7 fx    Past Surgical History  Procedure Laterality Date  . Knee arthroscopy w/ meniscal repair    . Treatment fistula anal    . Craniotomy N/A 07/07/2014    Procedure: CRANIOTOMY TUMOR EXCISION/CURVE;  Surgeon: Elaina Hoops, MD;  Location: [place] [facility];  Service: Neurosurgery;  Laterality: N/A;  . Brain surgery      Social History: Lives in a skilled nursing facility. No other history is forthcoming.   Family History:  Family History  Problem Relation Age of Onset  . Hypertension Mother   . Diabetes Father   . Glaucoma Father   . Diabetes Brother   . Diabetes Brother   . Colon cancer Neg Hx      Review of Systems - unable to  obtain from the patient due to his altered mental status  Physical Examination  Filed Vitals:   11/06/2015 1427 11/24/2015 1626 11/30/2015 1736  BP: 125/91 130/92 132/93  Pulse: 103 86 96  Temp: 99.1 F (37.3 C)    TempSrc: Rectal    Resp: '15 13 20  ' SpO2: 94% 94% 94%    BP 132/93 mmHg  Pulse 96  Temp(Src) 99.1 F (37.3 C) (Rectal)  Resp 20  SpO2 94%  General appearance: Response to oral command but doesn't answer any questions. Head: Normocephalic, without obvious abnormality, atraumatic Eyes: conjunctivae/corneas clear. PERRL, Throat: lips, mucosa, and tongue normal; teeth and gums normal Neck: Cushingoid features noted Resp: clear to auscultation bilaterally Cardio: regular rate and rhythm, S1, S2 normal, no murmur, click, rub or gallop GI: soft, non-tender;  bowel sounds normal; no masses,  no organomegaly Extremities: extremities normal, atraumatic, no cyanosis or edema Pulses: 2+ and symmetric Skin: Skin color, texture, turgor normal. No rashes or lesions Lymph nodes: Cervical, supraclavicular, and axillary nodes normal. Neurologic: He is noted to move his right upper and lower extremities. Not moving his left side.  Laboratory Data: Results for orders placed or performed during the hospital encounter of 11/01/2015 (from the past 48 hour(s))  CBG monitoring, ED     Status: None   Collection Time: 11/27/2015  2:30 PM  Result Value Ref Range   Glucose-Capillary 98 65 - 99 mg/dL  Urinalysis, Routine w reflex microscopic-may I&O cath if menses (not at [facility])     Status: Abnormal   Collection Time: 11/19/2015  2:42 PM  Result Value Ref Range   Color, Urine AMBER (A) YELLOW    Comment: BIOCHEMICALS MAY BE AFFECTED BY COLOR   APPearance CLOUDY (A) CLEAR   Specific Gravity, Urine 1.020 1.005 - 1.030   pH 6.0 5.0 - 8.0   Glucose, UA NEGATIVE NEGATIVE mg/dL   Hgb urine dipstick MODERATE (A) NEGATIVE   Bilirubin Urine SMALL (A) NEGATIVE   Ketones, ur NEGATIVE NEGATIVE mg/dL    Protein, ur NEGATIVE NEGATIVE mg/dL   Nitrite NEGATIVE NEGATIVE   Leukocytes, UA NEGATIVE NEGATIVE  Urine microscopic-add on     Status: Abnormal   Collection Time: 11/24/2015  2:42 PM  Result Value Ref Range   Squamous Epithelial / LPF 0-5 (A) NONE SEEN   WBC, UA 0-5 0 - 5 WBC/hpf   RBC / HPF 0-5 0 - 5 RBC/hpf   Bacteria, UA NONE SEEN NONE SEEN  Comprehensive metabolic panel     Status: Abnormal   Collection Time: 11/06/2015  3:13 PM  Result Value Ref Range   Sodium 143 135 - 145 mmol/L   Potassium 3.7 3.5 - 5.1 mmol/L   Chloride 110 101 - 111 mmol/L   CO2 22 22 - 32 mmol/L   Glucose, Bld 122 (H) 65 - 99 mg/dL   BUN 32 (H) 6 - 20 mg/dL   Creatinine, Ser 0.63 0.61 - 1.24 mg/dL   Calcium 9.1 8.9 - 10.3 mg/dL   Total Protein 5.8 (L) 6.5 - 8.1 g/dL   Albumin 2.9 (L) 3.5 - 5.0 g/dL   AST 67 (H) 15 - 41 U/L   ALT 148 (H) 17 - 63 U/L   Alkaline Phosphatase 155 (H) 38 - 126 U/L   Total Bilirubin 1.0 0.3 - 1.2 mg/dL   GFR calc non Af Amer >60 >60 mL/min   GFR calc Af Amer >60 >60 mL/min    Comment: (NOTE) The eGFR has been calculated using the CKD EPI equation. This calculation has not been validated in all clinical situations. eGFR's persistently <60 mL/min signify possible Chronic Kidney Disease.    Anion gap 11 5 - 15  CBC     Status: Abnormal   Collection Time: 11/27/2015  3:13 PM  Result Value Ref Range   WBC 8.8 4.0 - 10.5 K/uL   RBC 4.63 4.22 - 5.81 MIL/uL   Hemoglobin 13.6 13.0 - 17.0 g/dL   HCT 43.8 39.0 - 52.0 %   MCV 94.6 78.0 - 100.0 fL   MCH 29.4 26.0 - 34.0 pg   MCHC 31.1 30.0 - 36.0 g/dL   RDW 20.4 (H) 11.5 - 15.5 %   Platelets 142 (L) 150 - 400 K/uL  I-Stat CG4 Lactic Acid, ED     Status:  None   Collection Time: 11/01/2015  4:31 PM  Result Value Ref Range   Lactic Acid, Venous 1.96 0.5 - 2.0 mmol/L    Radiology Reports: Ct Head Wo Contrast  11/01/2015  CLINICAL DATA:  Declining cognition. Occipital glioblastoma with radiation therapy. EXAM: CT HEAD WITHOUT  CONTRAST TECHNIQUE: Contiguous axial images were obtained from the base of the skull through the vertex without intravenous contrast. COMPARISON:  10/08/2015 FINDINGS: Right occipital mass with extensive hypodensity in the white matter tracking in the right temporal, occipital, and parietal lobes and into the right frontal lobe. The mass itself probably involves part of the temporal lobe. Effacement of right-sided sulci; the mass effect from the process causes 1.5 cm of right to left midline shift (previously 1.3 cm) with effacement of the right lateral ventricle and trapping of the left lateral ventricle with associated dilation of the left temporal horn. There may be some slight uncal herniation on the right side. Right craniotomy noted. Abnormal hypodensity involving the splenium of the corpus callosum observed crossing the midline to the left side. Acute on chronic right sphenoid sinusitis. There is atherosclerotic calcification of the cavernous carotid arteries bilaterally. Frothy air fluid level in a posterior right ethmoid air cell. IMPRESSION: 1. The right occipital mass and extensive associated vasogenic edema tracking in the right hemisphere is now associated with 1.5 cm right to left midline shift, previously 1.3 cm. This causes effacement of the right lateral ventricle, trapping of the left lateral ventricle, and questionable uncal herniation. The mass also crosses the midline in the splenium of the corpus callosum. Electronically Signed   By: Van Clines M.D.   On: 11/01/2015 17:23   Dg Chest Portable 1 View  11/29/2015  CLINICAL DATA:  Shortness of breath, follow-up pneumonia EXAM: PORTABLE CHEST 1 VIEW COMPARISON:  10/06/2015 FINDINGS: Cardiomediastinal silhouette is stable. There is bilateral reticular interstitial prominence with slight worsening from prior exam. Mild perihilar increased bronchial markings. Findings highly suspicious for recurrent interstitial pneumonitis or mild  interstitial edema. Clinical correlation is necessary. No segmental consolidation is noted. IMPRESSION: There is bilateral reticular interstitial prominence with slight worsening from prior exam. Mild perihilar increased bronchial markings. Findings highly suspicious for recurrent interstitial pneumonitis or mild interstitial edema. Clinical correlation is necessary. No segmental consolidation is noted. Electronically Signed   By: Lahoma Crocker M.D.   On: 11/01/2015 15:41    My interpretation of Electrocardiogram: sinus tachycardia at 107 bpm. Normal axis. Intervals are normal. No concerning ST or T-wave changes.  Problem List  Active Problems:   Hypothyroidism   GBM (glioblastoma multiforme) (HCC)   Acute encephalopathy   Left-sided weakness   Physical deconditioning   Assessment: This is an unfortunate 60 year old white male with the history of glioblastoma multiforme with a right occipital mass who lives in a skilled nursing facility and comes in with the worsening mental status and progressively worsening generalized weakness as well as worsening left-sided weakness. His CT scan shows worsening midline shift. His symptoms could be explained by this finding.  Plan: #1 history of for glioblastoma multiforme with left hemiparesis and now with worsening edema and midline shift: This is the most likely explanation for the patient's presentation. He will be given high-dose dexamethasone. Apparently, his dose of oral dexamethasone was reduced recently as it was apparently causing side effects in the form of muscle pain, etc. However, at this time, we're unable to avoid an increase in the dose. He is maintaining his airway at this time. He'll be  kept nothing by mouth for now. Dr. Burr Medico is his oncologist.  #2 Abnormal chest x-ray. Patient was hospitalized in February for healthcare associated pneumonia. Interstitial prominence is noted on chest x-ray at some worsening. There is a possibility he could be  aspirating. He has a low-grade fever. Vancomycin and Zosyn could be considered.  #3 history of hypothyroidism: Resume his Levothyroxine when he is able to take orally. TSH was last checked in January and was 0.19. We will repeat. We'll also check a free T4.   #4 history of essential hypertension: Monitor blood pressures closely.  #5 Transaminitis: This was noted previously as well. Etiology is unclear.   DVT Prophylaxis: SCDs Code Status: It appears that his family is going back and forth with his CODE STATUS. I spoke to [name] over the phone. She tells me that patient has told her that he is not the "ready to die" at this point. Apparently he told her this recently. In view of this, she would like for him to remain FULL CODE. She wants to discuss this further with the patient's oncologist. Family Communication: Discussed with the patient's wife over the phone  Disposition Plan: Admit to MedSurg   Further management decisions will depend on results of further testing and patient's response to treatment.   [facility]  [facility] Pager [phone]  If 7PM-7AM, please contact night-coverage [email or web address] Password [facility]  11/09/2015, 6:43 PM

## 2015-11-02 NOTE — Progress Notes (Signed)
CSW attempted to speak with patient. However, patient was asleep. CSW will attempt to speak with patient later.  Willette Brace [id number] ED CSW 11/26/2015 7:41 PM

## 2015-11-03 DIAGNOSIS — C719 Malignant neoplasm of brain, unspecified: Principal | ICD-10-CM

## 2015-11-03 LAB — COMPREHENSIVE METABOLIC PANEL
ALBUMIN: 2.6 g/dL — AB (ref 3.5–5.0)
ALK PHOS: 139 U/L — AB (ref 38–126)
ALT: 131 U/L — ABNORMAL HIGH (ref 17–63)
ANION GAP: 9 (ref 5–15)
AST: 56 U/L — AB (ref 15–41)
BUN: 31 mg/dL — AB (ref 6–20)
CALCIUM: 8.9 mg/dL (ref 8.9–10.3)
CO2: 23 mmol/L (ref 22–32)
Chloride: 114 mmol/L — ABNORMAL HIGH (ref 101–111)
Creatinine, Ser: 0.53 mg/dL — ABNORMAL LOW (ref 0.61–1.24)
GFR calc Af Amer: >60 mL/min (ref >60)
GFR calc non Af Amer: >60 mL/min (ref >60)
GLUCOSE: 134 mg/dL — AB (ref 65–99)
Potassium: 3.5 mmol/L (ref 3.5–5.1)
SODIUM: 146 mmol/L — AB (ref 135–145)
Total Bilirubin: 0.7 mg/dL (ref 0.3–1.2)
Total Protein: 5.3 g/dL — ABNORMAL LOW (ref 6.5–8.1)

## 2015-11-03 LAB — CBC
HEMATOCRIT: 40 % (ref 39.0–52.0)
HEMOGLOBIN: 12.4 g/dL — AB (ref 13.0–17.0)
MCH: 29.5 pg (ref 26.0–34.0)
MCHC: 31 g/dL (ref 30.0–36.0)
MCV: 95.2 fL (ref 78.0–100.0)
Platelets: 137 10*3/uL — ABNORMAL LOW (ref 150–400)
RBC: 4.2 MIL/uL — ABNORMAL LOW (ref 4.22–5.81)
RDW: 20.3 % — ABNORMAL HIGH (ref 11.5–15.5)
WBC: 6.9 10*3/uL (ref 4.0–10.5)

## 2015-11-03 LAB — T4, FREE: Free T4: 0.63 ng/dL (ref 0.61–1.12)

## 2015-11-03 LAB — TSH: TSH: 0.821 u[IU]/mL (ref 0.350–4.500)

## 2015-11-03 NOTE — Progress Notes (Signed)
Staffed with Dr. Coralyn Pear and Dr. Irene Limbo with oncology. Dr. Irene Limbo spoke with wife and wife invested in primary oncologist recommendation, Dr. Annamaria Boots. Recommended that we see when she returns on Monday. PMT has seen pt in the past. Wife has been struggling with code status. When seen b Dr, Domingo Cocking in September, plan was for DNR and DC to [facility]. Pmt will FU Monday after pt and wife meet with Dr. Vern Claude, ANP

## 2015-11-03 NOTE — Progress Notes (Signed)
Nutrition Follow-up  DOCUMENTATION CODES:   Not applicable, Severe malnutrition in context of acute illness/injury  INTERVENTION:  -RD to continue to monitor for needs, goals of care  NUTRITION DIAGNOSIS:   Malnutrition related to acute illness as evidenced by energy intake < 75% for > 7 days.  GOAL:   Patient will meet greater than or equal to 90% of their needs  MONITOR:   Labs, Skin, I & O's, Diet advancement  REASON FOR ASSESSMENT:   Malnutrition Screening Tool    ASSESSMENT:   Jake [name] is a 60 y.o. 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 Patient responds when I call his name, but is unable to provide much history.  Attempted to speak with Jake [name] but was unable to wake him. Per chart review, he hasn't eaten/drank anything in 1 week. His weight is also down from 211-189 for a 22#/10.5% severe wt loss over 1 month.  Per Palliative/Oncology notes, pt is approaching terminal stage of disease. He is s/p subtotal resection and chemoradiation in Jan 2016. He also underwent a vaccine trial at [facility] and chemo.   Dr. Burr Medico was him 2/15, was going to try Avastin and dexamethasone and consider hospice if disease progressed.  Dr. Irene [name] recommended transition to comfort care but pt's wife wants to wait for Dr. Burr Medico to return Monday.  Follow for GOC.  Labs and Medications reviewed Diet Order:  Diet NPO time specified  Skin:  Reviewed, no issues  Last BM:  3/2  Height:   Ht Readings from Last 1 Encounters:  11/19/2015 5\' 1"  (1.549 m)    Weight:   Wt Readings from Last 1 Encounters:  11/06/2015 189 lb 6 oz (85.9 kg)    Ideal Body Weight:  50.9 kg  BMI:  Body mass index is 35.8 kg/(m^2).  Estimated Nutritional Needs:   Kcal:  [id number]  Protein:  100-130 grams  Fluid:  >/= 1.8L  EDUCATION NEEDS:   No  education needs identified at this time  Satira Anis. Bond Grieshop, MS, RD LDN After Hours/Weekend Pager [phone]

## 2015-11-03 NOTE — Progress Notes (Signed)
Pharmacy Antibiotic Note  Airen Motte is a 60 y.o. male admitted on 11/08/2015 with pneumonia.  Pharmacy has been consulted for Vancomycin and Zosyn dosing.  Plan: Vancomycin 1gm IV every 8 hours.  Goal trough 15-20 mcg/mL. Zosyn 3.375g IV q8h (4 hour infusion).  Height: 5\' 1"  (154.9 cm) Weight: 189 lb 6 oz (85.9 kg) IBW/kg (Calculated) : 52.3  Temp (24hrs), Avg:99.1 F (37.3 C), Min:99.1 F (37.3 C), Max:99.1 F (37.3 C)   Recent Labs Lab 11/12/2015 1513 11/01/2015 1631 11/12/2015 2026 11/03/15 0353  WBC 8.8  --   --  6.9  CREATININE 0.63  --   --  0.53*  LATICACIDVEN  --  1.96 1.44  --     Estimated Creatinine Clearance: 91.3 mL/min (by C-G formula based on Cr of 0.53).    Allergies  Allergen Reactions  . Other Nausea And Vomiting    Steroids - cause cramping all through the body - Headaches    Antimicrobials this admission: Vancomycin 3/2 >> Zosyn 3/2 >>  Thank you for allowing pharmacy to be a part of this patient's care.  Nani Skillern Crowford 11/03/2015 7:18 AM

## 2015-11-03 NOTE — Progress Notes (Signed)
SLP Cancellation Note  Patient Details Name: Tait Legarda MRN: [id number] DOB: [birth year]   Cancelled treatment:       Reason Eval/Treat Not Completed: Patient's level of consciousness. Spoke with patient's RN Adline Peals), who stated that patient has not been awake/alert since he arrived to [facility]. Currently, patient is sleeping and not arousable. RN also stated that per family, patient has not eaten anything in the week prior to admission. Will check back at next date to assess readiness for BSE.   Sonia Baller, MA, CCC-SLP 11/03/2015 9:04 AM

## 2015-11-03 NOTE — Progress Notes (Signed)
CSW received referral that pt admitted from [facility].   Per chart review, pt primary oncologist, Dr. Annamaria Boots plans to meet with pt wife on Monday.   CSW to follow up with recommendations to best assist with pt disposition plans.   Alison Murray, MSW, [facility] [employer] [phone]

## 2015-11-03 NOTE — Progress Notes (Signed)
Pt transported by NT and accompanied by Pt mother in law. Pt is lethargic and confused. Unable to answer any questions. Responds to movement , touch and pain. Vital signs are within normal limits.

## 2015-11-03 NOTE — Progress Notes (Signed)
[facility] PROGRESS NOTE  Jake [name] [id number] DOB: Jan 03, 1956 DOA: 11/06/2015 PCP: Truitt Merle, MD  Assessment/Plan: 1. Glioblastoma multiforme -Jake [name] having a history of glioblastoma multiforme diagnosed in 2015, status post subtotal right parietal tumor resection undergoing concurrent radiation with Temodar. Treatment stopped in 2016 due to evidence of disease progression. He was then treated with Bevacizumab from 01/06/2015 through 05/26/2015, discontinued due to disease progression. -He was last seen by medical oncology on 10/18/2015. Oncology notes stating he has had significant neurologic deterioration. Options had been discussed with family members during that visit that included continuing Avastin therapy versus transitioning to hospice. It appears family members expressed wishes to continue with Avastin therapy for now. -He presents with a significant deterioration in his mental status as a CT scan of brain overnight showed right occipital mass and extensive associated vasogenic edema tracking in the right hemisphere associate with a 1.5 cm right to left midline shift (previously 1.3 cm). -Prognosis is poor, having disease progression despite multiple treatment regimens. -On exam he is minimally responsive. -I think that palliative care/hospice would be appropriate. Will consult medical oncology and palliative care -Meanwhile will continue IV Decadron 10 mg every 6 hours  2.  Suspected healthcare associated pneumonia versus aspiration pneumonia -Mental status changes secondary to brain tumor make an argument for aspiration pneumonia. -He was started on empiric broad-spectrum IV antimicrobial therapy with Zosyn and vancomycin.  3. Hypertension. - Blood pressure stable  4.  Goals of care -Patient approaching terminal stage of disease. Will consult palliative care to facilitate goals of care discussion with family members today.  Code Status: Full code Family  Communication:  Disposition Plan: Pending palliative care and medical oncology consults   Consultants:  Medical oncology  Palliative care   Antibiotics:  IV Zosyn  IV vancomycin  HPI/Subjective: Jake [name] is an unfortunate gentleman with a past medical history of glioblastoma multiform, status post subtotal right parietal tumor resection in 2015 with concurrent chemoradiation therapy, previous imaging studies showing evidence of disease progression, last saw his medical oncologist Dr. Burr Medico on 10/18/2015 at which time his dexamethasone was decreased to 8 mg by mouth 3 times a day. He has had intermittent confusion/mental status changes however had been oriented to the balloon place. I baseline. His poor functional status, presently residing at a skilled nursing facility. Physical therapy was recently discontinued given inability to participate. He was admitted to the medicine service on 11/10/2015 presenting with functional decline and worsening mental status. He had a CT scan of brain that showed right occipital mass and extensive associated vasogenic edema tracking in the right hemisphere now associate with a 1.57 m right to left midline shift, representing disease progression from previous study.  Objective: Filed Vitals:   11/03/15 0521 11/03/15 0747  BP: 119/89   Pulse: 63   Temp:  98.2 F (36.8 C)  Resp: 18    No intake or output data in the 24 hours ending 11/03/15 0857 Filed Weights   11/11/2015 2305  Weight: 85.9 kg (189 lb 6 oz)    Exam:   General:  He is minimally responsive, difficult to arouse. Nonverbal  Cardiovascular: Regular rate rhythm normal S1-S2  Respiratory: Normal respiratory effort off of supplemental oxygen  Abdomen: Obese, soft nontender nondistended  Musculoskeletal: No edema  Neurological: Difficult to perform neurologic examination given encephalopathy. He is obtunded, nonverbal. He was able to squeeze my right hand, left hand appears  flaccid. Did not participate in the rest my neurologic exam.  Data  Reviewed: Basic Metabolic Panel:  Recent Labs Lab 11/14/2015 1513 11/03/15 0353  NA 143 146*  K 3.7 3.5  CL 110 114*  CO2 22 23  GLUCOSE 122* 134*  BUN 32* 31*  CREATININE 0.63 0.53*  CALCIUM 9.1 8.9   Liver Function Tests:  Recent Labs Lab 11/27/2015 1513 11/03/15 0353  AST 67* 56*  ALT 148* 131*  ALKPHOS 155* 139*  BILITOT 1.0 0.7  PROT 5.8* 5.3*  ALBUMIN 2.9* 2.6*   No results for input(s): LIPASE, AMYLASE in the last 168 hours. No results for input(s): AMMONIA in the last 168 hours. CBC:  Recent Labs Lab 11/29/2015 1513 11/03/15 0353  WBC 8.8 6.9  HGB 13.6 12.4*  HCT 43.8 40.0  MCV 94.6 95.2  PLT 142* 137*   Cardiac Enzymes: No results for input(s): CKTOTAL, CKMB, CKMBINDEX, TROPONINI in the last 168 hours. BNP (last 3 results)  Recent Labs  10/06/15 2300  BNP 96.5    ProBNP (last 3 results) No results for input(s): PROBNP in the last 8760 hours.  CBG:  Recent Labs Lab 11/19/2015 1430  GLUCAP 98    No results found for this or any previous visit (from the past 240 hour(s)).   Studies: Ct Head Wo Contrast  11/18/2015  CLINICAL DATA:  Declining cognition. Occipital glioblastoma with radiation therapy. EXAM: CT HEAD WITHOUT CONTRAST TECHNIQUE: Contiguous axial images were obtained from the base of the skull through the vertex without intravenous contrast. COMPARISON:  10/08/2015 FINDINGS: Right occipital mass with extensive hypodensity in the white matter tracking in the right temporal, occipital, and parietal lobes and into the right frontal lobe. The mass itself probably involves part of the temporal lobe. Effacement of right-sided sulci; the mass effect from the process causes 1.5 cm of right to left midline shift (previously 1.3 cm) with effacement of the right lateral ventricle and trapping of the left lateral ventricle with associated dilation of the left temporal horn. There may be  some slight uncal herniation on the right side. Right craniotomy noted. Abnormal hypodensity involving the splenium of the corpus callosum observed crossing the midline to the left side. Acute on chronic right sphenoid sinusitis. There is atherosclerotic calcification of the cavernous carotid arteries bilaterally. Frothy air fluid level in a posterior right ethmoid air cell. IMPRESSION: 1. The right occipital mass and extensive associated vasogenic edema tracking in the right hemisphere is now associated with 1.5 cm right to left midline shift, previously 1.3 cm. This causes effacement of the right lateral ventricle, trapping of the left lateral ventricle, and questionable uncal herniation. The mass also crosses the midline in the splenium of the corpus callosum. Electronically Signed   By: Van Clines M.D.   On: 11/29/2015 17:23   Dg Chest Portable 1 View  11/25/2015  CLINICAL DATA:  Shortness of breath, follow-up pneumonia EXAM: PORTABLE CHEST 1 VIEW COMPARISON:  10/06/2015 FINDINGS: Cardiomediastinal silhouette is stable. There is bilateral reticular interstitial prominence with slight worsening from prior exam. Mild perihilar increased bronchial markings. Findings highly suspicious for recurrent interstitial pneumonitis or mild interstitial edema. Clinical correlation is necessary. No segmental consolidation is noted. IMPRESSION: There is bilateral reticular interstitial prominence with slight worsening from prior exam. Mild perihilar increased bronchial markings. Findings highly suspicious for recurrent interstitial pneumonitis or mild interstitial edema. Clinical correlation is necessary. No segmental consolidation is noted. Electronically Signed   By: Lahoma Crocker M.D.   On: 11/21/2015 15:41    Scheduled Meds: . dexamethasone  10 mg Intravenous 4  times per day  . piperacillin-tazobactam (ZOSYN)  IV  3.375 g Intravenous 3 times per day  . vancomycin  1,000 mg Intravenous 3 times per day    Continuous Infusions: . sodium chloride 75 mL/hr at 11/18/2015 2320    Active Problems:   Hypothyroidism   GBM (glioblastoma multiforme) (HCC)   Acute encephalopathy   Left-sided weakness   Physical deconditioning   Aspiration pneumonia ([place])    Time spent: 35 min    Kelvin Cellar  [facility] Pager [phone]. If 7PM-7AM, please contact night-coverage at [email or web address], password [facility] 11/03/2015, 8:57 AM  LOS: 1 day

## 2015-11-03 NOTE — Progress Notes (Signed)
Marland Kitchen    HEMATOLOGY/ONCOLOGY CONSULTATION NOTE  Date of Service: 11/03/2015  Patient Care Team: Truitt Merle, MD as PCP - General (Hematology) Kary Kos, MD as Consulting Physician (Neurosurgery) Eppie Gibson, MD as Attending Physician (Radiation Oncology) Truitt Merle, MD as Consulting Physician (Hematology) Meta Hatchet, LCSW as Social Worker Blanchie Serve, MD as Consulting Physician (Internal Medicine)  CHIEF COMPLAINTS/PURPOSE OF CONSULTATION:  Progressive GBM - to help define prognosis  HISTORY OF PRESENTING ILLNESS:   Jake [name] is a 60 y.o. male who has been has been in the care of Dr. Burr Medico for the management of glioblastoma multiforme since his initial diagnosis in November 2015. He has been treated with multiple lines of therapy as summarized below.  .   Glioblastoma of occipital lobe ([place]) (Resolved)   07/02/2014 Imaging Brain MRI: Two adjacent enhancing mass lesions in the right parietal lobe with central necrosis and extensive surrounding edema and mass effect.  7 mm midline shift to the left.  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   CT CAP(-)   07/07/2014 Surgery Subtotal right parietal tumors resection    08/03/2014 Concurrent Chemotherapy Concurrent radiation with Temodar 75 mg/m daily, completed on 09/15/2014.    09/29/2014 - 12/28/2014 Chemotherapy Adjuvant Temodar, 150-200mg /m2 for 5 days, every 28 day cycle, s/p 3 cycles, stopped due to disease progression.   10/14/2014 Imaging Two discrete foci of enhancement in the RIGHT occipital lobe surround the larger resection cavity, and are suspected to represent foci of residual tumor/GBM displaying interval growt   12/13/2014 Imaging Increased size/ enhancement of 2 lesions adjacent to the right occipital resection cavity, concerning for growth of residual tumor.   01/06/2015 - 05/26/2015 Chemotherapy Bevacizumab 10mg /kg every 2 weeks, stopped due to disease progression    04/18/2015 Progression Brain MRI showed  progressed masslike signal abnormality in the right occipital lobe, most compatible with interval to progression of disease.   04/28/2015 - 05/26/2015 Chemotherapy Irinotecan 1 25 mg/m, every 2 weeks, was added on to Avastin due to disease progression. Stopped due to disease progression.    06/02/2015 Progression Brain MRI showed further increased masslike T2 hyperintensity and irregular enhancement in the right occipital and posterior right temporal lobes since August again suggesting true progression of disease. Intracranial mass effect is stable.   07/03/2015 - 07/04/2015 Hospital Admission He was admitted for worsening headaches and fever. ID workup was negative. His dexamethasone was increased, and his headaches are much improved. He was discharged home Today.   08/22/2015 Procedure Admission to [name] for AIRO guided right tempora sterostatic brain biopsy with placement of two therapeutic catheters. The frozen pathology showed recurrent malignant glioma. Infusion of D2C7 vaccine was started on 08/22/15. Catheters removed 12/26   09/19/2015 - 09/22/2015 Hospital Admission Intractable headache with worsening brain mass with midline shift. Discharged with steroids to rehab facility.    09/20/2015 Imaging Significant worsening of mass like T2 hyperintensity in the right hemisphere, with new extensive involvement of the splenium of the corpus callosum which tracks across midline to the left. Subsequent increased intracranial mass effect with leftward mid   09/27/2015 -  Chemotherapy Avastin 7.5mg /m2 every 3 weeks    10/06/2015 - 10/10/2015 Hospital Admission Pt was admitted for acute encephalopathy and healthcare associated pneumonia. Repeat CT head showed worsening edema around tumor, and worsening midline shift. He was treated with antibiotics and discharged back to SNF    GBM (glioblastoma multiforme) ([place])   07/22/2014 Initial Diagnosis GBM (glioblastoma multiforme)   He  has had progressive decline over the  last few months including hospitalizations for altered mental status on 2 occasions in January and February 2017 and for a 3rd time now. He has currently been residing in a skilled nursing facility . He has significant baseline left-sided weakness related to his GBM.   Patient is currently obtunded and is unable to provide any information. As per his admission note he presented with worsening mental status and progressive weakness. As per available information he has had pretty significant decline in his oral intake with worsening left-sided weakness. His CT scan in the emergency room showed his right occipital mass with significant vasogenic edema and worsening midline shift.   MEDICAL HISTORY:  Past Medical History  Diagnosis Date  . Thyroid disease   . Hypertension   . Allergy   . Arthritis   . Sleep apnea   . Kidney stone   . Glioblastoma multiforme of occipital lobe ([place])   . Nocturnal leg cramps 09/29/2014  . History of radiation therapy 08/03/14- 09/15/14    right occipito-parietal brain/46 Gy 23 fx, right brain boost/ 14 Gy in 7 fx    SURGICAL HISTORY: Past Surgical History  Procedure Laterality Date  . Knee arthroscopy w/ meniscal repair    . Treatment fistula anal    . Craniotomy N/A 07/07/2014    Procedure: CRANIOTOMY TUMOR EXCISION/CURVE;  Surgeon: Elaina Hoops, MD;  Location: [facility];  Service: Neurosurgery;  Laterality: N/A;  . Brain surgery      SOCIAL HISTORY: Social History   Social History  . Marital Status: Married    Spouse Name: N/A  . Number of Children: 5  . Years of Education: college   Occupational History  . Not on file.   Social History Main Topics  . Smoking status: Current Every Day Smoker -- 0.50 packs/day for 45 years    Types: Cigarettes  . Smokeless tobacco: Never Used  . Alcohol Use: 0.0 oz/week    0 Standard drinks or equivalent per week     Comment: 2-3 beers per/day   . Drug Use: No  . Sexual Activity: Not Currently   Other  Topics Concern  . Not on file   Social History Narrative   Patient is right handed.   Patient drinks 2 cups daily       FAMILY HISTORY: Family History  Problem Relation Age of Onset  . Hypertension Mother   . Diabetes Father   . Glaucoma Father   . Diabetes Brother   . Diabetes Brother   . Colon cancer Neg Hx     ALLERGIES:  is allergic to other.  MEDICATIONS:  Current Facility-Administered Medications  Medication Dose Route Frequency Provider Last Rate Last Dose  . 0.9 %  sodium chloride infusion   Intravenous Continuous Bonnielee Haff, MD 75 mL/hr at 11/06/2015 2320    . acetaminophen (TYLENOL) tablet 650 mg  650 mg Oral Q6H PRN Bonnielee Haff, MD       Or  . acetaminophen (TYLENOL) suppository 650 mg  650 mg Rectal Q6H PRN Bonnielee Haff, MD      . albuterol (PROVENTIL) (2.5 MG/3ML) 0.083% nebulizer solution 2.5 mg  2.5 mg Nebulization Q2H PRN Bonnielee Haff, MD      . dexamethasone (DECADRON) injection 10 mg  10 mg Intravenous 4 times per day Bonnielee Haff, MD   10 mg at 11/03/15 1247  . morphine 2 MG/ML injection 2 mg  2 mg Intravenous Q4H PRN Bonnielee Haff, MD  2 mg at 11/03/15 0548  . ondansetron (ZOFRAN) tablet 4 mg  4 mg Oral Q6H PRN Bonnielee Haff, MD       Or  . ondansetron (ZOFRAN) injection 4 mg  4 mg Intravenous Q6H PRN Bonnielee Haff, MD      . piperacillin-tazobactam (ZOSYN) IVPB 3.375 g  3.375 g Intravenous 3 times per day Bonnielee Haff, MD   3.375 g at 11/03/15 0549  . vancomycin (VANCOCIN) IVPB 1000 mg/200 mL premix  1,000 mg Intravenous 3 times per day Bonnielee Haff, MD   1,000 mg at 11/03/15 0549    REVIEW OF SYSTEMS:    10 Point review of Systems was done is negative except as noted above.  PHYSICAL EXAMINATION: ECOG PERFORMANCE STATUS: 4 - Bedbound  . Filed Vitals:   11/03/15 0521 11/03/15 0747  BP: 119/89   Pulse: 63   Temp:  98.2 F (36.8 C)  Resp: 18    Filed Weights   11/15/2015 2305  Weight: 189 lb 6 oz (85.9 kg)   .Body mass  index is 35.8 kg/(m^2).  GENER Obtunded with some spontaneous and no obvious purposeful movements or verbalization. SKIN: Significant bruising noted on bilateral upper extremities  OROPHARYNX unable to examine:   LUNGS:  coarse breath sounds, no rhonchi HEART: regular rate & rhythm ABDOMEN: abdomen soft, non-tender, normoactive bowel sounds  Musculoskeletal: no cyanosis of digits and no clubbing  PSYCH: alert & oriented x 3 with fluent speech NEURO-obtunded, appears to have significant left-sided weakness though difficult to evaluate clearly.  LABORATORY DATA:  I have reviewed the data as listed  . CBC Latest Ref Rng 11/03/2015 11/01/2015 10/18/2015  WBC 4.0 - 10.5 K/uL 6.9 8.8 11.3(H)  Hemoglobin 13.0 - 17.0 g/dL 12.4(L) 13.6 13.6  Hematocrit 39.0 - 52.0 % 40.0 43.8 41.8  Platelets 150 - 400 K/uL 137(L) 142(L) 176    . CMP Latest Ref Rng 11/03/2015 11/15/2015 10/18/2015  Glucose 65 - 99 mg/dL 134(H) 122(H) 108  BUN 6 - 20 mg/dL 31(H) 32(H) 28.7(H)  Creatinine 0.61 - 1.24 mg/dL 0.53(L) 0.63 0.6(L)  Sodium 135 - 145 mmol/L 146(H) 143 137  Potassium 3.5 - 5.1 mmol/L 3.5 3.7 3.9  Chloride 101 - 111 mmol/L 114(H) 110 -  CO2 22 - 32 mmol/L 23 22 20(L)  Calcium 8.9 - 10.3 mg/dL 8.9 9.1 9.5  Total Protein 6.5 - 8.1 g/dL 5.3(L) 5.8(L) 5.9(L)  Total Bilirubin 0.3 - 1.2 mg/dL 0.7 1.0 0.62  Alkaline Phos 38 - 126 U/L 139(H) 155(H) 144  AST 15 - 41 U/L 56(H) 67(H) 76(H)  ALT 17 - 63 U/L 131(H) 148(H) 161(H)    RADIOGRAPHIC STUDIES:  I have personally reviewed the radiological images as listed and agreed with the findings in the report. Ct Head Wo Contrast  11/23/2015  CLINICAL DATA:  Declining cognition. Occipital glioblastoma with radiation therapy. EXAM: CT HEAD WITHOUT CONTRAST TECHNIQUE: Contiguous axial images were obtained from the base of the skull through the vertex without intravenous contrast. COMPARISON:  10/08/2015 FINDINGS: Right occipital mass with extensive hypodensity in the  white matter tracking in the right temporal, occipital, and parietal lobes and into the right frontal lobe. The mass itself probably involves part of the temporal lobe. Effacement of right-sided sulci; the mass effect from the process causes 1.5 cm of right to left midline shift (previously 1.3 cm) with effacement of the right lateral ventricle and trapping of the left lateral ventricle with associated dilation of the left temporal horn. There may be  some slight uncal herniation on the right side. Right craniotomy noted. Abnormal hypodensity involving the splenium of the corpus callosum observed crossing the midline to the left side. Acute on chronic right sphenoid sinusitis. There is atherosclerotic calcification of the cavernous carotid arteries bilaterally. Frothy air fluid level in a posterior right ethmoid air cell. IMPRESSION: 1. The right occipital mass and extensive associated vasogenic edema tracking in the right hemisphere is now associated with 1.5 cm right to left midline shift, previously 1.3 cm. This causes effacement of the right lateral ventricle, trapping of the left lateral ventricle, and questionable uncal herniation. The mass also crosses the midline in the splenium of the corpus callosum. Electronically Signed   By: Van Clines M.D.   On: 11/29/2015 17:23   Ct Head Wo Contrast  10/08/2015  CLINICAL DATA:  Brain tumor.  Left-sided weakness EXAM: CT HEAD WITHOUT CONTRAST TECHNIQUE: Contiguous axial images were obtained from the base of the skull through the vertex without intravenous contrast. COMPARISON:  CT 09/19/2015 FINDINGS: Infiltrating low-density mass is seen throughout the right posterior temporal, parietal, and occipital lobes. Increased low-density edema/ tumor in the right temporal lobe and right basal ganglia. There is extensive low-density enlarging the splenium of the corpus callosum which is unchanged. Dystrophic calcifications are present within the mass as noted  previously. Mass-effect on the right lateral ventricle which is compressed. 13 mm midline shift to the left shows mild progression in the interval. The ventricles are not enlarged. No acute hemorrhage. No acute infarct. Opacification of the right sphenoid sinus with mucosal thickening in the paranasal sinuses. Craniotomy changes in the right parietal region noted. IMPRESSION: Diffuse low-density mass in the right posterior cerebral hemisphere with dystrophic calcification. Increased low-density edema/tumor in the right temporal lobe and right basal ganglia. Increased mass-effect and midline shift now measuring 13 mm. No associated acute hemorrhage. Electronically Signed   By: Franchot Gallo M.D.   On: 10/08/2015 10:27   Dg Chest Portable 1 View  11/12/2015  CLINICAL DATA:  Shortness of breath, follow-up pneumonia EXAM: PORTABLE CHEST 1 VIEW COMPARISON:  10/06/2015 FINDINGS: Cardiomediastinal silhouette is stable. There is bilateral reticular interstitial prominence with slight worsening from prior exam. Mild perihilar increased bronchial markings. Findings highly suspicious for recurrent interstitial pneumonitis or mild interstitial edema. Clinical correlation is necessary. No segmental consolidation is noted. IMPRESSION: There is bilateral reticular interstitial prominence with slight worsening from prior exam. Mild perihilar increased bronchial markings. Findings highly suspicious for recurrent interstitial pneumonitis or mild interstitial edema. Clinical correlation is necessary. No segmental consolidation is noted. Electronically Signed   By: Lahoma Crocker M.D.   On: 11/17/2015 15:41   Dg Chest Port 1 View  10/06/2015  CLINICAL DATA:  Sepsis. Glioma within the occipital lobe. Decreased blood pressure. EXAM: PORTABLE CHEST 1 VIEW COMPARISON:  07/03/2015 FINDINGS: Heart size is normal. No pleural effusion identified. Bilateral interstitial opacities are identified which appear new from previous exam. No lobar  consolidation. IMPRESSION: 1. Bilateral interstitial opacities which may reflect atypical infection or edema. Electronically Signed   By: Kerby Moors M.D.   On: 10/06/2015 19:03    ASSESSMENT & PLAN:   60 year old Caucasian male with  #1  glioblastoma multiforme status post subtotal resection and concurrent chemoradiation in January 2016 followed by multiple lines of therapy including adjuvant Temodar, phase I vaccine trial at [name], high-dose Avastin, Irinotecan. He was last seen by Dr. Burr Medico in clinic on 10/18/2015. There was a detailed discussion regarding his goals of care and  it was a plan to continue him on Avastin and dexamethasone and consider hospice if he had disease progression. Plan -Patient is currently admitted with evidence of disease progression on a CT of the head. His functional status is extremely poor currently and this appears to be due to his primary diagnosis of GBM.  -Would recommend continuing goals of care with the patient's family and healthcare proxy. -No clearly effective treatment available at this time and the patient's functional status is significantly compromised at this time. -Transition of his cares with comfort care approach and consideration of hospice services would be quite reasonable from an oncology standpoint. -I called patients wife Jake [name] to see if she had any questions. She appears to be fixated on continuing to try to address the vasogenic edema with increasing doses of steroids and continuing the Avastin. I mentioned to her that though I personally did not feel that would make a significant difference in the progressive course of events it is a discussion she could pursue with Dr Burr Medico. -Will inform Dr. Burr Medico of the patient's current status though she is currently out of the [place] and she'll be returning on Monday. -As per Dr Ernestina Penna last note the patient was to be DNR/DNI but his wife appears to be quite conflicted with this call and currently the  patient is full code. -would benefit from a family meeting with Dr Burr Medico, hospitalist team and palliative care team early next week.   Sullivan Lone MD [place] AAHIVMS [facility] Hematology/Oncology Physician [facility]  (Office):       [phone] (Work cell):  [phone] (Fax):           [phone]  11/03/2015 1:27 PM

## 2015-11-03 NOTE — Progress Notes (Signed)
PT Cancellation Note  Patient Details Name: Lavale Sender MRN: [id number] DOB: [birth year]   Cancelled Treatment:    Reason Eval/Treat Not Completed: Fatigue/lethargy limiting ability to participate. Pt did not respond to verbal stimuli or sternal rub. Will check back another day.    Weston Anna, MPT Pager: [phone]

## 2015-11-03 NOTE — Progress Notes (Signed)
OT Cancellation Note  Patient Details Name: Jake [name] MRN: [id number] DOB: July 05, 1956   Cancelled Treatment:    Reason Eval/Treat Not Completed: Medical issues which prohibited therapy -- Attempted evaluation. Patient is lethargic, not arousable to verbal stimuli, sternal rub. Will reattempt evaluation at a later time.  Giorgi Debruin A 11/03/2015, 10:20 AM

## 2015-11-04 ENCOUNTER — Inpatient Hospital Stay (HOSPITAL_COMMUNITY): Payer: 59

## 2015-11-04 MED ORDER — SODIUM CHLORIDE 0.9 % IV SOLN: INTRAVENOUS | Status: DC

## 2015-11-04 MED ORDER — DEXTROSE-NACL 5-0.9 % IV SOLN
INTRAVENOUS | Status: DC
  Administered 2015-11-04 – 2015-11-05 (×4): via INTRAVENOUS

## 2015-11-04 MED ORDER — LEVOTHYROXINE SODIUM 100 MCG IV SOLR
12.5000 ug | Freq: Every day | INTRAVENOUS | Status: DC
  Administered 2015-11-04 – 2015-11-05 (×2): 12.5 ug via INTRAVENOUS
  Filled 2015-11-04 (×5): qty 5

## 2015-11-04 NOTE — Progress Notes (Signed)
Pt had a small BM this a.m. And had a small amount of bright red blood in stool. Will monitor.

## 2015-11-04 NOTE — Progress Notes (Signed)
OT Cancellation Note  Patient Details Name: Jake [name] MRN: [id number] DOB: [birth year]   Cancelled Treatment:    Reason Eval/Treat Not Completed: Other (comment)  Noted pt cancelled tx about 30 minutes ago.  Will check back Monday.  Kalista Laguardia 11/04/2015, 3:33 PM  Lesle Chris, OTR/L [phone] 11/04/2015

## 2015-11-04 NOTE — Progress Notes (Signed)
SLP Cancellation Note  Patient Details Name: Steffin Smedley MRN: [id number] DOB: 03/07/1956   Cancelled treatment:       Reason Eval/Treat Not Completed: Patient's level of consciousness  Gabriel Rainwater [place], CCC-SLP [phone]  Gabriel Rainwater Meryl 11/04/2015, 12:21 PM

## 2015-11-04 NOTE — Progress Notes (Signed)
Follow up note  I spoke with Mr Lisa Roca wife, undated her on his condition. RN reporting that he had temperature of 96. I reassessed him at bedside, appears mottled. I believe he is in the process of dying. His wife would like to continue current course of treatment with IV fluids, IV antibiotic therapy, IV steroids. I discussed code status, she requested that he be changed to a DNR. Orders placed in chart.

## 2015-11-04 NOTE — Progress Notes (Signed)
PT Cancellation Note  Patient Details Name: Jake [name] MRN: [id number] DOB: Sep 18, 1955   Cancelled Treatment:    Reason Eval/Treat Not Completed: Fatigue/lethargy limiting ability to participate   Weston Anna, MPT Pager: [phone]

## 2015-11-04 NOTE — Progress Notes (Signed)
[facility] PROGRESS NOTE  Jake [name] [id number] DOB: [birth year] DOA: 11/14/2015 PCP: Truitt Merle, MD  Assessment/Plan: 1. Glioblastoma multiforme -Jake [name] having a history of glioblastoma multiforme diagnosed in 2015, status post subtotal right parietal tumor resection undergoing concurrent radiation with Temodar. Treatment stopped in 2016 due to evidence of disease progression. He was then treated with Bevacizumab from 01/06/2015 through 05/26/2015, discontinued due to disease progression. -He was last seen by medical oncology on 10/18/2015. Oncology notes stating he has had significant neurologic deterioration. Options had been discussed with family members during that visit that included continuing Avastin therapy versus transitioning to hospice. It appears family members expressed wishes to continue with Avastin therapy for now. -He presents with a significant deterioration in his mental status as a CT scan of brain overnight showed right occipital mass and extensive associated vasogenic edema tracking in the right hemisphere associate with a 1.5 cm right to left midline shift (previously 1.3 cm). -Prognosis is poor, having disease progression despite multiple treatment regimens. -On exam he is minimally responsive. -I think that palliative care/hospice would be appropriate.  -Spoke with his daughter who expresses preference to discuss further treatment options with Jake [name] primary oncologist Dr. Burr Medico. She is due to come back on Monday morning. -He remains minimally responsive. -Meanwhile will continue IV Decadron 10 mg every 6 hours.   2.  Suspected healthcare associated pneumonia versus aspiration pneumonia -Mental status changes secondary to brain tumor make an argument for aspiration pneumonia. -He was started on empiric broad-spectrum IV antimicrobial therapy with Zosyn and vancomycin.  3. Hypertension. - Blood pressure stable  4.  Goals of care -Patient approaching  terminal stage of disease. He has had evidence of disease progression despite multiple treatment regimens. His functional status has significantly declined over the last several months. CT scan showed a 1.5 cm midline shift. He is minimally responsive. -I spoke to his daughter who wishes to speak to his primary oncologist prior to making any decisions regarding comfort care.  Code Status: Full code Family Communication: Spoke to his daughter Disposition Plan: Plan for Dr Burr Medico to meet with family on Monday   Consultants:  Medical oncology  Palliative care   Antibiotics:  IV Zosyn  IV vancomycin  HPI/Subjective: Jake [name] is an unfortunate gentleman with a past medical history of glioblastoma multiform, status post subtotal right parietal tumor resection in 2015 with concurrent chemoradiation therapy, previous imaging studies showing evidence of disease progression, last saw his medical oncologist Dr. Burr Medico on 10/18/2015 at which time his dexamethasone was decreased to 8 mg by mouth 3 times a day. He has had intermittent confusion/mental status changes however had been oriented to the balloon place. I baseline. His poor functional status, presently residing at a skilled nursing facility. Physical therapy was recently discontinued given inability to participate. He was admitted to the medicine service on 12/01/2015 presenting with functional decline and worsening mental status. He had a CT scan of brain that showed right occipital mass and extensive associated vasogenic edema tracking in the right hemisphere now associate with a 1.57 m right to left midline shift, representing disease progression from previous study.  Objective: Filed Vitals:   11/03/15 2215 11/04/15 0530  BP: 142/95 122/86  Pulse: 98 88  Temp: 97.9 F (36.6 C) 97.4 F (36.3 C)  Resp: 18 20    Intake/Output Summary (Last 24 hours) at 11/04/15 1311 Last data filed at 11/04/15 0656  Gross per 24 hour  Intake      0  ml  Output    625 ml  Net   -625 ml   Filed Weights   11/18/2015 2305  Weight: 85.9 kg (189 lb 6 oz)    Exam:   General:  He is minimally responsive, difficult to arouse. Nonverbal  Cardiovascular: Regular rate rhythm normal S1-S2  Respiratory: Normal respiratory effort off of supplemental oxygen  Abdomen: Obese, soft nontender nondistended  Musculoskeletal: No edema  Neurological: Difficult to perform neurologic examination given encephalopathy. He is obtunded, nonverbal. He was unable to follow verbal commands.  Data Reviewed: Basic Metabolic Panel:  Recent Labs Lab 11/21/2015 1513 11/03/15 0353  NA 143 146*  K 3.7 3.5  CL 110 114*  CO2 22 23  GLUCOSE 122* 134*  BUN 32* 31*  CREATININE 0.63 0.53*  CALCIUM 9.1 8.9   Liver Function Tests:  Recent Labs Lab 11/14/2015 1513 11/03/15 0353  AST 67* 56*  ALT 148* 131*  ALKPHOS 155* 139*  BILITOT 1.0 0.7  PROT 5.8* 5.3*  ALBUMIN 2.9* 2.6*   No results for input(s): LIPASE, AMYLASE in the last 168 hours. No results for input(s): AMMONIA in the last 168 hours. CBC:  Recent Labs Lab 11/10/2015 1513 11/03/15 0353  WBC 8.8 6.9  HGB 13.6 12.4*  HCT 43.8 40.0  MCV 94.6 95.2  PLT 142* 137*   Cardiac Enzymes: No results for input(s): CKTOTAL, CKMB, CKMBINDEX, TROPONINI in the last 168 hours. BNP (last 3 results)  Recent Labs  10/06/15 2300  BNP 96.5    ProBNP (last 3 results) No results for input(s): PROBNP in the last 8760 hours.  CBG:  Recent Labs Lab 11/30/2015 1430  GLUCAP 98    Recent Results (from the past 240 hour(s))  Culture, blood (routine x 2) Call MD if unable to obtain prior to antibiotics being given     Status: None (Preliminary result)   Collection Time: 11/03/15 12:05 AM  Result Value Ref Range Status   Specimen Description BLOOD LEFT ARM  Final   Special Requests BOTTLES DRAWN AEROBIC ONLY 5CC  Final   Culture   Final    NO GROWTH 1 DAY Performed at [facility]     Report Status PENDING  Incomplete  Culture, blood (routine x 2) Call MD if unable to obtain prior to antibiotics being given     Status: None (Preliminary result)   Collection Time: 11/03/15 12:10 AM  Result Value Ref Range Status   Specimen Description BLOOD RIGHT ARM  Final   Special Requests BOTTLES DRAWN AEROBIC AND ANAEROBIC 5CC  Final   Culture   Final    NO GROWTH 1 DAY Performed at [facility]    Report Status PENDING  Incomplete     Studies: Ct Head Wo Contrast  11/23/2015  CLINICAL DATA:  Declining cognition. Occipital glioblastoma with radiation therapy. EXAM: CT HEAD WITHOUT CONTRAST TECHNIQUE: Contiguous axial images were obtained from the base of the skull through the vertex without intravenous contrast. COMPARISON:  10/08/2015 FINDINGS: Right occipital mass with extensive hypodensity in the white matter tracking in the right temporal, occipital, and parietal lobes and into the right frontal lobe. The mass itself probably involves part of the temporal lobe. Effacement of right-sided sulci; the mass effect from the process causes 1.5 cm of right to left midline shift (previously 1.3 cm) with effacement of the right lateral ventricle and trapping of the left lateral ventricle with associated dilation of the left temporal horn. There may be some slight uncal herniation  on the right side. Right craniotomy noted. Abnormal hypodensity involving the splenium of the corpus callosum observed crossing the midline to the left side. Acute on chronic right sphenoid sinusitis. There is atherosclerotic calcification of the cavernous carotid arteries bilaterally. Frothy air fluid level in a posterior right ethmoid air cell. IMPRESSION: 1. The right occipital mass and extensive associated vasogenic edema tracking in the right hemisphere is now associated with 1.5 cm right to left midline shift, previously 1.3 cm. This causes effacement of the right lateral ventricle, trapping of the left lateral  ventricle, and questionable uncal herniation. The mass also crosses the midline in the splenium of the corpus callosum. Electronically Signed   By: Van Clines M.D.   On: 11/23/2015 17:23   Dg Chest Portable 1 View  11/06/2015  CLINICAL DATA:  Shortness of breath, follow-up pneumonia EXAM: PORTABLE CHEST 1 VIEW COMPARISON:  10/06/2015 FINDINGS: Cardiomediastinal silhouette is stable. There is bilateral reticular interstitial prominence with slight worsening from prior exam. Mild perihilar increased bronchial markings. Findings highly suspicious for recurrent interstitial pneumonitis or mild interstitial edema. Clinical correlation is necessary. No segmental consolidation is noted. IMPRESSION: There is bilateral reticular interstitial prominence with slight worsening from prior exam. Mild perihilar increased bronchial markings. Findings highly suspicious for recurrent interstitial pneumonitis or mild interstitial edema. Clinical correlation is necessary. No segmental consolidation is noted. Electronically Signed   By: Lahoma Crocker M.D.   On: 11/10/2015 15:41    Scheduled Meds: . dexamethasone  10 mg Intravenous 4 times per day  . piperacillin-tazobactam (ZOSYN)  IV  3.375 g Intravenous 3 times per day  . vancomycin  1,000 mg Intravenous 3 times per day   Continuous Infusions: . dextrose 5 % and 0.9% NaCl 100 mL/hr at 11/04/15 1235    Active Problems:   Hypothyroidism   GBM (glioblastoma multiforme) (HCC)   Acute encephalopathy   Left-sided weakness   Physical deconditioning   Aspiration pneumonia ([place])    Time spent: 25 min    Jake [name]  [facility] Pager [phone]. If 7PM-7AM, please contact night-coverage at [email or web address], password [facility] 11/04/2015, 1:11 PM  LOS: 2 days

## 2015-11-05 LAB — BASIC METABOLIC PANEL
ANION GAP: 11 (ref 5–15)
BUN: 30 mg/dL — AB (ref 6–20)
CHLORIDE: 121 mmol/L — AB (ref 101–111)
CO2: 20 mmol/L — ABNORMAL LOW (ref 22–32)
Calcium: 8.6 mg/dL — ABNORMAL LOW (ref 8.9–10.3)
Creatinine, Ser: 1.01 mg/dL (ref 0.61–1.24)
GFR calc Af Amer: >60 mL/min (ref >60)
GLUCOSE: 174 mg/dL — AB (ref 65–99)
POTASSIUM: 3 mmol/L — AB (ref 3.5–5.1)
SODIUM: 152 mmol/L — AB (ref 135–145)

## 2015-11-05 LAB — CBC
HCT: 40.3 % (ref 39.0–52.0)
HEMOGLOBIN: 12.2 g/dL — AB (ref 13.0–17.0)
MCH: 29.1 pg (ref 26.0–34.0)
MCHC: 30.3 g/dL (ref 30.0–36.0)
MCV: 96.2 fL (ref 78.0–100.0)
Platelets: 113 10*3/uL — ABNORMAL LOW (ref 150–400)
RBC: 4.19 MIL/uL — ABNORMAL LOW (ref 4.22–5.81)
RDW: 20.1 % — AB (ref 11.5–15.5)
WBC: 8 10*3/uL (ref 4.0–10.5)

## 2015-11-05 LAB — VANCOMYCIN, TROUGH: VANCOMYCIN TR: 58 ug/mL — AB (ref 10.0–20.0)

## 2015-11-05 MED ORDER — HYDROMORPHONE HCL 1 MG/ML IJ SOLN
1.0000 mg | INTRAMUSCULAR | Status: DC | PRN
  Administered 2015-11-05 – 2015-11-07 (×16): 1 mg via INTRAVENOUS
  Filled 2015-11-05 (×16): qty 1

## 2015-11-05 NOTE — Progress Notes (Signed)
OT discharge Note  Patient Details Name: Jake [name] MRN: [id number] DOB: June 17, 1956   Cancelled Treatment:    Reason Eval/Treat Not Completed: OT screened, no needs identified, will sign off  Pt currently not medically ready for OT evaluation. OT to sign off and await reorder for more appropriate time.   Jeri Modena   OTR/L Pager: [phone] Office: [phone] .   Parke Poisson B 11/05/2015, 4:16 PM

## 2015-11-05 NOTE — Progress Notes (Signed)
Pharmacy Antibiotic Note  Jake [name] is a 60 y.o. male admitted on 11/01/2015 with pneumonia.  Pharmacy has been consulted for Vancomycin dosing.  Vancomycin trough was very high at 58, (goal 15-20).  SCr increased 0.53-->1.01.  Plan: D/c vancomycin dose. Recheck vancomycin level at 1800  Height: 5\' 1"  (154.9 cm) Weight: 189 lb 6 oz (85.9 kg) IBW/kg (Calculated) : 52.3  Temp (24hrs), Avg:96.9 F (36.1 C), Min:96.5 F (35.8 C), Max:97.5 F (36.4 C)   Recent Labs Lab 11/06/2015 1513 11/05/2015 1631 11/05/2015 2026 11/03/15 0353 11/05/15 0418  WBC 8.8  --   --  6.9 8.0  CREATININE 0.63  --   --  0.53* 1.01  LATICACIDVEN  --  1.96 1.44  --   --   VANCOTROUGH  --   --   --   --  58*    Estimated Creatinine Clearance: 72.3 mL/min (by C-G formula based on Cr of 1.01).    Allergies  Allergen Reactions  . Other Nausea And Vomiting    Steroids - cause cramping all through the body - Headaches    Antimicrobials this admission: vancomycin  Zosyn   Dose adjustments this admission: D/c vancomycin d/t vancomycin trough of 58 with 3/5 AM labs  Thank you for allowing pharmacy to be a part of this patient's care.  Nani Skillern Crowford 11/05/2015 6:13 AM

## 2015-11-05 NOTE — Progress Notes (Signed)
[facility] PROGRESS NOTE  Jake [name] [id number] DOB: [birth year] DOA: 11/30/2015 PCP: Truitt Merle, MD  Assessment/Plan: 1. Glioblastoma multiforme -Jake [name] having a history of glioblastoma multiforme diagnosed in 2015, status post subtotal right parietal tumor resection undergoing concurrent radiation with Temodar. Treatment stopped in 2016 due to evidence of disease progression. He was then treated with Bevacizumab from 01/06/2015 through 05/26/2015, discontinued due to disease progression. -He was last seen by medical oncology on 10/18/2015. Oncology notes stating he has had significant neurologic deterioration. Options had been discussed with family members during that visit that included continuing Avastin therapy versus transitioning to hospice. It appears family members expressed wishes to continue with Avastin therapy for now. -He presents with a significant deterioration in his mental status as a CT scan of brain overnight showed right occipital mass and extensive associated vasogenic edema tracking in the right hemisphere associate with a 1.5 cm right to left midline shift (previously 1.3 cm). -Prognosis is poor, having disease progression despite multiple treatment regimens. -On exam he is minimally responsive. -I think that palliative care/hospice would be appropriate.  -Spoke with his daughter who expresses preference to discuss further treatment options with Jake [name] primary oncologist Dr. Burr [name]. She is due to come back on Monday morning. -He has been on IV Decadron 10 mg every 6 hours.  -11/05/3015: Over the past 24 hours he has shown significant decline, lower extremities appearing mottled, breathing pattern changed seeming more consistent with Cheyne-Stokes type. I spoke to his wife about these changes, explained that he was likely in the process of dying. She is concerned for his comfort and felt that current pain regimen was not controlling his pain. I switched morphine 2  mg IV q 4 hours to dilaudid 1 mg IV q 2 hours.   2.  Suspected healthcare associated pneumonia versus aspiration pneumonia -Mental status changes secondary to brain tumor make an argument for aspiration pneumonia. -He was started on empiric broad-spectrum IV antimicrobial therapy with Zosyn and vancomycin.  3. Hypertension. - Blood pressure stable  4.  Goals of care -Patient approaching terminal stage of disease. He has had evidence of disease progression despite multiple treatment regimens. His functional status has significantly declined over the last several months. CT scan showed a 1.5 cm midline shift. He is minimally responsive. -I spoke to his daughter who wishes to speak to his primary oncologist prior to making any decisions regarding comfort care. -His wife expressed wishes for no heroic resuscitative efforts, he was made a DO NOT RESUSCITATE CODE STATUS.  Code Status: DNR Family Communication: Spoke to his daughter Disposition Plan: Plan for Dr Burr [name] to meet with family on Monday   Consultants:  Medical oncology  Palliative care  Antibiotics:  IV Zosyn  IV vancomycin  HPI/Subjective: Jake [name] is an unfortunate gentleman with a past medical history of glioblastoma multiform, status post subtotal right parietal tumor resection in 2015 with concurrent chemoradiation therapy, previous imaging studies showing evidence of disease progression, last saw his medical oncologist Dr. Burr [name] on 10/18/2015 at which time his dexamethasone was decreased to 8 mg by mouth 3 times a day. He has had intermittent confusion/mental status changes however had been oriented to the balloon place. I baseline. His poor functional status, presently residing at a skilled nursing facility. Physical therapy was recently discontinued given inability to participate. He was admitted to the medicine service on 11/23/2015 presenting with functional decline and worsening mental status. He had a CT scan of brain  that showed right occipital  mass and extensive associated vasogenic edema tracking in the right hemisphere now associate with a 1.57 m right to left midline shift, representing disease progression from previous study.  Objective: Filed Vitals:   11/04/15 2122 11/05/15 0429  BP: 127/85 142/84  Pulse: 73 62  Temp: 97 F (36.1 C) 96.7 F (35.9 C)  Resp: 16 18    Intake/Output Summary (Last 24 hours) at 11/05/15 1255 Last data filed at 11/05/15 0900  Gross per 24 hour  Intake      0 ml  Output    700 ml  Net   -700 ml   Filed Weights   11/21/2015 2305  Weight: 85.9 kg (189 lb 6 oz)    Exam:   General:  He is minimally responsive, difficult to arouse. Nonverbal  Cardiovascular: Regular rate rhythm normal S1-S2  Respiratory: Patient having [place] type respirations  Abdomen: Obese, soft nontender nondistended  Musculoskeletal: Mottling of lower extremities  Neurological: Not responsive  Data Reviewed: Basic Metabolic Panel:  Recent Labs Lab 11/30/2015 1513 11/03/15 0353 11/05/15 0418  NA 143 146* 152*  K 3.7 3.5 3.0*  CL 110 114* 121*  CO2 22 23 20*  GLUCOSE 122* 134* 174*  BUN 32* 31* 30*  CREATININE 0.63 0.53* 1.01  CALCIUM 9.1 8.9 8.6*   Liver Function Tests:  Recent Labs Lab 11/08/2015 1513 11/03/15 0353  AST 67* 56*  ALT 148* 131*  ALKPHOS 155* 139*  BILITOT 1.0 0.7  PROT 5.8* 5.3*  ALBUMIN 2.9* 2.6*   No results for input(s): LIPASE, AMYLASE in the last 168 hours. No results for input(s): AMMONIA in the last 168 hours. CBC:  Recent Labs Lab 11/01/2015 1513 11/03/15 0353 11/05/15 0418  WBC 8.8 6.9 8.0  HGB 13.6 12.4* 12.2*  HCT 43.8 40.0 40.3  MCV 94.6 95.2 96.2  PLT 142* 137* 113*   Cardiac Enzymes: No results for input(s): CKTOTAL, CKMB, CKMBINDEX, TROPONINI in the last 168 hours. BNP (last 3 results)  Recent Labs  10/06/15 2300  BNP 96.5    ProBNP (last 3 results) No results for input(s): PROBNP in the last 8760  hours.  CBG:  Recent Labs Lab 11/15/2015 1430  GLUCAP 98    Recent Results (from the past 240 hour(s))  Culture, blood (routine x 2) Call MD if unable to obtain prior to antibiotics being given     Status: None (Preliminary result)   Collection Time: 11/03/15 12:05 AM  Result Value Ref Range Status   Specimen Description BLOOD LEFT ARM  Final   Special Requests BOTTLES DRAWN AEROBIC ONLY 5CC  Final   Culture   Final    NO GROWTH 1 DAY Performed at [facility]    Report Status PENDING  Incomplete  Culture, blood (routine x 2) Call MD if unable to obtain prior to antibiotics being given     Status: None (Preliminary result)   Collection Time: 11/03/15 12:10 AM  Result Value Ref Range Status   Specimen Description BLOOD RIGHT ARM  Final   Special Requests BOTTLES DRAWN AEROBIC AND ANAEROBIC 5CC  Final   Culture   Final    NO GROWTH 1 DAY Performed at [facility]    Report Status PENDING  Incomplete     Studies: Dg Lumbar Spine Complete  11/04/2015  CLINICAL DATA:  Pt unable to verbalize hx due to pain, condition, medication. Per EPIC chart, hx of :glioblastoma multiform, status post subtotal right parietal tumor resection in 2015 with concurrent  chemoradiation therapy. EXAM: LUMBAR SPINE - COMPLETE 4+ VIEW COMPARISON:  07/03/2015 FINDINGS: Mild T11 wedge compression deformity, stable. Normal lumbar alignment. No lumbar fracture. Small endplate spurs from L3 and L4. No other significant osseous degenerative change. Patchy aortic calcifications without suggestion of aneurysm. IMPRESSION: 1. No acute lumbar abnormality. 2. Chronic mild T11 wedge compression deformity. 3. Atheromatous aorta. Electronically Signed   By: Lucrezia Europe M.D.   On: 11/04/2015 13:50    Scheduled Meds: . dexamethasone  10 mg Intravenous 4 times per day  . levothyroxine  12.5 mcg Intravenous Daily  . piperacillin-tazobactam (ZOSYN)  IV  3.375 g Intravenous 3 times per day   Continuous  Infusions: . dextrose 5 % and 0.9% NaCl 100 mL/hr at 11/05/15 1044    Active Problems:   Hypothyroidism   GBM (glioblastoma multiforme) (HCC)   Acute encephalopathy   Left-sided weakness   Physical deconditioning   Aspiration pneumonia ([place])    Time spent: 25 min    Kelvin Cellar  [facility] Pager [phone]. If 7PM-7AM, please contact night-coverage at [email or web address], password [facility] 11/05/2015, 12:55 PM  LOS: 3 days

## 2015-11-05 NOTE — Progress Notes (Signed)
Critical lab value, vancomycin trough= 58. On call provider notified, no new orders at this time, will continue to monitor.

## 2015-11-05 NOTE — Progress Notes (Signed)
PT  Note  Patient Details Name: Jake [name] MRN: [id number] DOB: [birth year]       PT eval attempted x 3 but not completed 2* pt's declining medical status.  Discussed with RN Jenny Reichmann H) and will dc PT services at this time.  Please reorder as needed with improvement in pt condition.   Read Bonelli 11/05/2015, 11:45 AM

## 2015-11-06 ENCOUNTER — Encounter: Payer: Self-pay | Admitting: *Deleted

## 2015-11-06 DIAGNOSIS — G9340 Encephalopathy, unspecified: Secondary | ICD-10-CM

## 2015-11-06 DIAGNOSIS — E669 Obesity, unspecified: Secondary | ICD-10-CM

## 2015-11-06 DIAGNOSIS — R740 Nonspecific elevation of levels of transaminase and lactic acid dehydrogenase [LDH]: Secondary | ICD-10-CM

## 2015-11-06 DIAGNOSIS — E46 Unspecified protein-calorie malnutrition: Secondary | ICD-10-CM

## 2015-11-06 DIAGNOSIS — I10 Essential (primary) hypertension: Secondary | ICD-10-CM

## 2015-11-06 DIAGNOSIS — D696 Thrombocytopenia, unspecified: Secondary | ICD-10-CM

## 2015-11-06 DIAGNOSIS — E878 Other disorders of electrolyte and fluid balance, not elsewhere classified: Secondary | ICD-10-CM

## 2015-11-06 NOTE — Progress Notes (Signed)
Jake [name]   DOB:08/01/1956   [id number]   [id number]  Subjective: Pt is well-known to me, has been under my care for his recurrent GBM. He was admitted last week for worsening mental status. Pt has been unresponsive since yesterday, has had intermittent breathing pause, I saw him and talked to his wife Jake [name] at bedside today, and offered emotional support.    Objective:  Filed Vitals:   11/05/15 1315 11/06/15 0520  BP: 140/81 132/96  Pulse: 61 97  Temp:  96.4 F (35.8 C)  Resp: 16 12    Body mass index is 35.8 kg/(m^2).  Intake/Output Summary (Last 24 hours) at 11/06/15 2313 Last data filed at 11/06/15 1838  Gross per 24 hour  Intake      0 ml  Output    450 ml  Net   -450 ml   Physical exam not performed   CBG (last 3)  No results for input(s): GLUCAP in the last 72 hours.   Labs:  Lab Results  Component Value Date   WBC 8.0 11/05/2015   HGB 12.2* 11/05/2015   HCT 40.3 11/05/2015   MCV 96.2 11/05/2015   PLT 113* 11/05/2015   NEUTROABS 10.0* 10/18/2015    @LASTCHEMISTRY @  Urine Studies No results for input(s): UHGB, CRYS in the last 72 hours.  Invalid input(s): UACOL, UAPR, USPG, UPH, UTP, UGL, UKET, UBIL, UNIT, UROB, ULEU, UEPI, UWBC, [place], [place], [place], [place], [place]  Basic Metabolic Panel:  Recent Labs Lab 12/01/2015 1513 11/03/15 0353 11/05/15 0418  NA 143 146* 152*  K 3.7 3.5 3.0*  CL 110 114* 121*  CO2 22 23 20*  GLUCOSE 122* 134* 174*  BUN 32* 31* 30*  CREATININE 0.63 0.53* 1.01  CALCIUM 9.1 8.9 8.6*   GFR Estimated Creatinine Clearance: 72.3 mL/min (by C-G formula based on Cr of 1.01). Liver Function Tests:  Recent Labs Lab 11/30/2015 1513 11/03/15 0353  AST 67* 56*  ALT 148* 131*  ALKPHOS 155* 139*  BILITOT 1.0 0.7  PROT 5.8* 5.3*  ALBUMIN 2.9* 2.6*   No results for input(s): LIPASE, AMYLASE in the last 168 hours. No results for input(s): AMMONIA in the last 168 hours. Coagulation profile No results for input(s): INR, PROTIME  in the last 168 hours.  CBC:  Recent Labs Lab 11/05/2015 1513 11/03/15 0353 11/05/15 0418  WBC 8.8 6.9 8.0  HGB 13.6 12.4* 12.2*  HCT 43.8 40.0 40.3  MCV 94.6 95.2 96.2  PLT 142* 137* 113*   Cardiac Enzymes: No results for input(s): CKTOTAL, CKMB, CKMBINDEX, TROPONINI in the last 168 hours. BNP: Invalid input(s): POCBNP CBG:  Recent Labs Lab 11/30/2015 1430  GLUCAP 98   D-Dimer No results for input(s): DDIMER in the last 72 hours. Hgb A1c No results for input(s): HGBA1C in the last 72 hours. Lipid Profile No results for input(s): CHOL, HDL, LDLCALC, TRIG, CHOLHDL, LDLDIRECT in the last 72 hours. Thyroid function studies No results for input(s): TSH, T4TOTAL, T3FREE, THYROIDAB in the last 72 hours.  Invalid input(s): FREET3 Anemia work up No results for input(s): VITAMINB12, FOLATE, FERRITIN, TIBC, IRON, RETICCTPCT in the last 72 hours. Microbiology Recent Results (from the past 240 hour(s))  Culture, blood (routine x 2) Call MD if unable to obtain prior to antibiotics being given     Status: None (Preliminary result)   Collection Time: 11/03/15 12:05 AM  Result Value Ref Range Status   Specimen Description BLOOD LEFT ARM  Final   Special Requests BOTTLES DRAWN AEROBIC ONLY  5CC  Final   Culture   Final    NO GROWTH 3 DAYS Performed at [facility]    Report Status PENDING  Incomplete  Culture, blood (routine x 2) Call MD if unable to obtain prior to antibiotics being given     Status: None (Preliminary result)   Collection Time: 11/03/15 12:10 AM  Result Value Ref Range Status   Specimen Description BLOOD RIGHT ARM  Final   Special Requests BOTTLES DRAWN AEROBIC AND ANAEROBIC 5CC  Final   Culture   Final    NO GROWTH 3 DAYS Performed at [facility]    Report Status PENDING  Incomplete      Studies:  No results found.  Assessment: 60 y.o.   1. Recurrent GBM, with disease progression on recent multiple brain scan, significant vasogenic  edema with associated worsening midline shift (1.5cm now)  2. Acute encephalopathy due to #1 3. Possible HAP  4. HTN 5. transaminitis 6. Thrombocytopenia  7. Electrolytes abnormalities 8. malnutrition and obesity    Plan:  -I have previously discussed hospice if his condition continues deteriorating like this, pt's wife Jake [name] fully understands his dismal prognosis and agree with the goal of care being palliative and comfort. -Pt is actively dying. I spoke with Dr. Coralyn Pear today and we plan to keep him in house for now  -I will inform his oncologist Dr. Isaac Laud at [facility] -I suggest no more lab or monitoring  -will follow up   Truitt Merle, MD 11/06/2015  11:13 PM

## 2015-11-06 NOTE — Clinical Documentation Improvement (Signed)
Internal Medicine  Please further specify the diagnosis of Acute Encephalopathy and document findings in next progress note NOT in BPA drop down box. Thank you!   Metabolic Encephalopathy  Toxic Encephalopathy  Other Type of Encephalopathy  Clinically Undetermined  Supporting Information:  Being treated for likely terminal GBM  Please exercise your independent, professional judgment when responding. A specific answer is not anticipated or expected.  Thank You, Zoila Shutter RN, BSN, [place] [phone]; Cell: [phone]

## 2015-11-06 NOTE — Progress Notes (Signed)
[facility] PROGRESS NOTE  Jake [name] [id number] DOB: Jan 20, 1956 DOA: 11/19/2015 PCP: Truitt Merle, MD  Assessment/Plan: 1. Glioblastoma multiforme -Jake [name] having a history of glioblastoma multiforme diagnosed in 2015, status post subtotal right parietal tumor resection undergoing concurrent radiation with Temodar. Treatment stopped in 2016 due to evidence of disease progression. He was then treated with Bevacizumab from 01/06/2015 through 05/26/2015, discontinued due to disease progression. -He was last seen by medical oncology on 10/18/2015. Oncology notes stating he has had significant neurologic deterioration. Options had been discussed with family members during that visit that included continuing Avastin therapy versus transitioning to hospice. It appears family members expressed wishes to continue with Avastin therapy for now. -He presents with a significant deterioration in his mental status as a CT scan of brain overnight showed right occipital mass and extensive associated vasogenic edema tracking in the right hemisphere associate with a 1.5 cm right to left midline shift (previously 1.3 cm). -Prognosis is poor, having disease progression despite multiple treatment regimens. -On exam he is minimally responsive. -I think that palliative care/hospice would be appropriate.  -Spoke with his daughter who expresses preference to discuss further treatment options with Jake [name] primary oncologist Dr. Burr Medico. She is due to come back on Monday morning. -He has been on IV Decadron 10 mg every 6 hours.  -11/05/3015: Over the past 24 hours he has shown significant decline, lower extremities appearing mottled, breathing pattern changed seeming more consistent with Cheyne-Stokes type. I spoke to his wife about these changes, explained that he was likely in the process of dying. She is concerned for his comfort and felt that current pain regimen was not controlling his pain. I switched morphine 2  mg IV q 4 hours to dilaudid 1 mg IV q 2 hours.  -On 11/06/2015 wife at bedside reporting that pain is better controlled.  2.  Suspected healthcare associated pneumonia versus aspiration pneumonia -Mental status changes secondary to brain tumor make an argument for aspiration pneumonia. -He was started on empiric broad-spectrum IV antimicrobial therapy with Zosyn and vancomycin.  3. Hypertension. - Blood pressure stable  4.  Goals of care -Patient approaching terminal stage of disease. He has had evidence of disease progression despite multiple treatment regimens. His functional status has significantly declined over the last several months. CT scan showed a 1.5 cm midline shift. He is minimally responsive. -I spoke to his daughter who wishes to speak to his primary oncologist prior to making any decisions regarding comfort care. -His wife expressed wishes for no heroic resuscitative efforts, he was made a DO NOT RESUSCITATE CODE STATUS. -I think he may pass in the next 24-48 hours   Code Status: DNR Family Communication: Spoke to his daughter, who was at bedside Disposition Plan: Plan for Dr Burr Medico to meet with family on Monday   Consultants:  Medical oncology  Palliative care  Antibiotics:  IV Zosyn  IV vancomycin  HPI/Subjective: Jake [name] is an unfortunate gentleman with a past medical history of glioblastoma multiform, status post subtotal right parietal tumor resection in 2015 with concurrent chemoradiation therapy, previous imaging studies showing evidence of disease progression, last saw his medical oncologist Dr. Burr Medico on 10/18/2015 at which time his dexamethasone was decreased to 8 mg by mouth 3 times a day. He has had intermittent confusion/mental status changes however had been oriented to the balloon place. I baseline. His poor functional status, presently residing at a skilled nursing facility. Physical therapy was recently discontinued given inability to participate. He  was  admitted to the medicine service on 11/14/2015 presenting with functional decline and worsening mental status. He had a CT scan of brain that showed right occipital mass and extensive associated vasogenic edema tracking in the right hemisphere now associate with a 1.57 m right to left midline shift, representing disease progression from previous study.  Objective: Filed Vitals:   11/05/15 1315 11/06/15 0520  BP: 140/81 132/96  Pulse: 61 97  Temp: 98.2 F (36.8 C) 96.4 F (35.8 C)  Resp: 16 12    Intake/Output Summary (Last 24 hours) at 11/06/15 0920 Last data filed at 11/06/15 [id number]  Gross per 24 hour  Intake      0 ml  Output   1350 ml  Net  -1350 ml   Filed Weights   11/29/2015 2305  Weight: 85.9 kg (189 lb 6 oz)    Exam:   General:  He is unresponsive, having [place] breathing pattern  Cardiovascular: Regular rate rhythm normal S1-S2  Respiratory: Patient having [place] type respirations  Abdomen: Obese, soft nontender nondistended  Musculoskeletal: Mottling of lower extremities  Neurological: Not responsive  Data Reviewed: Basic Metabolic Panel:  Recent Labs Lab 12/01/2015 1513 11/03/15 0353 11/05/15 0418  NA 143 146* 152*  K 3.7 3.5 3.0*  CL 110 114* 121*  CO2 22 23 20*  GLUCOSE 122* 134* 174*  BUN 32* 31* 30*  CREATININE 0.63 0.53* 1.01  CALCIUM 9.1 8.9 8.6*   Liver Function Tests:  Recent Labs Lab 11/03/2015 1513 11/03/15 0353  AST 67* 56*  ALT 148* 131*  ALKPHOS 155* 139*  BILITOT 1.0 0.7  PROT 5.8* 5.3*  ALBUMIN 2.9* 2.6*   No results for input(s): LIPASE, AMYLASE in the last 168 hours. No results for input(s): AMMONIA in the last 168 hours. CBC:  Recent Labs Lab 12/01/2015 1513 11/03/15 0353 11/05/15 0418  WBC 8.8 6.9 8.0  HGB 13.6 12.4* 12.2*  HCT 43.8 40.0 40.3  MCV 94.6 95.2 96.2  PLT 142* 137* 113*   Cardiac Enzymes: No results for input(s): CKTOTAL, CKMB, CKMBINDEX, TROPONINI in the last 168 hours. BNP  (last 3 results)  Recent Labs  10/06/15 2300  BNP 96.5    ProBNP (last 3 results) No results for input(s): PROBNP in the last 8760 hours.  CBG:  Recent Labs Lab 11/03/2015 1430  GLUCAP 98    Recent Results (from the past 240 hour(s))  Culture, blood (routine x 2) Call MD if unable to obtain prior to antibiotics being given     Status: None (Preliminary result)   Collection Time: 11/03/15 12:05 AM  Result Value Ref Range Status   Specimen Description BLOOD LEFT ARM  Final   Special Requests BOTTLES DRAWN AEROBIC ONLY 5CC  Final   Culture   Final    NO GROWTH 2 DAYS Performed at [facility]    Report Status PENDING  Incomplete  Culture, blood (routine x 2) Call MD if unable to obtain prior to antibiotics being given     Status: None (Preliminary result)   Collection Time: 11/03/15 12:10 AM  Result Value Ref Range Status   Specimen Description BLOOD RIGHT ARM  Final   Special Requests BOTTLES DRAWN AEROBIC AND ANAEROBIC 5CC  Final   Culture   Final    NO GROWTH 2 DAYS Performed at [facility]    Report Status PENDING  Incomplete     Studies: Dg Lumbar Spine Complete  11/04/2015  CLINICAL DATA:  Pt unable to verbalize  hx due to pain, condition, medication. Per EPIC chart, hx of :glioblastoma multiform, status post subtotal right parietal tumor resection in 2015 with concurrent chemoradiation therapy. EXAM: LUMBAR SPINE - COMPLETE 4+ VIEW COMPARISON:  07/03/2015 FINDINGS: Mild T11 wedge compression deformity, stable. Normal lumbar alignment. No lumbar fracture. Small endplate spurs from L3 and L4. No other significant osseous degenerative change. Patchy aortic calcifications without suggestion of aneurysm. IMPRESSION: 1. No acute lumbar abnormality. 2. Chronic mild T11 wedge compression deformity. 3. Atheromatous aorta. Electronically Signed   By: Lucrezia Europe M.D.   On: 11/04/2015 13:50    Scheduled Meds: . dexamethasone  10 mg Intravenous 4 times per day  .  levothyroxine  12.5 mcg Intravenous Daily  . piperacillin-tazobactam (ZOSYN)  IV  3.375 g Intravenous 3 times per day   Continuous Infusions:    Active Problems:   Hypothyroidism   GBM (glioblastoma multiforme) ([place])   Acute encephalopathy   Left-sided weakness   Physical deconditioning   Aspiration pneumonia ([place])    Time spent: 15 min    Kelvin Cellar  [facility] Pager [phone]. If 7PM-7AM, please contact night-coverage at [email or web address], password [email or web address] 11/06/2015, 9:20 AM  LOS: 4 days

## 2015-11-06 NOTE — Progress Notes (Signed)
Spiritual Care Note  Received referral from [facility] re pt's admission and decline.  Have been following [name]") and his wife Jake [name] through Brain Tumor Support Group and f/u care at [facility].  Spent ca 45 minutes with Jake [name] at [name]'s bedside, offering emotional support and opportunity for her to process her grief.  Provided pastoral reflection, normalization of feelings, information about grief support (hospice 1:1 counseling/support groups, chaplain care), and assistance in beginning to address hopes for [employer] service.  Per Jake [name], couple joined a church shortly before pt's dx, and they would like service to be there, but she has very little concept of what a funeral/celebration of life could look like.  Offered brainstorming support and collaborative assistance with church leadership; Jake [name] verbalized relief, gratitude, and eagerness for such assistance when she is ready, planning to call me at that time.  Following for support, but please also page as needs arise/circumstances change.  Thank you.  [place], MDiv, [place] M-F daytime pager [facility] 24/7 pager [phone]

## 2015-11-06 NOTE — Progress Notes (Signed)
[place] Work  Clinical Social Work was referred by CSW screening and need for support/reassessment of psychosocial needs.  Clinical Social Worker met with patient's wife, [name] in the hospital to offer support and assess for needs.  [facility] CSW had been following them since pt's diagnosis. Pt and his wife were frequent attendees at the [place] at [facility]. CSW provided emotional support and supportive listening while visiting at pt's bedside. Wife expressed concerns about affording arrangements for pt and CSW will review possible resources. CSW explained to wife that very little to no support exists for this and that the least expensive option is cremation through [employer]. CSW to consider options and will also follow up with wife later today. CSW also alerted Lorrin Jackson, chaplain of admission for additional support.   Clinical Social Work interventions: Supportive listening Resource assistance  Loren Racer, [place] Worker [place]  [place] Phone: [phone] Fax: [phone]

## 2015-11-06 NOTE — Progress Notes (Signed)
Palliative Medicine RN Note: Goals have already been set, and symptoms are managed. Due to these issues already being resolved, PMT will not see the patient. Please re-consult if needed. Larina Earthly, RN, BSN, [facility] 11/06/2015 9:30 AM Cell [phone] 8:00-4:00 Monday-Friday Office [phone]

## 2015-11-07 MED ORDER — LORAZEPAM 2 MG/ML PO CONC: 1.0000 mg | ORAL | Status: DC | PRN

## 2015-11-07 MED ORDER — LORAZEPAM 2 MG/ML IJ SOLN: 1.0000 mg | INTRAMUSCULAR | Status: DC | PRN

## 2015-11-07 MED ORDER — LORAZEPAM 1 MG PO TABS: 1.0000 mg | ORAL_TABLET | ORAL | Status: DC | PRN

## 2015-11-07 MED ORDER — SCOPOLAMINE 1 MG/3DAYS TD PT72
1.0000 | MEDICATED_PATCH | TRANSDERMAL | Status: DC
  Filled 2015-11-07: qty 1

## 2015-11-07 MED ORDER — SODIUM CHLORIDE 0.9 % IV SOLN: 250.0000 mL | INTRAVENOUS | Status: DC | PRN

## 2015-11-07 MED ORDER — HYDROMORPHONE BOLUS VIA INFUSION
0.5000 mg | INTRAVENOUS | Status: DC | PRN
  Administered 2015-11-07 (×2): 0.5 mg via INTRAVENOUS
  Filled 2015-11-07 (×3): qty 1

## 2015-11-07 MED ORDER — SODIUM CHLORIDE 0.9 % IV SOLN
0.2500 mg/h | INTRAVENOUS | Status: DC
  Administered 2015-11-07: 0.25 mg/h via INTRAVENOUS
  Filled 2015-11-07: qty 2.5

## 2015-11-07 MED ORDER — SODIUM CHLORIDE 0.9% FLUSH: 3.0000 mL | INTRAVENOUS | Status: DC | PRN

## 2015-11-07 MED ORDER — SODIUM CHLORIDE 0.9% FLUSH: 3.0000 mL | Freq: Two times a day (BID) | INTRAVENOUS | Status: DC

## 2015-11-08 ENCOUNTER — Telehealth: Payer: Self-pay | Admitting: *Deleted

## 2015-11-08 ENCOUNTER — Encounter: Payer: Self-pay | Admitting: General Practice

## 2015-11-08 LAB — CULTURE, BLOOD (ROUTINE X 2)
CULTURE: NO GROWTH
Culture: NO GROWTH

## 2015-11-08 NOTE — Telephone Encounter (Signed)
"  This is Stevie with the [place] at [facility].  Mutual patient passed away last night.  His wife just let us know he wanted to donate his brain.  We can fax a consent to [phone] for his wife and Dr. Burr Medico to sign to be witnessed so we can harvest today."  Will notify Dr. Burr Medico.  Otila Kluver will also e-mail provider.

## 2015-11-08 NOTE — Progress Notes (Signed)
Called to room by wife, pt assessed, no heart tones or breath sounds auscultated, and pulse was absent.   Emmit Pomfret, RN verified, time of death Dec 31, 2213.  Hydromorphone drip, 30 ml discarded down sink, witnessed by Samule Ohm, RN.  Wife at bedside, on call provider notified.  Death certificate completed.  [place] notified, pt candidate for eyes and tissue donation. Eyes prepped,  CDS # [id number].  IV and foley removed. Pt transported to morgue.

## 2015-11-08 NOTE — Progress Notes (Signed)
Spiritual Care Note  Preparing a handwritten sympathy card and packet of support resources, including [facility] grief counseling and [place], to mail to spouse Jake [name].  Will continue to follow her for bereavement support and assistance with memorial service planning as she desires, per previous notes.  [place], [place], [facility] Pager [phone] Voicemail  [phone]

## 2015-11-10 ENCOUNTER — Other Ambulatory Visit: Payer: Self-pay

## 2015-11-10 ENCOUNTER — Ambulatory Visit: Payer: Self-pay

## 2015-11-10 ENCOUNTER — Ambulatory Visit: Payer: Self-pay | Admitting: Hematology

## 2015-11-21 DIAGNOSIS — E43 Unspecified severe protein-calorie malnutrition: Secondary | ICD-10-CM | POA: Diagnosis present

## 2015-12-02 NOTE — Progress Notes (Signed)
Wife refused morning labs.

## 2015-12-02 NOTE — Progress Notes (Signed)
[facility] PROGRESS NOTE  Jadyel Rattler [id number] DOB: Nov 26, 1955 DOA: 11/30/2015 PCP: Truitt Merle, MD   Interim summary [name] is an unfortunate gentleman with a past medical history of glioblastoma multiform, status post subtotal right parietal tumor resection in 2015 with concurrent chemoradiation therapy, previous imaging studies showing evidence of disease progression, last saw his medical oncologist Dr. Burr Medico on 10/18/2015 at which time his dexamethasone was decreased to 8 mg by mouth 3 times a day. He has had intermittent confusion/mental status changes however had been oriented to the balloon place. At baseline he has a poor functional status who had been residing at a skilled nursing facility. Physical therapy was recently discontinued given inability to participate. He was admitted to the medicine service on 11/11/2015 presenting with functional decline and worsening mental status. He had a CT scan of brain that showed right occipital mass and extensive associated vasogenic edema tracking in the right hemisphere now associate with a 1.57 m right to left midline shift, representing disease progression from previous study. He has had a decline during this hospitalization becoming unresponsive. On 11-26-15 he was transitioned to full comfort.   Assessment/Plan: 1. Glioblastoma multiforme -[name] having a history of glioblastoma multiforme diagnosed in 2015, status post subtotal right parietal tumor resection undergoing concurrent radiation with Temodar. Treatment stopped in 2016 due to evidence of disease progression. He was then treated with Bevacizumab from 01/06/2015 through 05/26/2015, discontinued due to disease progression. -He was last seen by medical oncology on 10/18/2015. Oncology notes stating he has had significant neurologic deterioration. Options had been discussed with family members during that visit that included continuing Avastin therapy versus transitioning to  hospice. It appears family members expressed wishes to continue with Avastin therapy then -He presents with a significant deterioration in his mental status as a CT scan of brain overnight showed right occipital mass and extensive associated vasogenic edema tracking in the right hemisphere associate with a 1.5 cm right to left midline shift (previously 1.3 cm). -Spoke with his daughter who expresses preference to discuss further treatment options with Mr.Kizziah primary oncologist Dr. Burr Medico. She is due to come back on Monday morning. -He has been on IV Decadron 10 mg every 6 hours.  -11/05/3015: Over the past 24 hours he has shown significant decline, lower extremities appearing mottled, breathing pattern changed seeming more consistent with Cheyne-Stokes type. I spoke to his wife about these changes, explained that he was likely in the process of dying. She is concerned for his comfort and felt that current pain regimen was not controlling his pain. I switched morphine 2 mg IV q 4 hours to dilaudid 1 mg IV q 2 hours.  -On 2015/11/26 spoke with his wife, she was concerned with him being in pain. Placed him on Dilaudid drip at 0.25 mg. His wife expresses wishes for full comfort. I have discontinued IV fluids and IV antimicrobial therapy.  2.  Suspected healthcare associated pneumonia versus aspiration pneumonia -He has been treated with vancomycin and Zosyn during this hospitalization, discontinued as he has full comfort  3.  Goals of care -Patient approaching terminal stage of disease. He has had evidence of disease progression despite multiple treatment regimens. His functional status has significantly declined over the last several months. CT scan showed a 1.5 cm midline shift. He is minimally responsive. -I spoke to his daughter who wishes to speak to his primary oncologist prior to making any decisions regarding comfort care. -His wife expressed wishes for no heroic resuscitative efforts,  he was made a  DO NOT RESUSCITATE CODE STATUS. -He is now for comfort care -I think he may pass in the next 24-48 hours   Code Status: DNR Family Communication: Spoke to his wife at bedside Disposition Plan: Transition to full comfort care   Consultants:  Medical oncology  Palliative care  Antibiotics:  IV Zosyn  IV vancomycin  HPI/Subjective: He is unresponsive, exhibiting [place] breathing pattern  Objective: Filed Vitals:   11/05/15 1315 11/06/15 0520  BP:  132/96  Pulse:  97  Temp:  96.4 F (35.8 C)  Resp: 16 12    Intake/Output Summary (Last 24 hours) at 11-30-2015 1444 Last data filed at 11/06/15 1838  Gross per 24 hour  Intake      0 ml  Output    300 ml  Net   -300 ml   Filed Weights   11/17/2015 2305  Weight: 85.9 kg (189 lb 6 oz)    Exam:   General:  He is unresponsive, having [place] breathing pattern  Cardiovascular: Regular rate rhythm normal S1-S2  Respiratory: Patient having [place] type respirations  Abdomen: Obese, soft nontender nondistended  Musculoskeletal: Mottling of lower extremities  Neurological: Not responsive  Data Reviewed: Basic Metabolic Panel:  Recent Labs Lab 11/22/2015 1513 11/03/15 0353 11/05/15 0418  NA 143 146* 152*  K 3.7 3.5 3.0*  CL 110 114* 121*  CO2 22 23 20*  GLUCOSE 122* 134* 174*  BUN 32* 31* 30*  CREATININE 0.63 0.53* 1.01  CALCIUM 9.1 8.9 8.6*   Liver Function Tests:  Recent Labs Lab 11/22/2015 1513 11/03/15 0353  AST 67* 56*  ALT 148* 131*  ALKPHOS 155* 139*  BILITOT 1.0 0.7  PROT 5.8* 5.3*  ALBUMIN 2.9* 2.6*   No results for input(s): LIPASE, AMYLASE in the last 168 hours. No results for input(s): AMMONIA in the last 168 hours. CBC:  Recent Labs Lab 11/17/2015 1513 11/03/15 0353 11/05/15 0418  WBC 8.8 6.9 8.0  HGB 13.6 12.4* 12.2*  HCT 43.8 40.0 40.3  MCV 94.6 95.2 96.2  PLT 142* 137* 113*   Cardiac Enzymes: No results for input(s): CKTOTAL, CKMB, CKMBINDEX,  TROPONINI in the last 168 hours. BNP (last 3 results)  Recent Labs  10/06/15 2300  BNP 96.5    ProBNP (last 3 results) No results for input(s): PROBNP in the last 8760 hours.  CBG:  Recent Labs Lab 11/03/2015 1430  GLUCAP 98    Recent Results (from the past 240 hour(s))  Culture, blood (routine x 2) Call MD if unable to obtain prior to antibiotics being given     Status: None (Preliminary result)   Collection Time: 11/03/15 12:05 AM  Result Value Ref Range Status   Specimen Description BLOOD LEFT ARM  Final   Special Requests BOTTLES DRAWN AEROBIC ONLY 5CC  Final   Culture   Final    NO GROWTH 4 DAYS Performed at [facility]    Report Status PENDING  Incomplete  Culture, blood (routine x 2) Call MD if unable to obtain prior to antibiotics being given     Status: None (Preliminary result)   Collection Time: 11/03/15 12:10 AM  Result Value Ref Range Status   Specimen Description BLOOD RIGHT ARM  Final   Special Requests BOTTLES DRAWN AEROBIC AND ANAEROBIC 5CC  Final   Culture   Final    NO GROWTH 4 DAYS Performed at [facility]    Report Status PENDING  Incomplete  Studies: No results found.  Scheduled Meds: . sodium chloride flush  3 mL Intravenous Q12H   Continuous Infusions: . HYDROmorphone 0.25 mg/hr (November 18, 2015 1345)    Active Problems:   Hypothyroidism   GBM (glioblastoma multiforme) (HCC)   Acute encephalopathy   Left-sided weakness   Physical deconditioning   Aspiration pneumonia ([place])    Time spent: 25 min 15 minutes spent in face-to-face discussion with his wife going over goals of care and transition to full comfort.    Kelvin Cellar  [facility] Pager [phone]. If 7PM-7AM, please contact night-coverage at [email or web address], password [facility] 2015/11/18, 2:44 PM  LOS: 5 days

## 2015-12-02 NOTE — Progress Notes (Signed)
Spoke with [employer] regarding getting son Perrion Neault here as soon as possible as patient has been deemed terminal.  If any questions may arise [employer] can be contacted at [phone].  Case [phone]

## 2015-12-02 NOTE — Progress Notes (Signed)
Spiritual Care Note  Spent time as pastoral presence at bedside with wife [name].  She articulates a deep understanding of having initiated the grieving process months/years ago, acknowledging that she is able to see [name]'s current situation as peaceful and lucky ("because he hasn't had seizures" or other dramatic suffering--"this [terminal decline] has happened so fast").  She is also working to process her own power and agency in terms of life and employment choices to establish as much financial (and home) stability for herself as possible.  [name] values chaplain support and is aware of ongoing [facility] chaplain and [facility] chaplain availability, both for her and for visiting family/friends.  Please page as needs arise.  Thank you.  [place], [place], [facility] M-F daytime pager [phone] [facility] 24/7 pager [phone]

## 2015-12-02 NOTE — Discharge Summary (Addendum)
Death Summary  Jake [name] [id number] DOB: 1955-11-30 DOA: 11/10/15  PCP: Truitt Merle, MD PCP/Office notified: Dr. Burr Medico aware  Admit date: Nov 10, 2015 Date of Death: 11-29-15  Final Diagnoses:  Active Problems:   Hypothyroidism   GBM (glioblastoma multiforme) (HCC)   Acute encephalopathy   Left-sided weakness   Physical deconditioning   Aspiration pneumonia (HCC)   Severe protein-calorie malnutrition (HCC)    History of present illness:  Jake [name] is a 60 y.o. 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 Patient responds when I call his name, but is unable to provide much history. His mother-in-law is at the bedside who provided some information. I also spoke to his wife over the phone. Patient is getting treatment for his brain tumor in the form of bevacizumab every 3 weeks, which was started on January 25. He is also on a clinical trial at [facility] with a vaccine, which started in December 2016. Patient was last hospitalized earlier this month for pneumonia. He was sent back to the skilled nursing facility in good condition. There has been a lot of back-and-forth regarding patient's CODE STATUS. The last note from that hospitalization was the patient was full code. Apparently over the last 2-3 days he has shown a decline in his mental status and weakness. He's had poor oral intake for the past many days. He also has a left-sided weakness due to his brain tumor which is also progressively gotten worse. History is limited.  Hospital Course:  Jake [name] is an unfortunate gentleman with a past medical history of glioblastoma multiform, status post subtotal right parietal tumor resection in 2015 with concurrent chemoradiation therapy, previous imaging studies showing evidence of disease progression, last saw his medical oncologist Dr.  Burr Medico on 10/18/2015 at which time his dexamethasone was decreased to 8 mg by mouth 3 times a day. He has had intermittent confusion/mental status changes however had been oriented to the balloon place. At baseline he has a poor functional status who had been residing at a skilled nursing facility. Physical therapy was recently discontinued given inability to participate. He was admitted to the medicine service on 11-10-15 presenting with functional decline and worsening mental status. He had a CT scan of brain that showed right occipital mass and extensive associated vasogenic edema tracking in the right hemisphere now associate with a 1.57 m right to left midline shift, representing disease progression from previous study. He has had a decline during this hospitalization becoming unresponsive. On 15-Nov-2015 he was transitioned to full comfort. Jake [name] passed away in the hospital on 2015/11/15, pronounced dead at 22:15. I was not present at the time of death.   Time:   SignedKelvin Cellar  [facility] 29-Nov-2015, 4:03 PM

## 2015-12-02 NOTE — Progress Notes (Signed)
CSW reviewed chart and noted that pt has transitioned to full comfort and anticipate hospital death.   Appreciate Chaplain and [place] SW support.   CSW available to provide support as requested.   Alison Murray, MSW, [place] [employer] [phone]

## 2015-12-02 NOTE — Progress Notes (Signed)
Pt's wife Jake [name] was bedside when [facility] arrived; holding pt's hand. She was tearful throughout most of our visit. She shared her husband's (of 4 years) journey w/cancer. She said she knows he's going to heaven but this is just hard. She said his son is on his way from [place] and pt has five children. Jake [name] indicated her mother has been a support for her. Jake [name] asked for prayer for their grieving. [place] provided grief support; empathic listening and prayer as requested. CH offered additional support if needed should her husband continue to decline and pass. Please page [phone] [address] Luther Redo, M.Div.   November 25, 2015 1800  Clinical Encounter Type  Visited With Family

## 2015-12-02 DEATH — deceased

## 2016-01-19 ENCOUNTER — Encounter: Payer: Self-pay | Admitting: Hematology

## 2016-01-19 NOTE — Progress Notes (Signed)
Fax sent 10/04/15 I sent to medical [facility]

## 2016-02-21 ENCOUNTER — Other Ambulatory Visit: Payer: Self-pay | Admitting: Nurse Practitioner

## 2016-11-01 IMAGING — CT CT HEAD W/O CM
3 of 4 series · 17 of 30 positions shown, 19 images · non-contrast
Comparison: 10/08/2015

CLINICAL DATA: Declining cognition. Occipital glioblastoma with
radiation therapy.

EXAM:
CT HEAD WITHOUT CONTRAST
TECHNIQUE: Contiguous axial images were obtained from the base of the skull
through the vertex without intravenous contrast.

[Series 2: head w/o · axial · non-contrast · 0.44mm/px · z∈[-195,-70]mm · 6 of 36 slices shown, 8 images (1 of 2)]
[im 6/36  brain]
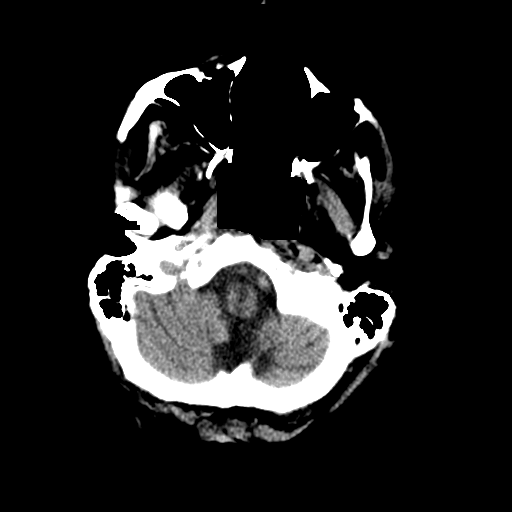
[im 6/36  bone]
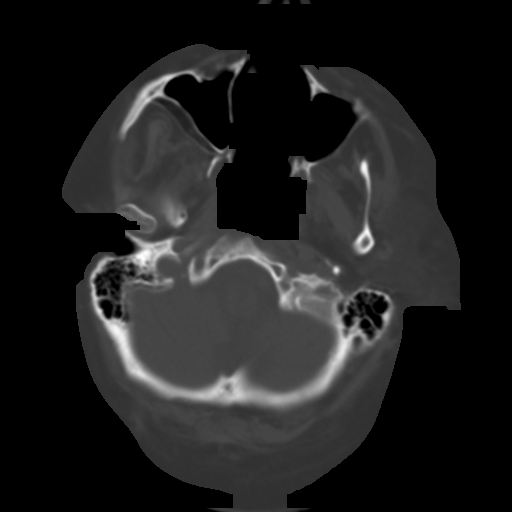
[im 11/36  brain]
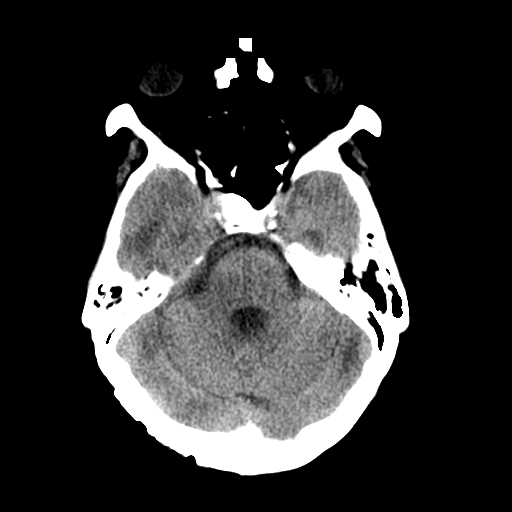
[im 16/36  brain]
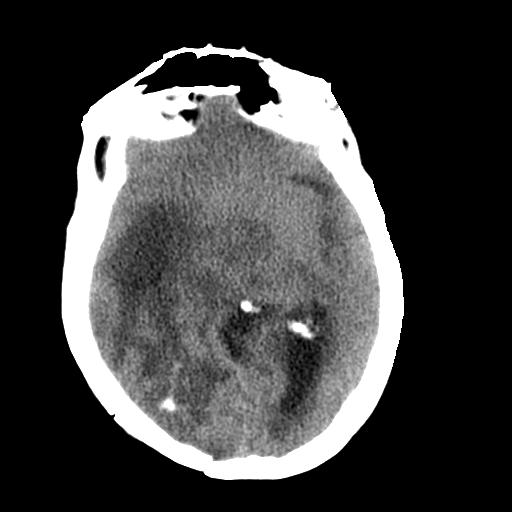
[im 21/36  brain]
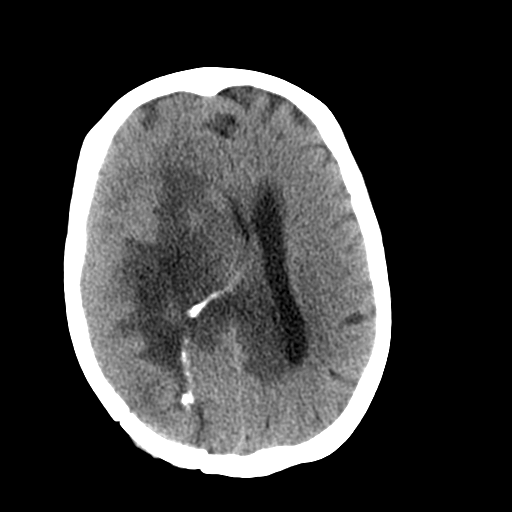
[im 26/36  brain]
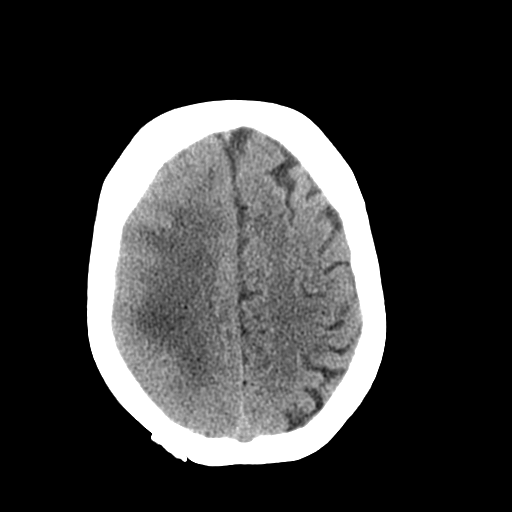
[im 26/36  bone]
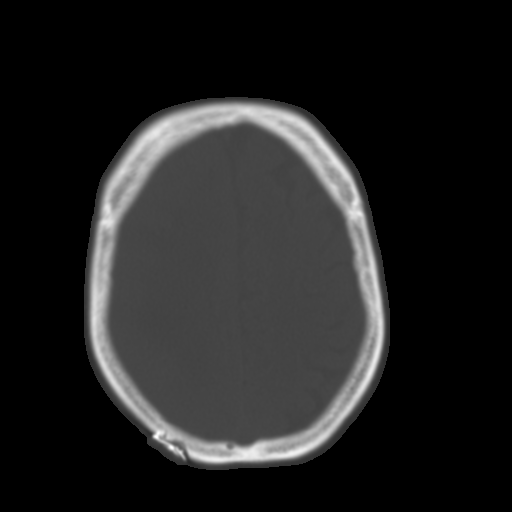
[im 31/36  brain]
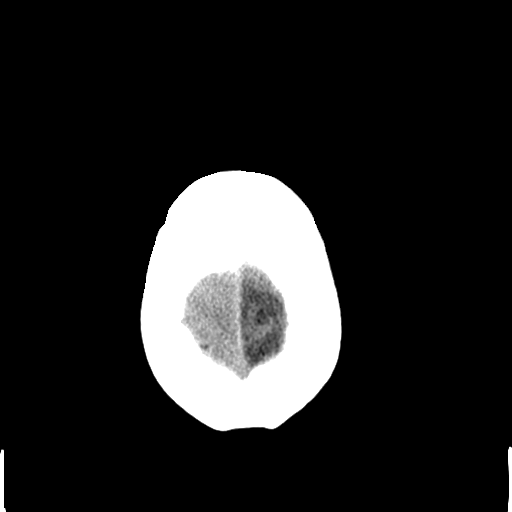

[Series 3: bone windows · axial · 0.44mm/px · z∈[-195,-70]mm · 6 of 36 slices shown]
[im 6/36  bone]
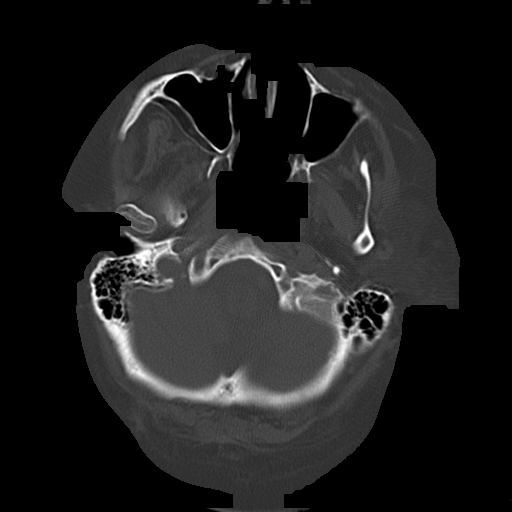
[im 11/36  bone]
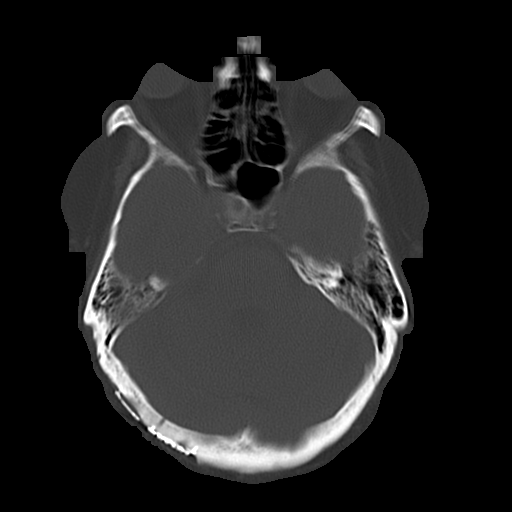
[im 16/36  bone]
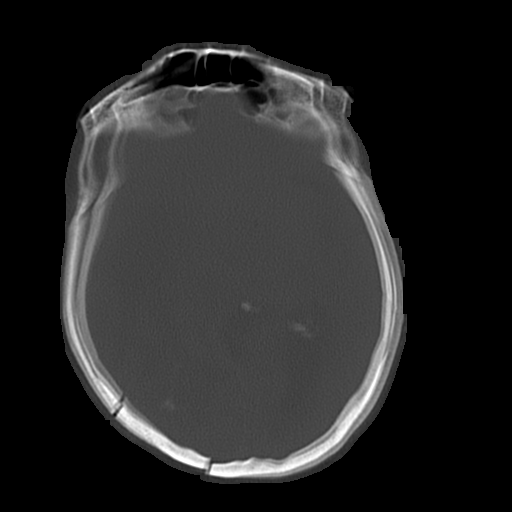
[im 21/36  bone]
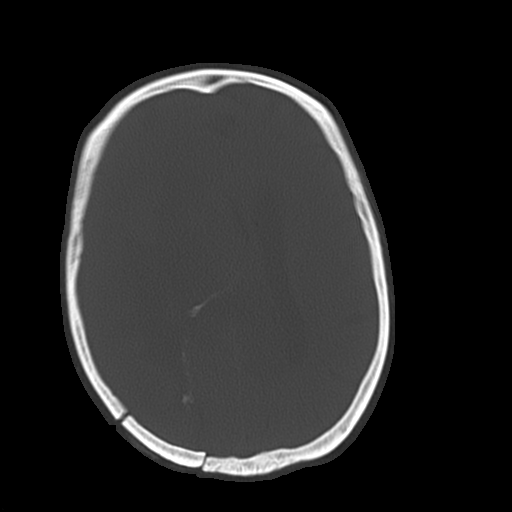
[im 26/36  bone]
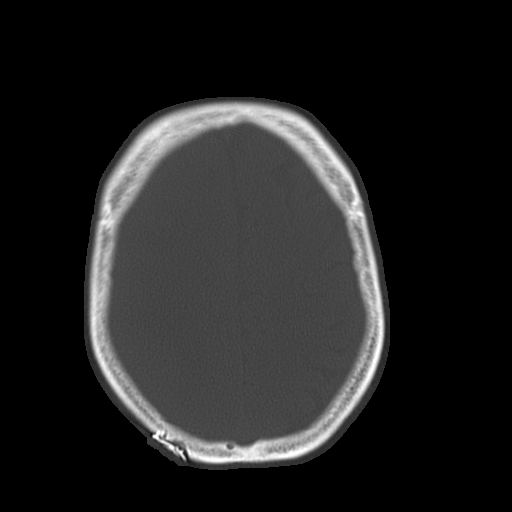
[im 31/36  bone]
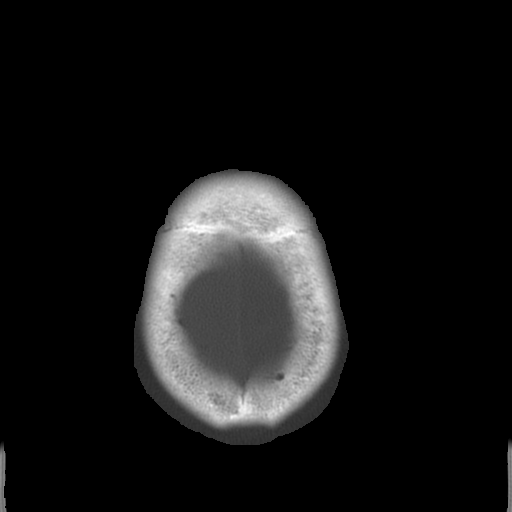

[Series 4: head w/o · axial · non-contrast · 0.44mm/px · z∈[-195,-80]mm · 5 of 35 slices shown (2 of 2)]
[im 6/35  brain]
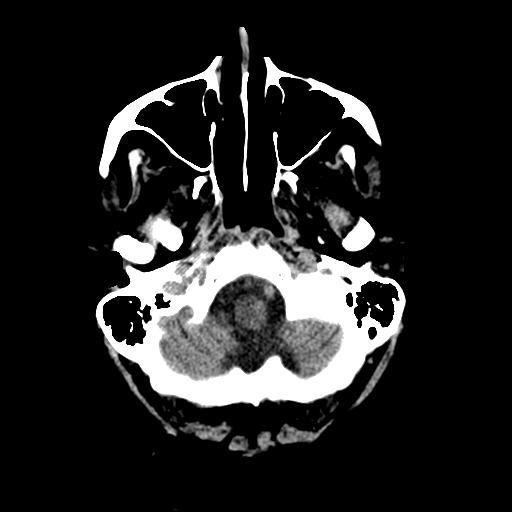
[im 12/35  brain]
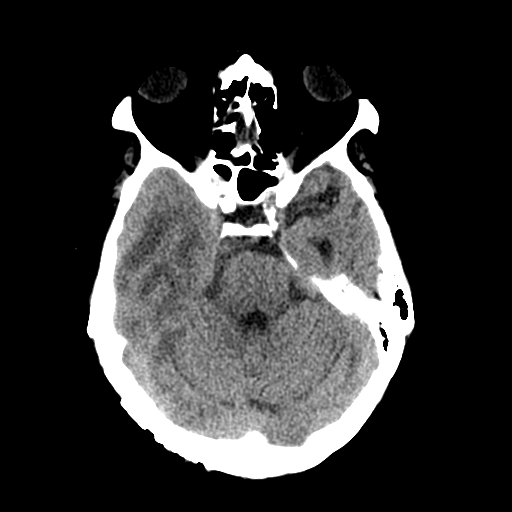
[im 18/35  brain]
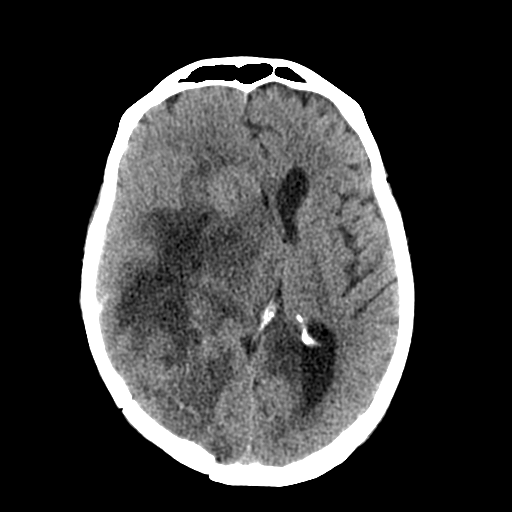
[im 23/35  brain]
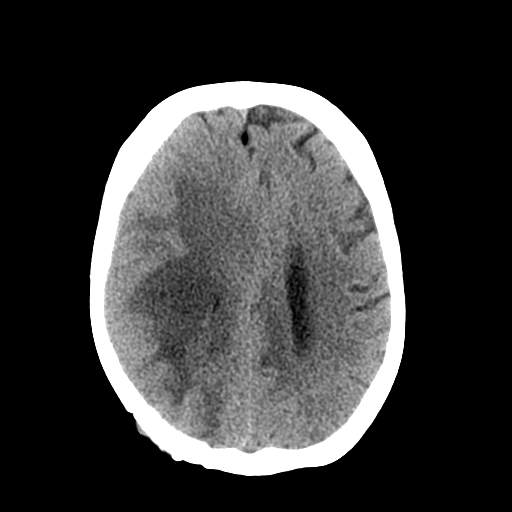
[im 29/35  brain]
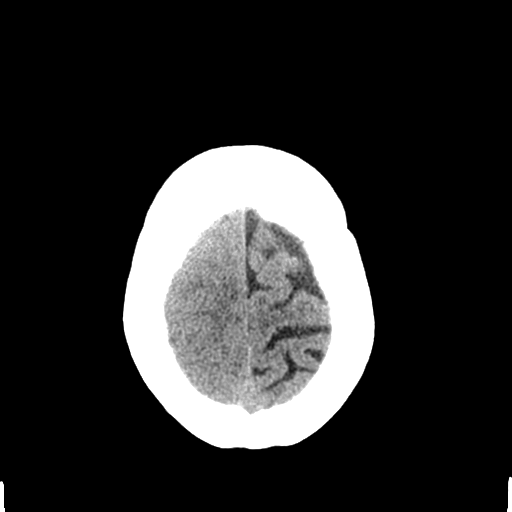

[17 of 30 positions shown; findings below may reference images not displayed]

FINDINGS: Right occipital mass with extensive hypodensity in the white matter
tracking in the right temporal, occipital, and parietal lobes and
into the right frontal lobe. The mass itself probably involves part
of the temporal lobe. Effacement of right-sided sulci; the mass
effect from the process causes 1.5 cm of right to left midline shift
(previously 1.3 cm) with effacement of the right lateral ventricle
and trapping of the left lateral ventricle with associated dilation
of the left temporal horn. There may be some slight uncal herniation
on the right side.

Right craniotomy noted. Abnormal hypodensity involving the splenium
of the corpus callosum observed crossing the midline to the left
side.

Acute on chronic right sphenoid sinusitis. There is atherosclerotic
calcification of the cavernous carotid arteries bilaterally. Frothy
air fluid level in a posterior right ethmoid air cell.
IMPRESSION: 1. The right occipital mass and extensive associated vasogenic edema
tracking in the right hemisphere is now associated with 1.5 cm right
to left midline shift, previously 1.3 cm. This causes effacement of
the right lateral ventricle, trapping of the left lateral ventricle,
and questionable uncal herniation. The mass also crosses the midline
in the splenium of the corpus callosum.
